# Patient Record
Sex: Female | Born: 1980
Health system: Southern US, Community
[De-identification: ages and names within clinical notes are randomized; demographics above are authoritative.]

## PROBLEM LIST (undated history)

## (undated) ENCOUNTER — Inpatient Hospital Stay (HOSPITAL_COMMUNITY): Payer: Self-pay

## (undated) ENCOUNTER — Emergency Department (HOSPITAL_COMMUNITY): Admission: EM | Payer: Medicaid Other | Source: Home / Self Care

## (undated) DIAGNOSIS — F32A Depression, unspecified: Secondary | ICD-10-CM

## (undated) DIAGNOSIS — M654 Radial styloid tenosynovitis [de Quervain]: Secondary | ICD-10-CM

## (undated) DIAGNOSIS — S61219A Laceration without foreign body of unspecified finger without damage to nail, initial encounter: Secondary | ICD-10-CM

## (undated) DIAGNOSIS — E039 Hypothyroidism, unspecified: Secondary | ICD-10-CM

## (undated) DIAGNOSIS — G4700 Insomnia, unspecified: Secondary | ICD-10-CM

## (undated) DIAGNOSIS — J069 Acute upper respiratory infection, unspecified: Secondary | ICD-10-CM

## (undated) DIAGNOSIS — J45909 Unspecified asthma, uncomplicated: Secondary | ICD-10-CM

## (undated) DIAGNOSIS — B019 Varicella without complication: Secondary | ICD-10-CM

## (undated) DIAGNOSIS — E559 Vitamin D deficiency, unspecified: Secondary | ICD-10-CM

## (undated) DIAGNOSIS — D649 Anemia, unspecified: Secondary | ICD-10-CM

## (undated) DIAGNOSIS — J339 Nasal polyp, unspecified: Secondary | ICD-10-CM

## (undated) DIAGNOSIS — O009 Unspecified ectopic pregnancy without intrauterine pregnancy: Secondary | ICD-10-CM

## (undated) DIAGNOSIS — G5600 Carpal tunnel syndrome, unspecified upper limb: Secondary | ICD-10-CM

## (undated) DIAGNOSIS — J189 Pneumonia, unspecified organism: Secondary | ICD-10-CM

## (undated) DIAGNOSIS — I509 Heart failure, unspecified: Secondary | ICD-10-CM

## (undated) DIAGNOSIS — O139 Gestational [pregnancy-induced] hypertension without significant proteinuria, unspecified trimester: Secondary | ICD-10-CM

## (undated) DIAGNOSIS — O1495 Unspecified pre-eclampsia, complicating the puerperium: Secondary | ICD-10-CM

## (undated) DIAGNOSIS — F419 Anxiety disorder, unspecified: Secondary | ICD-10-CM

## (undated) DIAGNOSIS — L509 Urticaria, unspecified: Secondary | ICD-10-CM

## (undated) DIAGNOSIS — F988 Other specified behavioral and emotional disorders with onset usually occurring in childhood and adolescence: Secondary | ICD-10-CM

## (undated) DIAGNOSIS — G43109 Migraine with aura, not intractable, without status migrainosus: Secondary | ICD-10-CM

## (undated) DIAGNOSIS — F329 Major depressive disorder, single episode, unspecified: Secondary | ICD-10-CM

## (undated) DIAGNOSIS — E038 Other specified hypothyroidism: Secondary | ICD-10-CM

## (undated) DIAGNOSIS — J811 Chronic pulmonary edema: Secondary | ICD-10-CM

## (undated) DIAGNOSIS — Z5189 Encounter for other specified aftercare: Secondary | ICD-10-CM

## (undated) DIAGNOSIS — O26899 Other specified pregnancy related conditions, unspecified trimester: Secondary | ICD-10-CM

## (undated) DIAGNOSIS — M5430 Sciatica, unspecified side: Secondary | ICD-10-CM

## (undated) HISTORY — DX: Other specified pregnancy related conditions, unspecified trimester: O26.899

## (undated) HISTORY — DX: Hypothyroidism, unspecified: E03.9

## (undated) HISTORY — DX: Urticaria, unspecified: L50.9

## (undated) HISTORY — PX: BREAST BIOPSY: SHX20

## (undated) HISTORY — DX: Major depressive disorder, single episode, unspecified: F32.9

## (undated) HISTORY — DX: Carpal tunnel syndrome, unspecified upper limb: G56.00

## (undated) HISTORY — PX: OTHER SURGICAL HISTORY: SHX169

## (undated) HISTORY — DX: Insomnia, unspecified: G47.00

## (undated) HISTORY — DX: Nasal polyp, unspecified: J33.9

## (undated) HISTORY — DX: Encounter for other specified aftercare: Z51.89

## (undated) HISTORY — DX: Acute upper respiratory infection, unspecified: J06.9

## (undated) HISTORY — DX: Vitamin D deficiency, unspecified: E55.9

## (undated) HISTORY — DX: Depression, unspecified: F32.A

## (undated) HISTORY — DX: Other specified hypothyroidism: E03.8

## (undated) HISTORY — DX: Varicella without complication: B01.9

## (undated) HISTORY — DX: Gestational (pregnancy-induced) hypertension without significant proteinuria, unspecified trimester: O13.9

## (undated) HISTORY — DX: Radial styloid tenosynovitis (de quervain): M65.4

## (undated) HISTORY — PX: DILATION AND CURETTAGE OF UTERUS: SHX78

## (undated) HISTORY — DX: Migraine with aura, not intractable, without status migrainosus: G43.109

---

## 2011-07-16 DIAGNOSIS — N644 Mastodynia: Secondary | ICD-10-CM | POA: Insufficient documentation

## 2011-07-31 DIAGNOSIS — F419 Anxiety disorder, unspecified: Secondary | ICD-10-CM | POA: Insufficient documentation

## 2011-07-31 DIAGNOSIS — E039 Hypothyroidism, unspecified: Secondary | ICD-10-CM | POA: Insufficient documentation

## 2011-07-31 DIAGNOSIS — F32A Depression, unspecified: Secondary | ICD-10-CM | POA: Insufficient documentation

## 2011-07-31 DIAGNOSIS — F329 Major depressive disorder, single episode, unspecified: Secondary | ICD-10-CM | POA: Insufficient documentation

## 2011-11-20 DIAGNOSIS — F909 Attention-deficit hyperactivity disorder, unspecified type: Secondary | ICD-10-CM | POA: Insufficient documentation

## 2011-11-20 DIAGNOSIS — D649 Anemia, unspecified: Secondary | ICD-10-CM | POA: Insufficient documentation

## 2012-08-22 ENCOUNTER — Encounter (HOSPITAL_COMMUNITY): Payer: Self-pay | Admitting: Emergency Medicine

## 2012-08-22 ENCOUNTER — Emergency Department (HOSPITAL_COMMUNITY)
Admission: EM | Admit: 2012-08-22 | Discharge: 2012-08-22 | Disposition: A | Discharge status: Home / Self Care | Payer: Self-pay | Attending: Emergency Medicine | Admitting: Emergency Medicine

## 2012-08-22 DIAGNOSIS — F411 Generalized anxiety disorder: Secondary | ICD-10-CM | POA: Insufficient documentation

## 2012-08-22 DIAGNOSIS — M791 Myalgia, unspecified site: Secondary | ICD-10-CM

## 2012-08-22 DIAGNOSIS — M62838 Other muscle spasm: Secondary | ICD-10-CM | POA: Insufficient documentation

## 2012-08-22 DIAGNOSIS — IMO0001 Reserved for inherently not codable concepts without codable children: Secondary | ICD-10-CM | POA: Insufficient documentation

## 2012-08-22 DIAGNOSIS — Z8659 Personal history of other mental and behavioral disorders: Secondary | ICD-10-CM | POA: Insufficient documentation

## 2012-08-22 DIAGNOSIS — R6884 Jaw pain: Secondary | ICD-10-CM | POA: Insufficient documentation

## 2012-08-22 DIAGNOSIS — R51 Headache: Secondary | ICD-10-CM | POA: Insufficient documentation

## 2012-08-22 DIAGNOSIS — M542 Cervicalgia: Secondary | ICD-10-CM

## 2012-08-22 DIAGNOSIS — M26629 Arthralgia of temporomandibular joint, unspecified side: Secondary | ICD-10-CM

## 2012-08-22 DIAGNOSIS — Z8739 Personal history of other diseases of the musculoskeletal system and connective tissue: Secondary | ICD-10-CM | POA: Insufficient documentation

## 2012-08-22 DIAGNOSIS — Z79899 Other long term (current) drug therapy: Secondary | ICD-10-CM | POA: Insufficient documentation

## 2012-08-22 DIAGNOSIS — M26609 Unspecified temporomandibular joint disorder, unspecified side: Secondary | ICD-10-CM | POA: Insufficient documentation

## 2012-08-22 DIAGNOSIS — M25519 Pain in unspecified shoulder: Secondary | ICD-10-CM | POA: Insufficient documentation

## 2012-08-22 DIAGNOSIS — M25512 Pain in left shoulder: Secondary | ICD-10-CM

## 2012-08-22 DIAGNOSIS — F17200 Nicotine dependence, unspecified, uncomplicated: Secondary | ICD-10-CM | POA: Insufficient documentation

## 2012-08-22 HISTORY — DX: Sciatica, unspecified side: M54.30

## 2012-08-22 HISTORY — DX: Anxiety disorder, unspecified: F41.9

## 2012-08-22 HISTORY — DX: Other specified behavioral and emotional disorders with onset usually occurring in childhood and adolescence: F98.8

## 2012-08-22 MED ORDER — NAPROXEN 500 MG PO TABS: 500.0000 mg | ORAL_TABLET | Freq: Two times a day (BID) | ORAL | Status: DC

## 2012-08-22 MED ORDER — CYCLOBENZAPRINE HCL 10 MG PO TABS: 10.0000 mg | ORAL_TABLET | Freq: Three times a day (TID) | ORAL | Status: DC | PRN

## 2012-08-22 MED ORDER — IBUPROFEN 800 MG PO TABS
800.0000 mg | ORAL_TABLET | Freq: Once | ORAL | Status: AC
  Administered 2012-08-22: 800 mg via ORAL
  Filled 2012-08-22: qty 1

## 2012-08-22 MED ORDER — DIAZEPAM 5 MG PO TABS
5.0000 mg | ORAL_TABLET | Freq: Once | ORAL | Status: AC
  Administered 2012-08-22: 5 mg via ORAL
  Filled 2012-08-22: qty 1

## 2012-08-22 MED ORDER — HYDROCODONE-ACETAMINOPHEN 5-325 MG PO TABS: 1.0000 | ORAL_TABLET | Freq: Four times a day (QID) | ORAL | Status: DC | PRN

## 2012-08-22 MED ORDER — OXYCODONE-ACETAMINOPHEN 5-325 MG PO TABS
1.0000 | ORAL_TABLET | Freq: Once | ORAL | Status: AC
  Administered 2012-08-22: 1 via ORAL
  Filled 2012-08-22: qty 1

## 2012-08-22 NOTE — ED Notes (Addendum)
Reports back of neck was hurting then radiated to left side of face,left jaw-can close left side of mouth due to pain, left back tooth,  left should and arm. Head feels like it weighs 100# points specifically area to back of neck calling it "the ball", painful to turn neck to left side, can not flex neck c/o increase pain hurting "the ball in back of neck"  to   heavy. Thought pain related to exercise doing  abdomine  crunches & lifting weights 3 weeks ago.

## 2012-08-22 NOTE — ED Provider Notes (Signed)
Medical screening examination/treatment/procedure(s) were performed by non-physician practitioner and as supervising physician I was immediately available for consultation/collaboration.  Doug Sou, MD 08/22/12 1710

## 2012-08-22 NOTE — ED Provider Notes (Signed)
History     CSN: [id number]  Arrival date & time 08/22/12  1449   First MD Initiated Contact with Patient 08/22/12 1531      Chief Complaint  Patient presents with  . Neck Pain  . Arm Pain  . Jaw Pain  . Headache    (Consider location/radiation/quality/duration/timing/severity/associated sxs/prior treatment) HPI Comments: Esma Kilts is a 32 y.o. female with a history of anxiety presents emergency department complaining of left neck, shoulder, back and jaw pain.  Onset of symptoms began approximately 2-3 weeks ago and has been gradually worsening.  Patient states that she recently just started working out and did a series of chest and back exercises prior to the onset of her presentation.  Patient denies any dental pain, fevers, night sweats, chills, nausea, vomiting, dizziness, diaphoresis, chest pain, shortness of breath or difficulty breathing, numbness, tingling or weakness of extremities, recent travel, or  Rash.   Patient is a 32 y.o. female presenting with neck pain, arm pain, and headaches. The history is provided by the patient.  Neck Pain Associated symptoms: headaches   Associated symptoms: no chest pain, no fever, no numbness and no weakness   Arm Pain Associated symptoms include headaches, myalgias and neck pain. Pertinent negatives include no chest pain, chills, congestion, coughing, diaphoresis, fever, numbness or weakness.  Headache Associated symptoms: myalgias and neck pain   Associated symptoms: no congestion, no cough, no dizziness, no fever and no numbness     Past Medical History  Diagnosis Date  . Anxiety   . ADD (attention deficit disorder)   . Sciatic pain     History reviewed. No pertinent past surgical history.  History reviewed. No pertinent family history.  History  Substance Use Topics  . Smoking status: Current Some Day Smoker    Types: Cigarettes  . Smokeless tobacco: Not on file  . Alcohol Use: Yes    OB History   Grav Para Term  Preterm Abortions TAB SAB Ect Mult Living                  Review of Systems  Constitutional: Negative for fever, chills, diaphoresis, activity change and appetite change.  HENT: Positive for neck pain. Negative for congestion.   Eyes: Negative for visual disturbance.  Respiratory: Negative for cough and shortness of breath.   Cardiovascular: Negative for chest pain and leg swelling.  Genitourinary: Negative for dysuria, urgency and frequency.  Musculoskeletal: Positive for myalgias.  Skin: Negative for color change and wound.  Neurological: Positive for headaches. Negative for dizziness, syncope, weakness, light-headedness and numbness.  Psychiatric/Behavioral: Negative for confusion.  All other systems reviewed and are negative.    Allergies  Penicillins and Sulfa antibiotics  Home Medications   Current Outpatient Rx  Name  Route  Sig  Dispense  Refill  . ALPRAZolam (XANAX) 1 MG tablet   Oral   Take 0.5-1 mg by mouth 3 (three) times daily as needed for anxiety.         . Aspirin-Salicylamide-Caffeine (BC HEADACHE POWDER PO)   Oral   Take 1 packet by mouth daily as needed (for pain.).         Marland Kitchen omega-3 acid ethyl esters (LOVAZA) 1 G capsule   Oral   Take 2 g by mouth daily.         . traZODone (DESYREL) 150 MG tablet   Oral   Take 300 mg by mouth at bedtime.  BP 122/73  Pulse 77  Temp(Src) 98.1 F (36.7 C) (Oral)  Resp 16  SpO2 95%  LMP 08/08/2012  Physical Exam  Nursing note and vitals reviewed. Constitutional: She appears well-developed and well-nourished. No distress.  HENT:  Head: Normocephalic and atraumatic.  Opens and closes mouth without difficulty, mild left sided TMJ ttp, Normal dentition  Eyes: Conjunctivae and EOM are normal.  Neck: Normal range of motion. Neck supple.    No nuchal rigidity or spinous process tenderness  Cardiovascular:  Intact distal pulses, capillary refill < 3 seconds  Musculoskeletal:       Left  shoulder: She exhibits tenderness and spasm. She exhibits no bony tenderness, no crepitus and normal strength.       Arms: All other extremities with normal ROM  Neurological:  CN II-XII intact w good coordination. Strength equal bilaterally. No sensory deficit.   Skin: She is not diaphoretic.  Skin intact, no tenting    ED Course  Procedures (including critical care time)  Labs Reviewed - No data to display No results found.   No diagnosis found.    MDM  Patient reports emergency department complaining of musculoskeletal pain of the left neck shoulder or and scapular region extending to left TMJ.  Onset of symptoms occurred status post weightlifting. Exam consistent w significant muscular tenderness.  Pt denies any fevers or recent sickness. Presentation non concerning for closed head injury or meningitis. Strict return precautions discussed. Explained conservative home therapies and advised PCP follow up.         Jaci Carrel, [place] 08/22/12 1623

## 2012-08-22 NOTE — ED Notes (Addendum)
Patient reports left neck pain since Wednesday.  Patient reports left shoulder pain, left-sided headache, and left jaw pain since last night.  Patient denies chest pain, fevers, N/V, diaphoresis, dizziness.

## 2014-12-29 ENCOUNTER — Encounter: Payer: Self-pay | Admitting: Family

## 2014-12-29 ENCOUNTER — Other Ambulatory Visit (INDEPENDENT_AMBULATORY_CARE_PROVIDER_SITE_OTHER): Payer: 59

## 2014-12-29 ENCOUNTER — Ambulatory Visit (INDEPENDENT_AMBULATORY_CARE_PROVIDER_SITE_OTHER): Payer: 59 | Admitting: Family

## 2014-12-29 VITALS — BP 122/84 | HR 102 | Temp 98.0°F | Resp 18 | Ht 63.0 in | Wt 147.8 lb

## 2014-12-29 DIAGNOSIS — E559 Vitamin D deficiency, unspecified: Secondary | ICD-10-CM | POA: Insufficient documentation

## 2014-12-29 DIAGNOSIS — F411 Generalized anxiety disorder: Secondary | ICD-10-CM

## 2014-12-29 DIAGNOSIS — N926 Irregular menstruation, unspecified: Secondary | ICD-10-CM | POA: Diagnosis not present

## 2014-12-29 DIAGNOSIS — F909 Attention-deficit hyperactivity disorder, unspecified type: Secondary | ICD-10-CM

## 2014-12-29 DIAGNOSIS — J339 Nasal polyp, unspecified: Secondary | ICD-10-CM | POA: Diagnosis not present

## 2014-12-29 DIAGNOSIS — F988 Other specified behavioral and emotional disorders with onset usually occurring in childhood and adolescence: Secondary | ICD-10-CM | POA: Insufficient documentation

## 2014-12-29 LAB — CBC
HCT: 37.5 % (ref 36.0–46.0)
HEMOGLOBIN: 12.9 g/dL (ref 12.0–15.0)
MCHC: 34.2 g/dL (ref 30.0–36.0)
MCV: 85.7 fl (ref 78.0–100.0)
Platelets: 343 10*3/uL (ref 150.0–400.0)
RBC: 4.38 Mil/uL (ref 3.87–5.11)
RDW: 13.6 % (ref 11.5–15.5)
WBC: 9.1 10*3/uL (ref 4.0–10.5)

## 2014-12-29 LAB — IBC PANEL
IRON: 110 ug/dL (ref 42–145)
Saturation Ratios: 26.4 % (ref 20.0–50.0)
Transferrin: 298 mg/dL (ref 212.0–360.0)

## 2014-12-29 LAB — TESTOSTERONE: TESTOSTERONE: 23.57 ng/dL (ref 15.00–40.00)

## 2014-12-29 MED ORDER — AMPHETAMINE-DEXTROAMPHETAMINE 30 MG PO TABS: 15.0000 mg | ORAL_TABLET | Freq: Three times a day (TID) | ORAL | Status: DC

## 2014-12-29 MED ORDER — OMEPRAZOLE 20 MG PO CPDR: 20.0000 mg | DELAYED_RELEASE_CAPSULE | Freq: Every day | ORAL | Status: DC

## 2014-12-29 MED ORDER — ALPRAZOLAM 1 MG PO TABS: 0.5000 mg | ORAL_TABLET | Freq: Three times a day (TID) | ORAL | Status: DC | PRN

## 2014-12-29 NOTE — Assessment & Plan Note (Signed)
Stable with current dosage of Adderall. Takes the medication as prescribed and denies adverse side effects. Continue current dosage of Adderall 15 mg 3 times daily. Follow-up in one month. [place] controlled substance database checked in prescription dated accordingly. Controlled substance contract signed and urine drug screen completed.

## 2014-12-29 NOTE — Progress Notes (Signed)
Subjective:    Patient ID: Cynthia Townsend, female    DOB: [birth year], 34 y.o.   MRN: [id number]  Chief Complaint  Patient presents with  . Establish Care    has been dx with bilateral nose polyps that she would like checked out, she feels like she is getting fluid in her ears, also was told to get checked for PCOS, vitamin D defiencency?    HPI:  Cynthia Townsend is a 34 y.o. female with a PMH of anxiety, depression, attention deficit disorder, insomnia, nasal polyps, and thyroid disease who presents today for an office visit to establish care.   1.) Nasal polyps - previously diagnosed with nasal polyps about 1 year ago and has tried the modifying factor of flonase which did not help. Associated symptoms of congestion and ear pain. Course of the polyps has stayed about the same.   2.) PCOS - Associated symptoms of irregular period and severe cramps which is worse around the first 2 days of her menstrual cycle. Also notes Denies hirtuism. Reports previous A1c and TSH were normal. Was scheduled to get an ultrasound, however, secondary to insurance was unable to complete.   3.) Vitamin D Deficiency - previously diagnosed with Vitamin D deficiency and was previously taking 2000 mg daily which she has currently stopped. Questioning if she needs to continue taking the supplements.   4.) ADD - Stable with current dosage of Adderall. Takes medication as prescribed and denies adverse effects of medicaiton. Reports sleeping okay with trazadone. Denies changes in weight or appetite. Denies chest pain, shortness of breath, or heart palpitations.  5.) Anxiety - stable with current dose of Xanax. Takes medications prescribed and denies adverse side effects. Requesting medication refill.  Allergies  Allergen Reactions  . Penicillins Shortness Of Breath and Swelling  . Sulfa Antibiotics Hives     Outpatient Prescriptions Prior to Visit  Medication Sig Dispense Refill  . Aspirin-Salicylamide-Caffeine  (BC HEADACHE POWDER PO) Take 1 packet by mouth daily as needed (for pain.).    Marland Kitchen traZODone (DESYREL) 150 MG tablet Take 300 mg by mouth at bedtime.    . ALPRAZolam (XANAX) 1 MG tablet Take 0.5-1 mg by mouth 3 (three) times daily as needed for anxiety.    . cyclobenzaprine (FLEXERIL) 10 MG tablet Take 1 tablet (10 mg total) by mouth 3 (three) times daily as needed for muscle spasms. 30 tablet 0  . HYDROcodone-acetaminophen (NORCO/VICODIN) 5-325 MG per tablet Take 1 tablet by mouth every 6 (six) hours as needed for pain. 15 tablet 0  . naproxen (NAPROSYN) 500 MG tablet Take 1 tablet (500 mg total) by mouth 2 (two) times daily. 30 tablet 0  . omega-3 acid ethyl esters (LOVAZA) 1 G capsule Take 2 g by mouth daily.     No facility-administered medications prior to visit.     Past Medical History  Diagnosis Date  . Anxiety   . ADD (attention deficit disorder)   . Sciatic pain   . Chicken pox   . Depression   . Subclinical hypothyroidism   . Insomnia   . Vitamin D deficiency   . Nasal polyps      Past Surgical History  Procedure Laterality Date  . Breast biopsy      benign     Family History  Problem Relation Age of Onset  . Alcohol abuse Mother   . Lung cancer Mother   . Hyperlipidemia Mother   . Heart disease Mother   . Mental illness Mother   .  Hyperthyroidism Mother   . Hyperlipidemia Father   . Heart disease Father   . Transient ischemic attack Father   . Heart disease Sister   . Mental illness Sister   . Heart disease Maternal Uncle   . Rheum arthritis Maternal Grandmother      History   Social History  . Marital Status: Single    Spouse Name: N/A  . Number of Children: 0  . Years of Education: 16   Occupational History  . 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 Smoking status: Former Smoker    Types: Cigarettes  . Smokeless tobacco: Never Used  . Alcohol Use: 0.6 oz/week    1 Shots of liquor per week  . Drug Use: No  . Sexual Activity:  Yes    Birth Control/ Protection: None   Other Topics Concern  . Not on file   Social History Narrative   Fun: Hiking, Zumba, sleep   Denies religious beliefs effecting health care.   Denies abuse and feels safe at home.       Review of Systems  Constitutional: Negative for fever, chills, activity change, appetite change and unexpected weight change.  Respiratory: Negative for chest tightness.   Cardiovascular: Negative for chest pain, palpitations and leg swelling.  Endocrine: Negative for cold intolerance and heat intolerance.  Genitourinary: Positive for menstrual problem.  Neurological: Negative for headaches.  Psychiatric/Behavioral: Negative for sleep disturbance and decreased concentration.      Objective:    BP 122/84 mmHg  Pulse 102  Temp(Src) 98 F (36.7 C) (Oral)  Resp 18  Ht  (1.6 m)  Wt 147 lb 12.8 oz (67.042 kg)  BMI 26.19 kg/m2  SpO2 97% Nursing note and vital signs reviewed.  Physical Exam  Constitutional: She is oriented to person, place, and time. She appears well-developed and well-nourished. No distress.  HENT:  Nose: Nose normal. No nasal deformity or septal deviation.  Cardiovascular: Normal rate, regular rhythm, normal heart sounds and intact distal pulses.   Pulmonary/Chest: Effort normal and breath sounds normal.  Neurological: She is alert and oriented to person, place, and time.  Skin: Skin is warm and dry.  Psychiatric: She has a normal mood and affect. Her behavior is normal. Judgment and thought content normal.       Assessment & Plan:   Problem List Items Addressed This Visit      Other   Irregular menstrual cycle - Primary    Patient with irregular menstrual cycle and previously began workup for her polycystic ovarian syndrome. Indicates her A1c and thyroid were both within normal limit, and was scheduled for an ultrasound which was never completed secondary to insurance. Obtain testosterone, iron panel, and CBC. Refer to  gynecology for ultrasound and follow-up of potential PCOS and menorrhagia.      Relevant Orders   Ambulatory referral to Gynecology   Testosterone (Completed)   CBC (Completed)   IBC panel (Completed)   Nasal polyps    Mild inflammation noted. Treat conservatively with over-the-counter medications including Sudafed and a second generation antihistamine. Discussed potential for reflux causing her congestion. Start omeprazole to rule out reflux. May trial alternative nasal corticosteroid if not controlled with over-the-counter medications. If symptoms fail to improve with current treatment plan referred to ear nose and throat.      Relevant Medications   omeprazole (PRILOSEC) 20 MG capsule   ADD (attention deficit disorder)    Stable with current  dosage of Adderall. Takes the medication as prescribed and denies adverse side effects. Continue current dosage of Adderall 15 mg 3 times daily. Follow-up in one month. [place] controlled substance database checked in prescription dated accordingly. Controlled substance contract signed and urine drug screen completed.      Relevant Medications   amphetamine-dextroamphetamine (ADDERALL) 30 MG tablet   Vitamin D deficiency    Previous history of vitamin D deficiency and taking oral vitamin D. Obtain vitamin D level to determine current status and need for supplementation.      Relevant Orders   Vitamin D 1,25 dihydroxy   Generalized anxiety disorder    Stable with current dose of Xanax and reports the medication helps. Denies adverse side effects. Continue current dosage of Xanax 0.5 mg 3 times daily as needed.      Relevant Medications   ALPRAZolam (XANAX) 1 MG tablet

## 2014-12-29 NOTE — Assessment & Plan Note (Signed)
Stable with current dose of Xanax and reports the medication helps. Denies adverse side effects. Continue current dosage of Xanax 0.5 mg 3 times daily as needed.

## 2014-12-29 NOTE — Assessment & Plan Note (Signed)
Mild inflammation noted. Treat conservatively with over-the-counter medications including Sudafed and a second generation antihistamine. Discussed potential for reflux causing her congestion. Start omeprazole to rule out reflux. May trial alternative nasal corticosteroid if not controlled with over-the-counter medications. If symptoms fail to improve with current treatment plan referred to ear nose and throat.

## 2014-12-29 NOTE — Progress Notes (Signed)
Pre visit review using our clinic review tool, if applicable. No additional management support is needed unless otherwise documented below in the visit note. 

## 2014-12-29 NOTE — Assessment & Plan Note (Signed)
Patient with irregular menstrual cycle and previously began workup for her polycystic ovarian syndrome. Indicates her A1c and thyroid were both within normal limit, and was scheduled for an ultrasound which was never completed secondary to insurance. Obtain testosterone, iron panel, and CBC. Refer to gynecology for ultrasound and follow-up of potential PCOS and menorrhagia.

## 2014-12-29 NOTE — Patient Instructions (Addendum)
Thank you for choosing [facility].  Summary/Instructions:  Your prescription(s) have been submitted to your pharmacy or been printed and provided for you. Please take as directed and contact our office if you believe you are having problem(s) with the medication(s) or have any questions.  Please stop by the lab on the basement level of the building for your blood work. Your results will be released to MyChart (or called to you) after review, usually within 72 hours after test completion. If any changes need to be made, you will be notified at that same time.  Referrals have been made during this visit. You should expect to hear back from our schedulers in about 7-10 days in regards to establishing an appointment with the specialists we discussed.   If your symptoms worsen or fail to improve, please contact our office for further instruction, or in case of emergency go directly to the emergency room at the closest medical facility.   Polycystic Ovarian Syndrome Polycystic ovarian syndrome (PCOS) is a common hormonal disorder among women of reproductive age. Most women with PCOS grow many small cysts on their ovaries. PCOS can cause problems with your periods and make it difficult to get pregnant. It can also cause an increased risk of miscarriage with pregnancy. If left untreated, PCOS can lead to serious health problems, such as diabetes and heart disease. CAUSES The cause of PCOS is not fully understood, but genetics may be a factor. SIGNS AND SYMPTOMS   Infrequent or no menstrual periods.   Inability to get pregnant (infertility) because of not ovulating.   Increased growth of hair on the face, chest, stomach, back, thumbs, thighs, or toes.   Acne, oily skin, or dandruff.   Pelvic pain.   Weight gain or obesity, usually carrying extra weight around the waist.   Type 2 diabetes.   High cholesterol.   High blood pressure.   Female-pattern baldness or thinning  hair.   Patches of thickened and dark brown or black skin on the neck, arms, breasts, or thighs.   Tiny excess flaps of skin (skin tags) in the armpits or neck area.   Excessive snoring and having breathing stop at times while asleep (sleep apnea).   Deepening of the voice.   Gestational diabetes when pregnant.  DIAGNOSIS  There is no single test to diagnose PCOS.   Your health care provider will:   Take a medical history.   Perform a pelvic exam.   Have ultrasonography done.   Check your female and female hormone levels.   Measure glucose or sugar levels in the blood.   Do other blood tests.   If you are producing too many female hormones, your health care provider will make sure it is from PCOS. At the physical exam, your health care provider will want to evaluate the areas of increased hair growth. Try to allow natural hair growth for a few days before the visit.   During a pelvic exam, the ovaries may be enlarged or swollen because of the increased number of small cysts. This can be seen more easily by using vaginal ultrasonography or screening to examine the ovaries and lining of the uterus (endometrium) for cysts. The uterine lining may become thicker if you have not been having a regular period.  TREATMENT  Because there is no cure for PCOS, it needs to be managed to prevent problems. Treatments are based on your symptoms. Treatment is also based on whether you want to have a baby or  whether you need contraception.  Treatment may include:   Progesterone hormone to start a menstrual period.   Birth control pills to make you have regular menstrual periods.   Medicines to make you ovulate, if you want to get pregnant.   Medicines to control your insulin.   Medicine to control your blood pressure.   Medicine and diet to control your high cholesterol and triglycerides in your blood.  Medicine to reduce excessive hair growth.  Surgery, making small  holes in the ovary, to decrease the amount of female hormone production. This is done through a long, lighted tube (laparoscope) placed into the pelvis through a tiny incision in the lower abdomen.  HOME CARE INSTRUCTIONS  Only take over-the-counter or prescription medicine as directed by your health care provider.  Pay attention to the foods you eat and your activity levels. This can help reduce the effects of PCOS.  Keep your weight under control.  Eat foods that are low in carbohydrate and high in fiber.  Exercise regularly. SEEK MEDICAL CARE IF:  Your symptoms do not get better with medicine.  You have new symptoms. Document Released: 09/20/2004 Document Revised: 03/17/2013 Document Reviewed: 11/12/2012 [facility] Patient Information 2015 [place], [place]. This information is not intended to replace advice given to you by your health care provider. Make sure you discuss any questions you have with your health care provider.

## 2014-12-29 NOTE — Assessment & Plan Note (Signed)
Previous history of vitamin D deficiency and taking oral vitamin D. Obtain vitamin D level to determine current status and need for supplementation.

## 2014-12-30 ENCOUNTER — Ambulatory Visit: Payer: Self-pay | Admitting: Family

## 2014-12-30 ENCOUNTER — Telehealth: Payer: Self-pay | Admitting: Family

## 2014-12-30 NOTE — Telephone Encounter (Signed)
Received records from [facility] forwarded to Dr. Carver Fila 56 pages [id number].

## 2015-01-01 LAB — VITAMIN D 1,25 DIHYDROXY
VITAMIN D 1, 25 (OH) TOTAL: 27 pg/mL (ref 18–72)
VITAMIN D3 1, 25 (OH): 27 pg/mL
Vitamin D2 1, 25 (OH)2: <8 pg/mL

## 2015-01-02 ENCOUNTER — Telehealth: Payer: Self-pay | Admitting: Family

## 2015-01-02 NOTE — Telephone Encounter (Signed)
Please inform patient that her lab work shows that her vitamin D, iron level, white/red blood cells, and testosterone are all within normal limits. Please ensure she has a follow-up with gynecology for an ultrasound to determine if she has PCOS.

## 2015-01-03 NOTE — Telephone Encounter (Signed)
LVM for pt to call back.

## 2015-01-03 NOTE — Telephone Encounter (Signed)
Pt aware of results 

## 2015-01-12 ENCOUNTER — Telehealth: Payer: Self-pay | Admitting: Family

## 2015-01-12 MED ORDER — TRAZODONE HCL 150 MG PO TABS: 300.0000 mg | ORAL_TABLET | Freq: Every day | ORAL | Status: DC

## 2015-01-12 NOTE — Telephone Encounter (Signed)
Patient needs trazodone sent to [facility] outpatient pharmacy

## 2015-01-12 NOTE — Telephone Encounter (Signed)
Trazodone refill completed as it is not a controlled substance. Please inform patient however that we will not be able to refill her Xanax or Adderall secondary to the positive drug test. We are more than happy to take care of her other primary care needs.

## 2015-01-12 NOTE — Telephone Encounter (Signed)
Pts drug test came back positive for THC and morphine.

## 2015-01-13 MED ORDER — TRAZODONE HCL 150 MG PO TABS: 300.0000 mg | ORAL_TABLET | Freq: Every day | ORAL | Status: DC

## 2015-01-13 NOTE — Telephone Encounter (Signed)
Patient states she admits to taking the [facility] but she states she should not have tested positive for morphine.  Patient is requesting to test again.  Did inform patient that Trazodone will get resent to [facility] outpatient.

## 2015-01-16 NOTE — Telephone Encounter (Signed)
That is fine. We will continue to prescribe at this point, however she will need drug tests for refills.

## 2015-01-17 ENCOUNTER — Telehealth: Payer: Self-pay | Admitting: Family

## 2015-01-17 NOTE — Telephone Encounter (Signed)
Patient seen greg as a new patient the other day and she was referred to [facility] and has an appt there on 8/25. She just found out she is pregnant and is wondering if she should try to get into there sooner and should she keep taking her meds. Please call her

## 2015-01-17 NOTE — Telephone Encounter (Signed)
Please inform patient that she needs to stop taking the Adderall and Xanax and start taking a pre-natal vitamin if she has not already done so. She should be fine to follow up with [facility] on 8/25 as a new OB appointment.

## 2015-01-18 NOTE — Telephone Encounter (Signed)
Cynthia Townsend spoke with pt.

## 2015-01-18 NOTE — Telephone Encounter (Signed)
Left voicemail to return call. 

## 2015-02-15 LAB — OB RESULTS CONSOLE GC/CHLAMYDIA
CHLAMYDIA, DNA PROBE: NEGATIVE
Gonorrhea: NEGATIVE

## 2015-02-15 LAB — OB RESULTS CONSOLE ANTIBODY SCREEN: ANTIBODY SCREEN: NEGATIVE

## 2015-02-15 LAB — OB RESULTS CONSOLE ABO/RH: RH Type: NEGATIVE

## 2015-02-15 LAB — OB RESULTS CONSOLE HEPATITIS B SURFACE ANTIGEN: HEP B S AG: NEGATIVE

## 2015-02-15 LAB — OB RESULTS CONSOLE HIV ANTIBODY (ROUTINE TESTING): HIV: NONREACTIVE

## 2015-02-15 LAB — OB RESULTS CONSOLE RUBELLA ANTIBODY, IGM: RUBELLA: IMMUNE

## 2015-02-15 LAB — OB RESULTS CONSOLE RPR: RPR: NONREACTIVE

## 2015-03-30 ENCOUNTER — Ambulatory Visit: Payer: Self-pay | Admitting: Family Medicine

## 2015-04-06 ENCOUNTER — Encounter: Payer: Self-pay | Admitting: Family

## 2015-04-06 ENCOUNTER — Ambulatory Visit (INDEPENDENT_AMBULATORY_CARE_PROVIDER_SITE_OTHER): Payer: 59 | Admitting: Family

## 2015-04-06 VITALS — BP 122/80 | HR 95 | Temp 98.3°F | Ht 63.0 in | Wt 182.8 lb

## 2015-04-06 DIAGNOSIS — M659 Synovitis and tenosynovitis, unspecified: Secondary | ICD-10-CM

## 2015-04-06 DIAGNOSIS — M6588 Other synovitis and tenosynovitis, other site: Secondary | ICD-10-CM | POA: Diagnosis not present

## 2015-04-06 NOTE — Progress Notes (Signed)
Subjective:    Patient ID: Cynthia Townsend, female    DOB: 05/25/1981, 34 y.o.   MRN: [id number]  Chief Complaint  Patient presents with  . Joint Pain    Bilateral thumbs    HPI:  Cynthia Townsend is a 34 y.o. female who  has a past medical history of Anxiety; ADD (attention deficit disorder); Sciatic pain; Chicken pox; Depression; Subclinical hypothyroidism; Insomnia; Vitamin D deficiency; and Nasal polyps. and presents today for an acute office visit.   This is a new problem. Associated symptom of pain located in her bilateral wrists that has been going on for about 2 weeks and she has also had carpal tunnel issues that has been going on for about 1 month. Modifying factors include cock up wrist splints which help with the numbness and tingling. Pain is described as sharp and very achy. Denies any trauma or sounds/sesnations heard or felt. Currently about 4 months pregnant.  Allergies  Allergen Reactions  . Penicillins Shortness Of Breath and Swelling  . Sulfa Antibiotics Hives     Current Outpatient Prescriptions on File Prior to Visit  Medication Sig Dispense Refill  . Acetaminophen-Caff-Pyrilamine (MIDOL COMPLETE PO) Take by mouth as needed.    . ALPRAZolam (XANAX) 1 MG tablet Take 0.5 tablets (0.5 mg total) by mouth 3 (three) times daily as needed for anxiety. (Patient not taking: Reported on 04/06/2015) 45 tablet 0  . amphetamine-dextroamphetamine (ADDERALL) 30 MG tablet Take 0.5 tablets by mouth 3 (three) times daily. (Patient not taking: Reported on 04/06/2015) 45 tablet 0  . Aspirin-Salicylamide-Caffeine (BC HEADACHE POWDER PO) Take 1 packet by mouth daily as needed (for pain.).    Marland Kitchen bismuth subsalicylate (PEPTO BISMOL) 262 MG/15ML suspension Take 30 mLs by mouth as needed.    Marland Kitchen omeprazole (PRILOSEC) 20 MG capsule Take 1 capsule (20 mg total) by mouth daily. (Patient not taking: Reported on 04/06/2015) 14 capsule 0  . traZODone (DESYREL) 150 MG tablet Take 2 tablets (300 mg  total) by mouth at bedtime. (Patient not taking: Reported on 04/06/2015) 60 tablet 1   No current facility-administered medications on file prior to visit.    Past Surgical History  Procedure Laterality Date  . Breast biopsy      benign    Review of Systems  Constitutional: Negative for fever and chills.  Musculoskeletal:       Positive for pain.       Objective:    BP 122/80 mmHg  Pulse 95  Temp(Src) 98.3 F (36.8 C) (Oral)  Ht  (1.6 m)  Wt 182 lb 12 oz (82.895 kg)  BMI 32.38 kg/m2  SpO2 96% Nursing note and vital signs reviewed.  Physical Exam  Constitutional: She is oriented to person, place, and time. She appears well-developed and well-nourished. No distress.  Cardiovascular: Normal rate, regular rhythm, normal heart sounds and intact distal pulses.   Pulmonary/Chest: Effort normal and breath sounds normal.  Musculoskeletal:  Bilateral thumbs - no obvious deformity or discoloration noted. Mild edema. Range of motion is within the normal ranges with discomfort noted with thumb extension. Finkelstein's is positive bilaterally. Pulses are bounding and capillary is intact and appropriate.  Neurological: She is alert and oriented to person, place, and time.  Skin: Skin is warm and dry.  Psychiatric: She has a normal mood and affect. Her behavior is normal. Judgment and thought content normal.       Assessment & Plan:   Problem List Items Addressed This Visit  Musculoskeletal and Integument   Tenosynovitis of thumb - Primary     Symptoms and exam consistent with bilateral tenosynovitis secondary to fluid retention during pregnancy. Recommend thumb spica splints, ice massage, and Tylenol as needed for discomfort.  If symptoms worsen or fail to improve, consider referral to occupational therapy for treatment.

## 2015-04-06 NOTE — Assessment & Plan Note (Signed)
Symptoms and exam consistent with bilateral tenosynovitis secondary to fluid retention during pregnancy. Recommend thumb spica splints, ice massage, and Tylenol as needed for discomfort.  If symptoms worsen or fail to improve, consider referral to occupational therapy for treatment.

## 2015-04-06 NOTE — Patient Instructions (Signed)
Thank you for choosing [facility].  Summary/Instructions:     Recommend a thumb spica splint. Ice massage as needed.   If your symptoms worsen or fail to improve, please contact our office for further instruction, or in case of emergency go directly to the emergency room at the closest medical facility.

## 2015-04-26 ENCOUNTER — Emergency Department (HOSPITAL_COMMUNITY)
Admission: EM | Admit: 2015-04-26 | Discharge: 2015-04-26 | Disposition: A | Discharge status: Home / Self Care | Payer: 59 | Attending: Emergency Medicine | Admitting: Emergency Medicine

## 2015-04-26 ENCOUNTER — Encounter (HOSPITAL_COMMUNITY): Payer: Self-pay | Admitting: Emergency Medicine

## 2015-04-26 DIAGNOSIS — R109 Unspecified abdominal pain: Secondary | ICD-10-CM

## 2015-04-26 DIAGNOSIS — N899 Noninflammatory disorder of vagina, unspecified: Secondary | ICD-10-CM | POA: Insufficient documentation

## 2015-04-26 DIAGNOSIS — Y9289 Other specified places as the place of occurrence of the external cause: Secondary | ICD-10-CM | POA: Diagnosis not present

## 2015-04-26 DIAGNOSIS — Z8709 Personal history of other diseases of the respiratory system: Secondary | ICD-10-CM | POA: Diagnosis not present

## 2015-04-26 DIAGNOSIS — O9989 Other specified diseases and conditions complicating pregnancy, childbirth and the puerperium: Secondary | ICD-10-CM | POA: Insufficient documentation

## 2015-04-26 DIAGNOSIS — O26899 Other specified pregnancy related conditions, unspecified trimester: Secondary | ICD-10-CM

## 2015-04-26 DIAGNOSIS — G4700 Insomnia, unspecified: Secondary | ICD-10-CM | POA: Diagnosis not present

## 2015-04-26 DIAGNOSIS — F419 Anxiety disorder, unspecified: Secondary | ICD-10-CM | POA: Diagnosis not present

## 2015-04-26 DIAGNOSIS — Y998 Other external cause status: Secondary | ICD-10-CM | POA: Insufficient documentation

## 2015-04-26 DIAGNOSIS — O99342 Other mental disorders complicating pregnancy, second trimester: Secondary | ICD-10-CM | POA: Insufficient documentation

## 2015-04-26 DIAGNOSIS — R1012 Left upper quadrant pain: Secondary | ICD-10-CM | POA: Insufficient documentation

## 2015-04-26 DIAGNOSIS — W57XXXA Bitten or stung by nonvenomous insect and other nonvenomous arthropods, initial encounter: Secondary | ICD-10-CM | POA: Insufficient documentation

## 2015-04-26 DIAGNOSIS — Z3A20 20 weeks gestation of pregnancy: Secondary | ICD-10-CM | POA: Diagnosis not present

## 2015-04-26 DIAGNOSIS — R61 Generalized hyperhidrosis: Secondary | ICD-10-CM | POA: Diagnosis not present

## 2015-04-26 DIAGNOSIS — Z87891 Personal history of nicotine dependence: Secondary | ICD-10-CM | POA: Diagnosis not present

## 2015-04-26 DIAGNOSIS — O9A212 Injury, poisoning and certain other consequences of external causes complicating pregnancy, second trimester: Secondary | ICD-10-CM | POA: Diagnosis not present

## 2015-04-26 DIAGNOSIS — Y9389 Activity, other specified: Secondary | ICD-10-CM | POA: Insufficient documentation

## 2015-04-26 DIAGNOSIS — O99352 Diseases of the nervous system complicating pregnancy, second trimester: Secondary | ICD-10-CM | POA: Diagnosis not present

## 2015-04-26 DIAGNOSIS — S40861A Insect bite (nonvenomous) of right upper arm, initial encounter: Secondary | ICD-10-CM | POA: Insufficient documentation

## 2015-04-26 DIAGNOSIS — Z8619 Personal history of other infectious and parasitic diseases: Secondary | ICD-10-CM | POA: Insufficient documentation

## 2015-04-26 DIAGNOSIS — Z880 Allergy status to penicillin: Secondary | ICD-10-CM | POA: Insufficient documentation

## 2015-04-26 DIAGNOSIS — Z8639 Personal history of other endocrine, nutritional and metabolic disease: Secondary | ICD-10-CM | POA: Diagnosis not present

## 2015-04-26 DIAGNOSIS — M5430 Sciatica, unspecified side: Secondary | ICD-10-CM | POA: Insufficient documentation

## 2015-04-26 LAB — URINE MICROSCOPIC-ADD ON

## 2015-04-26 LAB — CBC WITH DIFFERENTIAL/PLATELET
BASOS ABS: 0 10*3/uL (ref 0.0–0.1)
Basophils Relative: 0 %
Eosinophils Absolute: 0.4 10*3/uL (ref 0.0–0.7)
Eosinophils Relative: 3 %
HEMATOCRIT: 35.5 % — AB (ref 36.0–46.0)
Hemoglobin: 12.1 g/dL (ref 12.0–15.0)
LYMPHS ABS: 1.3 10*3/uL (ref 0.7–4.0)
LYMPHS PCT: 11 %
MCH: 30.5 pg (ref 26.0–34.0)
MCHC: 34.1 g/dL (ref 30.0–36.0)
MCV: 89.4 fL (ref 78.0–100.0)
MONO ABS: 0.7 10*3/uL (ref 0.1–1.0)
Monocytes Relative: 6 %
NEUTROS ABS: 9.6 10*3/uL — AB (ref 1.7–7.7)
Neutrophils Relative %: 80 %
Platelets: 249 10*3/uL (ref 150–400)
RBC: 3.97 MIL/uL (ref 3.87–5.11)
RDW: 13.9 % (ref 11.5–15.5)
WBC: 11.9 10*3/uL — ABNORMAL HIGH (ref 4.0–10.5)

## 2015-04-26 LAB — URINALYSIS, ROUTINE W REFLEX MICROSCOPIC
Bilirubin Urine: NEGATIVE
GLUCOSE, UA: NEGATIVE mg/dL
Hgb urine dipstick: NEGATIVE
Ketones, ur: NEGATIVE mg/dL
Nitrite: NEGATIVE
PH: 6.5 (ref 5.0–8.0)
Protein, ur: NEGATIVE mg/dL
SPECIFIC GRAVITY, URINE: 1.021 (ref 1.005–1.030)

## 2015-04-26 LAB — BASIC METABOLIC PANEL
ANION GAP: 12 (ref 5–15)
BUN: <5 mg/dL — ABNORMAL LOW (ref 6–20)
CO2: 19 mmol/L — AB (ref 22–32)
Calcium: 8.8 mg/dL — ABNORMAL LOW (ref 8.9–10.3)
Chloride: 104 mmol/L (ref 101–111)
Creatinine, Ser: 0.49 mg/dL (ref 0.44–1.00)
GFR calc Af Amer: >60 mL/min (ref >60)
GFR calc non Af Amer: >60 mL/min (ref >60)
GLUCOSE: 121 mg/dL — AB (ref 65–99)
POTASSIUM: 3.7 mmol/L (ref 3.5–5.1)
Sodium: 135 mmol/L (ref 135–145)

## 2015-04-26 LAB — HCG, QUANTITATIVE, PREGNANCY: hCG, Beta Chain, Quant, S: 14423 m[IU]/mL — ABNORMAL HIGH (ref <5)

## 2015-04-26 MED ORDER — ACETAMINOPHEN 325 MG PO TABS
650.0000 mg | ORAL_TABLET | Freq: Once | ORAL | Status: AC
  Administered 2015-04-26: 650 mg via ORAL

## 2015-04-26 MED ORDER — ACETAMINOPHEN 325 MG PO TABS
ORAL_TABLET | ORAL | Status: AC
  Filled 2015-04-26: qty 2

## 2015-04-26 NOTE — Discharge Instructions (Signed)
Abdominal Pain, Adult Follow up with your OBGYN.   Many things can cause belly (abdominal) pain. Most times, the belly pain is not dangerous. Many cases of belly pain can be watched and treated at home. HOME CARE   Do not take medicines that help you go poop (laxatives) unless told to by your doctor.  Only take medicine as told by your doctor.  Eat or drink as told by your doctor. Your doctor will tell you if you should be on a special diet. GET HELP IF:  You do not know what is causing your belly pain.  You have belly pain while you are sick to your stomach (nauseous) or have runny poop (diarrhea).  You have pain while you pee or poop.  Your belly pain wakes you up at night.  You have belly pain that gets worse or better when you eat.  You have belly pain that gets worse when you eat fatty foods.  You have a fever. GET HELP RIGHT AWAY IF:   The pain does not go away within 2 hours.  You keep throwing up (vomiting).  The pain changes and is only in the right or left part of the belly.  You have bloody or tarry looking poop. MAKE SURE YOU:   Understand these instructions.  Will watch your condition.  Will get help right away if you are not doing well or get worse.   This information is not intended to replace advice given to you by your health care provider. Make sure you discuss any questions you have with your health care provider.   Document Released: 11/13/2007 Document Revised: 06/17/2014 Document Reviewed: 02/03/2013 Elsevier Interactive Patient Education [employer]2016 Elsevier Inc.

## 2015-04-26 NOTE — ED Notes (Signed)
Pt reports chest tightness

## 2015-04-26 NOTE — Progress Notes (Signed)
Pt is a G2P0 in with c/o lower abdominal cramping and pain in left side of abdomen, increased white vaginal discharge with no itching or burning; Pt also has some tightness in chest, pt says it is worse when she inhales. Fhr is in the 150's; no vaginal bleeding; positive fetal movement

## 2015-04-26 NOTE — ED Notes (Signed)
[facility] made aware of pt status. Will pass along to day shift rapid OB.

## 2015-04-26 NOTE — ED Notes (Signed)
Pt reports awaking from her sleep and reports that she was bitten by something. Pt unaware of what bit her. Swollen raised red area to her R upper arm. Pt reports lower abdominal cramping. Pt driving to work and reports cramping to lower right abdomen.   Pt is 20 weeks preg.

## 2015-04-26 NOTE — Progress Notes (Signed)
RROB told Dr Renaldo FiddlerAdkins about pt complaints, symptoms, interventions, efm and lab work. Orders that pt may be OB cleared as long as cervix is closed.

## 2015-04-26 NOTE — ED Provider Notes (Signed)
CSN: [id number]     Arrival date & time 04/26/15  [id number] History   First MD Initiated Contact with Patient 04/26/15 0654     No chief complaint on file.   (Consider location/radiation/quality/duration/timing/severity/associated sxs/prior Treatment) The history is provided by the patient. No language interpreter was used.  Cynthia Townsend is a 34 y.o female with a history of anxiety, ADD, depression, and insomnia who is [redacted] weeks pregnant who presents for sudden onset lower abdominal and left-sided cramping with diaphoresis and an insect bite to her right arm. She is also complaining that she is having an anxiety attack which she has had in the past and causes her central chest tightness. She states that she is nervous because she had a right-sided ectopic pregnancy in the past. She had her 20 week follow-up and ultrasound and everything checked out okay. She states that she remains short of breath with her pregnancy and worse with exertion. She denies any fever, chills, cough, nausea, vomiting, or diarrhea. She also denies any vaginal bleeding.   Past Medical History  Diagnosis Date  . Anxiety   . ADD (attention deficit disorder)   . Sciatic pain   . Chicken pox   . Depression   . Subclinical hypothyroidism   . Insomnia   . Vitamin D deficiency   . Nasal polyps    Past Surgical History  Procedure Laterality Date  . Breast biopsy      benign   Family History  Problem Relation Age of Onset  . Alcohol abuse Mother   . Lung cancer Mother   . Hyperlipidemia Mother   . Heart disease Mother   . Mental illness Mother   . Hyperthyroidism Mother   . Hyperlipidemia Father   . Heart disease Father   . Transient ischemic attack Father   . Heart disease Sister   . Mental illness Sister   . Heart disease Maternal Uncle   . Rheum arthritis Maternal Grandmother    Social History  Substance Use Topics  . Smoking status: Former Smoker    Types: Cigarettes  . Smokeless tobacco: Never Used  .  Alcohol Use: No   OB History    Gravida Para Term Preterm AB TAB SAB Ectopic Multiple Living   2 0 0 0 1 0 0 1 0 0      Review of Systems  Constitutional: Negative for fever and chills.  Gastrointestinal: Positive for abdominal pain. Negative for nausea, vomiting and diarrhea.  All other systems reviewed and are negative.     Allergies  Penicillins and Sulfa antibiotics  Home Medications   Prior to Admission medications   Medication Sig Start Date End Date Taking? Authorizing Provider  acetaminophen (TYLENOL) 500 MG tablet Take 1,000 mg by mouth every 6 (six) hours as needed for mild pain.   Yes Historical Provider, MD  cyclobenzaprine (FLEXERIL) 5 MG tablet Take 5 mg by mouth 3 (three) times daily as needed (tension headache).  03/31/15  Yes Historical Provider, MD  diphenhydrAMINE (BENADRYL) 25 MG tablet Take 25 mg by mouth every 6 (six) hours as needed for allergies.    Yes Historical Provider, MD  doxylamine, Sleep, (UNISOM) 25 MG tablet Take 25 mg by mouth at bedtime as needed for sleep.    Yes Historical Provider, MD  Prenatal Vit-Fe Fumarate-FA (PRENATAL MULTIVITAMIN) TABS tablet Take 1 tablet by mouth daily at 12 noon.   Yes Historical Provider, MD   BP 133/66 mmHg  Pulse 82  Temp(Src) 97.7 F (36.5  C) (Oral)  Resp 21  Ht  (1.6 m)  Wt 183 lb (83.008 kg)  BMI 32.43 kg/m2  SpO2 100% Physical Exam  Constitutional: She is oriented to person, place, and time. She appears well-developed and well-nourished.  HENT:  Head: Normocephalic and atraumatic.  Eyes: Conjunctivae are normal.  Neck: Normal range of motion. Neck supple.  Cardiovascular: Normal rate, regular rhythm and normal heart sounds.   Regular rate and rhythm. No murmur.  Pulmonary/Chest: Effort normal and breath sounds normal.  Lungs clear to auscultation bilaterally.  Abdominal: Soft. Normal appearance. There is tenderness in the suprapubic area and left upper quadrant. There is no rebound, no guarding  and no CVA tenderness.    Gravid uterus. Abdomen is soft. No guarding or rebound.  Musculoskeletal: Normal range of motion.  Neurological: She is alert and oriented to person, place, and time.  Skin: Skin is warm and dry.  1 cm macular, raised, erythematous and indurated area on the right upper extremity along the tricep with tenderness but no surrounding erythema, fluctuance or drainage.  Nursing note and vitals reviewed.   ED Course  Procedures (including critical care time) Labs Review Labs Reviewed  CBC WITH DIFFERENTIAL/PLATELET - Abnormal; Notable for the following:    WBC 11.9 (*)    HCT 35.5 (*)    Neutro Abs 9.6 (*)    All other components within normal limits  BASIC METABOLIC PANEL - Abnormal; Notable for the following:    CO2 19 (*)    Glucose, Bld 121 (*)    BUN <5 (*)    Calcium 8.8 (*)    All other components within normal limits  URINALYSIS, ROUTINE W REFLEX MICROSCOPIC (NOT AT [facility]) - Abnormal; Notable for the following:    APPearance CLOUDY (*)    Leukocytes, UA TRACE (*)    All other components within normal limits  HCG, QUANTITATIVE, PREGNANCY - Abnormal; Notable for the following:    hCG, Beta Chain, Quant, S 14423 (*)    All other components within normal limits  URINE MICROSCOPIC-ADD ON - Abnormal; Notable for the following:    Squamous Epithelial / LPF 0-5 (*)    Bacteria, UA RARE (*)    All other components within normal limits  URINE CULTURE    Imaging Review No results found. I have personally reviewed and evaluated these lab results as part of my medical decision-making.   EKG Interpretation   Date/Time:  Wednesday April 26 2015 06:56:22 EST Ventricular Rate:  79 PR Interval:  116 QRS Duration: 88 QT Interval:  386 QTC Calculation: 442 R Axis:   53 Text Interpretation:  Sinus rhythm Borderline short PR interval PR  interval decreased since last tracing Confirmed by KNAPP  MD-J, JON  ([id number]) on 04/26/2015 7:03:01 AM      MDM    Final diagnoses:  Abdominal cramping affecting pregnancy  Insect bite  Anxiety  [redacted] week pregnant patient presents for lower and left-sided abdominal cramping with diaphoresis that began suddenly while driving to work this morning. She is also complaining of having an anxiety attack and an insect bite to the right upper extremity. Her vital signs are stable and she is well-appearing.  OB nurse reports that the patient is having increased vaginal discharge but no vaginal itching or burning.  She is not tachycardic, respiratory rate is normal, and she is 100% oxygen on room air.  I think her shortness of breath and chest tightness is related to her anxiety that she  is feeling. Her EKG is normal.  She is much more calm after OB nurse has seen and evaluated her. There is good fetal heart tones. The insect bite on her right upper extremity does not appear infected and the patient was given return precautions. Her labs are not concerning and she does not have a UTI. A culture was sent since she reported lower and left sided abdominal cramping.  Medications  acetaminophen (TYLENOL) tablet 650 mg (650 mg Oral Given 04/26/15 0737)       Catha Gosselin, PA-C 04/26/15 1507  Linwood Dibbles, MD 04/28/15 [phone]

## 2015-04-27 ENCOUNTER — Emergency Department (HOSPITAL_COMMUNITY)
Admission: EM | Admit: 2015-04-27 | Discharge: 2015-04-27 | Disposition: A | Discharge status: Home / Self Care | Payer: 59 | Attending: Emergency Medicine | Admitting: Emergency Medicine

## 2015-04-27 ENCOUNTER — Encounter (HOSPITAL_COMMUNITY): Payer: Self-pay | Admitting: Neurology

## 2015-04-27 DIAGNOSIS — Z87891 Personal history of nicotine dependence: Secondary | ICD-10-CM | POA: Insufficient documentation

## 2015-04-27 DIAGNOSIS — G4700 Insomnia, unspecified: Secondary | ICD-10-CM | POA: Diagnosis not present

## 2015-04-27 DIAGNOSIS — L03113 Cellulitis of right upper limb: Secondary | ICD-10-CM | POA: Insufficient documentation

## 2015-04-27 DIAGNOSIS — Z880 Allergy status to penicillin: Secondary | ICD-10-CM | POA: Diagnosis not present

## 2015-04-27 DIAGNOSIS — O99352 Diseases of the nervous system complicating pregnancy, second trimester: Secondary | ICD-10-CM | POA: Diagnosis not present

## 2015-04-27 DIAGNOSIS — Z8619 Personal history of other infectious and parasitic diseases: Secondary | ICD-10-CM | POA: Insufficient documentation

## 2015-04-27 DIAGNOSIS — Z8709 Personal history of other diseases of the respiratory system: Secondary | ICD-10-CM | POA: Insufficient documentation

## 2015-04-27 DIAGNOSIS — O99712 Diseases of the skin and subcutaneous tissue complicating pregnancy, second trimester: Secondary | ICD-10-CM | POA: Diagnosis not present

## 2015-04-27 DIAGNOSIS — Z8659 Personal history of other mental and behavioral disorders: Secondary | ICD-10-CM | POA: Insufficient documentation

## 2015-04-27 DIAGNOSIS — Z87828 Personal history of other (healed) physical injury and trauma: Secondary | ICD-10-CM | POA: Diagnosis not present

## 2015-04-27 DIAGNOSIS — Z3A20 20 weeks gestation of pregnancy: Secondary | ICD-10-CM | POA: Insufficient documentation

## 2015-04-27 DIAGNOSIS — Z8739 Personal history of other diseases of the musculoskeletal system and connective tissue: Secondary | ICD-10-CM | POA: Insufficient documentation

## 2015-04-27 DIAGNOSIS — Z8639 Personal history of other endocrine, nutritional and metabolic disease: Secondary | ICD-10-CM | POA: Diagnosis not present

## 2015-04-27 LAB — URINE CULTURE
Culture: 3000
Special Requests: NORMAL

## 2015-04-27 MED ORDER — CLINDAMYCIN HCL 300 MG PO CAPS
300.0000 mg | ORAL_CAPSULE | Freq: Once | ORAL | Status: AC
  Administered 2015-04-27: 300 mg via ORAL
  Filled 2015-04-27: qty 1

## 2015-04-27 MED ORDER — CLINDAMYCIN HCL 300 MG PO CAPS: 300.0000 mg | ORAL_CAPSULE | Freq: Three times a day (TID) | ORAL | Status: DC

## 2015-04-27 NOTE — Discharge Instructions (Signed)

## 2015-04-27 NOTE — ED Notes (Signed)
Pt is here with insect bite to right upper arm since yesterday. Was seen here yesterday for same and she is [redacted] weeks pregnant. Today she is back because  The site has gotten larger. Is warm to touch. Denies abd pain, or fluid discharge. Pt is a x 4. Yesterday was seen by rapid ob and baby was good. Called OB today and since she reported site was getting bigger, told to return here.

## 2015-04-27 NOTE — ED Notes (Signed)
Requested meds from pharmacy. Pt will be d/c after medicine given.

## 2015-05-01 ENCOUNTER — Ambulatory Visit (INDEPENDENT_AMBULATORY_CARE_PROVIDER_SITE_OTHER): Payer: 59 | Admitting: Physician Assistant

## 2015-05-01 VITALS — BP 128/68 | HR 91 | Temp 98.7°F | Resp 16 | Ht 64.0 in | Wt 188.8 lb

## 2015-05-01 DIAGNOSIS — L02413 Cutaneous abscess of right upper limb: Secondary | ICD-10-CM

## 2015-05-01 DIAGNOSIS — R202 Paresthesia of skin: Secondary | ICD-10-CM | POA: Diagnosis not present

## 2015-05-01 DIAGNOSIS — R197 Diarrhea, unspecified: Secondary | ICD-10-CM

## 2015-05-01 NOTE — Progress Notes (Signed)
[facility] [address], [place] [id number] [phone]  Date:  05/01/2015   Name:  Shana ChuteKeasha Lamarque   DOB:  04/22/1981   MRN:  [id number]  PCP:  Jeanine Luzalone, Gregory, FNP    Chief Complaint: Insect Bite   History of Present Illness:  This is a 34 y.o. [redacted] week pregnant female with PMH GAD, vit D def, ADD who is presenting with a "spider bite" to her right shoulder 6 days ago. At onset she had right arm soreness that gradually developed erythema and worsening pain. She started feeling anxious about her pain and develop cp, sob, abdominal pain. She went to the ED. OB work up negative. She was given cleocin for right upper extremity cellulitis and discharged. She states the lesion on her right upper arm has improved greatly. Pain has improved and erythema reduced. She has noted that there is a pustule that is getting worse though. She is noting some new diarrhea in the past week. Also her right 2-4 digits feel mildly numb. She has had problems with carpal tunnel throughout her pregnancy but currently doing ok with it. She has two more days of cleocin left. She denies fever, chills. She no longer has abdominal pain, cp, sob. She is endorsing fatigue.  Review of Systems:  Review of Systems See HPI  Patient Active Problem List   Diagnosis Date Noted  . Tenosynovitis of thumb 04/06/2015  . Irregular menstrual cycle 12/29/2014  . Nasal polyps 12/29/2014  . ADD (attention deficit disorder) 12/29/2014  . Vitamin D deficiency 12/29/2014  . Generalized anxiety disorder 12/29/2014    Prior to Admission medications   Medication Sig Start Date End Date Taking? Authorizing Provider  acetaminophen (TYLENOL) 500 MG tablet Take 1,000 mg by mouth every 6 (six) hours as needed for mild pain.   prn Historical Provider, MD  clindamycin (CLEOCIN) 300 MG capsule Take 1 capsule (300 mg total) by mouth 3 (three) times daily. 04/27/15  yes Raeford RazorStephen Kohut, MD  cyclobenzaprine (FLEXERIL) 5 MG  tablet Take 5 mg by mouth 3 (three) times daily as needed (tension headache).  03/31/15  prn Historical Provider, MD  diphenhydrAMINE (BENADRYL) 25 MG tablet Take 25 mg by mouth every 6 (six) hours as needed for allergies.    prn Historical Provider, MD  doxylamine, Sleep, (UNISOM) 25 MG tablet Take 25 mg by mouth at bedtime as needed for sleep.    prn Historical Provider, MD  Prenatal Vit-Fe Fumarate-FA (PRENATAL MULTIVITAMIN) TABS tablet Take 1 tablet by mouth daily at 12 noon.   yes Historical Provider, MD    Allergies  Allergen Reactions  . Penicillins Shortness Of Breath and Swelling  . Sulfa Antibiotics Hives    Past Surgical History  Procedure Laterality Date  . Breast biopsy      benign    Social History  Substance Use Topics  . Smoking status: Former Smoker    Types: Cigarettes  . Smokeless tobacco: Never Used  . Alcohol Use: No    Family History  Problem Relation Age of Onset  . Alcohol abuse Mother   . Lung cancer Mother   . Hyperlipidemia Mother   . Heart disease Mother   . Mental illness Mother   . Hyperthyroidism Mother   . Hyperlipidemia Father   . Heart disease Father   . Transient ischemic attack Father   . Heart disease Sister   . Mental illness Sister   . Heart disease Maternal Uncle   .  Rheum arthritis Maternal Grandmother     Medication list has been reviewed and updated.  Physical Examination:  Physical Exam  Constitutional: She is oriented to person, place, and time. She appears well-developed and well-nourished. No distress.  HENT:  Head: Normocephalic and atraumatic.  Right Ear: Hearing normal.  Left Ear: Hearing normal.  Nose: Nose normal.  Eyes: Conjunctivae and lids are normal. Right eye exhibits no discharge. Left eye exhibits no discharge. No scleral icterus.  Cardiovascular: Normal rate, regular rhythm, normal heart sounds and normal pulses.   No murmur heard. Pulmonary/Chest: Effort normal and breath sounds normal. No  respiratory distress.  Abdominal: There is no tenderness.  Musculoskeletal: Normal range of motion.       Right shoulder: Normal.       Left shoulder: Normal.       Right elbow: Normal.      Left elbow: Normal.       Right wrist: Normal.       Left wrist: Normal.  Sensation intact in fingers. Right 2-4 digits with mildly increased subj sensitivity Durkans test produces increased tingling in right 2-4 digits  Neurological: She is alert and oriented to person, place, and time.  Skin: Skin is warm, dry and intact.  Right upper outer arm with 0.5 cm pustule and mild surrounding induration and erythema. Small amount fluctuance just over pustule. Mildly ttp.  Psychiatric: She has a normal mood and affect. Her speech is normal and behavior is normal. Thought content normal.   BP 128/68 mmHg  Pulse 91  Temp(Src) 98.7 F (37.1 C) (Oral)  Resp 16  Ht  (1.626 m)  Wt 188 lb 12.8 oz (85.639 kg)  BMI 32.39 kg/m2  SpO2 99%  Procedure: Verbal consent obtained. Skin was cleaned with alcohol and anesthetized with 2 cc 1% lido with epi. A 0.5 cm incision was made. A small-mod amount of purulent material expressed. Wound cavity size 0.5 cm without any tracking. Wound packed with 1/4 inch packing. Wound dressed and wound care discussed.  Assessment and Plan:  1. Abscess of arm, right I&D produced small-mod purulence. Wound packed. Wound so small I don't anticipate her needing f/u. She will pull packing out slightly tmrw and then fully in 48 hours. Discussed wound care. Continue cleocin. Return as needed. - Wound culture  2. Tingling in extremities Tingling could be related to carpal tunnel d/t pregnancy or could be from mild swelling in upper arm from lesion. She will monitor over next week and let OB know if not resolving or if getting worse.  3. Diarrhea, unspecified type Likely d/t cleocin. Diarrhea is mild. We discussed yogurt intake to protect her gut. Let OB know if not resolving after  finishing abx or return if gets worse at any time.   [place] Min[place] Dyke Brackett, MHS [facility]  05/01/2015

## 2015-05-01 NOTE — Patient Instructions (Signed)
Change dressing if dressing is soiled. Keep covered at all times except when showering. You may get wet in the shower but do not scrub.  Apply warm compresses/heating pad to facilitate drainage. Do not apply any ointments as this may delay healing. Finish clindamycin. Eat yogurt daily to prevent GI upset. Pull out packing slightly tomorrow. Pull out completely in 48 hours. Return as needed.

## 2015-05-03 LAB — WOUND CULTURE
GRAM STAIN: NONE SEEN
Gram Stain: NONE SEEN
ORGANISM ID, BACTERIA: NO GROWTH

## 2015-05-03 NOTE — Progress Notes (Signed)
  Medical screening examination/treatment/procedure(s) were performed by non-physician practitioner and as supervising physician I was immediately available for consultation/collaboration.     

## 2015-05-11 NOTE — ED Provider Notes (Signed)
CSN: [id number]     Arrival date & time 04/27/15  1119 History   First MD Initiated Contact with Patient 04/27/15 1318     Chief Complaint  Patient presents with  . Insect Bite     (Consider location/radiation/quality/duration/timing/severity/associated sxs/prior Treatment) HPI   34yf with increasing pain/redness RUE. Seen in ED yesterday for insect bite. Reports increasing symptoms symptoms since then. No fever or chills. No n/v. No other lesions. No drainage. Is pregnant. No swelling.no hx of dvt/pe.  Past Medical History  Diagnosis Date  . Anxiety   . ADD (attention deficit disorder)   . Sciatic pain   . Chicken pox   . Depression   . Subclinical hypothyroidism   . Insomnia   . Vitamin D deficiency   . Nasal polyps    Past Surgical History  Procedure Laterality Date  . Breast biopsy      benign   Family History  Problem Relation Age of Onset  . Alcohol abuse Mother   . Lung cancer Mother   . Hyperlipidemia Mother   . Heart disease Mother   . Mental illness Mother   . Hyperthyroidism Mother   . Hyperlipidemia Father   . Heart disease Father   . Transient ischemic attack Father   . Heart disease Sister   . Mental illness Sister   . Heart disease Maternal Uncle   . Rheum arthritis Maternal Grandmother    Social History  Substance Use Topics  . Smoking status: Former Smoker    Types: Cigarettes  . Smokeless tobacco: Never Used  . Alcohol Use: No   OB History    Gravida Para Term Preterm AB TAB SAB Ectopic Multiple Living   2 0 0 0 1 0 0 1 0 0      Review of Systems  All systems reviewed and negative, other than as noted in HPI.   Allergies  Penicillins and Sulfa antibiotics  Home Medications   Prior to Admission medications   Medication Sig Start Date End Date Taking? Authorizing Provider  acetaminophen (TYLENOL) 500 MG tablet Take 1,000 mg by mouth every 6 (six) hours as needed for mild pain.    Historical Provider, MD  clindamycin (CLEOCIN) 300  MG capsule Take 1 capsule (300 mg total) by mouth 3 (three) times daily. 04/27/15   Raeford RazorStephen Dustee Bottenfield, MD  cyclobenzaprine (FLEXERIL) 5 MG tablet Take 5 mg by mouth 3 (three) times daily as needed (tension headache).  03/31/15   Historical Provider, MD  diphenhydrAMINE (BENADRYL) 25 MG tablet Take 25 mg by mouth every 6 (six) hours as needed for allergies.     Historical Provider, MD  doxylamine, Sleep, (UNISOM) 25 MG tablet Take 25 mg by mouth at bedtime as needed for sleep.     Historical Provider, MD  Prenatal Vit-Fe Fumarate-FA (PRENATAL MULTIVITAMIN) TABS tablet Take 1 tablet by mouth daily at 12 noon.    Historical Provider, MD   BP 133/75 mmHg  Pulse 95  Temp(Src) 99 F (37.2 C) (Oral)  Resp 16  SpO2 100% Physical Exam  Constitutional: She appears well-developed and well-nourished. No distress.  HENT:  Head: Normocephalic and atraumatic.  Eyes: Conjunctivae are normal. Right eye exhibits no discharge. Left eye exhibits no discharge.  Neck: Neck supple.  Cardiovascular: Normal rate, regular rhythm and normal heart sounds.  Exam reveals no gallop and no friction rub.   No murmur heard. Pulmonary/Chest: Effort normal and breath sounds normal. No respiratory distress.  Abdominal: Soft. She exhibits no distension.  There is no tenderness.  Musculoskeletal: She exhibits no edema or tenderness.  Interrelated lesion about the size of a pencil eraser over the R lateral deltoid. Erythema, increased warmth and tenderness extending around this for several centimeters consistent with cellulitis. No fluctuance. No drainage. Patient can actively range the shoulder with no apparent difficulty.  Neurological: She is alert.  Skin: Skin is warm and dry.  Psychiatric: She has a normal mood and affect. Her behavior is normal. Thought content normal.  Nursing note and vitals reviewed.   ED Course  Procedures (including critical care time) Labs Review Labs Reviewed - No data to display  Imaging  Review No results found. I have personally reviewed and evaluated these images and lab results as part of my medical decision-making.   EKG Interpretation None      MDM   Final diagnoses:  Cellulitis of right upper arm    34 year old female who I think is starting to develop a cellulitis outside her recent presumed insect bite. No drainable collection. She is afebrile. She is pregnant, but at this point I feel she is appropriate for outpatient treatment. She was started on antibiotics. Return precautions were discussed.    Raeford Razor, MD 05/11/15 1754

## 2015-05-13 ENCOUNTER — Telehealth: Payer: Self-pay

## 2015-05-13 NOTE — Telephone Encounter (Signed)
LVM for pt to call back as soon as possible.   RE: Flu Vaccine for 2016 season.  

## 2015-05-16 NOTE — Telephone Encounter (Signed)
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

## 2015-05-16 NOTE — Telephone Encounter (Signed)
Flu vacc info documented.

## 2015-06-09 ENCOUNTER — Encounter (HOSPITAL_COMMUNITY): Payer: Self-pay | Admitting: *Deleted

## 2015-06-09 ENCOUNTER — Inpatient Hospital Stay (HOSPITAL_COMMUNITY)
Admission: AD | Admit: 2015-06-09 | Discharge: 2015-06-09 | Disposition: A | Discharge status: Home / Self Care | Payer: 59 | Source: Intra-hospital | Attending: Obstetrics & Gynecology | Admitting: Obstetrics & Gynecology

## 2015-06-09 DIAGNOSIS — E039 Hypothyroidism, unspecified: Secondary | ICD-10-CM | POA: Insufficient documentation

## 2015-06-09 DIAGNOSIS — O368120 Decreased fetal movements, second trimester, not applicable or unspecified: Secondary | ICD-10-CM | POA: Diagnosis not present

## 2015-06-09 DIAGNOSIS — Z3A26 26 weeks gestation of pregnancy: Secondary | ICD-10-CM

## 2015-06-09 DIAGNOSIS — Z3689 Encounter for other specified antenatal screening: Secondary | ICD-10-CM

## 2015-06-09 DIAGNOSIS — R05 Cough: Secondary | ICD-10-CM | POA: Insufficient documentation

## 2015-06-09 DIAGNOSIS — Z87891 Personal history of nicotine dependence: Secondary | ICD-10-CM | POA: Diagnosis not present

## 2015-06-09 DIAGNOSIS — J069 Acute upper respiratory infection, unspecified: Secondary | ICD-10-CM

## 2015-06-09 DIAGNOSIS — F419 Anxiety disorder, unspecified: Secondary | ICD-10-CM | POA: Insufficient documentation

## 2015-06-09 DIAGNOSIS — O368121 Decreased fetal movements, second trimester, fetus 1: Secondary | ICD-10-CM

## 2015-06-09 DIAGNOSIS — F329 Major depressive disorder, single episode, unspecified: Secondary | ICD-10-CM | POA: Diagnosis not present

## 2015-06-09 DIAGNOSIS — Z880 Allergy status to penicillin: Secondary | ICD-10-CM | POA: Insufficient documentation

## 2015-06-09 DIAGNOSIS — E559 Vitamin D deficiency, unspecified: Secondary | ICD-10-CM | POA: Insufficient documentation

## 2015-06-09 DIAGNOSIS — O368130 Decreased fetal movements, third trimester, not applicable or unspecified: Secondary | ICD-10-CM

## 2015-06-09 DIAGNOSIS — O009 Unspecified ectopic pregnancy without intrauterine pregnancy: Secondary | ICD-10-CM | POA: Diagnosis not present

## 2015-06-09 DIAGNOSIS — B9789 Other viral agents as the cause of diseases classified elsewhere: Secondary | ICD-10-CM

## 2015-06-09 DIAGNOSIS — Z882 Allergy status to sulfonamides status: Secondary | ICD-10-CM | POA: Insufficient documentation

## 2015-06-09 HISTORY — DX: Unspecified ectopic pregnancy without intrauterine pregnancy: O00.90

## 2015-06-09 NOTE — Progress Notes (Signed)
Written and verbal d/c instructions given and understanding voiced. 

## 2015-06-09 NOTE — Progress Notes (Signed)
efm d/ced for d/c home

## 2015-06-09 NOTE — MAU Note (Signed)
Baby's father had high temp yesterday and seen at [facility] and negative flu swab. Pt coughing some and has congestion since  this am but has had decreased FM today. Denies bleeding and leaks fluid when coughs and does not think is urine.

## 2015-06-09 NOTE — Discharge Instructions (Signed)

## 2015-06-09 NOTE — MAU Provider Note (Signed)
History     CSN: [id number]  Arrival date and time: 06/09/15 4098   First Provider Initiated Contact with Patient 06/09/15 2012      Chief Complaint  Patient presents with  . Decreased Fetal Movement  . Cough   HPI Comments: Cynthia Townsend is a 34 y.o. G2P0010 at [redacted]w[redacted]d who presents today with cough with decreased fetal movement. She states that she was in the ED with her boyfriend yesterday because he has a cough and a fever. He had a negative flu swab yesterday. Today she is coughing, and noticed that the fetus was moving less. She is also coughing. She denies any fever. She denies any vaginal bleeding or leaking of fluid. She does notice urine leaking when she is coughing. She denies any pain that feels like contractions.   Cough This is a new problem. The current episode started yesterday. The problem has been unchanged. The problem occurs every few minutes. The cough is non-productive. Associated symptoms include headaches. Pertinent negatives include no chest pain, chills, fever or shortness of breath. Nothing aggravates the symptoms. Risk factors: exposure cold. boyfriend has cold, with negative flu swab.  She has tried OTC cough suppressant for the symptoms. The treatment provided no relief.   Past Medical History  Diagnosis Date  . Anxiety   . ADD (attention deficit disorder)   . Sciatic pain   . Chicken pox   . Depression   . Subclinical hypothyroidism   . Insomnia   . Vitamin D deficiency   . Nasal polyps   . Ectopic pregnancy     Past Surgical History  Procedure Laterality Date  . Breast biopsy      benign    Family History  Problem Relation Age of Onset  . Alcohol abuse Mother   . Lung cancer Mother   . Hyperlipidemia Mother   . Heart disease Mother   . Mental illness Mother   . Hyperthyroidism Mother   . Hyperlipidemia Father   . Heart disease Father   . Transient ischemic attack Father   . Heart disease Sister   . Mental illness Sister   . Heart  disease Maternal Uncle   . Rheum arthritis Maternal Grandmother     Social History  Substance Use Topics  . Smoking status: Former Smoker    Types: Cigarettes  . Smokeless tobacco: Never Used  . Alcohol Use: No    Allergies:  Allergies  Allergen Reactions  . Penicillins Shortness Of Breath, Swelling and Other (See Comments)    "Unknown really bad childhood reaction"  . Sulfa Antibiotics Hives and Rash    Prescriptions prior to admission  Medication Sig Dispense Refill Last Dose  . acetaminophen (TYLENOL) 500 MG tablet Take 1,000 mg by mouth every 6 (six) hours as needed for mild pain.   06/09/2015 at Unknown time  . cyclobenzaprine (FLEXERIL) 5 MG tablet Take 5 mg by mouth 3 (three) times daily as needed (tension headache).   1 Past Month at Unknown time  . dextromethorphan (DELSYM) 30 MG/5ML liquid Take 60 mg by mouth as needed for cough.   06/09/2015 at Unknown time  . diphenhydrAMINE (BENADRYL) 25 MG tablet Take 25 mg by mouth every 6 (six) hours as needed for allergies or sleep.    06/08/2015 at Unknown time  . doxylamine, Sleep, (UNISOM) 25 MG tablet Take 25 mg by mouth at bedtime as needed for sleep.    06/08/2015 at Unknown time  . loratadine (CLARITIN) 10 MG tablet Take  10 mg by mouth daily as needed for allergies.   Past Week at Unknown time  . Prenatal Vit-Fe Fumarate-FA (PRENATAL MULTIVITAMIN) TABS tablet Take 1 tablet by mouth daily at 12 noon.   06/08/2015 at Unknown time  . clindamycin (CLEOCIN) 300 MG capsule Take 1 capsule (300 mg total) by mouth 3 (three) times daily. (Patient not taking: Reported on 06/09/2015) 21 capsule 0     Review of Systems  Constitutional: Negative for fever and chills.  Respiratory: Positive for cough. Negative for sputum production and shortness of breath.   Cardiovascular: Negative for chest pain.  Gastrointestinal: Negative for nausea, vomiting, abdominal pain, diarrhea and constipation.  Neurological: Positive for headaches.    Physical Exam   Blood pressure 146/77, pulse 103, temperature 98.2 F (36.8 C), resp. rate 28, height 5\' 3"  (1.6 m), weight 92.262 kg (203 lb 6.4 oz), SpO2 99 %.  Physical Exam  Nursing note and vitals reviewed. Constitutional: She is oriented to person, place, and time. She appears well-developed. No distress.  HENT:  Head: Normocephalic.  Eyes: Pupils are equal, round, and reactive to light.  Cardiovascular: Normal rate.   Respiratory: Effort normal.  GI: Soft. There is no tenderness. There is no rebound.  Neurological: She is alert and oriented to person, place, and time.  Skin: Skin is warm and dry.  Psychiatric: She has a normal mood and affect.   FHT: 145, moderate with 10x10 accels, no decels Toco: no UCs  Patient reports normal fetal movement now.  MAU Course  Procedures  MDM 2031: D/W Dr. Langston MaskerMorris, ok for DC home.   Assessment and Plan   1. Decreased fetal movement, third trimester, not applicable or unspecified fetus   2. NST (non-stress test) reactive   3. Viral URI with cough    DC home Comfort measures reviewed  3rd Trimester precautions  PTL precautions  Fetal kick counts RX: none; OTC treatments that are safe in pregnancy reviewed.  Return to MAU as needed FU with OB as planned  Follow-up Information    Follow up with MORRIS, MEGAN, DO.   Specialty:  Obstetrics and Gynecology   Why:  As scheduled   Contact information:   [address], [address], [address] [place] [address] [phone]        Tawnya CrookHogan, Heather Donovan 06/09/2015, 8:13 PM

## 2015-06-14 MED FILL — CYCLOBENZAPRINE 5 MG TABLET: 5 | 5 days supply | Qty: 30 | Fill #1

## 2015-06-15 DIAGNOSIS — Z3400 Encounter for supervision of normal first pregnancy, unspecified trimester: Secondary | ICD-10-CM | POA: Diagnosis not present

## 2015-06-15 DIAGNOSIS — Z23 Encounter for immunization: Secondary | ICD-10-CM | POA: Diagnosis not present

## 2015-06-15 DIAGNOSIS — Z36 Encounter for antenatal screening of mother: Secondary | ICD-10-CM | POA: Diagnosis not present

## 2015-06-20 DIAGNOSIS — M654 Radial styloid tenosynovitis [de Quervain]: Secondary | ICD-10-CM | POA: Diagnosis not present

## 2015-06-20 DIAGNOSIS — O26892 Other specified pregnancy related conditions, second trimester: Secondary | ICD-10-CM | POA: Diagnosis not present

## 2015-07-27 DIAGNOSIS — B373 Candidiasis of vulva and vagina: Secondary | ICD-10-CM | POA: Diagnosis not present

## 2015-07-27 DIAGNOSIS — Z3403 Encounter for supervision of normal first pregnancy, third trimester: Secondary | ICD-10-CM | POA: Diagnosis not present

## 2015-07-27 MED FILL — TERCONAZOLE 0.8% VAGINAL CR: 0.8 | 3 days supply | Qty: 20 | Fill #0

## 2015-08-10 ENCOUNTER — Inpatient Hospital Stay (HOSPITAL_COMMUNITY)
Admission: AD | Admit: 2015-08-10 | Discharge: 2015-08-10 | Disposition: A | Discharge status: Home / Self Care | Payer: 59 | Source: Ambulatory Visit | Attending: Obstetrics and Gynecology | Admitting: Obstetrics and Gynecology

## 2015-08-10 ENCOUNTER — Encounter (HOSPITAL_COMMUNITY): Payer: Self-pay | Admitting: *Deleted

## 2015-08-10 DIAGNOSIS — R030 Elevated blood-pressure reading, without diagnosis of hypertension: Secondary | ICD-10-CM | POA: Diagnosis present

## 2015-08-10 DIAGNOSIS — Z880 Allergy status to penicillin: Secondary | ICD-10-CM | POA: Diagnosis not present

## 2015-08-10 DIAGNOSIS — Z3400 Encounter for supervision of normal first pregnancy, unspecified trimester: Secondary | ICD-10-CM | POA: Diagnosis not present

## 2015-08-10 DIAGNOSIS — Z3A35 35 weeks gestation of pregnancy: Secondary | ICD-10-CM

## 2015-08-10 DIAGNOSIS — Z87891 Personal history of nicotine dependence: Secondary | ICD-10-CM | POA: Diagnosis not present

## 2015-08-10 DIAGNOSIS — O133 Gestational [pregnancy-induced] hypertension without significant proteinuria, third trimester: Secondary | ICD-10-CM | POA: Diagnosis not present

## 2015-08-10 DIAGNOSIS — O3663X0 Maternal care for excessive fetal growth, third trimester, not applicable or unspecified: Secondary | ICD-10-CM | POA: Diagnosis not present

## 2015-08-10 LAB — COMPREHENSIVE METABOLIC PANEL
ALT: 16 U/L (ref 14–54)
AST: 17 U/L (ref 15–41)
Albumin: 2.7 g/dL — ABNORMAL LOW (ref 3.5–5.0)
Alkaline Phosphatase: 141 U/L — ABNORMAL HIGH (ref 38–126)
Anion gap: 7 (ref 5–15)
BUN: 10 mg/dL (ref 6–20)
CO2: 24 mmol/L (ref 22–32)
Calcium: 8.8 mg/dL — ABNORMAL LOW (ref 8.9–10.3)
Chloride: 104 mmol/L (ref 101–111)
Creatinine, Ser: 0.38 mg/dL — ABNORMAL LOW (ref 0.44–1.00)
GFR calc Af Amer: >60 mL/min (ref >60)
GFR calc non Af Amer: >60 mL/min (ref >60)
Glucose, Bld: 110 mg/dL — ABNORMAL HIGH (ref 65–99)
Potassium: 4.1 mmol/L (ref 3.5–5.1)
Sodium: 135 mmol/L (ref 135–145)
Total Bilirubin: 0.4 mg/dL (ref 0.3–1.2)
Total Protein: 6.6 g/dL (ref 6.5–8.1)

## 2015-08-10 LAB — URINALYSIS, ROUTINE W REFLEX MICROSCOPIC
Bilirubin Urine: NEGATIVE
Glucose, UA: NEGATIVE mg/dL
Ketones, ur: 15 mg/dL — AB
Leukocytes, UA: NEGATIVE
Nitrite: NEGATIVE
Protein, ur: NEGATIVE mg/dL
Specific Gravity, Urine: >1.03 — ABNORMAL HIGH (ref 1.005–1.030)
pH: 6 (ref 5.0–8.0)

## 2015-08-10 LAB — PROTEIN / CREATININE RATIO, URINE
Creatinine, Urine: 147 mg/dL
Protein Creatinine Ratio: 0.16 mg/mg{Cre} — ABNORMAL HIGH (ref 0.00–0.15)
Total Protein, Urine: 23 mg/dL

## 2015-08-10 LAB — CBC
HCT: 32.1 % — ABNORMAL LOW (ref 36.0–46.0)
Hemoglobin: 10.7 g/dL — ABNORMAL LOW (ref 12.0–15.0)
MCH: 29.1 pg (ref 26.0–34.0)
MCHC: 33.3 g/dL (ref 30.0–36.0)
MCV: 87.2 fL (ref 78.0–100.0)
Platelets: 245 10*3/uL (ref 150–400)
RBC: 3.68 MIL/uL — ABNORMAL LOW (ref 3.87–5.11)
RDW: 14.5 % (ref 11.5–15.5)
WBC: 9.4 10*3/uL (ref 4.0–10.5)

## 2015-08-10 LAB — URINE MICROSCOPIC-ADD ON

## 2015-08-10 LAB — LACTATE DEHYDROGENASE: LDH: 126 U/L (ref 98–192)

## 2015-08-10 LAB — URIC ACID: Uric Acid, Serum: 4.6 mg/dL (ref 2.3–6.6)

## 2015-08-10 NOTE — MAU Note (Signed)
Pt presents to MAU for PIH evaluation. Pt was sent over from office after being evaluated with an increase in blood pressure. Reports mild headache.

## 2015-08-10 NOTE — Discharge Instructions (Signed)
Hypertension During Pregnancy °Hypertension is also called high blood pressure. Blood pressure moves blood in your body. Sometimes, the force that moves the blood becomes too strong. When you are pregnant, this condition should be watched carefully. It can cause problems for you and your baby. °HOME CARE  °· Make and keep all of your doctor visits. °· Take medicine as told by your doctor. Tell your doctor about all medicines you take. °· Eat very little salt. °· Exercise regularly. °· Do not drink alcohol. °· Do not smoke. °· Do not have drinks with caffeine. °· Lie on your left side when resting. °· Your health care provider may ask you to take one low-dose aspirin (81mg) each day. °GET HELP RIGHT AWAY IF: °· You have bad belly (abdominal) pain. °· You have sudden puffiness (swelling) in the hands, ankles, or face. °· You gain 4 pounds (1.8 kilograms) or more in 1 week. °· You throw up (vomit) repeatedly. °· You have bleeding from the vagina. °· You do not feel the baby moving as much. °· You have a headache. °· You have blurred or double vision. °· You have muscle twitching or spasms. °· You have shortness of breath. °· You have blue fingernails and lips. °· You have blood in your pee (urine). °MAKE SURE YOU: °· Understand these instructions. °· Will watch your condition. °· Will get help right away if you are not doing well or get worse. °  °This information is not intended to replace advice given to you by your health care provider. Make sure you discuss any questions you have with your health care provider. °  °Document Released: 06/29/2010 Document Revised: 06/17/2014 Document Reviewed: 12/24/2012 °Elsevier Interactive Patient Education ©2016 [employer] ° °

## 2015-08-10 NOTE — MAU Provider Note (Signed)
History     CSN: [id number]  Arrival date and time: 08/10/15 1346   First Provider Initiated Contact with Patient 08/10/15 1418      No chief complaint on file.  HPI  Cynthia Townsend 35 y.o. G2P0010 @ [redacted]w[redacted]d presents from the office for serial b/p's and preeclampsia labs.  Past Medical History  Diagnosis Date  . Anxiety   . ADD (attention deficit disorder)   . Sciatic pain   . Chicken pox   . Depression   . Subclinical hypothyroidism   . Insomnia   . Vitamin D deficiency   . Nasal polyps   . Ectopic pregnancy     Past Surgical History  Procedure Laterality Date  . Breast biopsy      benign    Family History  Problem Relation Age of Onset  . Alcohol abuse Mother   . Lung cancer Mother   . Hyperlipidemia Mother   . Heart disease Mother   . Mental illness Mother   . Hyperthyroidism Mother   . Hyperlipidemia Father   . Heart disease Father   . Transient ischemic attack Father   . Heart disease Sister   . Mental illness Sister   . Heart disease Maternal Uncle   . Rheum arthritis Maternal Grandmother     Social History  Substance Use Topics  . Smoking status: Former Smoker    Types: Cigarettes  . Smokeless tobacco: Never Used  . Alcohol Use: No    Allergies:  Allergies  Allergen Reactions  . Penicillins Shortness Of Breath, Swelling and Other (See Comments)    Has patient had a PCN reaction causing immediate rash, facial/tongue/throat swelling, SOB or lightheadedness with hypotension:Unknown Has patient had a PCN reaction causing severe rash involving mucus membranes or skin necrosis: unknown Has patient had a PCN reaction that required hospitalization unknown Has patient had a PCN reaction occurring within the last 10 years: uknown If all of the above answers are "NO", then may proceed with Cephalosporin use.     . Sulfa Antibiotics Hives and Rash    Prescriptions prior to admission  Medication Sig Dispense Refill Last Dose  . acetaminophen  (TYLENOL) 500 MG tablet Take 1,000 mg by mouth every 6 (six) hours as needed for mild pain.   Past Week at Unknown time  . cyclobenzaprine (FLEXERIL) 5 MG tablet Take 5 mg by mouth 3 (three) times daily as needed (tension headache).   1 Past Week at Unknown time  . diphenhydrAMINE (BENADRYL) 25 MG tablet Take 50 mg by mouth at bedtime as needed for allergies or sleep.    08/09/2015 at Unknown time  . doxylamine, Sleep, (UNISOM) 25 MG tablet Take 25 mg by mouth at bedtime as needed for sleep.    08/09/2015 at Unknown time  . Inulin (FIBER CHOICE PO) Take 4 each by mouth daily.   Past Week at Unknown time  . loratadine (CLARITIN) 10 MG tablet Take 10 mg by mouth daily as needed for allergies.   Past Week at Unknown time  . Prenatal Vit-Fe Fumarate-FA (PRENATAL MULTIVITAMIN) TABS tablet Take 1 tablet by mouth at bedtime.    08/09/2015 at Unknown time    Review of Systems  Gastrointestinal: Negative for abdominal pain.  Neurological: Positive for headaches. Negative for seizures.  All other systems reviewed and are negative.  Physical Exam   Blood pressure 134/75, pulse 92, temperature 97.9 F (36.6 C), resp. rate 18. Blood pressure 126/57, pulse 81, temperature 97.9 F (36.6 C), resp.  rate 18. Date/Time   Temp   Pulse   ECG Heart Rate   Resp   BP   Patient Position (if appropriate)   SpO2   O2 Device   Weight Who     08/10/15 1600  --  --  --  --  126/57 mmHg  --  --  --  -- GM   08/10/15 1545  --  --  --  --  136/70 mmHg  --  --  --  -- GM   08/10/15 1530  --  --  --  --  136/68 mmHg  --  --  --  -- GM   08/10/15 1515  --  --  --  --  148/73 mmHg  --  --  --  -- GM   08/10/15 1500  --  81  --  --  145/69 mmHg  --  --  --  -- LP   08/10/15 1445  --  88  --  --  138/78 mmHg  --  --  --  -- GM   08/10/15 1430  --  96  --  --  123/79 mmHg  --  --  --  -- GM   08/10/15 1415  --  98  --  --  119/100 mmHg  --  --  --  -- GM   08/10/15 1405  97.9 F (36.6 C)  92  --  18  134/75 mmHg   --  --  --  -- GM       Physical Exam  Nursing note and vitals reviewed. Constitutional: She is oriented to person, place, and time. She appears well-developed and well-nourished. No distress.  Cardiovascular: Normal rate.   Respiratory: Effort normal and breath sounds normal. No respiratory distress.  GI: Soft. She exhibits no distension and no mass. There is no tenderness. There is no rebound and no guarding.  Musculoskeletal: Normal range of motion. She exhibits edema.  Slight LE edema nonpitting  Neurological: She is alert and oriented to person, place, and time. She has normal reflexes.  Skin: Skin is warm and dry.  Psychiatric: She has a normal mood and affect. Her behavior is normal. Judgment and thought content normal.   Results for orders placed or performed during the hospital encounter of 08/10/15 (from the past 24 hour(s))  Urinalysis, Routine w reflex microscopic (not at [facility])     Status: Abnormal   Collection Time: 08/10/15  2:06 PM  Result Value Ref Range   Color, Urine YELLOW YELLOW   APPearance HAZY (A) CLEAR   Specific Gravity, Urine >1.030 (H) 1.005 - 1.030   pH 6.0 5.0 - 8.0   Glucose, UA NEGATIVE NEGATIVE mg/dL   Hgb urine dipstick TRACE (A) NEGATIVE   Bilirubin Urine NEGATIVE NEGATIVE   Ketones, ur 15 (A) NEGATIVE mg/dL   Protein, ur NEGATIVE NEGATIVE mg/dL   Nitrite NEGATIVE NEGATIVE   Leukocytes, UA NEGATIVE NEGATIVE  Urine microscopic-add on     Status: Abnormal   Collection Time: 08/10/15  2:06 PM  Result Value Ref Range   Squamous Epithelial / LPF 6-30 (A) NONE SEEN   WBC, UA 0-5 0 - 5 WBC/hpf   RBC / HPF 0-5 0 - 5 RBC/hpf   Bacteria, UA FEW (A) NONE SEEN   Urine-Other MUCOUS PRESENT   Protein / creatinine ratio, urine     Status: Abnormal   Collection Time: 08/10/15  2:13 PM  Result Value Ref Range   Creatinine, Urine 147.00  mg/dL   Total Protein, Urine 23 mg/dL   Protein Creatinine Ratio 0.16 (H) 0.00 - 0.15 mg/mg[Cre]  CBC     Status:  Abnormal   Collection Time: 08/10/15  3:24 PM  Result Value Ref Range   WBC 9.4 4.0 - 10.5 K/uL   RBC 3.68 (L) 3.87 - 5.11 MIL/uL   Hemoglobin 10.7 (L) 12.0 - 15.0 g/dL   HCT 40.9 (L) 81.1 - 91.4 %   MCV 87.2 78.0 - 100.0 fL   MCH 29.1 26.0 - 34.0 pg   MCHC 33.3 30.0 - 36.0 g/dL   RDW 78.2 95.6 - 21.3 %   Platelets 245 150 - 400 K/uL  Comprehensive metabolic panel     Status: Abnormal   Collection Time: 08/10/15  3:24 PM  Result Value Ref Range   Sodium 135 135 - 145 mmol/L   Potassium 4.1 3.5 - 5.1 mmol/L   Chloride 104 101 - 111 mmol/L   CO2 24 22 - 32 mmol/L   Glucose, Bld 110 (H) 65 - 99 mg/dL   BUN 10 6 - 20 mg/dL   Creatinine, Ser 0.86 (L) 0.44 - 1.00 mg/dL   Calcium 8.8 (L) 8.9 - 10.3 mg/dL   Total Protein 6.6 6.5 - 8.1 g/dL   Albumin 2.7 (L) 3.5 - 5.0 g/dL   AST 17 15 - 41 U/L   ALT 16 14 - 54 U/L   Alkaline Phosphatase 141 (H) 38 - 126 U/L   Total Bilirubin 0.4 0.3 - 1.2 mg/dL   GFR calc non Af Amer >60 >60 mL/min   GFR calc Af Amer >60 >60 mL/min   Anion gap 7 5 - 15  Lactate dehydrogenase     Status: None   Collection Time: 08/10/15  3:24 PM  Result Value Ref Range   LDH 126 98 - 192 U/L  Uric acid     Status: None   Collection Time: 08/10/15  3:24 PM  Result Value Ref Range   Uric Acid, Serum 4.6 2.3 - 6.6 mg/dL   MAU Course  Procedures  MDM Serial b/ps and Preeclamptic labs pending. Reported Labs and pt status to Dr. Marcelle Overlie. FHR reactive Category 1.Pt will be discharged with Preeclampsia precautions and is to schedule follow up appointment in the office for Monday 08/14/15  Assessment and Plan  Gestational Hypertension Follow up in office on 3/6 Preeclampsia Precautions Discharge to home  [facility] [place] 08/10/2015, 2:25 PM

## 2015-08-18 DIAGNOSIS — Z3A36 36 weeks gestation of pregnancy: Secondary | ICD-10-CM | POA: Diagnosis not present

## 2015-08-18 DIAGNOSIS — Z36 Encounter for antenatal screening of mother: Secondary | ICD-10-CM | POA: Diagnosis not present

## 2015-08-18 DIAGNOSIS — Z3400 Encounter for supervision of normal first pregnancy, unspecified trimester: Secondary | ICD-10-CM | POA: Diagnosis not present

## 2015-08-18 DIAGNOSIS — O9989 Other specified diseases and conditions complicating pregnancy, childbirth and the puerperium: Secondary | ICD-10-CM | POA: Diagnosis not present

## 2015-08-18 LAB — OB RESULTS CONSOLE GBS: STREP GROUP B AG: POSITIVE

## 2015-08-21 ENCOUNTER — Encounter (HOSPITAL_COMMUNITY): Payer: Self-pay | Admitting: *Deleted

## 2015-08-21 ENCOUNTER — Inpatient Hospital Stay (HOSPITAL_COMMUNITY)
Admission: AD | Admit: 2015-08-21 | Discharge: 2015-08-21 | Disposition: A | Discharge status: Home / Self Care | Payer: 59 | Source: Ambulatory Visit | Attending: Obstetrics and Gynecology | Admitting: Obstetrics and Gynecology

## 2015-08-21 DIAGNOSIS — O26893 Other specified pregnancy related conditions, third trimester: Secondary | ICD-10-CM | POA: Diagnosis not present

## 2015-08-21 DIAGNOSIS — Z880 Allergy status to penicillin: Secondary | ICD-10-CM | POA: Insufficient documentation

## 2015-08-21 DIAGNOSIS — Z3A36 36 weeks gestation of pregnancy: Secondary | ICD-10-CM | POA: Insufficient documentation

## 2015-08-21 DIAGNOSIS — F418 Other specified anxiety disorders: Secondary | ICD-10-CM | POA: Insufficient documentation

## 2015-08-21 DIAGNOSIS — O133 Gestational [pregnancy-induced] hypertension without significant proteinuria, third trimester: Secondary | ICD-10-CM | POA: Insufficient documentation

## 2015-08-21 DIAGNOSIS — Z87891 Personal history of nicotine dependence: Secondary | ICD-10-CM | POA: Insufficient documentation

## 2015-08-21 DIAGNOSIS — E559 Vitamin D deficiency, unspecified: Secondary | ICD-10-CM | POA: Diagnosis not present

## 2015-08-21 DIAGNOSIS — R51 Headache: Secondary | ICD-10-CM

## 2015-08-21 DIAGNOSIS — O99343 Other mental disorders complicating pregnancy, third trimester: Secondary | ICD-10-CM | POA: Diagnosis not present

## 2015-08-21 DIAGNOSIS — R519 Headache, unspecified: Secondary | ICD-10-CM

## 2015-08-21 DIAGNOSIS — R030 Elevated blood-pressure reading, without diagnosis of hypertension: Secondary | ICD-10-CM | POA: Diagnosis present

## 2015-08-21 LAB — CBC
HCT: 35.1 % — ABNORMAL LOW (ref 36.0–46.0)
Hemoglobin: 11.7 g/dL — ABNORMAL LOW (ref 12.0–15.0)
MCH: 28.5 pg (ref 26.0–34.0)
MCHC: 33.3 g/dL (ref 30.0–36.0)
MCV: 85.6 fL (ref 78.0–100.0)
PLATELETS: 272 10*3/uL (ref 150–400)
RBC: 4.1 MIL/uL (ref 3.87–5.11)
RDW: 14.3 % (ref 11.5–15.5)
WBC: 11.1 10*3/uL — ABNORMAL HIGH (ref 4.0–10.5)

## 2015-08-21 LAB — COMPREHENSIVE METABOLIC PANEL
ALK PHOS: 162 U/L — AB (ref 38–126)
ALT: 16 U/L (ref 14–54)
AST: 17 U/L (ref 15–41)
Albumin: 3.1 g/dL — ABNORMAL LOW (ref 3.5–5.0)
Anion gap: 7 (ref 5–15)
BUN: 12 mg/dL (ref 6–20)
CALCIUM: 8.9 mg/dL (ref 8.9–10.3)
CHLORIDE: 108 mmol/L (ref 101–111)
CO2: 19 mmol/L — ABNORMAL LOW (ref 22–32)
CREATININE: 0.35 mg/dL — AB (ref 0.44–1.00)
Glucose, Bld: 81 mg/dL (ref 65–99)
Potassium: 4 mmol/L (ref 3.5–5.1)
Sodium: 134 mmol/L — ABNORMAL LOW (ref 135–145)
Total Bilirubin: 0.4 mg/dL (ref 0.3–1.2)
Total Protein: 7.2 g/dL (ref 6.5–8.1)

## 2015-08-21 LAB — PROTEIN / CREATININE RATIO, URINE
CREATININE, URINE: 115 mg/dL
Protein Creatinine Ratio: 0.24 mg/mg{Cre} — ABNORMAL HIGH (ref 0.00–0.15)
TOTAL PROTEIN, URINE: 28 mg/dL

## 2015-08-21 MED ORDER — BUTALBITAL-APAP-CAFFEINE 50-325-40 MG PO TABS: 1.0000 | ORAL_TABLET | Freq: Four times a day (QID) | ORAL | Status: DC | PRN

## 2015-08-21 NOTE — Discharge Instructions (Signed)

## 2015-08-21 NOTE — MAU Provider Note (Signed)
History     CSN: [id number]  Arrival date and time: 08/21/15 1631   First Provider Initiated Contact with Patient 08/21/15 1700      Chief Complaint  Patient presents with  . Hypertension   HPI   Ms.Cynthia Townsend is a 35 y.o. female G2P0010 at 43w6dpresenting to MAU for elevated BP readings. The patient was seen in her OB's office today and had elevated Blood pressures.   OB History    Gravida Para Term Preterm AB TAB SAB Ectopic Multiple Living   2 0 0 0 1 0 0 1 0 0      Past Medical History  Diagnosis Date  . Anxiety   . ADD (attention deficit disorder)   . Sciatic pain   . Chicken pox   . Depression   . Subclinical hypothyroidism   . Insomnia   . Vitamin D deficiency   . Nasal polyps   . Ectopic pregnancy     Past Surgical History  Procedure Laterality Date  . Breast biopsy      benign    Family History  Problem Relation Age of Onset  . Alcohol abuse Mother   . Lung cancer Mother   . Hyperlipidemia Mother   . Heart disease Mother   . Mental illness Mother   . Hyperthyroidism Mother   . Hyperlipidemia Father   . Heart disease Father   . Transient ischemic attack Father   . Heart disease Sister   . Mental illness Sister   . Heart disease Maternal Uncle   . Rheum arthritis Maternal Grandmother     Social History  Substance Use Topics  . Smoking status: Former Smoker    Types: Cigarettes  . Smokeless tobacco: Never Used  . Alcohol Use: No    Allergies:  Allergies  Allergen Reactions  . Penicillins Shortness Of Breath, Swelling and Other (See Comments)    Has patient had a PCN reaction causing immediate rash, facial/tongue/throat swelling, SOB or lightheadedness with hypotension:Unknown Has patient had a PCN reaction causing severe rash involving mucus membranes or skin necrosis: unknown Has patient had a PCN reaction that required hospitalization unknown Has patient had a PCN reaction occurring within the last 10 years: uknown If all of  the above answers are "NO", then may proceed with Cephalosporin use.     . Adhesive [Tape] Rash  . Sulfa Antibiotics Hives and Rash    Prescriptions prior to admission  Medication Sig Dispense Refill Last Dose  . acetaminophen (TYLENOL) 500 MG tablet Take 1,000 mg by mouth every 6 (six) hours as needed for mild pain.   08/21/2015 at Unknown time  . cyclobenzaprine (FLEXERIL) 5 MG tablet Take 5 mg by mouth 3 (three) times daily as needed (tension headache).   1 Past Month at Unknown time  . diphenhydrAMINE (BENADRYL) 25 MG tablet Take 50 mg by mouth at bedtime as needed for allergies or sleep.    08/20/2015 at Unknown time  . doxylamine, Sleep, (UNISOM) 25 MG tablet Take 25 mg by mouth at bedtime as needed for sleep.    08/20/2015 at Unknown time  . Inulin (FIBER CHOICE PO) Take 4 each by mouth daily.   Past Week at Unknown time  . loratadine (CLARITIN) 10 MG tablet Take 10 mg by mouth daily as needed for allergies.   Past Week at Unknown time  . Prenatal Vit-Fe Fumarate-FA (PRENATAL MULTIVITAMIN) TABS tablet Take 1 tablet by mouth at bedtime.    08/20/2015 at Unknown time  Results for orders placed or performed during the hospital encounter of 08/21/15 (from the past 48 hour(s))  Protein / creatinine ratio, urine     Status: Abnormal   Collection Time: 08/21/15  4:37 PM  Result Value Ref Range   Creatinine, Urine 115.00 mg/dL   Total Protein, Urine 28 mg/dL    Comment: NO NORMAL RANGE ESTABLISHED FOR THIS TEST   Protein Creatinine Ratio 0.24 (H) 0.00 - 0.15 mg/mg[Cre]  CBC     Status: Abnormal   Collection Time: 08/21/15  4:43 PM  Result Value Ref Range   WBC 11.1 (H) 4.0 - 10.5 K/uL   RBC 4.10 3.87 - 5.11 MIL/uL   Hemoglobin 11.7 (L) 12.0 - 15.0 g/dL   HCT 35.1 (L) 36.0 - 46.0 %   MCV 85.6 78.0 - 100.0 fL   MCH 28.5 26.0 - 34.0 pg   MCHC 33.3 30.0 - 36.0 g/dL   RDW 14.3 11.5 - 15.5 %   Platelets 272 150 - 400 K/uL  Comprehensive metabolic panel     Status: Abnormal   Collection  Time: 08/21/15  4:43 PM  Result Value Ref Range   Sodium 134 (L) 135 - 145 mmol/L   Potassium 4.0 3.5 - 5.1 mmol/L   Chloride 108 101 - 111 mmol/L   CO2 19 (L) 22 - 32 mmol/L   Glucose, Bld 81 65 - 99 mg/dL   BUN 12 6 - 20 mg/dL   Creatinine, Ser 0.35 (L) 0.44 - 1.00 mg/dL   Calcium 8.9 8.9 - 10.3 mg/dL   Total Protein 7.2 6.5 - 8.1 g/dL   Albumin 3.1 (L) 3.5 - 5.0 g/dL   AST 17 15 - 41 U/L   ALT 16 14 - 54 U/L   Alkaline Phosphatase 162 (H) 38 - 126 U/L   Total Bilirubin 0.4 0.3 - 1.2 mg/dL   GFR calc non Af Amer >60 >60 mL/min   GFR calc Af Amer >60 >60 mL/min    Comment: (NOTE) The eGFR has been calculated using the CKD EPI equation. This calculation has not been validated in all clinical situations. eGFR's persistently <60 mL/min signify possible Chronic Kidney Disease.    Anion gap 7 5 - 15    Review of Systems  Constitutional: Negative for fever.  Eyes: Negative for blurred vision.  Gastrointestinal: Positive for abdominal pain (Right upper quadrant pain that started on Saturday. ).  Genitourinary: Negative for dysuria and urgency.  Neurological: Positive for headaches (Unrelieved by tylenol ).   Physical Exam   Blood pressure 143/86, pulse 97, height 5' 3" (1.6 m), weight 218 lb (98.884 kg).   Patient Vitals for the past 24 hrs:  BP Temp Temp src Pulse Resp Height Weight  08/21/15 1746 135/89 mmHg - - 112 18 - -  08/21/15 1743 135/89 mmHg - - 112 - - -  08/21/15 1733 135/85 mmHg - - 103 - - -  08/21/15 1723 131/84 mmHg - - 106 - - -  08/21/15 1713 131/82 mmHg - - 97 - - -  08/21/15 1703 132/86 mmHg - - 107 - - -  08/21/15 1656 143/86 mmHg 98.1 F (36.7 C) Oral 97 18 - -  08/21/15 1640 - - - - - 5' 3" (1.6 m) 218 lb (98.884 kg)    Physical Exam  Constitutional: She is oriented to person, place, and time. She appears well-developed and well-nourished. No distress.  HENT:  Head: Normocephalic.  Cardiovascular: Normal rate and normal heart sounds.  Respiratory: Effort normal and breath sounds normal. No respiratory distress.  GI: There is tenderness in the right upper quadrant and epigastric area. There is no rigidity and no guarding.  Musculoskeletal: Normal range of motion. She exhibits no edema or tenderness.  Neurological: She is alert and oriented to person, place, and time. She has normal reflexes.  Negative clonus   Skin: Skin is warm. She is not diaphoretic.  Psychiatric: Her behavior is normal.   Fetal Tracing: Baseline: 125 bpm Variability: Moderate  Accelerations: 15x15 Decelerations: None Toco: UI   MAU Course  Procedures  None  MDM  NST Protein/creatinine ratio UA CBC CMP   Discussed patient with Dr. Julien Girt discussed labs> HPI> and physical exam   Assessment and Plan   A:  1. Pregnancy induced hypertension, third trimester   2. Headache in pregnancy, antepartum, third trimester     P:  Discharge home in stable condition RX: Fioricet  Call the office tomorrow to schedule a BP check.  Fetal kick counts Return to MAU if symptoms worsen Preeclampsia precautions    Lezlie Lye, NP 08/21/2015 6:06 PM

## 2015-08-21 NOTE — MAU Note (Signed)
Sent from office for pre-eclampsia eval.  +protein in urine.

## 2015-08-23 DIAGNOSIS — Z36 Encounter for antenatal screening of mother: Secondary | ICD-10-CM | POA: Diagnosis not present

## 2015-08-25 DIAGNOSIS — Z3A37 37 weeks gestation of pregnancy: Secondary | ICD-10-CM | POA: Diagnosis not present

## 2015-08-25 DIAGNOSIS — O26893 Other specified pregnancy related conditions, third trimester: Secondary | ICD-10-CM | POA: Diagnosis not present

## 2015-08-25 DIAGNOSIS — Z3403 Encounter for supervision of normal first pregnancy, third trimester: Secondary | ICD-10-CM | POA: Diagnosis not present

## 2015-08-28 ENCOUNTER — Encounter (HOSPITAL_COMMUNITY): Payer: Self-pay | Admitting: *Deleted

## 2015-08-28 ENCOUNTER — Telehealth (HOSPITAL_COMMUNITY): Payer: Self-pay | Admitting: *Deleted

## 2015-08-28 DIAGNOSIS — Z36 Encounter for antenatal screening of mother: Secondary | ICD-10-CM | POA: Diagnosis not present

## 2015-08-28 NOTE — Telephone Encounter (Signed)
Preadmission screen  

## 2015-08-31 ENCOUNTER — Inpatient Hospital Stay (HOSPITAL_COMMUNITY): Payer: 59 | Admitting: Anesthesiology

## 2015-08-31 ENCOUNTER — Inpatient Hospital Stay (HOSPITAL_COMMUNITY)
Admission: RE | Admit: 2015-08-31 | Discharge: 2015-09-03 | DRG: 775 | Disposition: A | Discharge status: Home / Self Care | Payer: 59 | Source: Ambulatory Visit | Attending: Obstetrics and Gynecology | Admitting: Obstetrics and Gynecology

## 2015-08-31 ENCOUNTER — Encounter (HOSPITAL_COMMUNITY): Payer: Self-pay

## 2015-08-31 DIAGNOSIS — Z3A38 38 weeks gestation of pregnancy: Secondary | ICD-10-CM

## 2015-08-31 DIAGNOSIS — O99284 Endocrine, nutritional and metabolic diseases complicating childbirth: Secondary | ICD-10-CM | POA: Diagnosis present

## 2015-08-31 DIAGNOSIS — O133 Gestational [pregnancy-induced] hypertension without significant proteinuria, third trimester: Principal | ICD-10-CM | POA: Diagnosis present

## 2015-08-31 DIAGNOSIS — E02 Subclinical iodine-deficiency hypothyroidism: Secondary | ICD-10-CM | POA: Diagnosis present

## 2015-08-31 DIAGNOSIS — E559 Vitamin D deficiency, unspecified: Secondary | ICD-10-CM | POA: Diagnosis present

## 2015-08-31 DIAGNOSIS — O99820 Streptococcus B carrier state complicating pregnancy: Secondary | ICD-10-CM | POA: Diagnosis present

## 2015-08-31 DIAGNOSIS — Z87891 Personal history of nicotine dependence: Secondary | ICD-10-CM

## 2015-08-31 DIAGNOSIS — Z825 Family history of asthma and other chronic lower respiratory diseases: Secondary | ICD-10-CM

## 2015-08-31 DIAGNOSIS — O134 Gestational [pregnancy-induced] hypertension without significant proteinuria, complicating childbirth: Secondary | ICD-10-CM | POA: Diagnosis present

## 2015-08-31 DIAGNOSIS — O139 Gestational [pregnancy-induced] hypertension without significant proteinuria, unspecified trimester: Secondary | ICD-10-CM | POA: Diagnosis present

## 2015-08-31 DIAGNOSIS — Z8249 Family history of ischemic heart disease and other diseases of the circulatory system: Secondary | ICD-10-CM

## 2015-08-31 LAB — CBC
HCT: 32.9 % — ABNORMAL LOW (ref 36.0–46.0)
HCT: 31.5 % — ABNORMAL LOW (ref 36.0–46.0)
HEMOGLOBIN: 10.7 g/dL — AB (ref 12.0–15.0)
Hemoglobin: 10.3 g/dL — ABNORMAL LOW (ref 12.0–15.0)
MCH: 27.8 pg (ref 26.0–34.0)
MCH: 27.8 pg (ref 26.0–34.0)
MCHC: 32.5 g/dL (ref 30.0–36.0)
MCHC: 32.7 g/dL (ref 30.0–36.0)
MCV: 85.5 fL (ref 78.0–100.0)
MCV: 85.1 fL (ref 78.0–100.0)
PLATELETS: 278 10*3/uL (ref 150–400)
PLATELETS: 212 10*3/uL (ref 150–400)
RBC: 3.85 MIL/uL — ABNORMAL LOW (ref 3.87–5.11)
RBC: 3.7 MIL/uL — ABNORMAL LOW (ref 3.87–5.11)
RDW: 14.5 % (ref 11.5–15.5)
RDW: 14.5 % (ref 11.5–15.5)
WBC: 10.9 10*3/uL — ABNORMAL HIGH (ref 4.0–10.5)
WBC: 10 10*3/uL (ref 4.0–10.5)

## 2015-08-31 LAB — TYPE AND SCREEN
ABO/RH(D): A NEG
ANTIBODY SCREEN: NEGATIVE

## 2015-08-31 LAB — RPR: RPR Ser Ql: NONREACTIVE

## 2015-08-31 LAB — ABO/RH: ABO/RH(D): A NEG

## 2015-08-31 MED ORDER — EPHEDRINE 5 MG/ML INJ
10.0000 mg | INTRAVENOUS | Status: DC | PRN
Start: 2015-08-31 — End: 2015-09-01
  Filled 2015-08-31: qty 2

## 2015-08-31 MED ORDER — OXYTOCIN 10 UNIT/ML IJ SOLN
1.0000 m[IU]/min | INTRAVENOUS | Status: DC
  Administered 2015-08-31: 2 m[IU]/min via INTRAVENOUS

## 2015-08-31 MED ORDER — OXYCODONE-ACETAMINOPHEN 5-325 MG PO TABS: 2.0000 | ORAL_TABLET | ORAL | Status: DC | PRN

## 2015-08-31 MED ORDER — EPHEDRINE 5 MG/ML INJ
10.0000 mg | INTRAVENOUS | Status: DC | PRN
  Filled 2015-08-31: qty 2

## 2015-08-31 MED ORDER — DIPHENHYDRAMINE HCL 50 MG/ML IJ SOLN
12.5000 mg | INTRAMUSCULAR | Status: AC | PRN
  Administered 2015-09-01 (×3): 12.5 mg via INTRAVENOUS
  Filled 2015-08-31: qty 1

## 2015-08-31 MED ORDER — PHENYLEPHRINE 40 MCG/ML (10ML) SYRINGE FOR IV PUSH (FOR BLOOD PRESSURE SUPPORT)
80.0000 ug | PREFILLED_SYRINGE | INTRAVENOUS | Status: DC | PRN
  Filled 2015-08-31: qty 2
  Filled 2015-08-31 (×2): qty 20

## 2015-08-31 MED ORDER — ZOLPIDEM TARTRATE 5 MG PO TABS
5.0000 mg | ORAL_TABLET | Freq: Every evening | ORAL | Status: DC | PRN
  Administered 2015-08-31: 5 mg via ORAL
  Filled 2015-08-31: qty 1

## 2015-08-31 MED ORDER — LACTATED RINGERS IV SOLN
INTRAVENOUS | Status: DC
  Administered 2015-08-31 – 2015-09-01 (×4): via INTRAVENOUS

## 2015-08-31 MED ORDER — TERBUTALINE SULFATE 1 MG/ML IJ SOLN
0.2500 mg | Freq: Once | INTRAMUSCULAR | Status: DC | PRN
  Filled 2015-08-31: qty 1

## 2015-08-31 MED ORDER — ACETAMINOPHEN 325 MG PO TABS
650.0000 mg | ORAL_TABLET | ORAL | Status: DC | PRN
  Administered 2015-08-31: 650 mg via ORAL
  Filled 2015-08-31: qty 2

## 2015-08-31 MED ORDER — ONDANSETRON HCL 4 MG/2ML IJ SOLN
4.0000 mg | Freq: Four times a day (QID) | INTRAMUSCULAR | Status: DC | PRN
  Administered 2015-08-31: 4 mg via INTRAVENOUS
  Filled 2015-08-31: qty 2

## 2015-08-31 MED ORDER — MISOPROSTOL 25 MCG QUARTER TABLET
25.0000 ug | ORAL_TABLET | ORAL | Status: DC | PRN
  Administered 2015-08-31 (×2): 25 ug via VAGINAL
  Filled 2015-08-31: qty 0.25
  Filled 2015-08-31: qty 1
  Filled 2015-08-31 (×2): qty 0.25

## 2015-08-31 MED ORDER — OXYCODONE-ACETAMINOPHEN 5-325 MG PO TABS: 1.0000 | ORAL_TABLET | ORAL | Status: DC | PRN

## 2015-08-31 MED ORDER — LACTATED RINGERS IV SOLN: 500.0000 mL | INTRAVENOUS | Status: DC | PRN

## 2015-08-31 MED ORDER — OXYTOCIN 10 UNIT/ML IJ SOLN
2.5000 [IU]/h | INTRAVENOUS | Status: DC
  Filled 2015-08-31: qty 10

## 2015-08-31 MED ORDER — CLINDAMYCIN PHOSPHATE 900 MG/50ML IV SOLN
900.0000 mg | Freq: Three times a day (TID) | INTRAVENOUS | Status: DC
  Administered 2015-08-31 – 2015-09-01 (×5): 900 mg via INTRAVENOUS
  Filled 2015-08-31 (×8): qty 50

## 2015-08-31 MED ORDER — OXYTOCIN BOLUS FROM INFUSION
500.0000 mL | INTRAVENOUS | Status: DC
  Administered 2015-09-01: 500 mL via INTRAVENOUS

## 2015-08-31 MED ORDER — FENTANYL 2.5 MCG/ML BUPIVACAINE 1/10 % EPIDURAL INFUSION (WH - ANES)
14.0000 mL/h | INTRAMUSCULAR | Status: DC | PRN
  Administered 2015-08-31 – 2015-09-01 (×4): 14 mL/h via EPIDURAL
  Filled 2015-08-31 (×4): qty 125

## 2015-08-31 MED ORDER — LACTATED RINGERS IV SOLN: 500.0000 mL | Freq: Once | INTRAVENOUS | Status: DC

## 2015-08-31 MED ORDER — FENTANYL 2.5 MCG/ML BUPIVACAINE 1/10 % EPIDURAL INFUSION (WH - ANES): 14.0000 mL/h | INTRAMUSCULAR | Status: DC | PRN

## 2015-08-31 MED ORDER — LIDOCAINE HCL (PF) 1 % IJ SOLN
30.0000 mL | INTRAMUSCULAR | Status: DC | PRN
  Administered 2015-09-01: 30 mL via SUBCUTANEOUS
  Filled 2015-08-31: qty 30

## 2015-08-31 MED ORDER — LIDOCAINE HCL (PF) 1 % IJ SOLN
INTRAMUSCULAR | Status: DC | PRN
  Administered 2015-08-31 (×2): 4 mL

## 2015-08-31 MED ORDER — PHENYLEPHRINE 40 MCG/ML (10ML) SYRINGE FOR IV PUSH (FOR BLOOD PRESSURE SUPPORT)
80.0000 ug | PREFILLED_SYRINGE | INTRAVENOUS | Status: DC | PRN
  Filled 2015-08-31: qty 2

## 2015-08-31 MED ORDER — CITRIC ACID-SODIUM CITRATE 334-500 MG/5ML PO SOLN: 30.0000 mL | ORAL | Status: DC | PRN

## 2015-08-31 NOTE — H&P (Signed)
Cynthia Townsend is a 35 y.o. female G2P0@ 38.5 weeks presenting for IOL.  Pt with gestational HTN - BP in office 140s/90s.  Last cytotec @ 630am  History OB History    Gravida Para Term Preterm AB TAB SAB Ectopic Multiple Living   2 0 0 0 1 0 0 1 0 0      Past Medical History  Diagnosis Date  . Anxiety   . ADD (attention deficit disorder)   . Sciatic pain   . Chicken pox   . Depression   . Subclinical hypothyroidism   . Insomnia   . Vitamin D deficiency   . Nasal polyps   . Ectopic pregnancy   . Pregnancy induced hypertension    Past Surgical History  Procedure Laterality Date  . Breast biopsy      benign   Family History: family history includes Alcohol abuse in her mother; COPD in her mother; Heart disease in her father, maternal uncle, mother, and sister; Hyperlipidemia in her father and mother; Hyperthyroidism in her mother; Lung cancer in her mother; Mental illness in her mother and sister; Pulmonary embolism in her father; Rheum arthritis in her maternal grandmother; Transient ischemic attack in her father. Social History:  reports that she has quit smoking. Her smoking use included Cigarettes. She has never used smokeless tobacco. She reports that she does not drink alcohol or use illicit drugs.   Prenatal Transfer Tool  Maternal Diabetes: No Genetic Screening: Normal Maternal Ultrasounds/Referrals: Normal Fetal Ultrasounds or other Referrals:  None Maternal Substance Abuse:  No Significant Maternal Medications:  None Significant Maternal Lab Results:  None Other Comments:  None  ROS  Dilation: Fingertip Effacement (%): 60 Station: Ballotable Exam by:: Genelle BalE. Johnson, RN  Blood pressure 130/70, pulse 87, temperature 98.4 F (36.9 C), temperature source Oral, resp. rate 20, height 5\' 3"  (1.6 m), weight 220 lb (99.791 kg). Exam Physical Exam  gen - NAD Abd - gravid, NT  EFW 7.5# Ext - NT, no edema Cvx 0.5cm at last check  Prenatal labs: ABO, Rh: --/--/A NEG, A  NEG (03/23 0200) Antibody: NEG (03/23 0200) Rubella: Immune (09/07 0000) RPR: Nonreactive (09/07 0000)  HBsAg: Negative (09/07 0000)  HIV: Non-reactive (09/07 0000)  GBS: Positive (03/10 0000)   Assessment/Plan: Admit cytotec IOL clinda for GBS   Carlia Bomkamp 08/31/2015, 8:32 AM

## 2015-08-31 NOTE — Progress Notes (Signed)
Pt comfortable  FHT cat 1 Cvx 1/60/-2, vtx  A/P:  Will start pitocin Epidural prn clinda

## 2015-08-31 NOTE — Anesthesia Procedure Notes (Signed)
Epidural Patient location during procedure: OB  Staffing Anesthesiologist: Miasha Emmons Performed by: anesthesiologist   Preanesthetic Checklist Completed: patient identified, pre-op evaluation, timeout performed, IV checked, risks and benefits discussed and monitors and equipment checked  Epidural Patient position: sitting Prep: site prepped and draped and DuraPrep Patient monitoring: heart rate Approach: midline Location: L3-L4 Injection technique: LOR air and LOR saline  Needle:  Needle type: Tuohy  Needle gauge: 17 G Needle length: 9 cm Needle insertion depth: 7 cm Catheter type: closed end flexible Catheter size: 19 Gauge Catheter at skin depth: 13 cm Test dose: negative  Assessment Sensory level: T8 Events: blood not aspirated, injection not painful, no injection resistance, negative IV test and no paresthesia  Additional Notes Reason for block:procedure for pain   

## 2015-09-01 ENCOUNTER — Encounter (HOSPITAL_COMMUNITY): Payer: Self-pay

## 2015-09-01 LAB — CBC
HCT: 32.5 % — ABNORMAL LOW (ref 36.0–46.0)
HEMOGLOBIN: 10.8 g/dL — AB (ref 12.0–15.0)
MCH: 28.3 pg (ref 26.0–34.0)
MCHC: 33.2 g/dL (ref 30.0–36.0)
MCV: 85.1 fL (ref 78.0–100.0)
PLATELETS: 238 10*3/uL (ref 150–400)
RBC: 3.82 MIL/uL — AB (ref 3.87–5.11)
RDW: 14.5 % (ref 11.5–15.5)
WBC: 18.7 10*3/uL — AB (ref 4.0–10.5)

## 2015-09-01 MED ORDER — OXYCODONE-ACETAMINOPHEN 5-325 MG PO TABS
2.0000 | ORAL_TABLET | ORAL | Status: DC | PRN
  Administered 2015-09-02 – 2015-09-03 (×5): 2 via ORAL
  Filled 2015-09-01 (×5): qty 2

## 2015-09-01 MED ORDER — PRENATAL MULTIVITAMIN CH
1.0000 | ORAL_TABLET | Freq: Every day | ORAL | Status: DC
  Administered 2015-09-02: 1 via ORAL
  Filled 2015-09-01: qty 1

## 2015-09-01 MED ORDER — BENZOCAINE-MENTHOL 20-0.5 % EX AERO
1.0000 "application " | INHALATION_SPRAY | CUTANEOUS | Status: DC | PRN
  Administered 2015-09-01: 1 via TOPICAL
  Filled 2015-09-01: qty 56

## 2015-09-01 MED ORDER — SENNOSIDES-DOCUSATE SODIUM 8.6-50 MG PO TABS
2.0000 | ORAL_TABLET | ORAL | Status: DC
  Administered 2015-09-01 – 2015-09-03 (×2): 2 via ORAL
  Filled 2015-09-01 (×2): qty 2

## 2015-09-01 MED ORDER — DIBUCAINE 1 % RE OINT: 1.0000 "application " | TOPICAL_OINTMENT | RECTAL | Status: DC | PRN

## 2015-09-01 MED ORDER — SIMETHICONE 80 MG PO CHEW
80.0000 mg | CHEWABLE_TABLET | ORAL | Status: DC | PRN
Start: 2015-09-01 — End: 2015-09-03

## 2015-09-01 MED ORDER — IBUPROFEN 600 MG PO TABS
600.0000 mg | ORAL_TABLET | Freq: Four times a day (QID) | ORAL | Status: DC
  Administered 2015-09-01 – 2015-09-03 (×7): 600 mg via ORAL
  Filled 2015-09-01 (×7): qty 1

## 2015-09-01 MED ORDER — ACETAMINOPHEN 325 MG PO TABS
650.0000 mg | ORAL_TABLET | ORAL | Status: DC | PRN
Start: 2015-09-01 — End: 2015-09-03
  Administered 2015-09-01: 650 mg via ORAL
  Filled 2015-09-01: qty 2

## 2015-09-01 MED ORDER — ONDANSETRON HCL 4 MG PO TABS
4.0000 mg | ORAL_TABLET | ORAL | Status: DC | PRN
Start: 2015-09-01 — End: 2015-09-03

## 2015-09-01 MED ORDER — SODIUM BICARBONATE 8.4 % IV SOLN
INTRAVENOUS | Status: DC | PRN
  Administered 2015-09-01: 3 mL via EPIDURAL

## 2015-09-01 MED ORDER — TETANUS-DIPHTH-ACELL PERTUSSIS 5-2.5-18.5 LF-MCG/0.5 IM SUSP: 0.5000 mL | Freq: Once | INTRAMUSCULAR | Status: DC

## 2015-09-01 MED ORDER — ONDANSETRON HCL 4 MG/2ML IJ SOLN
4.0000 mg | INTRAMUSCULAR | Status: DC | PRN
Start: 2015-09-01 — End: 2015-09-03

## 2015-09-01 MED ORDER — BUPIVACAINE HCL (PF) 0.25 % IJ SOLN
INTRAMUSCULAR | Status: DC | PRN
  Administered 2015-09-01: 3 mL via EPIDURAL

## 2015-09-01 MED ORDER — OXYCODONE-ACETAMINOPHEN 5-325 MG PO TABS: 1.0000 | ORAL_TABLET | ORAL | Status: DC | PRN

## 2015-09-01 MED ORDER — LANOLIN HYDROUS EX OINT: TOPICAL_OINTMENT | CUTANEOUS | Status: DC | PRN

## 2015-09-01 MED ORDER — DIPHENHYDRAMINE HCL 25 MG PO CAPS: 25.0000 mg | ORAL_CAPSULE | Freq: Four times a day (QID) | ORAL | Status: DC | PRN

## 2015-09-01 MED ORDER — ZOLPIDEM TARTRATE 5 MG PO TABS: 5.0000 mg | ORAL_TABLET | Freq: Every evening | ORAL | Status: DC | PRN

## 2015-09-01 MED ORDER — WITCH HAZEL-GLYCERIN EX PADS: 1.0000 "application " | MEDICATED_PAD | CUTANEOUS | Status: DC | PRN

## 2015-09-01 NOTE — Anesthesia Preprocedure Evaluation (Signed)
Anesthesia Evaluation    Airway       Dental   Pulmonary former smoker,          Cardiovascular hypertension,     Neuro/Psych    GI/Hepatic   Endo/Other    Renal/GU      Musculoskeletal   Abdominal   Peds  Hematology   Anesthesia Other Findings   Reproductive/Obstetrics                          Anesthesia Physical Anesthesia Plan Anesthesia Quick Evaluation  

## 2015-09-01 NOTE — Progress Notes (Signed)
Pt s/p epidural. Comfortable  FHT reassuring Cvx 1cm  A/P:  Plan to d/c pitocin and let pt eat dinner Restart pitocin after dinner

## 2015-09-01 NOTE — Progress Notes (Signed)
No HA or epigastric pain BP stable Pitocin on Cx 2/90/-2/vtx AROM clear UCs q3 min FHT some early shallow decels

## 2015-09-01 NOTE — Progress Notes (Signed)
Delivery Note At 2:50 PM a viable female was delivered via Vaginal, Spontaneous Delivery (Presentation: Right Occiput Anterior).  APGAR: 8, 9; weight  .   Placenta status: Intact, Spontaneous.  Cord: 3 vessels with the following complications: None.  Cord pH: not done  Anesthesia: Epidural  Episiotomy: Median-second degree-repaired Lacerations:   Suture Repair: 2.0 vicryl rapide Est. Blood Loss (mL):  250   Mom to postpartum.  Baby to Couplet care / Skin to Skin.  Arn Mcomber II,Naquisha Whitehair E 09/01/2015, 3:05 PM

## 2015-09-01 NOTE — Progress Notes (Signed)
Epidural in Cx 4/C/-2 IUPC placed FHT cat one

## 2015-09-01 NOTE — Anesthesia Postprocedure Evaluation (Signed)
Anesthesia Post Note  Patient: Shana ChuteKeasha Foley  Procedure(s) Performed: * No procedures listed *  Patient location during evaluation: Mother Baby Anesthesia Type: Epidural Level of consciousness: awake and alert Pain management: satisfactory to patient Vital Signs Assessment: post-procedure vital signs reviewed and stable Respiratory status: respiratory function stable Cardiovascular status: stable Postop Assessment: no headache, no backache, epidural receding, patient able to bend at knees, no signs of nausea or vomiting and adequate PO intake Anesthetic complications: no Comments: Comfort level was assessed by AnesthesiaTeam and the patient was pleased with the care, interventions, and services provided by the Department of Anesthesia.    Last Vitals:  Filed Vitals:   09/01/15 1608 09/01/15 1710  BP: 130/77 144/78  Pulse: 99 95  Temp:  37.1 C  Resp: 16 20    Last Pain:  Filed Vitals:   09/01/15 1859  PainSc: 5                  Donnika Kucher

## 2015-09-02 LAB — CBC
HCT: 26 % — ABNORMAL LOW (ref 36.0–46.0)
Hemoglobin: 8.5 g/dL — ABNORMAL LOW (ref 12.0–15.0)
MCH: 28.1 pg (ref 26.0–34.0)
MCHC: 32.7 g/dL (ref 30.0–36.0)
MCV: 85.8 fL (ref 78.0–100.0)
PLATELETS: 205 10*3/uL (ref 150–400)
RBC: 3.03 MIL/uL — ABNORMAL LOW (ref 3.87–5.11)
RDW: 14.7 % (ref 11.5–15.5)
WBC: 12.1 10*3/uL — ABNORMAL HIGH (ref 4.0–10.5)

## 2015-09-02 NOTE — Lactation Note (Signed)
This note was copied from a baby's chart. Lactation Consultation Note  Initial visit made.  Breastfeeding consultation services and support information given and reviewed.  Mom states she has some areolar edema which has made latch difficult.  She has been using a 20 mm nipple shield and baby latching easily.  Reviewed reverse pressure and breast massage.  Manual pump given to pre pump for easier latch and post pump for stimulation.  Mom took breastfeeding class and very motivated.  Encouraged to call out for concerns/assist prn.  Patient Name: Cynthia Shana ChuteKeasha Simmering BMWUX'LToday's Date: 09/02/2015 Reason for consult: Initial assessment   Maternal Data Has patient been taught Hand Expression?: Yes Does the patient have breastfeeding experience prior to this delivery?: No  Feeding    LATCH Score/Interventions                      Lactation Tools Discussed/Used Tools: Nipple Shields Nipple shield size: 20 Pump Review: Setup, frequency, and cleaning Initiated by:: LC Date initiated:: 09/02/15   Consult Status Consult Status: Follow-up Date: 09/03/15 Follow-up type: In-patient    [name], Kallee Nam S 09/02/2015, 2:48 PM

## 2015-09-02 NOTE — Clinical Social Work Maternal (Signed)
CLINICAL SOCIAL WORK MATERNAL/CHILD NOTE  Patient Details  Name: Cynthia Townsend Andujo MRN: [id number] Date of Birth: 02/23/1981  Date:  09/02/2015  Clinical Social Worker Initiating Note:  Loleta BooksSarah Kenyatte Chatmon MSW, LCSW Date/ Time Initiated:  09/02/15/1040     Child's Name:  [name] Belle   Legal Guardian: Cynthia Townsend Buswell and Arlys JohnBrian  Need for Interpreter:  None   Date of Referral:  09/01/15     Reason for Referral:  History of anxiety and depression  [facility]   Address[place]  [address] [place], [place] [address]  Phone number:  [phone]   Household Members:  Significant Other   Natural Supports (not living in the home):  Extended Family, Immediate Family, Friends   Professional Supports: None   Employment: Full-time   Type of Work: [employer]   Education:    N/A  Architectinancial Resources:  Media plannerrivate Insurance   Other Resources:    None identified   Cultural/Religious Considerations Which May Impact Care:  None reported  Strengths:  Ability to meet basic needs , Home prepared for child , Pediatrician chosen    Risk Factors/Current Problems:   1. Mental Health Concerns-- MOB presents with a history of anxiety and depression. MOB reported onset of anxiety as a child, and reported onset of depression approximately 10 years ago. MOB reported history of being prescribed Xanax, but denied any medication since she learned of pregnancy at [redacted] weeks gestation.    Cognitive State:  Able to Concentrate , Alert , Goal Oriented , Linear Thinking , Insightful    Mood/Affect:  Happy , Animated, Calm , Bright    CSW Assessment:  MOB received request for consult due to MOB presenting with a history of anxiety and depression. MOB presented as easily engaged and receptive to the visit. She was noted to be in a pleasant mood and displayed a full range in affect. Limited time required prior to establishing rapport with MOB.   MOB smiled as she reflected upon her thoughts and  feelings secondary to transition postpartum.  MOB stated that she has been wanting to become a mother for a long time, and is excited and happy about the infant's birth.  MOB discussed perceptions that childbirth is what she anticipated and expected. Per MOB, she has two close friends that have recently had children, and has spoken with her co-workers about their childbirth stories. MOB identified these conversations as helpful since she was able to learn about the wide ranges of experiences giving birth, and shared that she was then not overwhelmed when she began to feel nauseous, etc., since she knew that it could be normal.   MOB continued to express gratitude for how well breastfeeding is going. She stated that she had been "prepped" for it to be potentially challenging and difficult, and shared that she is glad that all is well.  MOB reported that the home is prepared for the infant, and she showed CSW the pictures of the nursery. MOB shared that it has been a pleasant experience preparing for the infant, and that she is looking forward to taking the infant home.  Per MOB, the infant was born on the 5th anniversary of her sister's death. MOB became appropriately tearful as she discussed this, but she shared that she feels blessed and believes that it is her sister's way of looking out for them.  She identified her child's birth on the anniversary of her sister's death as a positive event.  MOB confirmed history of depression  and anxiety. Per MOB, there is a strong family history of anxiety, and she reported that she began to notice anxiety in childhood. MOB stated that she noted onset of depression approximately 10 years ago. Per MOB, she has a history of being prescribed Xanax by her PCP.  MOB presents with insight about her triggers for anxiety, as she discussed presence of anxiety as she arrived at work each morning since she never knew what to anticipate and expect at work.  MOB also discussed how certain  patients at work could be difficult for her anxiety based on her childhood history.  MOB recognized how her insight on what triggers her anxiety is a strength since it allows her to be prepared and to have a plan when she experiences a potential trigger. Per MOB, she discontinued her Xanax when she learned that she was pregnant at [redacted] weeks. MOB stated that she was surprised that she did not experience any acute symptoms of anxiety without the medication.  She denied presence of any changes that may be correlated to improvement in symptoms, but expressed hope that the trend will continue.    MOB presented as eager to learn about perinatal mood and anxiety disorders. She stated that she has been nervous and worried about perinatal mood and anxiety disorders based on her history and based on her friends' experiences.  She stated that she has spoken to her PCP about her concerns, and her PCP has recommended to follow up with him if she begins to feel concerned about her mood. MOB expressed desire to not have medications unless necessary, but is not opposed if she notes negative outcomes as a result of her symptoms.  MOB was receptive to education on the baby blues and perinatal mood disorders. CSW highlighted common signs and symptoms, and explored with MOB indicators that may demonstrate need to seek help and treatment.  MOB expressed appreciation for the information, agreed to closely monitor her mood, and to follow up with her medical provider as needs arise.  MOB stated that her friends have been helpful during this role transition as they have been providing support and informing her of what to anticipate and expect postpartum. MOB presented with an awareness of the positive and negative aspects of parenting, including the temporal nature of stress, anxiety, and worry. MOB verbalized awareness that each day is a unique day, and that feelings can pass. She expressed appreciation for her friends who have created a  safe environment to openly discuss how they are feeling postpartum, and she discussed feelings of confidence that she will be able to talk to people about how she is feeling if needed.    MOB expressed confidence in her ability to closely monitor her mood. She recognized that she presents with an increased risk, but if she experiences symptoms she has support from friends, family, and medical providers. MOB acknowledged presence of evidence based treatments, and expressed awareness that she will be "okay" since her friends who have experienced symptoms have survived.   MOB denied questions, concerns, or needs at this time. She expressed appreciation for the visit and support, and agreed to contact CSW if additional needs arise.  CSW Plan/Description:  Patient/Family Education , No Further Intervention Required/No Barriers to Discharge    Kelby Fam 09/02/2015, 11:14 AM

## 2015-09-02 NOTE — Progress Notes (Signed)
Post Partum Day 1 Subjective: no complaints, up ad lib, voiding and tolerating PO  Objective: Blood pressure 128/67, pulse 81, temperature 97.4 F (36.3 C), temperature source Oral, resp. rate 18, height 5\' 3"  (1.6 m), weight 220 lb (99.791 kg), SpO2 100 %, unknown if currently breastfeeding.  Physical Exam:  General: alert, cooperative and no distress Lochia: appropriate Uterine Fundus: firm Incision: healing well DVT Evaluation: No evidence of DVT seen on physical exam.   Recent Labs  09/01/15 1636 09/02/15 0536  HGB 10.8* 8.5*  HCT 32.5* 26.0*    Assessment/Plan: Plan for discharge tomorrow   LOS: 2 days   Grissel Tyrell II,Aly Hauser E 09/02/2015, 8:33 AM

## 2015-09-03 MED ORDER — FERROUS SULFATE 325 (65 FE) MG PO TABS: 325.0000 mg | ORAL_TABLET | Freq: Two times a day (BID) | ORAL | Status: DC

## 2015-09-03 MED ORDER — OXYCODONE-ACETAMINOPHEN 5-325 MG PO TABS: 1.0000 | ORAL_TABLET | Freq: Four times a day (QID) | ORAL | Status: DC | PRN

## 2015-09-03 MED ORDER — IBUPROFEN 600 MG PO TABS: 600.0000 mg | ORAL_TABLET | Freq: Four times a day (QID) | ORAL | Status: DC | PRN

## 2015-09-03 NOTE — Progress Notes (Signed)
Post Partum Day 2 Subjective: no complaints, up ad lib, voiding, tolerating PO and + flatus  Objective: Blood pressure 129/75, pulse 96, temperature 98 F (36.7 C), temperature source Oral, resp. rate 20, height 5\' 3"  (1.6 m), weight 220 lb (99.791 kg), SpO2 100 %, unknown if currently breastfeeding.  Physical Exam:  General: alert, cooperative and no distress Lochia: appropriate Uterine Fundus: firm Incision: healing well DVT Evaluation: No evidence of DVT seen on physical exam.   Recent Labs  09/01/15 1636 09/02/15 0536  HGB 10.8* 8.5*  HCT 32.5* 26.0*    Assessment/Plan: Discharge home   LOS: 3 days   Kenaz Olafson II,Jamine Wingate E 09/03/2015, 9:06 AM

## 2015-09-03 NOTE — Discharge Instructions (Signed)
No heavy lifting No vaginal entry

## 2015-09-03 NOTE — Lactation Note (Signed)
This note was copied from a baby's chart. Lactation Consultation Note  Mom states baby cluster fed last night.  Breasts are full this AM but not engorged.  Observed baby on breast with 20 mm nipple shield and baby nursing actively but somewhat shallow.  Nipple shield increased to a 24 mm and baby opened wider making latch deeper.  Shield full of milk when baby came off.  Mom is [employer] employee and received a medela double electric breast pump.  Pump assembled and instructed on use and cleaning.  Lactation outpatient services and support reviewed and encouraged.  Patient Name: Cynthia Townsend ChuteKeasha Nawabi EAVWU'JToday's Date: 09/03/2015 Reason for consult: Follow-up assessment;Difficult latch   Maternal Data    Feeding Feeding Type: Breast Fed Length of feed: 20 min  LATCH Score/Interventions Latch: Grasps breast easily, tongue down, lips flanged, rhythmical sucking. (with 24 mm nipple shield) Intervention(s): Skin to skin;Teach feeding cues;Waking techniques Intervention(s): Breast compression;Breast massage;Assist with latch;Adjust position  Audible Swallowing: Spontaneous and intermittent Intervention(s): Alternate breast massage  Type of Nipple: Everted at rest and after stimulation  Comfort (Breast/Nipple): Soft / non-tender     Hold (Positioning): No assistance needed to correctly position infant at breast. Intervention(s): Breastfeeding basics reviewed;Support Pillows  LATCH Score: 10  Lactation Tools Discussed/Used Tools: Nipple Shields Nipple shield size: 24   Consult Status Consult Status: Complete    Garth Diffley S 09/03/2015, 10:20 AM

## 2015-09-03 NOTE — Discharge Summary (Signed)
Obstetric Discharge Summary Reason for Admission: induction of labor Prenatal Procedures: none Intrapartum Procedures: spontaneous vaginal delivery Postpartum Procedures: none Complications-Operative and Postpartum: none HEMOGLOBIN  Date Value Ref Range Status  09/02/2015 8.5* 12.0 - 15.0 g/dL Final    Comment:    REPEATED TO VERIFY DELTA CHECK NOTED    HCT  Date Value Ref Range Status  09/02/2015 26.0* 36.0 - 46.0 % Final    Physical Exam:  General: alert, cooperative and no distress Lochia: appropriate Uterine Fundus: firm Incision: healing well DVT Evaluation: No evidence of DVT seen on physical exam.  Discharge Diagnoses: Term Pregnancy-delivered  Discharge Information: Date: 09/03/2015 Activity: pelvic rest Diet: routine Medications: PNV, Ibuprofen and Percocet, iron Condition: stable Instructions: refer to practice specific booklet Discharge to: home Follow-up Information    Follow up In 5 weeks.      Newborn Data: Live born female  Birth Weight: 7 lb 11.6 oz (3504 g) APGAR: 8, 9  Home with mother.  Aowyn Rozeboom II,Raelynn Corron E 09/03/2015, 9:22 AM

## 2015-09-04 ENCOUNTER — Inpatient Hospital Stay (HOSPITAL_COMMUNITY): Payer: 59

## 2015-09-04 ENCOUNTER — Inpatient Hospital Stay (HOSPITAL_COMMUNITY): Payer: 59 | Admitting: Certified Registered Nurse Anesthetist

## 2015-09-04 ENCOUNTER — Inpatient Hospital Stay (HOSPITAL_COMMUNITY)
Admission: AD | Admit: 2015-09-04 | Discharge: 2015-09-08 | DRG: 769 | Disposition: A | Discharge status: Home / Self Care | Payer: 59 | Source: Ambulatory Visit | Attending: Obstetrics and Gynecology | Admitting: Obstetrics and Gynecology

## 2015-09-04 ENCOUNTER — Encounter (HOSPITAL_COMMUNITY)
Admission: AD | Disposition: A | Discharge status: Home / Self Care | Payer: Self-pay | Source: Ambulatory Visit | Attending: Obstetrics & Gynecology

## 2015-09-04 ENCOUNTER — Encounter (HOSPITAL_COMMUNITY): Payer: Self-pay | Admitting: Anesthesiology

## 2015-09-04 DIAGNOSIS — O9081 Anemia of the puerperium: Secondary | ICD-10-CM | POA: Diagnosis present

## 2015-09-04 DIAGNOSIS — Z87891 Personal history of nicotine dependence: Secondary | ICD-10-CM | POA: Diagnosis not present

## 2015-09-04 DIAGNOSIS — J811 Chronic pulmonary edema: Secondary | ICD-10-CM

## 2015-09-04 DIAGNOSIS — O99345 Other mental disorders complicating the puerperium: Secondary | ICD-10-CM | POA: Diagnosis present

## 2015-09-04 DIAGNOSIS — R0602 Shortness of breath: Secondary | ICD-10-CM | POA: Diagnosis not present

## 2015-09-04 DIAGNOSIS — O9953 Diseases of the respiratory system complicating the puerperium: Secondary | ICD-10-CM | POA: Diagnosis present

## 2015-09-04 DIAGNOSIS — O1495 Unspecified pre-eclampsia, complicating the puerperium: Secondary | ICD-10-CM | POA: Diagnosis present

## 2015-09-04 DIAGNOSIS — R072 Precordial pain: Secondary | ICD-10-CM | POA: Diagnosis not present

## 2015-09-04 DIAGNOSIS — J810 Acute pulmonary edema: Secondary | ICD-10-CM | POA: Diagnosis present

## 2015-09-04 DIAGNOSIS — F419 Anxiety disorder, unspecified: Secondary | ICD-10-CM | POA: Diagnosis present

## 2015-09-04 DIAGNOSIS — N939 Abnormal uterine and vaginal bleeding, unspecified: Secondary | ICD-10-CM | POA: Diagnosis not present

## 2015-09-04 HISTORY — PX: DILATION AND EVACUATION: SHX1459

## 2015-09-04 LAB — PROTEIN / CREATININE RATIO, URINE
CREATININE, URINE: 133 mg/dL
PROTEIN CREATININE RATIO: 0.5 mg/mg{creat} — AB (ref 0.00–0.15)
Total Protein, Urine: 66 mg/dL

## 2015-09-04 LAB — CBC
HEMATOCRIT: 23.7 % — AB (ref 36.0–46.0)
HEMOGLOBIN: 7.9 g/dL — AB (ref 12.0–15.0)
MCH: 28.7 pg (ref 26.0–34.0)
MCHC: 33.3 g/dL (ref 30.0–36.0)
MCV: 86.2 fL (ref 78.0–100.0)
Platelets: 234 10*3/uL (ref 150–400)
RBC: 2.75 MIL/uL — AB (ref 3.87–5.11)
RDW: 14.8 % (ref 11.5–15.5)
WBC: 9.3 10*3/uL (ref 4.0–10.5)

## 2015-09-04 LAB — URINALYSIS, ROUTINE W REFLEX MICROSCOPIC
Bilirubin Urine: NEGATIVE
Glucose, UA: NEGATIVE mg/dL
Ketones, ur: NEGATIVE mg/dL
NITRITE: NEGATIVE
Protein, ur: 100 mg/dL — AB
SPECIFIC GRAVITY, URINE: 1.025 (ref 1.005–1.030)
pH: 6 (ref 5.0–8.0)

## 2015-09-04 LAB — URINE MICROSCOPIC-ADD ON: Bacteria, UA: NONE SEEN

## 2015-09-04 LAB — COMPREHENSIVE METABOLIC PANEL
ALT: 23 U/L (ref 14–54)
AST: 33 U/L (ref 15–41)
Albumin: 2.4 g/dL — ABNORMAL LOW (ref 3.5–5.0)
Alkaline Phosphatase: 120 U/L (ref 38–126)
Anion gap: 10 (ref 5–15)
BILIRUBIN TOTAL: 0.3 mg/dL (ref 0.3–1.2)
BUN: 10 mg/dL (ref 6–20)
CALCIUM: 8.3 mg/dL — AB (ref 8.9–10.3)
CO2: 22 mmol/L (ref 22–32)
Chloride: 109 mmol/L (ref 101–111)
Creatinine, Ser: 0.41 mg/dL — ABNORMAL LOW (ref 0.44–1.00)
GFR calc Af Amer: >60 mL/min (ref >60)
GLUCOSE: 89 mg/dL (ref 65–99)
Potassium: 3.6 mmol/L (ref 3.5–5.1)
Sodium: 141 mmol/L (ref 135–145)
TOTAL PROTEIN: 5.8 g/dL — AB (ref 6.5–8.1)

## 2015-09-04 LAB — TYPE AND SCREEN
ABO/RH(D): A NEG
ANTIBODY SCREEN: NEGATIVE

## 2015-09-04 LAB — URIC ACID: Uric Acid, Serum: 6.2 mg/dL (ref 2.3–6.6)

## 2015-09-04 LAB — LACTATE DEHYDROGENASE: LDH: 196 U/L — AB (ref 98–192)

## 2015-09-04 SURGERY — DILATION AND EVACUATION, UTERUS
Anesthesia: General | Site: Vagina

## 2015-09-04 MED ORDER — SUCCINYLCHOLINE CHLORIDE 20 MG/ML IJ SOLN
INTRAMUSCULAR | Status: DC | PRN
  Administered 2015-09-04: 120 mg via INTRAVENOUS

## 2015-09-04 MED ORDER — SODIUM CHLORIDE 0.9 % IV SOLN
510.0000 mg | Freq: Once | INTRAVENOUS | Status: AC
  Administered 2015-09-04: 510 mg via INTRAVENOUS
  Filled 2015-09-04: qty 17

## 2015-09-04 MED ORDER — FUROSEMIDE 10 MG/ML IJ SOLN
INTRAMUSCULAR | Status: AC
  Filled 2015-09-04: qty 2

## 2015-09-04 MED ORDER — LACTATED RINGERS IV SOLN
INTRAVENOUS | Status: DC
  Administered 2015-09-04 – 2015-09-05 (×3): via INTRAVENOUS

## 2015-09-04 MED ORDER — METRONIDAZOLE 500 MG PO TABS: 500.0000 mg | ORAL_TABLET | Freq: Two times a day (BID) | ORAL | Status: DC

## 2015-09-04 MED ORDER — PROPOFOL 10 MG/ML IV BOLUS
INTRAVENOUS | Status: AC
  Filled 2015-09-04: qty 20

## 2015-09-04 MED ORDER — FUROSEMIDE 10 MG/ML IJ SOLN
10.0000 mg | Freq: Once | INTRAMUSCULAR | Status: AC
  Administered 2015-09-04: 10 mg via INTRAVENOUS

## 2015-09-04 MED ORDER — FAMOTIDINE IN NACL 20-0.9 MG/50ML-% IV SOLN
20.0000 mg | Freq: Once | INTRAVENOUS | Status: AC
  Administered 2015-09-04: 20 mg via INTRAVENOUS
  Filled 2015-09-04: qty 50

## 2015-09-04 MED ORDER — OXYCODONE-ACETAMINOPHEN 5-325 MG PO TABS
1.0000 | ORAL_TABLET | ORAL | Status: DC | PRN
  Administered 2015-09-04: 2 via ORAL
  Administered 2015-09-05 (×4): 1 via ORAL
  Administered 2015-09-05 (×2): 2 via ORAL
  Administered 2015-09-06 – 2015-09-07 (×3): 1 via ORAL
  Filled 2015-09-04: qty 2
  Filled 2015-09-04: qty 1
  Filled 2015-09-04: qty 2
  Filled 2015-09-04 (×6): qty 1
  Filled 2015-09-04: qty 2

## 2015-09-04 MED ORDER — METOCLOPRAMIDE HCL 5 MG/ML IJ SOLN
INTRAMUSCULAR | Status: DC | PRN
  Administered 2015-09-04: 10 mg via INTRAVENOUS

## 2015-09-04 MED ORDER — METOCLOPRAMIDE HCL 5 MG/ML IJ SOLN
INTRAMUSCULAR | Status: AC
  Filled 2015-09-04: qty 2

## 2015-09-04 MED ORDER — MIDAZOLAM HCL 2 MG/2ML IJ SOLN
INTRAMUSCULAR | Status: DC | PRN
  Administered 2015-09-04: 2 mg via INTRAVENOUS

## 2015-09-04 MED ORDER — ONDANSETRON HCL 4 MG/2ML IJ SOLN
INTRAMUSCULAR | Status: DC | PRN
  Administered 2015-09-04: 4 mg via INTRAVENOUS

## 2015-09-04 MED ORDER — FENTANYL CITRATE (PF) 100 MCG/2ML IJ SOLN
25.0000 ug | INTRAMUSCULAR | Status: DC | PRN
  Administered 2015-09-04 (×2): 25 ug via INTRAVENOUS
  Administered 2015-09-04: 50 ug via INTRAVENOUS

## 2015-09-04 MED ORDER — FUROSEMIDE 10 MG/ML IJ SOLN
20.0000 mg | Freq: Once | INTRAMUSCULAR | Status: AC
  Administered 2015-09-04: 20 mg via INTRAVENOUS
  Filled 2015-09-04: qty 2

## 2015-09-04 MED ORDER — FENTANYL CITRATE (PF) 100 MCG/2ML IJ SOLN
INTRAMUSCULAR | Status: DC | PRN
  Administered 2015-09-04: 100 ug via INTRAVENOUS
  Administered 2015-09-04 (×3): 50 ug via INTRAVENOUS

## 2015-09-04 MED ORDER — CITRIC ACID-SODIUM CITRATE 334-500 MG/5ML PO SOLN
30.0000 mL | Freq: Once | ORAL | Status: AC
  Administered 2015-09-04: 30 mL via ORAL
  Filled 2015-09-04: qty 15

## 2015-09-04 MED ORDER — MIDAZOLAM HCL 2 MG/2ML IJ SOLN
INTRAMUSCULAR | Status: AC
  Filled 2015-09-04: qty 2

## 2015-09-04 MED ORDER — ONDANSETRON HCL 4 MG/2ML IJ SOLN: 4.0000 mg | Freq: Four times a day (QID) | INTRAMUSCULAR | Status: DC | PRN

## 2015-09-04 MED ORDER — MAGNESIUM SULFATE 50 % IJ SOLN
2.0000 g/h | INTRAVENOUS | Status: DC
  Filled 2015-09-04 (×2): qty 80

## 2015-09-04 MED ORDER — MAGNESIUM SULFATE BOLUS VIA INFUSION
4.0000 g | Freq: Once | INTRAVENOUS | Status: AC
  Administered 2015-09-04: 4 g via INTRAVENOUS
  Filled 2015-09-04: qty 500

## 2015-09-04 MED ORDER — LIDOCAINE HCL (CARDIAC) 20 MG/ML IV SOLN
INTRAVENOUS | Status: AC
  Filled 2015-09-04: qty 5

## 2015-09-04 MED ORDER — FUROSEMIDE 10 MG/ML IJ SOLN: 40.0000 mg | Freq: Once | INTRAMUSCULAR | Status: DC

## 2015-09-04 MED ORDER — LACTATED RINGERS IV SOLN: INTRAVENOUS | Status: DC

## 2015-09-04 MED ORDER — PRENATAL MULTIVITAMIN CH
1.0000 | ORAL_TABLET | Freq: Every day | ORAL | Status: DC
  Administered 2015-09-05 – 2015-09-07 (×3): 1 via ORAL
  Filled 2015-09-04 (×3): qty 1

## 2015-09-04 MED ORDER — IBUPROFEN 800 MG PO TABS
800.0000 mg | ORAL_TABLET | Freq: Three times a day (TID) | ORAL | Status: DC | PRN
  Administered 2015-09-04 – 2015-09-06 (×6): 800 mg via ORAL
  Filled 2015-09-04 (×5): qty 1

## 2015-09-04 MED ORDER — FENTANYL CITRATE (PF) 250 MCG/5ML IJ SOLN
INTRAMUSCULAR | Status: AC
  Filled 2015-09-04: qty 5

## 2015-09-04 MED ORDER — DEXTROSE 5 % IV SOLN
100.0000 mg | Freq: Once | INTRAVENOUS | Status: AC
  Administered 2015-09-04 (×2): 100 mg via INTRAVENOUS
  Filled 2015-09-04: qty 100

## 2015-09-04 MED ORDER — SIMETHICONE 80 MG PO CHEW
80.0000 mg | CHEWABLE_TABLET | Freq: Four times a day (QID) | ORAL | Status: DC | PRN
  Administered 2015-09-05: 80 mg via ORAL
  Filled 2015-09-04: qty 1

## 2015-09-04 MED ORDER — ONDANSETRON HCL 4 MG/2ML IJ SOLN
INTRAMUSCULAR | Status: AC
  Filled 2015-09-04: qty 2

## 2015-09-04 MED ORDER — IBUPROFEN 800 MG PO TABS
ORAL_TABLET | ORAL | Status: AC
  Filled 2015-09-04: qty 1

## 2015-09-04 MED ORDER — OXYCODONE-ACETAMINOPHEN 5-325 MG PO TABS
1.0000 | ORAL_TABLET | Freq: Once | ORAL | Status: AC
  Administered 2015-09-04: 1 via ORAL
  Filled 2015-09-04: qty 1

## 2015-09-04 MED ORDER — ONDANSETRON HCL 4 MG PO TABS: 4.0000 mg | ORAL_TABLET | Freq: Four times a day (QID) | ORAL | Status: DC | PRN

## 2015-09-04 MED ORDER — DEXAMETHASONE SODIUM PHOSPHATE 10 MG/ML IJ SOLN
INTRAMUSCULAR | Status: DC | PRN
  Administered 2015-09-04: 10 mg via INTRAVENOUS

## 2015-09-04 MED ORDER — LIDOCAINE HCL (CARDIAC) 20 MG/ML IV SOLN
INTRAVENOUS | Status: DC | PRN
  Administered 2015-09-04: 80 mg via INTRAVENOUS

## 2015-09-04 MED ORDER — OXYCODONE-ACETAMINOPHEN 5-325 MG PO TABS
ORAL_TABLET | ORAL | Status: AC
  Filled 2015-09-04: qty 2

## 2015-09-04 MED ORDER — METRONIDAZOLE 500 MG PO TABS
500.0000 mg | ORAL_TABLET | Freq: Two times a day (BID) | ORAL | Status: DC
  Administered 2015-09-04 – 2015-09-06 (×4): 500 mg via ORAL
  Filled 2015-09-04 (×6): qty 1

## 2015-09-04 MED ORDER — DEXAMETHASONE SODIUM PHOSPHATE 10 MG/ML IJ SOLN
INTRAMUSCULAR | Status: AC
  Filled 2015-09-04: qty 1

## 2015-09-04 MED ORDER — MENTHOL 3 MG MT LOZG
1.0000 | LOZENGE | OROMUCOSAL | Status: DC | PRN
  Administered 2015-09-06: 3 mg via ORAL
  Filled 2015-09-04: qty 9

## 2015-09-04 MED ORDER — DOCUSATE SODIUM 100 MG PO CAPS
100.0000 mg | ORAL_CAPSULE | Freq: Two times a day (BID) | ORAL | Status: DC
  Administered 2015-09-04 – 2015-09-08 (×8): 100 mg via ORAL
  Filled 2015-09-04 (×8): qty 1

## 2015-09-04 MED ORDER — PROMETHAZINE HCL 25 MG/ML IJ SOLN: 6.2500 mg | INTRAMUSCULAR | Status: DC | PRN

## 2015-09-04 MED ORDER — FENTANYL CITRATE (PF) 100 MCG/2ML IJ SOLN
INTRAMUSCULAR | Status: AC
  Filled 2015-09-04: qty 2

## 2015-09-04 MED ORDER — BENZOCAINE-MENTHOL 20-0.5 % EX AERO
1.0000 "application " | INHALATION_SPRAY | Freq: Four times a day (QID) | CUTANEOUS | Status: DC | PRN
  Filled 2015-09-04 (×2): qty 56

## 2015-09-04 MED ORDER — PROPOFOL 10 MG/ML IV BOLUS
INTRAVENOUS | Status: DC | PRN
  Administered 2015-09-04: 150 mg via INTRAVENOUS

## 2015-09-04 MED FILL — metroNIDAZOLE 500 MG TABS: 500 | 7 days supply | Qty: 14 | Fill #0

## 2015-09-04 MED FILL — IBUPROFEN 600 MG TABLET: 600 | 8 days supply | Qty: 30 | Fill #0

## 2015-09-04 SURGICAL SUPPLY — 20 items
CATH ROBINSON RED A/P 16FR (CATHETERS) ×2 IMPLANT
CLOTH BEACON ORANGE TIMEOUT ST (SAFETY) ×2 IMPLANT
DECANTER SPIKE VIAL GLASS SM (MISCELLANEOUS) ×2 IMPLANT
GLOVE BIO SURGEON STRL SZ 6 (GLOVE) ×2 IMPLANT
GLOVE BIOGEL PI IND STRL 6 (GLOVE) ×2 IMPLANT
GLOVE BIOGEL PI IND STRL 7.0 (GLOVE) ×1 IMPLANT
GLOVE BIOGEL PI INDICATOR 6 (GLOVE) ×2
GLOVE BIOGEL PI INDICATOR 7.0 (GLOVE) ×1
GOWN STRL REUS W/TWL LRG LVL3 (GOWN DISPOSABLE) ×4 IMPLANT
KIT BERKELEY 1ST TRIMESTER 3/8 (MISCELLANEOUS) ×2 IMPLANT
NS IRRIG 1000ML POUR BTL (IV SOLUTION) ×2 IMPLANT
PACK VAGINAL MINOR WOMEN LF (CUSTOM PROCEDURE TRAY) ×2 IMPLANT
PAD OB MATERNITY 4.3X12.25 (PERSONAL CARE ITEMS) ×2 IMPLANT
PAD PREP 24X48 CUFFED NSTRL (MISCELLANEOUS) ×2 IMPLANT
SET BERKELEY SUCTION TUBING (SUCTIONS) ×2 IMPLANT
TOWEL OR 17X24 6PK STRL BLUE (TOWEL DISPOSABLE) ×4 IMPLANT
VACURETTE 10 RIGID CVD (CANNULA) IMPLANT
VACURETTE 7MM CVD STRL WRAP (CANNULA) IMPLANT
VACURETTE 8 RIGID CVD (CANNULA) IMPLANT
VACURETTE 9 RIGID CVD (CANNULA) ×2 IMPLANT

## 2015-09-04 NOTE — MAU Note (Signed)
Patient sent from office for evaluation. Delivered vaginally on Friday 09/01/15. C/o swelling in legs such that she can not bend them, heavy bleeding and passing clots.

## 2015-09-04 NOTE — Transfer of Care (Signed)
Immediate Anesthesia Transfer of Care Note  Patient: Cynthia Townsend  Procedure(s) Performed: Procedure(s): DILATATION AND EVACUATION (N/A)  Patient Location: PACU  Anesthesia Type:General  Level of Consciousness: awake, alert , oriented and patient cooperative  Airway & Oxygen Therapy: Patient Spontanous Breathing and Patient connected to nasal cannula oxygen  Post-op Assessment: Report given to RN and Post -op Vital signs reviewed and stable  Post vital signs: Reviewed and stable  Last Vitals:  Filed Vitals:   09/04/15 1400 09/04/15 1632  BP: 126/78 146/67  Pulse: 92 83  Temp:    Resp:  18    Complications: No apparent anesthesia complications

## 2015-09-04 NOTE — Anesthesia Postprocedure Evaluation (Signed)
Anesthesia Post Note  Patient: Cynthia Townsend  Procedure(s) Performed: Procedure(s) (LRB): DILATATION AND EVACUATION (N/A)  Patient location during evaluation: PACU Anesthesia Type: General Level of consciousness: awake and alert and oriented Pain management: pain level controlled Vital Signs Assessment: post-procedure vital signs reviewed and stable Respiratory status: spontaneous breathing, nonlabored ventilation and respiratory function stable Cardiovascular status: stable Postop Assessment: no signs of nausea or vomiting Anesthetic complications: no Comments: Patient had low SpO2 immediately post op which responded to O2. She continued to improve during her PACU stay. CXR was obtained which showed increased bronchovascular markings and possibly early volume overload. She is being evaluated for pre eclampsia. She was given Lasix 10mg  IV in PACU. Her UOP was 450 ml in PACU. She has some pitting pedal edema. Her lungs are clear bilaterally. Her last SpO2 was 97% on RA. She is being admitted for overnight observation.    Last Vitals:  Filed Vitals:   09/04/15 1815 09/04/15 1900  BP: 140/81 147/83  Pulse: 78 96  Temp:    Resp: 19 17    Last Pain:  Filed Vitals:   09/04/15 2001  PainSc: 4                   Antolin A.

## 2015-09-04 NOTE — Progress Notes (Signed)
CXR showed pulmonary edema.  With hypertension, HA on admit and pulmonary edema, will admit for 24 hours of magnesium sulfate for postpartum pre-eclampsia.  Will give 20 mg Lasix AM labs pending   Mitchel HonourMegan Yurem Viner, DO

## 2015-09-04 NOTE — Anesthesia Procedure Notes (Signed)
Procedure Name: Intubation Date/Time: 09/04/2015 5:07 PM Performed by: Yolonda KidaARVER, Wilgus Deyton L Pre-anesthesia Checklist: Patient identified, Emergency Drugs available, Suction available and Patient being monitored Patient Re-evaluated:Patient Re-evaluated prior to inductionOxygen Delivery Method: Circle system utilized Preoxygenation: Pre-oxygenation with 100% oxygen Intubation Type: IV induction, Rapid sequence and Cricoid Pressure applied Laryngoscope Size: Mac and 3 Grade View: Grade I Tube type: Oral Tube size: 7.0 mm Number of attempts: 1 Airway Equipment and Method: Stylet Placement Confirmation: ETT inserted through vocal cords under direct vision,  breath sounds checked- equal and bilateral,  positive ETCO2 and CO2 detector Secured at: 20 cm Tube secured with: Tape Dental Injury: Teeth and Oropharynx as per pre-operative assessment

## 2015-09-04 NOTE — H&P (Addendum)
Cynthia ChuteKeasha Townsend is an 35 y.o. female PPD#3 s/p SVD (IOL for GHTN).  She was discharged home yesterday and started having heavy vaginal bleeding/clotting today.  In MAU, speculum exam revealed tissue at os and ultrasound confirmed likely retained POC.    Pertinent Gynecological History: Menses: n/a Bleeding: n/a Contraception: abstinence DES exposure: unknown Blood transfusions: none Sexually transmitted diseases: no past history Previous GYN Procedures: none  Last mammogram: n/a Date: n/a Last pap: n/a Date: n/a OB History: G2, P1011   Menstrual History: Menarche age: n/a  No LMP recorded.    Past Medical History  Diagnosis Date  . Anxiety   . ADD (attention deficit disorder)   . Sciatic pain   . Chicken pox   . Depression   . Subclinical hypothyroidism   . Insomnia   . Vitamin D deficiency   . Nasal polyps   . Ectopic pregnancy   . Pregnancy induced hypertension     Past Surgical History  Procedure Laterality Date  . Breast biopsy      benign    Family History  Problem Relation Age of Onset  . Alcohol abuse Mother   . Lung cancer Mother   . Hyperlipidemia Mother   . Heart disease Mother   . Mental illness Mother   . Hyperthyroidism Mother   . COPD Mother   . Hyperlipidemia Father   . Heart disease Father   . Transient ischemic attack Father   . Pulmonary embolism Father   . Heart disease Sister   . Mental illness Sister   . Heart disease Maternal Uncle   . Rheum arthritis Maternal Grandmother     Social History:  reports that she has quit smoking. Her smoking use included Cigarettes. She has never used smokeless tobacco. She reports that she does not drink alcohol or use illicit drugs.  Allergies:  Allergies  Allergen Reactions  . Penicillins Shortness Of Breath, Swelling and Other (See Comments)    Has patient had a PCN reaction causing immediate rash, facial/tongue/throat swelling, SOB or lightheadedness with hypotension:Unknown Has patient had a  PCN reaction causing severe rash involving mucus membranes or skin necrosis: unknown Has patient had a PCN reaction that required hospitalization unknown Has patient had a PCN reaction occurring within the last 10 years: uknown If all of the above answers are "NO", then may proceed with Cephalosporin use.     . Adhesive [Tape] Rash  . Sulfa Antibiotics Hives and Rash    Prescriptions prior to admission  Medication Sig Dispense Refill Last Dose  . acetaminophen (TYLENOL) 500 MG tablet Take 1,000 mg by mouth every 6 (six) hours as needed for mild pain.   Past Week at Unknown time  . butalbital-acetaminophen-caffeine (FIORICET) 50-325-40 MG tablet Take 1-2 tablets by mouth every 6 (six) hours as needed for headache. 15 tablet 0 Past Week at Unknown time  . ibuprofen (ADVIL,MOTRIN) 600 MG tablet Take 1 tablet (600 mg total) by mouth every 6 (six) hours as needed. (Patient taking differently: Take 600 mg by mouth every 6 (six) hours as needed for moderate pain or cramping. ) 30 tablet 0 09/04/2015 at 0600  . oxyCODONE-acetaminophen (PERCOCET/ROXICET) 5-325 MG tablet Take 1-2 tablets by mouth every 6 (six) hours as needed (pain scale 4-7). 30 tablet 0 09/04/2015 at 0600  . Prenatal Vit-Fe Fumarate-FA (PRENATAL MULTIVITAMIN) TABS tablet Take 1 tablet by mouth at bedtime.    09/03/2015 at Unknown time  . ferrous sulfate (FERROUSUL) 325 (65 FE) MG tablet Take 1  tablet (325 mg total) by mouth 2 (two) times daily with a meal. (Patient not taking: Reported on 09/04/2015) 60 tablet 3 Not Taking at Unknown time    ROS  Blood pressure 146/67, pulse 83, temperature 98.4 F (36.9 C), temperature source Oral, resp. rate 18, SpO2 99 %, unknown if currently breastfeeding. Physical Exam  Constitutional: She is oriented to person, place, and time. She appears well-developed and well-nourished.  GI: Soft. There is no rebound and no guarding.  Neurological: She is alert and oriented to person, place, and time.   Skin: Skin is warm and dry.  Psychiatric: She has a normal mood and affect. Her behavior is normal.    Results for orders placed or performed during the hospital encounter of 09/04/15 (from the past 24 hour(s))  CBC     Status: Abnormal   Collection Time: 09/04/15 10:50 AM  Result Value Ref Range   WBC 9.3 4.0 - 10.5 K/uL   RBC 2.75 (L) 3.87 - 5.11 MIL/uL   Hemoglobin 7.9 (L) 12.0 - 15.0 g/dL   HCT 16.1 (L) 09.6 - 04.5 %   MCV 86.2 78.0 - 100.0 fL   MCH 28.7 26.0 - 34.0 pg   MCHC 33.3 30.0 - 36.0 g/dL   RDW 40.9 81.1 - 91.4 %   Platelets 234 150 - 400 K/uL  Comprehensive metabolic panel     Status: Abnormal   Collection Time: 09/04/15 10:50 AM  Result Value Ref Range   Sodium 141 135 - 145 mmol/L   Potassium 3.6 3.5 - 5.1 mmol/L   Chloride 109 101 - 111 mmol/L   CO2 22 22 - 32 mmol/L   Glucose, Bld 89 65 - 99 mg/dL   BUN 10 6 - 20 mg/dL   Creatinine, Ser 7.82 (L) 0.44 - 1.00 mg/dL   Calcium 8.3 (L) 8.9 - 10.3 mg/dL   Total Protein 5.8 (L) 6.5 - 8.1 g/dL   Albumin 2.4 (L) 3.5 - 5.0 g/dL   AST 33 15 - 41 U/L   ALT 23 14 - 54 U/L   Alkaline Phosphatase 120 38 - 126 U/L   Total Bilirubin 0.3 0.3 - 1.2 mg/dL   GFR calc non Af Amer >60 >60 mL/min   GFR calc Af Amer >60 >60 mL/min   Anion gap 10 5 - 15  Lactate dehydrogenase     Status: Abnormal   Collection Time: 09/04/15 10:50 AM  Result Value Ref Range   LDH 196 (H) 98 - 192 U/L  Uric acid     Status: None   Collection Time: 09/04/15 10:50 AM  Result Value Ref Range   Uric Acid, Serum 6.2 2.3 - 6.6 mg/dL  Urinalysis, Routine w reflex microscopic (not at [facility])     Status: Abnormal   Collection Time: 09/04/15 10:54 AM  Result Value Ref Range   Color, Urine RED (A) YELLOW   APPearance CLOUDY (A) CLEAR   Specific Gravity, Urine 1.025 1.005 - 1.030   pH 6.0 5.0 - 8.0   Glucose, UA NEGATIVE NEGATIVE mg/dL   Hgb urine dipstick LARGE (A) NEGATIVE   Bilirubin Urine NEGATIVE NEGATIVE   Ketones, ur NEGATIVE NEGATIVE mg/dL    Protein, ur 956 (A) NEGATIVE mg/dL   Nitrite NEGATIVE NEGATIVE   Leukocytes, UA SMALL (A) NEGATIVE  Protein / creatinine ratio, urine     Status: Abnormal   Collection Time: 09/04/15 10:54 AM  Result Value Ref Range   Creatinine, Urine 133.00 mg/dL   Total Protein,  Urine 66 mg/dL   Protein Creatinine Ratio 0.50 (H) 0.00 - 0.15 mg/mg[Cre]  Urine microscopic-add on     Status: Abnormal   Collection Time: 09/04/15 10:54 AM  Result Value Ref Range   Squamous Epithelial / LPF 6-30 (A) NONE SEEN   WBC, UA 6-30 0 - 5 WBC/hpf   RBC / HPF TOO NUMEROUS TO COUNT 0 - 5 RBC/hpf   Bacteria, UA NONE SEEN NONE SEEN  Type and screen [facility]     Status: None   Collection Time: 09/04/15  2:20 PM  Result Value Ref Range   ABO/RH(D) A NEG    Antibody Screen NEG    Sample Expiration 09/07/2015     US Transvaginal Non-ob  09/04/2015  CLINICAL DATA:  Recent postpartum patient. Heavy bleeding with clots. EXAM: TRANSABDOMINAL AND TRANSVAGINAL ULTRASOUND OF PELVIS TECHNIQUE: Both transabdominal and transvaginal ultrasound examinations of the pelvis were performed. Transabdominal technique was performed for global imaging of the pelvis including uterus, ovaries, adnexal regions, and pelvic cul-de-sac. It was necessary to proceed with endovaginal exam following the transabdominal exam to visualize the endometrium. COMPARISON:  None FINDINGS: Uterus Measurements: 18.5 x 10.4 x 15.1 cm. No fibroids or other mass visualized. Endometrium The endometrium is thickened and heterogeneous. Within the endometrium at the level of the mid uterine body there is a focal area of heterogeneous material with internal color vascularity. Right ovary Measurements: 4.1 x 2.6 x 2.2 cm. Normal appearance/no adnexal mass. Left ovary Measurements: 3.4 x 1.8 x 2.9 cm. Normal appearance/no adnexal mass. Other findings No abnormal free fluid. IMPRESSION: There is suggestion of vascular heterogeneous material within the  endometrium concerning for the retained products in the appropriate clinical setting. Electronically Signed   By: Annia Belt M.D.   On: 09/04/2015 13:49   US Pelvis Complete  09/04/2015  CLINICAL DATA:  Recent postpartum patient. Heavy bleeding with clots. EXAM: TRANSABDOMINAL AND TRANSVAGINAL ULTRASOUND OF PELVIS TECHNIQUE: Both transabdominal and transvaginal ultrasound examinations of the pelvis were performed. Transabdominal technique was performed for global imaging of the pelvis including uterus, ovaries, adnexal regions, and pelvic cul-de-sac. It was necessary to proceed with endovaginal exam following the transabdominal exam to visualize the endometrium. COMPARISON:  None FINDINGS: Uterus Measurements: 18.5 x 10.4 x 15.1 cm. No fibroids or other mass visualized. Endometrium The endometrium is thickened and heterogeneous. Within the endometrium at the level of the mid uterine body there is a focal area of heterogeneous material with internal color vascularity. Right ovary Measurements: 4.1 x 2.6 x 2.2 cm. Normal appearance/no adnexal mass. Left ovary Measurements: 3.4 x 1.8 x 2.9 cm. Normal appearance/no adnexal mass. Other findings No abnormal free fluid. IMPRESSION: There is suggestion of vascular heterogeneous material within the endometrium concerning for the retained products in the appropriate clinical setting. Electronically Signed   By: Annia Belt M.D.   On: 09/04/2015 13:49    Assessment/Plan: 34yo PPD#3 with retained POC -Suction D&C-Patient is counseled re: risk of bleeding, infection, scarring and damage to surrounding structures.  All questions were answered and the patient wishes to proceed. -IV iron infusion given in MAU  Deontre Allsup 09/04/2015, 4:44 PM

## 2015-09-04 NOTE — Discharge Instructions (Signed)
Call MD for T>100.4, heavy vaginal bleeding, severe abdominal pain, intractable nausea and/or vomiting, or respiratory distress.  Call office to schedule 1 week postop appt.  No driving while taking narcotics.  Pelvic rest x 6 weeks.

## 2015-09-04 NOTE — Op Note (Addendum)
PREOPERATIVE DIAGNOSIS: 35 y.o. with suspected retained products of conception, heavy vaginal bleeding  POSTOPERATIVE DIAGNOSIS: The same  PROCEDURE: Suction Dilation and Curettage  SURGEON: Dr. Mitchel HonourMegan Sayge Salvato  INDICATIONS: 35 y.o. [id number] PPD#3 here for scheduled surgery for treatment of suspected retained POC.  Risks of surgery were discussed with the patient including but not limited to: bleeding which may require transfusion; infection which may require antibiotics; injury to uterus or surrounding organs; intrauterine scarring which may impair future fertility; need for additional procedures including laparotomy or laparoscopy; and other postoperative/anesthesia complications. Written informed consent was obtained.   FINDINGS: A 14 week size uterus.   ANESTHESIA: GETA  ESTIMATED BLOOD LOSS: 25cc  SPECIMENS: POC sent to pathology  COMPLICATIONS: None immediate.  PROCEDURE DETAILS: The patient received intravenous antibiotics while in the preoperative area. She was then taken to the operating room where conscious sedation was administered and was found to be adequate. After an adequate timeout was performed, she was placed in the dorsal lithotomy position and examined; then prepped and draped in the sterile manner. Her bladder was catheterized for an unmeasured amount of clear, yellow urine. A speculum was then placed in the patient's vagina and a ringed forceps was applied to the anterior lip of the cervix.The 10mm suction curette was advanced without difficulty. A total of two passes were taken with tissue returned. A sharp curette was used to feel for gritty texture circumferentially which was present. A final pass with the suction curette was performed to remove blood. The forceps was removed from the anterior lip of the cervix and the vaginal speculum was removed after noting good hemostasis. The patient tolerated the procedure well and was taken to the recovery area  awake, extubated and in stable condition.

## 2015-09-04 NOTE — Progress Notes (Signed)
RN called to let me know patient has been short of breath.  O2 sat on RA has run 94%.  With supplemental O2 sat 99% however patient has severe anxiety and felt "closed in" while wearing. She reports being on the verge of a panic attack.  She is currently breast feeding. Will obs overnight and get CXR to r/o pulmonary cause.  Patient was induced for GHTN and BPs are mild range.   Mitchel HonourMegan Nikita Humble, DO

## 2015-09-04 NOTE — Progress Notes (Signed)
Attempted UA cath, unable to obtain sample.  Patient considerably painful and guarded.  Patient also voided just prior to attempt.  Will notify provider and attempt later.

## 2015-09-04 NOTE — MAU Provider Note (Signed)
History     CSN: [id number]  Arrival date and time: 09/04/15 1010    First Provider Initiated Contact with Patient 09/04/15 1121         Chief Complaint  Patient presents with  . Headache  . Vaginal Bleeding  . Leg Swelling   HPI  Cynthia Townsend is a 35 y.o. G2P1011 who presents for headache & vaginal bleeding. Patient is 3 days s/p SVD after being induced for gestational hypertension. Discharged yesterday, no HTN meds, send home with rx ferrous sulfate for anemia.  Reports increase in vaginal bleeding & passing dark red golf ball sized clot this morning. Reports mild abdominal cramping. Denies fever.  Also reports frontal headaches since being discharged home. Sensitive to light & sound. Taking percocet & ibuprofen with some relief. Currently rates headache 4/10. Denies vision changes, chest pain, epigastric pain, or SOB. Reports swelling in BLE that has worsened since yesterday and causes discomfort when she tries to bend her ankles.   OB History    Gravida Para Term Preterm AB TAB SAB Ectopic Multiple Living   2 1 1  0 1 0 0 1 0 1      Past Medical History  Diagnosis Date  . Anxiety   . ADD (attention deficit disorder)   . Sciatic pain   . Chicken pox   . Depression   . Subclinical hypothyroidism   . Insomnia   . Vitamin D deficiency   . Nasal polyps   . Ectopic pregnancy   . Pregnancy induced hypertension     Past Surgical History  Procedure Laterality Date  . Breast biopsy      benign    Family History  Problem Relation Age of Onset  . Alcohol abuse Mother   . Lung cancer Mother   . Hyperlipidemia Mother   . Heart disease Mother   . Mental illness Mother   . Hyperthyroidism Mother   . COPD Mother   . Hyperlipidemia Father   . Heart disease Father   . Transient ischemic attack Father   . Pulmonary embolism Father   . Heart disease Sister   . Mental illness Sister   . Heart disease Maternal Uncle   . Rheum arthritis Maternal Grandmother     Social  History  Substance Use Topics  . Smoking status: Former Smoker    Types: Cigarettes  . Smokeless tobacco: Never Used  . Alcohol Use: No    Allergies:  Allergies  Allergen Reactions  . Penicillins Shortness Of Breath, Swelling and Other (See Comments)    Has patient had a PCN reaction causing immediate rash, facial/tongue/throat swelling, SOB or lightheadedness with hypotension:Unknown Has patient had a PCN reaction causing severe rash involving mucus membranes or skin necrosis: unknown Has patient had a PCN reaction that required hospitalization unknown Has patient had a PCN reaction occurring within the last 10 years: uknown If all of the above answers are "NO", then may proceed with Cephalosporin use.     . Adhesive [Tape] Rash  . Sulfa Antibiotics Hives and Rash    Prescriptions prior to admission  Medication Sig Dispense Refill Last Dose  . acetaminophen (TYLENOL) 500 MG tablet Take 1,000 mg by mouth every 6 (six) hours as needed for mild pain.   Past Week at Unknown time  . butalbital-acetaminophen-caffeine (FIORICET) 50-325-40 MG tablet Take 1-2 tablets by mouth every 6 (six) hours as needed for headache. 15 tablet 0 Past Week at Unknown time  . ibuprofen (ADVIL,MOTRIN) 600 MG tablet  Take 1 tablet (600 mg total) by mouth every 6 (six) hours as needed. (Patient taking differently: Take 600 mg by mouth every 6 (six) hours as needed for moderate pain or cramping. ) 30 tablet 0 09/04/2015 at Unknown time  . oxyCODONE-acetaminophen (PERCOCET/ROXICET) 5-325 MG tablet Take 1-2 tablets by mouth every 6 (six) hours as needed (pain scale 4-7). 30 tablet 0 09/04/2015 at Unknown time  . Prenatal Vit-Fe Fumarate-FA (PRENATAL MULTIVITAMIN) TABS tablet Take 1 tablet by mouth at bedtime.    09/03/2015 at Unknown time  . ferrous sulfate (FERROUSUL) 325 (65 FE) MG tablet Take 1 tablet (325 mg total) by mouth 2 (two) times daily with a meal. (Patient not taking: Reported on 09/04/2015) 60 tablet 3 Not  Taking at Unknown time    Review of Systems  Constitutional: Negative for fever and chills.  Eyes: Positive for photophobia. Negative for blurred vision.  Respiratory: Negative.   Cardiovascular: Positive for palpitations. Negative for chest pain.  Gastrointestinal: Positive for abdominal pain (mild abdominal cramping). Negative for nausea and vomiting.  Genitourinary: Negative for dysuria.       + vaginal bleeding with clots  Neurological: Positive for dizziness (occasional episodes of dizziness since delivery) and headaches. Negative for loss of consciousness.   Physical Exam   Blood pressure 146/80, pulse 89, temperature 98.4 F (36.9 C), temperature source Oral, resp. rate 18, SpO2 99 %, unknown if currently breastfeeding.  Patient Vitals for the past 24 hrs:  BP Temp Temp src Pulse Resp SpO2  09/04/15 1339 122/84 mmHg - - 90 - -  09/04/15 1221 149/86 mmHg - - - - -  09/04/15 1124 155/95 mmHg - - - - -  09/04/15 1032 146/80 mmHg 98.4 F (36.9 C) Oral 89 18 99 %     Physical Exam  Nursing note and vitals reviewed. Constitutional: She is oriented to person, place, and time. She appears well-developed and well-nourished. No distress.  HENT:  Head: Normocephalic and atraumatic.  Eyes: Conjunctivae are normal. Right eye exhibits no discharge. Left eye exhibits no discharge. No scleral icterus.  Neck: Normal range of motion.  Cardiovascular: Normal rate, regular rhythm and normal heart sounds.   No murmur heard. Respiratory: Effort normal and breath sounds normal. No respiratory distress. She has no wheezes.  GI: Soft. Bowel sounds are normal. There is no tenderness.  Genitourinary: Uterus is enlarged. Uterus is not tender.  Several large fragments pulled from vagina that are tissue-like in appearance. Size varies approximately 3-6 cm. Tissue visualized at cervical os, but unable to remove. Small amount of dark red blood pooling in vagina.   Musculoskeletal: She exhibits edema  (BLE).  Neurological: She is alert and oriented to person, place, and time. She has normal reflexes.  No clonus  Skin: Skin is warm and dry. She is not diaphoretic.  Psychiatric: She has a normal mood and affect. Her behavior is normal. Judgment and thought content normal.    MAU Course  Procedures Results for orders placed or performed during the hospital encounter of 09/04/15 (from the past 24 hour(s))  CBC     Status: Abnormal   Collection Time: 09/04/15 10:50 AM  Result Value Ref Range   WBC 9.3 4.0 - 10.5 K/uL   RBC 2.75 (L) 3.87 - 5.11 MIL/uL   Hemoglobin 7.9 (L) 12.0 - 15.0 g/dL   HCT 78.2 (L) 95.6 - 21.3 %   MCV 86.2 78.0 - 100.0 fL   MCH 28.7 26.0 - 34.0 pg  MCHC 33.3 30.0 - 36.0 g/dL   RDW 81.1 91.4 - 78.2 %   Platelets 234 150 - 400 K/uL  Comprehensive metabolic panel     Status: Abnormal   Collection Time: 09/04/15 10:50 AM  Result Value Ref Range   Sodium 141 135 - 145 mmol/L   Potassium 3.6 3.5 - 5.1 mmol/L   Chloride 109 101 - 111 mmol/L   CO2 22 22 - 32 mmol/L   Glucose, Bld 89 65 - 99 mg/dL   BUN 10 6 - 20 mg/dL   Creatinine, Ser 9.56 (L) 0.44 - 1.00 mg/dL   Calcium 8.3 (L) 8.9 - 10.3 mg/dL   Total Protein 5.8 (L) 6.5 - 8.1 g/dL   Albumin 2.4 (L) 3.5 - 5.0 g/dL   AST 33 15 - 41 U/L   ALT 23 14 - 54 U/L   Alkaline Phosphatase 120 38 - 126 U/L   Total Bilirubin 0.3 0.3 - 1.2 mg/dL   GFR calc non Af Amer >60 >60 mL/min   GFR calc Af Amer >60 >60 mL/min   Anion gap 10 5 - 15  Lactate dehydrogenase     Status: Abnormal   Collection Time: 09/04/15 10:50 AM  Result Value Ref Range   LDH 196 (H) 98 - 192 U/L  Uric acid     Status: None   Collection Time: 09/04/15 10:50 AM  Result Value Ref Range   Uric Acid, Serum 6.2 2.3 - 6.6 mg/dL  Urinalysis, Routine w reflex microscopic (not at [facility])     Status: Abnormal   Collection Time: 09/04/15 10:54 AM  Result Value Ref Range   Color, Urine RED (A) YELLOW   APPearance CLOUDY (A) CLEAR   Specific Gravity,  Urine 1.025 1.005 - 1.030   pH 6.0 5.0 - 8.0   Glucose, UA NEGATIVE NEGATIVE mg/dL   Hgb urine dipstick LARGE (A) NEGATIVE   Bilirubin Urine NEGATIVE NEGATIVE   Ketones, ur NEGATIVE NEGATIVE mg/dL   Protein, ur 213 (A) NEGATIVE mg/dL   Nitrite NEGATIVE NEGATIVE   Leukocytes, UA SMALL (A) NEGATIVE  Protein / creatinine ratio, urine     Status: Abnormal   Collection Time: 09/04/15 10:54 AM  Result Value Ref Range   Creatinine, Urine 133.00 mg/dL   Total Protein, Urine 66 mg/dL   Protein Creatinine Ratio 0.50 (H) 0.00 - 0.15 mg/mg[Cre]  Urine microscopic-add on     Status: Abnormal   Collection Time: 09/04/15 10:54 AM  Result Value Ref Range   Squamous Epithelial / LPF 6-30 (A) NONE SEEN   WBC, UA 6-30 0 - 5 WBC/hpf   RBC / HPF TOO NUMEROUS TO COUNT 0 - 5 RBC/hpf   Bacteria, UA NONE SEEN NONE SEEN   US Transvaginal Non-ob  09/04/2015  CLINICAL DATA:  Recent postpartum patient. Heavy bleeding with clots. EXAM: TRANSABDOMINAL AND TRANSVAGINAL ULTRASOUND OF PELVIS TECHNIQUE: Both transabdominal and transvaginal ultrasound examinations of the pelvis were performed. Transabdominal technique was performed for global imaging of the pelvis including uterus, ovaries, adnexal regions, and pelvic cul-de-sac. It was necessary to proceed with endovaginal exam following the transabdominal exam to visualize the endometrium. COMPARISON:  None FINDINGS: Uterus Measurements: 18.5 x 10.4 x 15.1 cm. No fibroids or other mass visualized. Endometrium The endometrium is thickened and heterogeneous. Within the endometrium at the level of the mid uterine body there is a focal area of heterogeneous material with internal color vascularity. Right ovary Measurements: 4.1 x 2.6 x 2.2 cm. Normal appearance/no adnexal mass.  Left ovary Measurements: 3.4 x 1.8 x 2.9 cm. Normal appearance/no adnexal mass. Other findings No abnormal free fluid. IMPRESSION: There is suggestion of vascular heterogeneous material within the  endometrium concerning for the retained products in the appropriate clinical setting. Electronically Signed   By: Annia Beltrew  Davis M.D.   On: 09/04/2015 13:49   [place] Pelvis Complete  09/04/2015  CLINICAL DATA:  Recent postpartum patient. Heavy bleeding with clots. EXAM: TRANSABDOMINAL AND TRANSVAGINAL ULTRASOUND OF PELVIS TECHNIQUE: Both transabdominal and transvaginal ultrasound examinations of the pelvis were performed. Transabdominal technique was performed for global imaging of the pelvis including uterus, ovaries, adnexal regions, and pelvic cul-de-sac. It was necessary to proceed with endovaginal exam following the transabdominal exam to visualize the endometrium. COMPARISON:  None FINDINGS: Uterus Measurements: 18.5 x 10.4 x 15.1 cm. No fibroids or other mass visualized. Endometrium The endometrium is thickened and heterogeneous. Within the endometrium at the level of the mid uterine body there is a focal area of heterogeneous material with internal color vascularity. Right ovary Measurements: 4.1 x 2.6 x 2.2 cm. Normal appearance/no adnexal mass. Left ovary Measurements: 3.4 x 1.8 x 2.9 cm. Normal appearance/no adnexal mass. Other findings No abnormal free fluid. IMPRESSION: There is suggestion of vascular heterogeneous material within the endometrium concerning for the retained products in the appropriate clinical setting. Electronically Signed   By: Annia Beltrew  Davis M.D.   On: 09/04/2015 13:49    MDM Percocet 1 tab PO for headache - pt's last dose was 6 am.  PIH labs - elevated BPs, none severe range Patient sent for ultrasound IV started, Feraheme ordered S/w Dr. Langston MaskerMorris regarding patient presentation, exam, labs, & ultrasound. [facility]   Assessment and Plan  A:  1. Postpartum anemia   2. Postpartum bleeding   3. Retained portions placenta/membranes postpartum complication    P: Prepare for OR  Cynthia Townsend 09/04/2015, 11:20 AM

## 2015-09-04 NOTE — Anesthesia Preprocedure Evaluation (Addendum)
Anesthesia Evaluation  Patient identified by MRN, date of birth, ID band Patient awake  General Assessment Comment:Past Medical History Diagnosis Date . Anxiety  . ADD (attention deficit disorder)  . Sciatic pain  . Chicken pox  . Depression  . Subclinical hypothyroidism  . Insomnia  . Vitamin D deficiency  . Nasal polyps  . Ectopic pregnancy  . Pregnancy induced hypertension        Reviewed: Allergy & Precautions, NPO status , Patient's Chart, lab work & pertinent test results  Airway Mallampati: II  TM Distance: >3 FB Neck ROM: Full    Dental no notable dental hx.    Pulmonary former smoker,    Pulmonary exam normal breath sounds clear to auscultation       Cardiovascular hypertension, Normal cardiovascular exam Rhythm:Regular Rate:Normal     Neuro/Psych PSYCHIATRIC DISORDERS Anxiety Depression  Neuromuscular disease    GI/Hepatic negative GI ROS, Neg liver ROS,   Endo/Other  Hypothyroidism   Renal/GU negative Renal ROS  negative genitourinary   Musculoskeletal negative musculoskeletal ROS (+)   Abdominal   Peds negative pediatric ROS (+)  Hematology negative hematology ROS (+)   Anesthesia Other Findings   Reproductive/Obstetrics negative OB ROS                            Anesthesia Physical Anesthesia Plan  ASA: III  Anesthesia Plan: General   Post-op Pain Management:    Induction: Intravenous  Airway Management Planned: Oral ETT  Additional Equipment:   Intra-op Plan:   Post-operative Plan:   Informed Consent: I have reviewed the patients History and Physical, chart, labs and discussed the procedure including the risks, benefits and alternatives for the proposed anesthesia with the patient or authorized representative who has indicated his/her understanding and acceptance.   Dental advisory given  Plan Discussed with:  CRNA  Anesthesia Plan Comments: (Platelets 234K  Back and neck sore.  Plan general. Airway looks OK)      Anesthesia Quick Evaluation

## 2015-09-05 ENCOUNTER — Encounter (HOSPITAL_COMMUNITY): Payer: Self-pay | Admitting: Obstetrics & Gynecology

## 2015-09-05 LAB — COMPREHENSIVE METABOLIC PANEL
ALT: 27 U/L (ref 14–54)
ANION GAP: 11 (ref 5–15)
AST: 33 U/L (ref 15–41)
Albumin: 2.7 g/dL — ABNORMAL LOW (ref 3.5–5.0)
Alkaline Phosphatase: 131 U/L — ABNORMAL HIGH (ref 38–126)
BILIRUBIN TOTAL: 0.4 mg/dL (ref 0.3–1.2)
BUN: 9 mg/dL (ref 6–20)
CO2: 26 mmol/L (ref 22–32)
Calcium: 8 mg/dL — ABNORMAL LOW (ref 8.9–10.3)
Chloride: 104 mmol/L (ref 101–111)
Creatinine, Ser: 0.42 mg/dL — ABNORMAL LOW (ref 0.44–1.00)
Glucose, Bld: 94 mg/dL (ref 65–99)
POTASSIUM: 4.2 mmol/L (ref 3.5–5.1)
Sodium: 141 mmol/L (ref 135–145)
TOTAL PROTEIN: 6.2 g/dL — AB (ref 6.5–8.1)

## 2015-09-05 LAB — CBC
HEMATOCRIT: 26.2 % — AB (ref 36.0–46.0)
Hemoglobin: 8.5 g/dL — ABNORMAL LOW (ref 12.0–15.0)
MCH: 28.3 pg (ref 26.0–34.0)
MCHC: 32.4 g/dL (ref 30.0–36.0)
MCV: 87.3 fL (ref 78.0–100.0)
Platelets: 326 10*3/uL (ref 150–400)
RBC: 3 MIL/uL — ABNORMAL LOW (ref 3.87–5.11)
RDW: 14.4 % (ref 11.5–15.5)
WBC: 14.5 10*3/uL — ABNORMAL HIGH (ref 4.0–10.5)

## 2015-09-05 MED ORDER — FUROSEMIDE 10 MG/ML IJ SOLN
20.0000 mg | Freq: Once | INTRAMUSCULAR | Status: AC
  Administered 2015-09-05: 20 mg via INTRAVENOUS
  Filled 2015-09-05: qty 2

## 2015-09-05 NOTE — Progress Notes (Signed)
Pt C/O sob RR 26  02@ 2 [place], O2Sat 92-93. Bi-lat lungs diminished. Generalized edema noted, face and neck 1+, bi-lat lower extremities 3+. MD informed. Order received to give Lasix 20 mg IV x 1, D/C Mag, and kvo LR. Orders carried out. We will continue to monitor.

## 2015-09-05 NOTE — Anesthesia Postprocedure Evaluation (Signed)
Anesthesia Post Note  Patient: Shana ChuteKeasha Derwin  Procedure(s) Performed: Procedure(s) (LRB): DILATATION AND EVACUATION (N/A)  Patient location during evaluation: [facility] Anesthesia Type: General Level of consciousness: awake, awake and alert and oriented Pain management: satisfactory to patient Vital Signs Assessment: post-procedure vital signs reviewed and stable Respiratory status: spontaneous breathing, nonlabored ventilation and patient connected to nasal cannula oxygen Cardiovascular status: stable Postop Assessment: adequate PO intake and no signs of nausea or vomiting Anesthetic complications: no    Last Vitals:  Filed Vitals:   09/05/15 0600 09/05/15 0646  BP: 137/79   Pulse: 95   Temp:    Resp: 20 20    Last Pain:  Filed Vitals:   09/05/15 0647  PainSc: 5                  Leighanne Adolph

## 2015-09-05 NOTE — Progress Notes (Signed)
S: feeling better this am, SOB improving, continues on nasal O2 O: VSS , afebrile BP 137/79  FF , scant lochia  Labs: Hgb 8.5 WBC 14.5 Platelet- 326 K AST-33 ALT-27 Patient diuresing well  small pedal edema , DTR's 2+  A: pp pulmonary edema and retained POC's , s/p pp d and c    P: continue Mag for 24 hrs, maintain nasal O2 Anticipate dc in am

## 2015-09-05 NOTE — Progress Notes (Signed)
UR chart review completed.  

## 2015-09-06 LAB — COMPREHENSIVE METABOLIC PANEL
ALBUMIN: 2.6 g/dL — AB (ref 3.5–5.0)
ALT: 26 U/L (ref 14–54)
ANION GAP: 9 (ref 5–15)
AST: 28 U/L (ref 15–41)
Alkaline Phosphatase: 119 U/L (ref 38–126)
BILIRUBIN TOTAL: 0.4 mg/dL (ref 0.3–1.2)
BUN: 12 mg/dL (ref 6–20)
CHLORIDE: 106 mmol/L (ref 101–111)
CO2: 25 mmol/L (ref 22–32)
Calcium: 7.6 mg/dL — ABNORMAL LOW (ref 8.9–10.3)
Creatinine, Ser: 0.47 mg/dL (ref 0.44–1.00)
GFR calc Af Amer: >60 mL/min (ref >60)
GFR calc non Af Amer: >60 mL/min (ref >60)
GLUCOSE: 82 mg/dL (ref 65–99)
POTASSIUM: 3.6 mmol/L (ref 3.5–5.1)
SODIUM: 140 mmol/L (ref 135–145)
TOTAL PROTEIN: 5.6 g/dL — AB (ref 6.5–8.1)

## 2015-09-06 LAB — CBC
HEMATOCRIT: 25 % — AB (ref 36.0–46.0)
HEMOGLOBIN: 8 g/dL — AB (ref 12.0–15.0)
MCH: 28.1 pg (ref 26.0–34.0)
MCHC: 32 g/dL (ref 30.0–36.0)
MCV: 87.7 fL (ref 78.0–100.0)
Platelets: 270 10*3/uL (ref 150–400)
RBC: 2.85 MIL/uL — ABNORMAL LOW (ref 3.87–5.11)
RDW: 14.9 % (ref 11.5–15.5)
WBC: 9.6 10*3/uL (ref 4.0–10.5)

## 2015-09-06 LAB — INFLUENZA PANEL BY PCR (TYPE A & B)
H1N1 flu by pcr: NOT DETECTED
Influenza A By PCR: NEGATIVE
Influenza B By PCR: NEGATIVE

## 2015-09-06 MED ORDER — FUROSEMIDE 10 MG/ML IJ SOLN
40.0000 mg | Freq: Once | INTRAMUSCULAR | Status: AC
  Administered 2015-09-06: 40 mg via INTRAVENOUS
  Filled 2015-09-06: qty 4

## 2015-09-06 MED ORDER — ALPRAZOLAM 0.5 MG PO TABS
0.5000 mg | ORAL_TABLET | Freq: Three times a day (TID) | ORAL | Status: DC | PRN
  Administered 2015-09-06 (×2): 0.5 mg via ORAL
  Filled 2015-09-06 (×2): qty 1

## 2015-09-06 MED ORDER — BUTALBITAL-APAP-CAFFEINE 50-325-40 MG PO TABS
1.0000 | ORAL_TABLET | ORAL | Status: DC | PRN
  Administered 2015-09-06 – 2015-09-08 (×7): 1 via ORAL
  Filled 2015-09-06 (×7): qty 1

## 2015-09-06 NOTE — Progress Notes (Signed)
S: Patient complains Ha, throbbing r sided , not positional , light sensitive.  Also complains of sore throat and myalgia. Complains of persistent edema, No complaints of SOB O: VSS BP 132/92 PO2 99  Abd soft + BM  FF non- tender, small lochia A: R/o strep throat and flu  P: Strep culture and rapid flu screen ordered.  Fioricet ordered for HA.  Ferriheme previously administered

## 2015-09-06 NOTE — Progress Notes (Signed)
Patient very tearful and having anxiety and panic attacks throughout the night. MD on call paged for anti-anxiety medication. Xanax 0.5mg  given as ordered. Patient reassured and made comfortable in bed. Will keep monitoring.

## 2015-09-06 NOTE — Lactation Note (Signed)
Lactation Consultation Note  Patient Name: Shana ChuteKeasha Kustra ZOXWR'UToday's Date: 09/06/2015  Mom readmitted to hospital for placental fragments. Mom is 5 days post-delivery and [facility] due to breast engorgement. Mom reports that she was finally able to sleep and did not wake to pump. Enc mom to use ice, which was brought to the bedside. Enc mom to massage while pumping and massage and hand express after pumping as needed. Mom has a knotty area on upper-inner aspect of right breast. Demonstrated to mom how to massage while pumping. Enc massage and hand expression after using DEBP. Mom's milk is flowing well. Enc mom to use ice 15 minutes at a time in between pumping, and to pump at least every 2-3 hours.   Discussed how lack of pumping and milk stasis can impact breast milk supply.   Mom reports nipple soreness and some scabbing. [facility] ([facility] when baby brought to the hospital. Enc mom to use EBM on nipples today and tonight.    Maternal Data    Feeding    [facility] Score/Interventions                      Lactation Tools Discussed/Used     Consult Status      Geralynn OchsWILLIARD, Delpha Perko 09/06/2015, 3:32 PM

## 2015-09-06 NOTE — Progress Notes (Signed)
Patient is doing better!. The Fioricet helped her headache.  She is walking around the room now.  BP 155/90 mmHg  Pulse 93  Temp(Src) 99.1 F (37.3 C) (Oral)  Resp 16  Ht 5\' 3"  (1.6 m)  Wt 96.616 kg (213 lb)  BMI 37.74 kg/m2  SpO2 97%  Abdomen is soft and non tender  Extremities  Lower extremities are less swollen than in the am  Continue Fioricet prn. Probable discharge home in the am. Strep and flu swabs are pending

## 2015-09-07 ENCOUNTER — Inpatient Hospital Stay (HOSPITAL_COMMUNITY): Payer: 59

## 2015-09-07 DIAGNOSIS — O1495 Unspecified pre-eclampsia, complicating the puerperium: Secondary | ICD-10-CM | POA: Diagnosis present

## 2015-09-07 DIAGNOSIS — R0602 Shortness of breath: Secondary | ICD-10-CM

## 2015-09-07 LAB — ECHOCARDIOGRAM COMPLETE
Height: 63 in
Weight: 3324 oz

## 2015-09-07 MED ORDER — MISOPROSTOL 200 MCG PO TABS: 200.0000 ug | ORAL_TABLET | Freq: Once | ORAL | Status: DC | PRN

## 2015-09-07 MED ORDER — ALPRAZOLAM 0.25 MG PO TABS
0.2500 mg | ORAL_TABLET | Freq: Three times a day (TID) | ORAL | Status: DC | PRN
  Administered 2015-09-07 (×2): 0.25 mg via ORAL
  Filled 2015-09-07 (×3): qty 1

## 2015-09-07 NOTE — Progress Notes (Signed)
S: Feels better. HA resolved. Continues to have substernal sharp pain with inspiration. Continues with c/o of dysphagia, with some small blood noted in sputum. Body myalgias improved O: VSS, afebrile BP 142/75 Abd soft ,FF non- tender , scant lochia Breast softer , no erythema  Lungs CTA, no rales , rhonchi or wheezes auscultated Pathology from D and C pending Flu screen negative, group A throat swab pending Small pedal edema noted, L>R. Negative cords, DTR's 1+ A: H/o pulmonary edema , pp P: chest x-ray and ECHO

## 2015-09-07 NOTE — Progress Notes (Signed)
Cynthia Townsend was very receptive and open to talking.  She reported that she is doing better today, especially with her baby present with her.  She talked openly about her history with anxiety and depression and also spoke specifically about what she described as an "anxiety attack" on the first evening of her re-admission.  She reported that she has not had anything like that since that evening and that lack of sleep, difficulty breathing from the pulmonary edema, and being separated from her baby likely were factors.  She felt comforted to know that if she feels that again while she is in the hospital, that chaplains are available to offer support.   She used the time with me to process some of her different emotions at this additional complication post-partum and about her joy of being a mother.   Please page at any hour for additional support.  Chaplain Dyanne CarrelKaty Vinod Mikesell, Bcc Pager, [phone] 2:31 PM    09/07/15 1400  Clinical Encounter Type  Visited With Patient  Visit Type Spiritual support  Referral From Nurse  Spiritual Encounters  Spiritual Needs Emotional

## 2015-09-07 NOTE — Lactation Note (Signed)
Lactation Consultation Note  Patient Name: Cynthia ChuteKeasha Trinkle JQZES'PToday's Date: 09/07/2015   Follow up with mom readmitted for retained placental fragments. Mom reports she is pumping every 3 hours and is getting up to 2.5 oz/pumping. Mom reports EBM amounts have increased since placental fragments removed.  Mom reports infant was here earlier today and is nursing well using NS. She reports she is having trouble with keeping infant awake at breast, reviewed awakening techniques with mom. Infant is not here currently, plan is for me to work with her tomorrow when infant is here.  Mom with large breasts and large everted nipples. Mom reports nipples are sore, excoriation is noted to both nipples, No bleeding noted. Mom reports scab came off left nipple this morning. Mom is using EBM to nipples. Discussed with mom that if nipples tend to get worse, we can talk to OB about APNO. Mom feels nipples are feeling better today. Gave mom inverted breast shells with instructions for use as clothing is rough to nipples. Mom reports infant will not latch without NS. Mom has not been pumping at home since d/c and reports infants weight has increased since d/c per Ped visit. Did discuss with mom making an OP appointment for follow up after d/c.   Mom reports engorgement is much better today, She notes there is a tender area under right breast, was unable to palpate lump, encouraged her to massage that area before and with pumping and feeding. She did have flu like symptoms yesterday, none today. She is not on ATB.    Enc mom to maintain pumping and to call with questions/concerns. Will f/u tomorrow.      Maternal Data    Feeding    LATCH Score/Interventions                      Lactation Tools Discussed/Used     Consult Status      Ed BlalockSharon S Byan Poplaski 09/07/2015, 4:42 PM

## 2015-09-07 NOTE — Progress Notes (Signed)
  Echocardiogram 2D Echocardiogram has been performed.  Cynthia Townsend, Cynthia Townsend M 09/07/2015, 3:23 PM

## 2015-09-08 LAB — CULTURE, GROUP A STREP (THRC)

## 2015-09-08 MED ORDER — BUTALBITAL-APAP-CAFFEINE 50-325-40 MG PO TABS: 1.0000 | ORAL_TABLET | ORAL | Status: DC | PRN

## 2015-09-08 MED FILL — BUTALBITAL/APAP/CAFFEINE TB: 50-325-40 | 4 days supply | Qty: 14 | Fill #0

## 2015-09-08 NOTE — Progress Notes (Signed)
Patient discharged home with husband... Discharge instructions reviewed with patient and she verbalized understanding... Condition stable... No equipment... Ambulated to car with B. Johna Sheriffrent, Charity fundraiserN.

## 2015-09-08 NOTE — Progress Notes (Signed)
S: Feeling significantly better today, desires discharge. Continues with occ HA relieved with Fioricet, denies dysphagia  Patient reports passed large clot yesterday O: VSS, afebrile, BP 156/82  FF non- tender, unable to express any clots  Perineum noted with 2 non- intact sutures, no bleeding or tenderness noted Lungs CTA ECHO - WNL, chest X-ray- improved  Group A strep culture pending  surgical pathology- Poc's A: pp pulmonary edema, s/p d and c for retained POC's P Discharge home . Instructions reviewed. RTO in 1 week

## 2015-09-08 NOTE — Lactation Note (Signed)
Lactation Consultation Note  Patient Name: Cynthia Townsend XBJYN'WToday's Date: 09/08/2015   Follow up with mom prior to d/c. Mom reports her nipple is feeling better today. She is using Breast shells and EBM to nipples. Mom is pumping every 3 hours and getting consistent milk volumes. Infant was in room with mom. Mom requested assistance with latching infant. Infant latched easily to left breast without NS. Infant latched easily. Lips did need to be flanged after latch, showed parents how to flange lips. Infant nursed rhythmically with frequent swallow. She was satisfied post feed and nipple was round in shape. Mom reports that pain was present with initial latch and then was gone with rest of feeding. Mom reports breast was softer post feed.  Enc mom to feed without NS as much as possible, but to use if infant will not latch. Follow up LC OP appt made for 4/6 @ 1pm. Enc mom to post pump if using NS with feeds to protect supply. Mom voiced understanding. Enc mom to call with questions/concerns prn.     Maternal Data    Feeding    LATCH Score/Interventions                      Lactation Tools Discussed/Used     Consult Status      Cynthia Townsend 09/08/2015, 10:45 AM

## 2015-09-08 NOTE — Discharge Summary (Signed)
Obstetric Discharge Summary Reason for Admission: pulmonary edema and pp bleeding Prenatal Procedures: ultrasound Intrapartum Procedures: spontaneous vaginal delivery Postpartum Procedures: curettage, antibiotics and chest x-ray and ECHO Complications-Operative and Postpartum: pulmonary edema  HEMOGLOBIN  Date Value Ref Range Status  09/06/2015 8.0* 12.0 - 15.0 g/dL Final   HCT  Date Value Ref Range Status  09/06/2015 25.0* 36.0 - 46.0 % Final    Physical Exam:  General: alert and cooperative Lochia: appropriate Uterine Fundus: firm Incision: healing well, 2 sutures noted not intact, no bleeding from laceration , non- tender DVT Evaluation: No evidence of DVT seen on physical exam. Negative Homan's sign. No cords or calf tenderness. No significant calf/ankle edema.  Discharge Diagnoses: Term Pregnancy-delivered  Discharge Information: Date: 09/08/2015 Activity: pelvic rest Diet: routine Medications: PNV and Fioricet Condition: improved Instructions: refer to practice specific booklet Discharge to: home   Newborn Data: Live born female  Birth Weight: 7 lb 11.6 oz (3504 g) APGAR: 8, 9  Baby previously discharged  Susanna Benge G 09/08/2015, 9:00 AM

## 2015-09-14 ENCOUNTER — Inpatient Hospital Stay (HOSPITAL_COMMUNITY): Admit: 2015-09-14 | Discharge: 2015-09-14 | Disposition: A | Discharge status: Home / Self Care | Payer: 59

## 2015-09-14 ENCOUNTER — Ambulatory Visit (HOSPITAL_COMMUNITY): Admission: RE | Admit: 2015-09-14 | Payer: 59 | Source: Ambulatory Visit

## 2015-09-14 DIAGNOSIS — Z130 Encounter for screening for diseases of the blood and blood-forming organs and certain disorders involving the immune mechanism: Secondary | ICD-10-CM | POA: Diagnosis not present

## 2015-09-14 DIAGNOSIS — Z13228 Encounter for screening for other metabolic disorders: Secondary | ICD-10-CM | POA: Diagnosis not present

## 2015-09-14 DIAGNOSIS — D649 Anemia, unspecified: Secondary | ICD-10-CM | POA: Diagnosis not present

## 2015-09-14 DIAGNOSIS — Z1329 Encounter for screening for other suspected endocrine disorder: Secondary | ICD-10-CM | POA: Diagnosis not present

## 2015-09-14 MED FILL — BUTALBITAL/APAP/CAFFEINE TB: 50-325-40 | 3 days supply | Qty: 20 | Fill #0

## 2015-09-14 NOTE — Lactation Note (Signed)
Lactation Consult  Mother's reason for visit:  13 Days and baby is not back to BW. Also mom has sore nipples and using a NS Visit Type:  Outpatient Appointment Notes:  Mom is here today with [facility], [place] for a feeding assessment. Mom has been using a NS and only feeding Cynthia on one breast. Today she was coached and was able to latch her to a bare breast. She transferred 1.5 oz. She was then repositioned on the left breast and transferred an additional 1.4 oz for a total of 2.9 oz. Informed her of the need to always offer both breasts. Mom was instructed to use breast compression and to post pump 4 times in 24 hours to protect her milk supply until Cynthia ScaleOlivia was gaining steadily. Suspect that feeding on both breasts will bring Cynthia back to BW very soon.  Follow-up tomorrow at ped, Monday at support group for weight check and Wed with lactation consultant.   Consult:  Initial Lactation Consultant:  Cynthia DryerJoseph, Cynthia Townsend  ________________________________________________________________________ Cynthia FloresBaby's Name: Cynthia Townsend Date of Birth: 09/01/2016 Pediatrician: Cynthia Townsend  Gender: female Gestational Age: 442w3d (At Birth) Birth Weight: 7 lb 11.6 oz (3504 g) Weight at Discharge: Weight: 7 lb 4.8 oz (3310 g)Date of [facility]: [facility]   09/01/15 1450 09/01/15 2341 09/02/15 2350  Weight: 7 lb 11.6 oz (3504 g) 7 lb 10.8 oz (3480 g) 7 lb 4.8 oz (3310 g)   [facility] 7#1.5 oz Location:[facility] Weight today: 7#2.7oz       ________________________________________________________________________  Mother's Name: Cynthia Townsend Type of delivery: Vaginal  Breastfeeding Experience:  P1 Maternal Medical Conditions:  retained placenta, pulmonary embolism, D and C, Anemia,  PPpreelclampsia migraines Maternal Medications:  PNC, Fiorecet. Flagyl,  iron  ________________________________________________________________________  Breastfeeding History (Post Discharge)  Frequency of breastfeeding:  8-13 times in 24 hours Duration of feeding:  Average 25 min  Supplements with expressed BM 1.5-2 oz every day.   Infant Intake and Output Assessment  Voids:  5in 24 hrs.  Color:  amber Stools:  5in 24 hrs.  Color:  Yellow but not large amounts  ________________________________________________________________________  Maternal Breast Assessment  Breast:  Full Nipple:  Erect tender at times Pain level:  0 at this consult Pain interventions:  Sore nipple shells  _______________________________________________________________________

## 2015-09-19 ENCOUNTER — Ambulatory Visit: Payer: 59 | Admitting: Family

## 2015-09-20 ENCOUNTER — Ambulatory Visit (INDEPENDENT_AMBULATORY_CARE_PROVIDER_SITE_OTHER): Payer: 59 | Admitting: Neurology

## 2015-09-20 ENCOUNTER — Ambulatory Visit (HOSPITAL_COMMUNITY): Payer: 59

## 2015-09-20 ENCOUNTER — Encounter: Payer: Self-pay | Admitting: Neurology

## 2015-09-20 VITALS — BP 139/91 | HR 81 | Ht 63.0 in | Wt 200.5 lb

## 2015-09-20 DIAGNOSIS — G43111 Migraine with aura, intractable, with status migrainosus: Secondary | ICD-10-CM

## 2015-09-20 DIAGNOSIS — G43109 Migraine with aura, not intractable, without status migrainosus: Secondary | ICD-10-CM

## 2015-09-20 HISTORY — DX: Migraine with aura, not intractable, without status migrainosus: G43.109

## 2015-09-20 MED ORDER — BUTALBITAL-APAP-CAFFEINE 50-325-40 MG PO TABS: 1.0000 | ORAL_TABLET | ORAL | Status: DC | PRN

## 2015-09-20 MED ORDER — PREDNISONE 5 MG PO TABS: ORAL_TABLET | ORAL | Status: DC

## 2015-09-20 MED ORDER — PROPRANOLOL HCL 20 MG PO TABS: 20.0000 mg | ORAL_TABLET | Freq: Two times a day (BID) | ORAL | Status: DC

## 2015-09-20 MED FILL — PROPRANOLOL 20 MG TABLET: 20 | 30 days supply | Qty: 60 | Fill #0

## 2015-09-20 MED FILL — BUTALBITAL/APAP/CAFFEINE TB: 50-325-40 | 5 days supply | Qty: 30 | Fill #0

## 2015-09-20 MED FILL — predniSONE 5 MG TABS: 5 | 6 days supply | Qty: 21 | Fill #0

## 2015-09-20 NOTE — Progress Notes (Signed)
Reason for visit: Migraine headache  Referring physician: Dr. Alcario Droughtomblin  Cynthia Townsend is a 35 y.o. female  History of present illness:  Cynthia Townsend is a 35 year old right-handed white female with a history of migraine headaches in the past. Her usual migraines are associated with a semicircle area of visual distortion on the right side surrounded by triangle shaped bright sparkly lights. The patient will then get a bitemporal headache occasionally associated with some nausea. The patient has had very few headaches throughout her life, but she delivered a child on 09/01/2015 and had postpartum preeclampsia within 3 or 4 days afterwards. The patient developed pulmonary edema with this. The patient began having headaches that were similar to her usual migraine with visual disturbances, and bitemporal headache. The patient will have some nausea that may come and go. The headaches have been daily since that time, the patient has been given oxycodone and Fioricet to take for the headache. The patient denies any numbness or weakness of the face, arms, or legs, she denies any confusion. She has photophobia and phonophobia with the headache. She may have some slight neck stiffness at times. She denies a significant family history of migraine. She has had some anemia following delivery, currently on iron supplementation. The patient is sent to this office for an evaluation.  Past Medical History  Diagnosis Date  . Anxiety   . ADD (attention deficit disorder)   . Sciatic pain   . Chicken pox   . Depression   . Subclinical hypothyroidism   . Insomnia   . Vitamin D deficiency   . Nasal polyps   . Ectopic pregnancy   . Pregnancy induced hypertension   . Classic migraine with aura 09/20/2015    Past Surgical History  Procedure Laterality Date  . Breast biopsy      benign  . Dilation and evacuation N/A 09/04/2015    Procedure: DILATATION AND EVACUATION;  Surgeon: Mitchel HonourMegan Morris, DO;  Location: [facility];  Service: Gynecology;  Laterality: N/A;    Family History  Problem Relation Age of Onset  . Alcohol abuse Mother   . Lung cancer Mother   . Hyperlipidemia Mother   . Heart disease Mother   . Mental illness Mother   . Hyperthyroidism Mother   . COPD Mother   . Hyperlipidemia Father   . Heart disease Father   . Transient ischemic attack Father   . Pulmonary embolism Father   . Heart disease Sister   . Mental illness Sister   . Heart disease Maternal Uncle   . Rheum arthritis Maternal Grandmother     Social history:  reports that she has quit smoking. Her smoking use included Cigarettes. She has never used smokeless tobacco. She reports that she does not drink alcohol or use illicit drugs.  Medications:  Prior to Admission medications   Medication Sig Start Date End Date Taking? Authorizing Provider  cholecalciferol (VITAMIN D) 1000 units tablet Take 5,000 Units by mouth daily.   Yes Historical Provider, MD  docusate sodium (STOOL SOFTENER) 100 MG capsule Take 100 mg by mouth 2 (two) times daily.   Yes Historical Provider, MD  butalbital-acetaminophen-caffeine (FIORICET, ESGIC) [phone] MG tablet Take 1 tablet by mouth every 4 (four) hours as needed for headache. 09/08/15   Julio Sicksarol Curtis, NP  ferrous sulfate (FERROUSUL) 325 (65 FE) MG tablet Take 1 tablet (325 mg total) by mouth 2 (two) times daily with a meal. Patient not taking: Reported on 09/04/2015 09/03/15  Harold Hedge, MD  ibuprofen (ADVIL,MOTRIN) 600 MG tablet Take 1 tablet (600 mg total) by mouth every 6 (six) hours as needed. Patient taking differently: Take 600 mg by mouth every 6 (six) hours as needed for moderate pain or cramping.  09/03/15   Harold Hedge, MD  oxyCODONE-acetaminophen (PERCOCET/ROXICET) 5-325 MG tablet Take 1-2 tablets by mouth every 6 (six) hours as needed (pain scale 4-7). 09/03/15   Harold Hedge, MD  Prenatal Vit-Fe Fumarate-FA (PRENATAL MULTIVITAMIN) TABS tablet Take 1 tablet by mouth at bedtime.      Historical Provider, MD      Allergies  Allergen Reactions  . Penicillins Shortness Of Breath, Swelling and Other (See Comments)    Has patient had a PCN reaction causing immediate rash, facial/tongue/throat swelling, SOB or lightheadedness with hypotension:Unknown Has patient had a PCN reaction causing severe rash involving mucus membranes or skin necrosis: unknown Has patient had a PCN reaction that required hospitalization unknown Has patient had a PCN reaction occurring within the last 10 years: uknown If all of the above answers are "NO", then may proceed with Cephalosporin use.     . Adhesive [Tape] Rash  . Sulfa Antibiotics Hives and Rash    ROS:  Out of a complete 14 system review of symptoms, the patient complains only of the following symptoms, and all other reviewed systems are negative.  Fatigue Chest pain Itching Blurred vision Anemia Headache Anxiety  Blood pressure 139/91, pulse 81, height  (1.6 m), weight 200 lb 8 oz (90.946 kg), currently breastfeeding.  Physical Exam  General: The patient is alert and cooperative at the time of the examination. The patient is moderately obese.  Eyes: Pupils are equal, round, and reactive to light. Discs are flat bilaterally.  Neck: The neck is supple, no carotid bruits are noted.  Respiratory: The respiratory examination is clear.  Cardiovascular: The cardiovascular examination reveals a regular rate and rhythm, no obvious murmurs or rubs are noted.  Neuromuscular: Range of movement of the cervical spine is full. Some crepitus is noted in the right greater than left temporomandibular joint with jaw opening and closure.  Skin: Extremities are without significant edema.  Neurologic Exam  Mental status: The patient is alert and oriented x 3 at the time of the examination. The patient has apparent normal recent and remote memory, with an apparently normal attention span and concentration ability.  Cranial  nerves: Facial symmetry is present. There is good sensation of the face to pinprick and soft touch bilaterally. The strength of the facial muscles and the muscles to head turning and shoulder shrug are normal bilaterally. Speech is well enunciated, no aphasia or dysarthria is noted. Extraocular movements are full. Visual fields are full. The tongue is midline, and the patient has symmetric elevation of the soft palate. No obvious hearing deficits are noted.  Motor: The motor testing reveals 5 over 5 strength of all 4 extremities. Good symmetric motor tone is noted throughout.  Sensory: Sensory testing is intact to pinprick, soft touch, vibration sensation, and position sense on all 4 extremities. No evidence of extinction is noted.  Coordination: Cerebellar testing reveals good finger-nose-finger and heel-to-shin bilaterally.  Gait and station: Gait is normal. Tandem gait is normal. Romberg is negative. No drift is seen.  Reflexes: Deep tendon reflexes are symmetric and normal bilaterally. Toes are downgoing bilaterally.   Assessment/Plan:  1. Classic migraine, converted migraine  2. Postpartum preeclampsia  Cynthia Townsend is having converted migraine, she has classic migraine and her  current headache simulate this. She is breast-feeding currently. We will start propranolol in low dose, the patient will take Fioricet if needed for pain. She will be placed on a 5 mg 6 day prednisone dosepak. She will follow-up in 6-8 weeks. She is contact us if she is having issues with her headaches or with her medication. If the headaches do not abate, MRI evaluation of the brain will be done. The patient also reports some episodes of dizzy sensations when she turns her head to the left or right with standing, this predates the pregnancy, and has been going on for several years. She has not had any blackout episodes with these events. A prescription for the Fioricet was also given.  Marlan Palau MD 09/20/2015  7:53 PM  [facility] [address] [place], [place] [address]  Phone [phone] Fax [phone]

## 2015-09-25 ENCOUNTER — Ambulatory Visit (INDEPENDENT_AMBULATORY_CARE_PROVIDER_SITE_OTHER): Payer: 59 | Admitting: Family

## 2015-09-25 ENCOUNTER — Encounter: Payer: Self-pay | Admitting: Family

## 2015-09-25 VITALS — BP 110/70 | HR 81 | Temp 98.0°F | Resp 16 | Ht 63.0 in | Wt 200.4 lb

## 2015-09-25 DIAGNOSIS — F909 Attention-deficit hyperactivity disorder, unspecified type: Secondary | ICD-10-CM

## 2015-09-25 DIAGNOSIS — F411 Generalized anxiety disorder: Secondary | ICD-10-CM

## 2015-09-25 DIAGNOSIS — F988 Other specified behavioral and emotional disorders with onset usually occurring in childhood and adolescence: Secondary | ICD-10-CM

## 2015-09-25 MED ORDER — ALPRAZOLAM 0.25 MG PO TABS: 0.2500 mg | ORAL_TABLET | Freq: Every day | ORAL | Status: DC | PRN

## 2015-09-25 MED ORDER — AMPHETAMINE-DEXTROAMPHETAMINE 5 MG PO TABS: 5.0000 mg | ORAL_TABLET | Freq: Two times a day (BID) | ORAL | Status: DC

## 2015-09-25 MED FILL — AMPHETAMINE SALTS 5 MG TAB: 5 | 30 days supply | Qty: 60 | Fill #0

## 2015-09-25 MED FILL — ALPRAZolam 0.25 MG TABS: 0.25 | 20 days supply | Qty: 20 | Fill #0

## 2015-09-25 NOTE — Assessment & Plan Note (Signed)
Experiencing increased lack of concentration. Has been off of medication since she was pregnant. Discussed risks associated with use of Adderall and potential side effects. UpToDate reviewed with recommendations for potential low dose. Advised of adverse symptoms to watch for. Start Adderall. Follow up in 1 month.

## 2015-09-25 NOTE — Progress Notes (Signed)
Subjective:    Patient ID: Cynthia Townsend, female    DOB: 05/03/1981, 35 y.o.   MRN: [id number]  Chief Complaint  Patient presents with  . Follow-up    just had a baby and wants to see about starting her xanax and adderall back    HPI:  Cynthia Townsend is a 35 y.o. female who  has a past medical history of Anxiety; ADD (attention deficit disorder); Sciatic pain; Chicken pox; Depression; Subclinical hypothyroidism; Insomnia; Vitamin D deficiency; Nasal polyps; Ectopic pregnancy; Pregnancy induced hypertension; and Classic migraine with aura (09/20/2015). and presents today for an office follow-up.  1.) Anxiety - Previously maintained on alprazolam prior to pregnancy. Recently experienced a panic attack while in the hospital following delivery. She was given low-dose alprazolam which helped to control. Her anxiety is improved since her pregnancy and is wishing to use the medication only as needed.   2.) ADHD - Previous maintained on Adderall. Reports since stopping the medication she has had increased difficulty with concentration and inability to complete tasks. Denies adverse side effects when taking the medication. No chest pain, heart palpitations or shortness of breath.   Wt Readings from Last 3 Encounters:  09/25/15 200 lb 6.4 oz (90.901 kg)  09/20/15 200 lb 8 oz (90.946 kg)  09/07/15 207 lb 12 oz (94.235 kg)    Allergies  Allergen Reactions  . Penicillins Shortness Of Breath, Swelling and Other (See Comments)    Has patient had a PCN reaction causing immediate rash, facial/tongue/throat swelling, SOB or lightheadedness with hypotension:Unknown Has patient had a PCN reaction causing severe rash involving mucus membranes or skin necrosis: unknown Has patient had a PCN reaction that required hospitalization unknown Has patient had a PCN reaction occurring within the last 10 years: uknown If all of the above answers are "NO", then may proceed with Cephalosporin use.     . Sulfa  Antibiotics Hives and Rash     Current Outpatient Prescriptions on File Prior to Visit  Medication Sig Dispense Refill  . butalbital-acetaminophen-caffeine (FIORICET, ESGIC) 50-325-40 MG tablet Take 1 tablet by mouth every 4 (four) hours as needed for headache. 30 tablet 1  . cholecalciferol (VITAMIN D) 1000 units tablet Take 5,000 Units by mouth daily.    Marland Kitchen. docusate sodium (STOOL SOFTENER) 100 MG capsule Take 100 mg by mouth 2 (two) times daily.    . ferrous sulfate (FERROUSUL) 325 (65 FE) MG tablet Take 1 tablet (325 mg total) by mouth 2 (two) times daily with a meal. 60 tablet 3  . ibuprofen (ADVIL,MOTRIN) 600 MG tablet Take 1 tablet (600 mg total) by mouth every 6 (six) hours as needed. (Patient taking differently: Take 600 mg by mouth every 6 (six) hours as needed for moderate pain or cramping. ) 30 tablet 0  . oxyCODONE-acetaminophen (PERCOCET/ROXICET) 5-325 MG tablet Take 1-2 tablets by mouth every 6 (six) hours as needed (pain scale 4-7). 30 tablet 0  . predniSONE (DELTASONE) 5 MG tablet Begin taking 6 tablets daily, taper by one tablet daily until off the medication. 21 tablet 0  . Prenatal Vit-Fe Fumarate-FA (PRENATAL MULTIVITAMIN) TABS tablet Take 1 tablet by mouth at bedtime.     . propranolol (INDERAL) 20 MG tablet Take 1 tablet (20 mg total) by mouth 2 (two) times daily. 60 tablet 2   No current facility-administered medications on file prior to visit.     Review of Systems  Constitutional: Negative for fever and chills.  Respiratory: Negative for chest tightness and shortness  of breath.   Cardiovascular: Negative for chest pain, palpitations and leg swelling.  Neurological: Negative for headaches.  Psychiatric/Behavioral: Positive for decreased concentration. The patient is nervous/anxious.       Objective:    BP 110/70 mmHg  Pulse 81  Temp(Src) 98 F (36.7 C) (Oral)  Resp 16  Ht  (1.6 m)  Wt 200 lb 6.4 oz (90.901 kg)  BMI 35.51 kg/m2  SpO2 97% Nursing note  and vital signs reviewed.  Physical Exam  Constitutional: She is oriented to person, place, and time. She appears well-developed and well-nourished. No distress.  Cardiovascular: Normal rate, regular rhythm, normal heart sounds and intact distal pulses.   Pulmonary/Chest: Effort normal and breath sounds normal.  Neurological: She is alert and oriented to person, place, and time.  Skin: Skin is warm and dry.  Psychiatric: She has a normal mood and affect. Her behavior is normal. Judgment and thought content normal.       Assessment & Plan:   Problem List Items Addressed This Visit      Other   ADD (attention deficit disorder)    Experiencing increased lack of concentration. Has been off of medication since she was pregnant. Discussed risks associated with use of Adderall and potential side effects. UpToDate reviewed with recommendations for potential low dose. Advised of adverse symptoms to watch for. Start Adderall. Follow up in 1 month.       Relevant Medications   amphetamine-dextroamphetamine (ADDERALL) 5 MG tablet   Generalized anxiety disorder - Primary    Anxiety is overall improved however continues to experience occasional symptoms. Discussed risks associated with use of alprazolam with lactation and breast feeding. Wishes to continue with mediation on an as needed basis. Refill Xanax. She will also observe for any changes in infant behaviors.       Relevant Medications   ALPRAZolam (XANAX) 0.25 MG tablet       I am having Ms. Mcphillips start on ALPRAZolam and amphetamine-dextroamphetamine. I am also having her maintain her prenatal multivitamin, ibuprofen, oxyCODONE-acetaminophen, ferrous sulfate, cholecalciferol, docusate sodium, predniSONE, propranolol, and butalbital-acetaminophen-caffeine.   Meds ordered this encounter  Medications  . ALPRAZolam (XANAX) 0.25 MG tablet    Sig: Take 1 tablet (0.25 mg total) by mouth daily as needed for anxiety.    Dispense:  20 tablet     Refill:  0    Order Specific Question:  Supervising Provider    Answer:  Hillard Danker A [[id number]]  . amphetamine-dextroamphetamine (ADDERALL) 5 MG tablet    Sig: Take 1 tablet (5 mg total) by mouth 2 (two) times daily with a meal.    Dispense:  60 tablet    Refill:  0    Order Specific Question:  Supervising Provider    Answer:  Hillard Danker A [[id number]]     Follow-up: Return in about 1 month (around 10/25/2015), or if symptoms worsen or fail to improve.  Jeanine Luz, FNP

## 2015-09-25 NOTE — Patient Instructions (Signed)
Thank you for choosing [facility].  Summary/Instructions:  Your prescription(s) have been submitted to your pharmacy or been printed and provided for you. Please take as directed and contact our office if you believe you are having problem(s) with the medication(s) or have any questions.  If your symptoms worsen or fail to improve, please contact our office for further instruction, or in case of emergency go directly to the emergency room at the closest medical facility.     

## 2015-09-25 NOTE — Assessment & Plan Note (Signed)
Anxiety is overall improved however continues to experience occasional symptoms. Discussed risks associated with use of alprazolam with lactation and breast feeding. Wishes to continue with mediation on an as needed basis. Refill Xanax. She will also observe for any changes in infant behaviors.

## 2015-09-25 NOTE — Progress Notes (Signed)
Pre visit review using our clinic review tool, if applicable. No additional management support is needed unless otherwise documented below in the visit note. 

## 2015-09-28 DIAGNOSIS — D649 Anemia, unspecified: Secondary | ICD-10-CM | POA: Diagnosis not present

## 2015-09-28 DIAGNOSIS — R799 Abnormal finding of blood chemistry, unspecified: Secondary | ICD-10-CM | POA: Diagnosis not present

## 2015-10-10 MED FILL — BUTALB-ACETAMIN-CAFF 50-325: 50-325-40 | 5 days supply | Qty: 30 | Fill #1

## 2015-10-12 ENCOUNTER — Telehealth: Payer: Self-pay | Admitting: Adult Health

## 2015-10-12 DIAGNOSIS — Z1389 Encounter for screening for other disorder: Secondary | ICD-10-CM | POA: Diagnosis not present

## 2015-10-12 MED FILL — NORETHINDRONE 0.35 MG TAB: 0.35 | 28 days supply | Qty: 28 | Fill #0

## 2015-10-12 MED FILL — MEGESTROL 40 MG TABLET: 40 | 30 days supply | Qty: 60 | Fill #0

## 2015-10-12 NOTE — Telephone Encounter (Signed)
Patient reports worsening of HA's.  An appointment was made for 10/16/15 with Megan(she is pt of Dr Anne HahnWillis). Advised that the nurse would call if there was any other questions.

## 2015-10-12 NOTE — Telephone Encounter (Signed)
Spoke to pt and she stated her headaches are worsening.  She has some positive effect from fiorcet, but just saw obgyn for 6 wk f/u on having newborn and will be started on BCP (which can lessen effectiveness).  Her headaches have noted no change from prednisone or propranolol.  Finished the prednisone 1 1/2 weeks ago.  Having sharp pains L, R , and top of head.  Level 7.  Made appt with CM/NP tomorrow to evaluate.

## 2015-10-13 ENCOUNTER — Encounter: Payer: Self-pay | Admitting: Nurse Practitioner

## 2015-10-13 ENCOUNTER — Ambulatory Visit (INDEPENDENT_AMBULATORY_CARE_PROVIDER_SITE_OTHER): Payer: 59 | Admitting: Nurse Practitioner

## 2015-10-13 VITALS — BP 113/71 | HR 70 | Ht 63.0 in | Wt 197.4 lb

## 2015-10-13 DIAGNOSIS — G43111 Migraine with aura, intractable, with status migrainosus: Secondary | ICD-10-CM

## 2015-10-13 MED ORDER — PREDNISONE 10 MG PO TABS
10.0000 mg | ORAL_TABLET | Freq: Every day | ORAL | Status: DC
Start: 2015-10-13 — End: 2015-10-25

## 2015-10-13 NOTE — Progress Notes (Signed)
[facility]  PATIENT: Cynthia Townsend DOB: Nov 14, 1980   REASON FOR VISIT: Follow-up for postpartum headaches,  HISTORY FROM: Patient    HISTORY OF PRESENT ILLNESS: UPDATE 5/5 17[name], 35 year old female returns for follow-up. She was evaluated for common migraine by Dr. Anne Townsend 09/20/2015. Her usual migraine is associated with a semicircle area of visual distortion on the right side when she can have some right sparkly lights surrounded by triangle. She will then get a bitemporal headache. She can have nausea, she denies any vomiting. She has some photophobia and occasionally phonophobia with the headache. She denies any neck stiffness. She is also complaining with some neuralgia type sensations that are brief and may last 1 or 2 minutes but occur several times a day and her piercing in nature. This occurs over various areas of his head and can have a throbbing stabbing character. When last seen she was placed on Inderal 20 mg twice daily and given a prednisone Dosepak which she felt was beneficial. In addition she has recently been placed back on birth control, however she continues to breast-feed and the right posterior tibial will hold on while he was in which is all she is agreeable.   will for the next several weeks. She returns to work the end of the month. She feels her headaches are no better or worse. She denies any numbness or weakness of the face arms or legs.. She has not had an MRI of the brain and she is  questioning this. She returns for reevaluation  09/20/15 Cynthia Townsend. Cynthia Townsend is a 35 year old right-handed white female with a history of migraine headaches in the past. Her usual migraines are associated with a semicircle area of visual distortion on the right side surrounded by triangle shaped bright sparkly lights. The patient will then get a bitemporal headache occasionally associated with some nausea. The patient has had very few headaches throughout her life,  but she delivered a child on 09/01/2015 and had postpartum preeclampsia within 3 or 4 days afterwards. The patient developed pulmonary edema with this. The patient began having headaches that were similar to her usual migraine with visual disturbances, and bitemporal headache. The patient will have some nausea that may come and go. The headaches have been daily since that time, the patient has been given oxycodone and Fioricet to take for the headache. The patient denies any numbness or weakness of the face, arms, or legs, she denies any confusion. She has photophobia and phonophobia with the headache. She may have some slight neck stiffness at times. She denies a significant family history of migraine. She has had some anemia following delivery, currently on iron supplementation. The patient is sent to this office for an evaluation.  REVIEW OF SYSTEMS: Full 14 system review of systems performed and notable only for those listed, all others are neg:  Constitutional: neg  Cardiovascular: neg Ear/Nose/Throat: neg  Skin: neg Eyes: neg Respiratory: neg Gastroitestinal: neg  Hematology/Lymphatic: neg  Endocrine: neg Musculoskeletal:neg Allergy/Immunology: Environmental allergies Neurological: Migraine headache Psychiatric: neg Sleep : neg   ALLERGIES: Allergies  Allergen Reactions  . Penicillins Shortness Of Breath, Swelling and Other (See Comments)    Has patient had a PCN reaction causing immediate rash, facial/tongue/throat swelling, SOB or lightheadedness with hypotension:Unknown Has patient had a PCN reaction causing severe rash involving mucus membranes or skin necrosis: unknown Has patient had a PCN reaction that required hospitalization unknown Has patient had a PCN reaction occurring within the last 10 years: uknown If  all of the above answers are "NO", then may proceed with Cephalosporin use.     . Sulfa Antibiotics Hives and Rash    HOME MEDICATIONS: Outpatient Prescriptions  Prior to Visit  Medication Sig Dispense Refill  . ALPRAZolam (XANAX) 0.25 MG tablet Take 1 tablet (0.25 mg total) by mouth daily as needed for anxiety. 20 tablet 0  . amphetamine-dextroamphetamine (ADDERALL) 5 MG tablet Take 1 tablet (5 mg total) by mouth 2 (two) times daily with a meal. 60 tablet 0  . butalbital-acetaminophen-caffeine (FIORICET, ESGIC) 50-325-40 MG tablet Take 1 tablet by mouth every 4 (four) hours as needed for headache. 30 tablet 1  . cholecalciferol (VITAMIN D) 1000 units tablet Take 5,000 Units by mouth daily.    Marland Kitchen docusate sodium (STOOL SOFTENER) 100 MG capsule Take 100 mg by mouth 3 (three) times daily.     . ferrous sulfate (FERROUSUL) 325 (65 FE) MG tablet Take 1 tablet (325 mg total) by mouth 2 (two) times daily with a meal. (Patient taking differently: Take 325 mg by mouth daily with breakfast. ) 60 tablet 3  . ibuprofen (ADVIL,MOTRIN) 600 MG tablet Take 1 tablet (600 mg total) by mouth every 6 (six) hours as needed. (Patient taking differently: Take 600 mg by mouth every 6 (six) hours as needed for moderate pain or cramping. ) 30 tablet 0  . oxyCODONE-acetaminophen (PERCOCET/ROXICET) 5-325 MG tablet Take 1-2 tablets by mouth every 6 (six) hours as needed (pain scale 4-7). 30 tablet 0  . Prenatal Vit-Fe Fumarate-FA (PRENATAL MULTIVITAMIN) TABS tablet Take 1 tablet by mouth at bedtime.     . propranolol (INDERAL) 20 MG tablet Take 1 tablet (20 mg total) by mouth 2 (two) times daily. 60 tablet 2  . predniSONE (DELTASONE) 5 MG tablet Begin taking 6 tablets daily, taper by one tablet daily until off the medication. 21 tablet 0   No facility-administered medications prior to visit.    PAST MEDICAL HISTORY: Past Medical History  Diagnosis Date  . Anxiety   . ADD (attention deficit disorder)   . Sciatic pain   . Chicken pox   . Depression   . Subclinical hypothyroidism   . Insomnia   . Vitamin D deficiency   . Nasal polyps   . Ectopic pregnancy   . Pregnancy induced  hypertension   . Classic migraine with aura 09/20/2015    PAST SURGICAL HISTORY: Past Surgical History  Procedure Laterality Date  . Breast biopsy      benign  . Dilation and evacuation N/A 09/04/2015    Procedure: DILATATION AND EVACUATION;  Surgeon: Mitchel Honour, DO;  Location: [facility];  Service: Gynecology;  Laterality: N/A;    FAMILY HISTORY: Family History  Problem Relation Age of Onset  . Alcohol abuse Mother   . Lung cancer Mother   . Hyperlipidemia Mother   . Heart disease Mother   . Mental illness Mother   . Hyperthyroidism Mother   . COPD Mother   . Hyperlipidemia Father   . Heart disease Father   . Transient ischemic attack Father   . Pulmonary embolism Father   . Heart disease Sister   . Mental illness Sister   . Heart disease Maternal Uncle   . Rheum arthritis Maternal Grandmother     SOCIAL HISTORY: Social History   Social History  . Marital Status: Single    Spouse Name: N/A  . Number of Children: 0  . Years of Education: 16   Occupational History  . CMA  [employer]   Social History Main Topics  . Smoking status: Former Smoker    Types: Cigarettes  . Smokeless tobacco: Never Used  . Alcohol Use: No  . Drug Use: No  . Sexual Activity: Yes    Birth Control/ Protection: None   Other Topics Concern  . Not on file   Social History Narrative   Lives at home w/ her fiance and new daughter.   Right-handed.   Drinks about 1-2 cups of caffeine per day.   Fun: Hiking, Zumba, sleep   Denies religious beliefs effecting health care.   Denies abuse and feels safe at home.      PHYSICAL EXAM  Filed Vitals:   10/13/15 0958  BP: 113/71  Pulse: 70  Height: 5\' 3"  (1.6 m)  Weight: 197 lb 6.4 oz (89.54 kg)   Body mass index is 34.98 kg/(m^2).  Generalized: Well developed, moderately obese female in no acute distress  Head: normocephalic and atraumatic,. Oropharynx benign  Neck: Supple, no carotid bruits  Cardiac: Regular rate rhythm, no  murmur  Musculoskeletal: Crepitus is noted in the right greater than left TMJ opening and closure  Neurological examination   Mentation: Alert oriented to time, place, history taking. Attention span and concentration appropriate. Recent and remote memory intact.  Follows all commands speech and language fluent.   Cranial nerve II-XII: Fundoscopic exam reveals sharp disc margins.Pupils were equal round reactive to light extraocular movements were full, visual field were full on confrontational test. Facial sensation and strength were normal. hearing was intact to finger rubbing bilaterally. Uvula tongue midline. head turning and shoulder shrug were normal and symmetric.Tongue protrusion into cheek strength was normal. Motor: normal bulk and tone, full strength in the BUE, BLE, fine finger movements normal, no pronator drift. No focal weakness Sensory: normal and symmetric to light touch, pinprick, and  Vibration, proprioception . No evidence of extinction Coordination: finger-nose-finger, heel-to-shin bilaterally, no dysmetria Reflexes: Brachioradialis 2/2, biceps 2/2, triceps 2/2, patellar 2/2, Achilles 2/2, plantar responses were flexor bilaterally. Gait and Station: Rising up from seated position without assistance, normal stance,  moderate stride, good arm swing, smooth turning, able to perform tiptoe, and heel walking without difficulty. Tandem gait is steady  DIAGNOSTIC DATA (LABS, IMAGING, TESTING) - I reviewed patient records, labs, notes, testing and imaging myself where available.  Lab Results  Component Value Date   WBC 9.6 09/06/2015   HGB 8.0* 09/06/2015   HCT 25.0* 09/06/2015   MCV 87.7 09/06/2015   PLT 270 09/06/2015      Component Value Date/Time   NA 140 09/06/2015 0553   K 3.6 09/06/2015 0553   CL 106 09/06/2015 0553   CO2 25 09/06/2015 0553   GLUCOSE 82 09/06/2015 0553   BUN 12 09/06/2015 0553   CREATININE 0.47 09/06/2015 0553   CALCIUM 7.6* 09/06/2015 0553   PROT  5.6* 09/06/2015 0553   ALBUMIN 2.6* 09/06/2015 0553   AST 28 09/06/2015 0553   ALT 26 09/06/2015 0553   ALKPHOS 119 09/06/2015 0553   BILITOT 0.4 09/06/2015 0553   GFRNONAA >60 09/06/2015 0553   GFRAA >60 09/06/2015 0553    ASSESSMENT AND PLAN  35 y.o. year old female  has a past medical history of Anxiety; ADD (attention deficit disorder);  and Classic migraine with aura (09/20/2015). And postpartum preeclampsia here to follow-up. She is continuing with frequent headache, she is continuing to breast-feed but plans to only do this for several more weeks. She is back on her  birth-control pills. She also has a history of episodes of dizzy sensations when she turns her head and this is been going on for several years she has never had any syncopal episodes or blackouts.  PLAN: Will get MRI of the brain since her headaches have not abated Continue Inderal at current dose Given a list of foods which are triggers and discussed these Reorder Prednisone dose pack I spent additional 15 minutes in total face to face time with the patient more than 50% of which was spent counseling and coordination of care, reviewing medications and discussing and reviewing the diagnosis of migraine and further treatment options. Importance of keeping a diary  to include the time of the headache what you're doing any other specific information that would be useful. Discussed stress relief techniques such as deep breathing muscle relaxation mental relaxation to music. Discussed importance of exercise, regular meals  and sleep. Sleep deprivation can be a migraine trigger Follow up in 1 month Vst time 40 min Nilda Riggs, [facility], [facility], APRN  [facility] [address] [place], [place] [address] [phone]

## 2015-10-13 NOTE — Progress Notes (Signed)
I have read the note, and I agree with the clinical assessment and plan.  Cynthia Townsend KEITH   

## 2015-10-13 NOTE — Patient Instructions (Signed)
Will get MRI of the brain  Continue Inderal at current dose Given a list of foods Reorder Prednisone dose pack Follow up in 1 month

## 2015-10-16 ENCOUNTER — Ambulatory Visit: Payer: 59 | Admitting: Adult Health

## 2015-10-19 ENCOUNTER — Ambulatory Visit (HOSPITAL_COMMUNITY)
Admission: RE | Admit: 2015-10-19 | Discharge: 2015-10-19 | Disposition: A | Discharge status: Home / Self Care | Payer: 59 | Source: Ambulatory Visit | Attending: Nurse Practitioner | Admitting: Nurse Practitioner

## 2015-10-19 DIAGNOSIS — G43111 Migraine with aura, intractable, with status migrainosus: Secondary | ICD-10-CM | POA: Diagnosis not present

## 2015-10-19 DIAGNOSIS — G930 Cerebral cysts: Secondary | ICD-10-CM | POA: Diagnosis not present

## 2015-10-19 DIAGNOSIS — M654 Radial styloid tenosynovitis [de Quervain]: Secondary | ICD-10-CM | POA: Diagnosis not present

## 2015-10-19 DIAGNOSIS — G43909 Migraine, unspecified, not intractable, without status migrainosus: Secondary | ICD-10-CM | POA: Diagnosis not present

## 2015-10-19 MED ORDER — GADOBENATE DIMEGLUMINE 529 MG/ML IV SOLN
18.0000 mL | Freq: Once | INTRAVENOUS | Status: AC | PRN
  Administered 2015-10-19: 18 mL via INTRAVENOUS

## 2015-10-20 ENCOUNTER — Telehealth: Payer: Self-pay | Admitting: *Deleted

## 2015-10-20 NOTE — Telephone Encounter (Signed)
Copy MRI mailed to pt.

## 2015-10-25 ENCOUNTER — Ambulatory Visit (INDEPENDENT_AMBULATORY_CARE_PROVIDER_SITE_OTHER): Payer: 59 | Admitting: Family

## 2015-10-25 ENCOUNTER — Encounter: Payer: Self-pay | Admitting: Family

## 2015-10-25 VITALS — BP 126/78 | HR 78 | Temp 97.8°F | Resp 16 | Ht 63.0 in | Wt 192.4 lb

## 2015-10-25 DIAGNOSIS — F909 Attention-deficit hyperactivity disorder, unspecified type: Secondary | ICD-10-CM | POA: Diagnosis not present

## 2015-10-25 DIAGNOSIS — F988 Other specified behavioral and emotional disorders with onset usually occurring in childhood and adolescence: Secondary | ICD-10-CM

## 2015-10-25 MED ORDER — AMPHETAMINE-DEXTROAMPHETAMINE 10 MG PO TABS: 10.0000 mg | ORAL_TABLET | Freq: Three times a day (TID) | ORAL | Status: DC | PRN

## 2015-10-25 NOTE — Assessment & Plan Note (Signed)
Attention remains decreased with the current dosage with example of overflowing the sink recently because she got distracted. Increase Adderall with caution. Discussed associated risks of increasing Adderall with her continued breast feeding. She will continue to monitor and discuss with pediatrics at next office visit. No significant effects noted presently.

## 2015-10-25 NOTE — Patient Instructions (Signed)
Thank you for choosing [facility].  Summary/Instructions:  Your prescription(s) have been submitted to your pharmacy or been printed and provided for you. Please take as directed and contact our office if you believe you are having problem(s) with the medication(s) or have any questions.  If your symptoms worsen or fail to improve, please contact our office for further instruction, or in case of emergency go directly to the emergency room at the closest medical facility.     

## 2015-10-25 NOTE — Progress Notes (Signed)
Subjective:    Patient ID: Cynthia Townsend, female    DOB: 09/05/1980, 35 y.o.   MRN: [id number]  Chief Complaint  Patient presents with  . Follow-up    states that the low dose adderall is not working good     HPI:  Cynthia Townsend is a 35 y.o. female who  has a past medical history of Anxiety; ADD (attention deficit disorder); Sciatic pain; Chicken pox; Depression; Subclinical hypothyroidism; Insomnia; Vitamin D deficiency; Nasal polyps; Ectopic pregnancy; Pregnancy induced hypertension; and Classic migraine with aura (09/20/2015). and presents today for a follow up office visit.   ADD - Currently maintained on 5 mg of Adderall. Reports taking medication as prescribed and denies adverse side effects. Notes her symptoms are not adequately controlled with his current medication regimen. She was started on a low dose secondary to breast-feeding.  Allergies  Allergen Reactions  . Penicillins Shortness Of Breath, Swelling and Other (See Comments)    Has patient had a PCN reaction causing immediate rash, facial/tongue/throat swelling, SOB or lightheadedness with hypotension:Unknown Has patient had a PCN reaction causing severe rash involving mucus membranes or skin necrosis: unknown Has patient had a PCN reaction that required hospitalization unknown Has patient had a PCN reaction occurring within the last 10 years: uknown If all of the above answers are "NO", then may proceed with Cephalosporin use.     . Sulfa Antibiotics Hives and Rash     Current Outpatient Prescriptions on File Prior to Visit  Medication Sig Dispense Refill  . ALPRAZolam (XANAX) 0.25 MG tablet Take 1 tablet (0.25 mg total) by mouth daily as needed for anxiety. 20 tablet 0  . butalbital-acetaminophen-caffeine (FIORICET, ESGIC) 50-325-40 MG tablet Take 1 tablet by mouth every 4 (four) hours as needed for headache. 30 tablet 1  . cholecalciferol (VITAMIN D) 1000 units tablet Take 5,000 Units by mouth daily.    Marland Kitchen.  docusate sodium (STOOL SOFTENER) 100 MG capsule Take 100 mg by mouth 3 (three) times daily.     Marland Kitchen. FENUGREEK PO Take by mouth. 1 cap daily    . ferrous sulfate (FERROUSUL) 325 (65 FE) MG tablet Take 1 tablet (325 mg total) by mouth 2 (two) times daily with a meal. (Patient taking differently: Take 325 mg by mouth daily with breakfast. ) 60 tablet 3  . ibuprofen (ADVIL,MOTRIN) 600 MG tablet Take 1 tablet (600 mg total) by mouth every 6 (six) hours as needed. (Patient taking differently: Take 600 mg by mouth every 6 (six) hours as needed for moderate pain or cramping. ) 30 tablet 0  . oxyCODONE-acetaminophen (PERCOCET/ROXICET) 5-325 MG tablet Take 1-2 tablets by mouth every 6 (six) hours as needed (pain scale 4-7). 30 tablet 0  . Prenatal Vit-Fe Fumarate-FA (PRENATAL MULTIVITAMIN) TABS tablet Take 1 tablet by mouth at bedtime.     . propranolol (INDERAL) 20 MG tablet Take 1 tablet (20 mg total) by mouth 2 (two) times daily. 60 tablet 2   No current facility-administered medications on file prior to visit.     Review of Systems  Constitutional: Negative for fever and chills.  Eyes:       Negative for changes in vision  Respiratory: Negative for cough, chest tightness and wheezing.   Cardiovascular: Negative for chest pain, palpitations and leg swelling.  Neurological: Negative for dizziness, weakness and light-headedness.  Psychiatric/Behavioral: Positive for decreased concentration.      Objective:    BP 126/78 mmHg  Pulse 78  Temp(Src) 97.8 F (36.6 C) (  Oral)  Resp 16  Ht  (1.6 m)  Wt 192 lb 6.4 oz (87.272 kg)  BMI 34.09 kg/m2  SpO2 96% Nursing note and vital signs reviewed.  Physical Exam  Constitutional: She is oriented to person, place, and time. She appears well-developed and well-nourished. No distress.  Cardiovascular: Normal rate, regular rhythm, normal heart sounds and intact distal pulses.   Pulmonary/Chest: Effort normal and breath sounds normal.  Neurological: She  is alert and oriented to person, place, and time.  Skin: Skin is warm and dry.  Psychiatric: She has a normal mood and affect. Her behavior is normal. Judgment and thought content normal.       Assessment & Plan:   Problem List Items Addressed This Visit      Other   ADD (attention deficit disorder) - Primary    Attention remains decreased with the current dosage with example of overflowing the sink recently because she got distracted. Increase Adderall with caution. Discussed associated risks of increasing Adderall with her continued breast feeding. She will continue to monitor and discuss with pediatrics at next office visit. No significant effects noted presently.          I have discontinued Ms. Hagadorn's amphetamine-dextroamphetamine and predniSONE. I am also having her start on amphetamine-dextroamphetamine. Additionally, I am having her maintain her prenatal multivitamin, ibuprofen, oxyCODONE-acetaminophen, ferrous sulfate, cholecalciferol, docusate sodium, propranolol, butalbital-acetaminophen-caffeine, ALPRAZolam, and FENUGREEK PO.   Meds ordered this encounter  Medications  . amphetamine-dextroamphetamine (ADDERALL) 10 MG tablet    Sig: Take 1 tablet (10 mg total) by mouth 3 (three) times daily as needed.    Dispense:  90 tablet    Refill:  0    Order Specific Question:  Supervising Provider    Answer:  Hillard Danker A [[id number]]     Follow-up: Return in about 1 month (around 11/25/2015).  Jeanine Luz, FNP

## 2015-10-25 NOTE — Progress Notes (Signed)
Pre visit review using our clinic review tool, if applicable. No additional management support is needed unless otherwise documented below in the visit note. 

## 2015-10-31 MED FILL — PROPRANOLOL 20 MG TABLET: 20 | 30 days supply | Qty: 60 | Fill #1

## 2015-11-03 ENCOUNTER — Telehealth: Payer: Self-pay | Admitting: Neurology

## 2015-11-03 MED ORDER — OXYCODONE-ACETAMINOPHEN 5-325 MG PO TABS: 1.0000 | ORAL_TABLET | Freq: Four times a day (QID) | ORAL | Status: DC | PRN

## 2015-11-03 NOTE — Telephone Encounter (Signed)
Patient requesting refill of oxyCODONE-acetaminophen (PERCOCET/ROXICET) 5-325 MG tablet . Pt has 3 pills left, she is taking 1 daily. She is requesting RX today. Pt will call back at 11. Pt is aware the office closes at 12 and reopens on Tuesday.

## 2015-11-03 NOTE — Telephone Encounter (Signed)
Prescription for oxycodone previously was written by her OB GYN doctor, we will take over treatment of her headache. I'll write the prescription.

## 2015-11-07 ENCOUNTER — Telehealth: Payer: Self-pay | Admitting: *Deleted

## 2015-11-07 DIAGNOSIS — F411 Generalized anxiety disorder: Secondary | ICD-10-CM

## 2015-11-07 MED ORDER — ALPRAZOLAM 0.25 MG PO TABS: 0.2500 mg | ORAL_TABLET | Freq: Every day | ORAL | Status: DC | PRN

## 2015-11-07 MED FILL — ALPRAZolam 0.25 MG TABS: 0.25 | 30 days supply | Qty: 30 | Fill #0

## 2015-11-07 NOTE — Telephone Encounter (Signed)
Rec'd call pt requesting refill on his alprazolam.../lmb

## 2015-11-07 NOTE — Telephone Encounter (Signed)
Rx printed, signed, up front for pick-up. 

## 2015-11-07 NOTE — Telephone Encounter (Signed)
Medication refilled

## 2015-11-07 NOTE — Telephone Encounter (Signed)
Faxed script to [facility]...Raechel Chute/lmb

## 2015-11-08 MED FILL — DEXTROAMP-AMP 10 MG TAB: 10 | 30 days supply | Qty: 90 | Fill #0

## 2015-11-14 ENCOUNTER — Other Ambulatory Visit: Payer: Self-pay | Admitting: Nurse Practitioner

## 2015-11-14 NOTE — Telephone Encounter (Signed)
I spoke to [place], NP. She is not going to refill the pt's fioricet until the pt follows up with her on 11/30/2015. I spoke to pt and advised her of this. Pt verbalized understanding. Pt will follow up with Eber Jonesarolyn, NP on 11/30/2015.

## 2015-11-14 NOTE — Telephone Encounter (Signed)
OK to refill pt's fioricet? It seems pt was not taking the fioricet while breast feeding.

## 2015-11-14 NOTE — Telephone Encounter (Signed)
Patient called to request refill of butalbital-acetaminophen-caffeine (FIORICET, ESGIC) 50-325-40 MG tablet

## 2015-11-20 ENCOUNTER — Ambulatory Visit: Payer: 59 | Admitting: Adult Health

## 2015-11-23 ENCOUNTER — Ambulatory Visit: Payer: 59 | Admitting: Nurse Practitioner

## 2015-11-23 ENCOUNTER — Ambulatory Visit: Payer: 59 | Admitting: Family

## 2015-11-30 ENCOUNTER — Telehealth: Payer: Self-pay | Admitting: *Deleted

## 2015-11-30 ENCOUNTER — Ambulatory Visit (INDEPENDENT_AMBULATORY_CARE_PROVIDER_SITE_OTHER): Payer: 59 | Admitting: Family

## 2015-11-30 ENCOUNTER — Ambulatory Visit (INDEPENDENT_AMBULATORY_CARE_PROVIDER_SITE_OTHER): Payer: 59 | Admitting: Nurse Practitioner

## 2015-11-30 ENCOUNTER — Encounter: Payer: Self-pay | Admitting: Nurse Practitioner

## 2015-11-30 ENCOUNTER — Encounter: Payer: Self-pay | Admitting: Family

## 2015-11-30 VITALS — BP 118/70 | HR 76 | Ht 63.0 in | Wt 188.2 lb

## 2015-11-30 VITALS — BP 130/80 | HR 84 | Temp 98.1°F | Resp 16 | Ht 63.0 in | Wt 188.0 lb

## 2015-11-30 DIAGNOSIS — F909 Attention-deficit hyperactivity disorder, unspecified type: Secondary | ICD-10-CM

## 2015-11-30 DIAGNOSIS — F411 Generalized anxiety disorder: Secondary | ICD-10-CM

## 2015-11-30 DIAGNOSIS — G43111 Migraine with aura, intractable, with status migrainosus: Secondary | ICD-10-CM

## 2015-11-30 DIAGNOSIS — F988 Other specified behavioral and emotional disorders with onset usually occurring in childhood and adolescence: Secondary | ICD-10-CM

## 2015-11-30 MED ORDER — AMPHETAMINE-DEXTROAMPHETAMINE 10 MG PO TABS: 10.0000 mg | ORAL_TABLET | Freq: Three times a day (TID) | ORAL | Status: DC | PRN

## 2015-11-30 MED ORDER — ALPRAZOLAM 0.25 MG PO TABS: 0.2500 mg | ORAL_TABLET | Freq: Every day | ORAL | Status: DC | PRN

## 2015-11-30 MED ORDER — PROPRANOLOL HCL 20 MG PO TABS: 20.0000 mg | ORAL_TABLET | Freq: Two times a day (BID) | ORAL | Status: DC

## 2015-11-30 MED ORDER — BUTALBITAL-APAP-CAFFEINE 50-325-40 MG PO TABS: 1.0000 | ORAL_TABLET | ORAL | Status: DC | PRN

## 2015-11-30 MED FILL — BUTALB-ACETAMIN-CAFF 50-325: 50-325-40 | 5 days supply | Qty: 30 | Fill #0

## 2015-11-30 MED FILL — PROPRANOLOL 20 MG TABLET: 20 | 90 days supply | Qty: 180 | Fill #0

## 2015-11-30 NOTE — Telephone Encounter (Signed)
Received fax confirmation for fioricet ([facility] [phone]).

## 2015-11-30 NOTE — Patient Instructions (Addendum)
Continue propranolol 20 mg twice daily Continue Fioricet acutely Follow-up in 3 months

## 2015-11-30 NOTE — Progress Notes (Signed)
Subjective:    Patient ID: Cynthia Townsend, female    DOB: 07/03/1980, 35 y.o.   MRN: [id number]  Chief Complaint  Patient presents with  . Medication Management    dose is better, refill of adderall    HPI:  Cynthia Townsend is a 35 y.o. female who  has a past medical history of Anxiety; ADD (attention deficit disorder); Sciatic pain; Chicken pox; Depression; Subclinical hypothyroidism; Insomnia; Vitamin D deficiency; Nasal polyps; Ectopic pregnancy; Pregnancy induced hypertension; and Classic migraine with aura (09/20/2015). and presents today for an office visit.   1.) ADHD -Currently maintained on Adderall. Reports taking medication as prescribed and denies adverse side effects. Sleep is currently disturb secondary to a 4420-month-old. Denies any changes to appetite or significant weight changes. No chest pain, shortness of breath, or heart palpitations. Symptoms are improved with current medication regimen. She does continue to breast-feed at this time.  2.) Anxiety - currently maintained on alprazolam and taken as needed. Denies adverse side effects and indicates her symptoms radically control with current medication regimen.  Allergies  Allergen Reactions  . Penicillins Shortness Of Breath, Swelling and Other (See Comments)    Has patient had a PCN reaction causing immediate rash, facial/tongue/throat swelling, SOB or lightheadedness with hypotension:Unknown Has patient had a PCN reaction causing severe rash involving mucus membranes or skin necrosis: unknown Has patient had a PCN reaction that required hospitalization unknown Has patient had a PCN reaction occurring within the last 10 years: uknown If all of the above answers are "NO", then may proceed with Cephalosporin use.     . Sulfa Antibiotics Hives and Rash     Current Outpatient Prescriptions on File Prior to Visit  Medication Sig Dispense Refill  . aspirin-acetaminophen-caffeine (EXCEDRIN MIGRAINE) 250-250-65 MG tablet  Take 1 tablet by mouth as needed for headache.    . cholecalciferol (VITAMIN D) 1000 units tablet Take 5,000 Units by mouth daily.    Marland Kitchen. docusate sodium (STOOL SOFTENER) 100 MG capsule Take 100 mg by mouth 3 (three) times daily.     Marland Kitchen. FENUGREEK PO Take by mouth. 1 cap daily    . norethindrone (MICRONOR,CAMILA,ERRIN) 0.35 MG tablet Reported on 11/30/2015  11  . oxyCODONE-acetaminophen (PERCOCET/ROXICET) 5-325 MG tablet Take 1 tablet by mouth every 6 (six) hours as needed (pain scale 4-7). Must last 28 days 30 tablet 0  . Prenatal Vit-Fe Fumarate-FA (PRENATAL MULTIVITAMIN) TABS tablet Take 1 tablet by mouth at bedtime.     . butalbital-acetaminophen-caffeine (FIORICET, ESGIC) 50-325-40 MG tablet Take 1 tablet by mouth every 4 (four) hours as needed for headache. 30 tablet 1  . propranolol (INDERAL) 20 MG tablet Take 1 tablet (20 mg total) by mouth 2 (two) times daily. 180 tablet 2   No current facility-administered medications on file prior to visit.    Review of Systems  Constitutional: Negative for fever and chills.  Eyes:       Negative for changes in vision  Respiratory: Negative for cough, chest tightness and wheezing.   Cardiovascular: Negative for chest pain, palpitations and leg swelling.  Neurological: Negative for dizziness, weakness and light-headedness.      Objective:    BP 130/80 mmHg  Pulse 84  Temp(Src) 98.1 F (36.7 C) (Oral)  Resp 16  Ht 5\' 3"  (1.6 m)  Wt 188 lb (85.276 kg)  BMI 33.31 kg/m2  SpO2 97% Nursing note and vital signs reviewed.  Physical Exam  Constitutional: She is oriented to person, place, and time. She  appears well-developed and well-nourished. No distress.  Cardiovascular: Normal rate, regular rhythm, normal heart sounds and intact distal pulses.   Pulmonary/Chest: Effort normal and breath sounds normal.  Neurological: She is alert and oriented to person, place, and time.  Skin: Skin is warm and dry.  Psychiatric: She has a normal mood and  affect. Her behavior is normal. Judgment and thought content normal.       Assessment & Plan:   Problem List Items Addressed This Visit      Other   ADD (attention deficit disorder)    ADD appears stable with current regimen and no adverse side effects to patient or her infant. Advised to proceed with caution. Continue current dosage of Adderall. She is sleeping adequately and eating well. No cardiac symptoms. [place] controlled substance database reviewed with no irregularities. Follow-up in 3 months or sooner if needed.      Generalized anxiety disorder - Primary    Generalized anxiety is currently adequately controlled with alprazolam without adverse side effects. Continue current dosage of alprazolam. Reemphasize importance of avoiding benzodiazepines while breast-feeding.      Relevant Medications   ALPRAZolam (XANAX) 0.25 MG tablet       I am having Cynthia Townsend maintain her prenatal multivitamin, cholecalciferol, docusate sodium, FENUGREEK PO, oxyCODONE-acetaminophen, norethindrone, aspirin-acetaminophen-caffeine, amphetamine-dextroamphetamine, and ALPRAZolam.   Meds ordered this encounter  Medications  . DISCONTD: amphetamine-dextroamphetamine (ADDERALL) 10 MG tablet    Sig: Take 1 tablet (10 mg total) by mouth 3 (three) times daily as needed.    Dispense:  90 tablet    Refill:  0    Order Specific Question:  Supervising Provider    Answer:  Hillard DankerRAWFORD, ELIZABETH A [[id number]]  . DISCONTD: amphetamine-dextroamphetamine (ADDERALL) 10 MG tablet    Sig: Take 1 tablet (10 mg total) by mouth 3 (three) times daily as needed.    Dispense:  90 tablet    Refill:  0    Do not fill until 12/30/15    Order Specific Question:  Supervising Provider    Answer:  Hillard DankerRAWFORD, ELIZABETH A [[id number]]  . amphetamine-dextroamphetamine (ADDERALL) 10 MG tablet    Sig: Take 1 tablet (10 mg total) by mouth 3 (three) times daily as needed.    Dispense:  90 tablet    Refill:  0    Do not fill until  01/30/16    Order Specific Question:  Supervising Provider    Answer:  Hillard DankerRAWFORD, ELIZABETH A [[id number]]  . ALPRAZolam (XANAX) 0.25 MG tablet    Sig: Take 1 tablet (0.25 mg total) by mouth daily as needed for anxiety.    Dispense:  30 tablet    Refill:  0    Order Specific Question:  Supervising Provider    Answer:  Hillard DankerRAWFORD, ELIZABETH A [[id number]]     Follow-up: Return in about 3 months (around 03/01/2016), or if symptoms worsen or fail to improve.  Jeanine Luzalone, Gregory, FNP

## 2015-11-30 NOTE — Patient Instructions (Signed)
Thank you for choosing  HealthCare.  Summary/Instructions:  Please continue to take your medications as prescribed.   Your prescription(s) have been submitted to your pharmacy or been printed and provided for you. Please take as directed and contact our office if you believe you are having problem(s) with the medication(s) or have any questions.  If your symptoms worsen or fail to improve, please contact our office for further instruction, or in case of emergency go directly to the emergency room at the closest medical facility.     

## 2015-11-30 NOTE — Progress Notes (Signed)
I have read the note, and I agree with the clinical assessment and plan.  Estefanie Cornforth KEITH   

## 2015-11-30 NOTE — Progress Notes (Signed)
Pre visit review using our clinic review tool, if applicable. No additional management support is needed unless otherwise documented below in the visit note. 

## 2015-11-30 NOTE — Assessment & Plan Note (Signed)
ADD appears stable with current regimen and no adverse side effects to patient or her infant. Advised to proceed with caution. Continue current dosage of Adderall. She is sleeping adequately and eating well. No cardiac symptoms. [place] controlled substance database reviewed with no irregularities. Follow-up in 3 months or sooner if needed.

## 2015-11-30 NOTE — Progress Notes (Signed)
[facility]  PATIENT: Cynthia Townsend DOB: 06-Dec-1980   REASON FOR VISIT: Follow-up for  migraine HISTORY FROM: Patient    HISTORY OF PRESENT ILLNESS:UPDATE 06/22/2017CM Cynthia Townsend, 35 year old female returns for follow-up of her migraine headaches. She has kept a record and she has had 9 headaches in the last month. 90% of these occurred on weekdays while at work. She claims they are less severe and she is able to work without missing days. She is currently taking Inderal 20 mg twice daily and Fioricet acutely. She continues to breast-feed and will for another 3 months. Her headache symptoms have not changed. MRI of the brain 10/19/15  Shows 16 mm arachnoid cyst right lower cerebellar pontine angle cistern. This is most likely an incidental finding. Otherwise normal. She returns for reevaluation  UPDATE 5/5 17[name], 35 year old female returns for follow-up. She was evaluated for common migraine by Dr. Anne Hahn 09/20/2015. Her usual migraine is associated with a semicircle area of visual distortion on the right side when she can have some right sparkly lights surrounded by triangle. She will then get a bitemporal headache. She can have nausea, she denies any vomiting. She has some photophobia and occasionally phonophobia with the headache. She denies any neck stiffness. She is also complaining with some neuralgia type sensations that are brief and may last 1 or 2 minutes but occur several times a day and her piercing in nature. This occurs over various areas of his head and can have a throbbing stabbing character. When last seen she was placed on Inderal 20 mg twice daily and given a prednisone Dosepak which she felt was beneficial. In addition she has recently been placed back on birth control, however she continues to breast-feed and  will for the next several weeks. She returns to work the end of the month. She feels her headaches are no better or worse. She denies any  numbness or weakness of the face arms or legs.. She has not had an MRI of the brain and she is questioning this. She returns for reevaluation  09/20/15 [name] is a 35 year old right-handed white female with a history of migraine headaches in the past. Her usual migraines are associated with a semicircle area of visual distortion on the right side surrounded by triangle shaped bright sparkly lights. The patient will then get a bitemporal headache occasionally associated with some nausea. The patient has had very few headaches throughout her life, but she delivered a child on 09/01/2015 and had postpartum preeclampsia within 3 or 4 days afterwards. The patient developed pulmonary edema with this. The patient began having headaches that were similar to her usual migraine with visual disturbances, and bitemporal headache. The patient will have some nausea that may come and go. The headaches have been daily since that time, the patient has been given oxycodone and Fioricet to take for the headache. The patient denies any numbness or weakness of the face, arms, or legs, she denies any confusion. She has photophobia and phonophobia with the headache. She may have some slight neck stiffness at times. She denies a significant family history of migraine. She has had some anemia following delivery, currently on iron supplementation. The patient is sent to this office for an evaluation.  REVIEW OF SYSTEMS: Full 14 system review of systems performed and notable only for those listed, all others are neg:  Constitutional: Fatigue Cardiovascular: neg Ear/Nose/Throat: neg  Skin: neg Eyes: neg Respiratory: neg Gastroitestinal: neg  Hematology/Lymphatic: neg  Endocrine: neg  Musculoskeletal:neg Allergy/Immunology: neg Neurological: Headache Psychiatric: neg Sleep : neg   ALLERGIES: Allergies  Allergen Reactions  . Penicillins Shortness Of Breath, Swelling and Other (See Comments)    Has patient had a PCN  reaction causing immediate rash, facial/tongue/throat swelling, SOB or lightheadedness with hypotension:Unknown Has patient had a PCN reaction causing severe rash involving mucus membranes or skin necrosis: unknown Has patient had a PCN reaction that required hospitalization unknown Has patient had a PCN reaction occurring within the last 10 years: uknown If all of the above answers are "NO", then may proceed with Cephalosporin use.     . Sulfa Antibiotics Hives and Rash    HOME MEDICATIONS: Outpatient Prescriptions Prior to Visit  Medication Sig Dispense Refill  . ALPRAZolam (XANAX) 0.25 MG tablet Take 1 tablet (0.25 mg total) by mouth daily as needed for anxiety. 30 tablet 0  . amphetamine-dextroamphetamine (ADDERALL) 10 MG tablet Take 1 tablet (10 mg total) by mouth 3 (three) times daily as needed. 90 tablet 0  . butalbital-acetaminophen-caffeine (FIORICET, ESGIC) 50-325-40 MG tablet Take 1 tablet by mouth every 4 (four) hours as needed for headache. 30 tablet 1  . cholecalciferol (VITAMIN D) 1000 units tablet Take 5,000 Units by mouth daily.    Marland Kitchen. docusate sodium (STOOL SOFTENER) 100 MG capsule Take 100 mg by mouth 3 (three) times daily.     Marland Kitchen. FENUGREEK PO Take by mouth. 1 cap daily    . oxyCODONE-acetaminophen (PERCOCET/ROXICET) 5-325 MG tablet Take 1 tablet by mouth every 6 (six) hours as needed (pain scale 4-7). Must last 28 days 30 tablet 0  . Prenatal Vit-Fe Fumarate-FA (PRENATAL MULTIVITAMIN) TABS tablet Take 1 tablet by mouth at bedtime.     . propranolol (INDERAL) 20 MG tablet Take 1 tablet (20 mg total) by mouth 2 (two) times daily. 60 tablet 2  . ferrous sulfate (FERROUSUL) 325 (65 FE) MG tablet Take 1 tablet (325 mg total) by mouth 2 (two) times daily with a meal. (Patient taking differently: Take 325 mg by mouth daily with breakfast. ) 60 tablet 3  . ibuprofen (ADVIL,MOTRIN) 600 MG tablet Take 1 tablet (600 mg total) by mouth every 6 (six) hours as needed. (Patient taking  differently: Take 600 mg by mouth every 6 (six) hours as needed for moderate pain or cramping. ) 30 tablet 0   No facility-administered medications prior to visit.    PAST MEDICAL HISTORY: Past Medical History  Diagnosis Date  . Anxiety   . ADD (attention deficit disorder)   . Sciatic pain   . Chicken pox   . Depression   . Subclinical hypothyroidism   . Insomnia   . Vitamin D deficiency   . Nasal polyps   . Ectopic pregnancy   . Pregnancy induced hypertension   . Classic migraine with aura 09/20/2015    PAST SURGICAL HISTORY: Past Surgical History  Procedure Laterality Date  . Breast biopsy      benign  . Dilation and evacuation N/A 09/04/2015    Procedure: DILATATION AND EVACUATION;  Surgeon: Mitchel HonourMegan Morris, DO;  Location: [facility];  Service: Gynecology;  Laterality: N/A;    FAMILY HISTORY: Family History  Problem Relation Age of Onset  . Alcohol abuse Mother   . Lung cancer Mother   . Hyperlipidemia Mother   . Heart disease Mother   . Mental illness Mother   . Hyperthyroidism Mother   . COPD Mother   . Hyperlipidemia Father   . Heart disease Father   .  Transient ischemic attack Father   . Pulmonary embolism Father   . Heart disease Sister   . Mental illness Sister   . Heart disease Maternal Uncle   . Rheum arthritis Maternal Grandmother     SOCIAL HISTORY: Social History   Social History  . Marital Status: Single    Spouse Name: N/A  . Number of Children: 0  . Years of Education: 16   Occupational History  . 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 Smoking status: Former Smoker    Types: Cigarettes  . Smokeless tobacco: Never Used  . Alcohol Use: No  . Drug Use: No  . Sexual Activity: Yes    Birth Control/ Protection: None   Other Topics Concern  . Not on file   Social History Narrative   Lives at home w/ her fiance and new daughter.   Right-handed.   Drinks about 1-2 cups of caffeine per day.   Fun: Hiking, Zumba, sleep   Denies  religious beliefs effecting health care.   Denies abuse and feels safe at home.      PHYSICAL EXAM  Filed Vitals:   11/30/15 1025  BP: 118/70  Pulse: 76  Height: 5\' 3"  (1.6 m)  Weight: 188 lb 3.2 oz (85.367 kg)   Body mass index is 33.35 kg/(m^2). Generalized: Well developed, moderately obese female in no acute distress  Head: normocephalic and atraumatic,. Oropharynx benign  Neck: Supple, no carotid bruits  Cardiac: Regular rate rhythm, no murmur  Musculoskeletal: Crepitus is noted in the right greater than left TMJ opening and closure  Neurological examination   Mentation: Alert oriented to time, place, history taking. Attention span and concentration appropriate. Recent and remote memory intact. Follows all commands speech and language fluent.   Cranial nerve II-XII: Fundoscopic exam reveals sharp disc margins.Pupils were equal round reactive to light extraocular movements were full, visual field were full on confrontational test. Facial sensation and strength were normal. hearing was intact to finger rubbing bilaterally. Uvula tongue midline. head turning and shoulder shrug were normal and symmetric.Tongue protrusion into cheek strength was normal. Motor: normal bulk and tone, full strength in the BUE, BLE, fine finger movements normal, no pronator drift. No focal weakness Sensory: normal and symmetric to light touch, pinprick, and Vibration, proprioception . No evidence of extinction Coordination: finger-nose-finger, heel-to-shin bilaterally, no dysmetria Reflexes: Brachioradialis 2/2, biceps 2/2, triceps 2/2, patellar 2/2, Achilles 2/2, plantar responses were flexor bilaterally. Gait and Station: Rising up from seated position without assistance, normal stance, moderate stride, good arm swing, smooth turning, able to perform tiptoe, and heel walking without difficulty. Tandem gait is steady  DIAGNOSTIC DATA (LABS, IMAGING, TESTING) - I reviewed patient records, labs,  notes, testing and imaging myself where available.  Lab Results  Component Value Date   WBC 9.6 09/06/2015   HGB 8.0* 09/06/2015   HCT 25.0* 09/06/2015   MCV 87.7 09/06/2015   PLT 270 09/06/2015      Component Value Date/Time   NA 140 09/06/2015 0553   K 3.6 09/06/2015 0553   CL 106 09/06/2015 0553   CO2 25 09/06/2015 0553   GLUCOSE 82 09/06/2015 0553   BUN 12 09/06/2015 0553   CREATININE 0.47 09/06/2015 0553   CALCIUM 7.6* 09/06/2015 0553   PROT 5.6* 09/06/2015 0553   ALBUMIN 2.6* 09/06/2015 0553   AST 28 09/06/2015 0553   ALT 26 09/06/2015 0553   ALKPHOS 119 09/06/2015 0553   BILITOT  0.4 09/06/2015 0553   GFRNONAA >60 09/06/2015 0553   GFRAA >60 09/06/2015 0553    ASSESSMENT AND PLAN 35 y.o. year old female has a past medical history of Anxiety; ADD (attention deficit disorder); and Classic migraine with aura (09/20/2015). and postpartum preeclampsia here to follow-up. She is continuing with  Headache which are less severe at present, she is continuing to breast-feed and plans to  for three  more months. She is back on her birth-control pills. MRI of the brain 10/19/15  Shows 16 mm arachnoid cyst right lower cerebellar pontine angle cistern. This is most likely an incidental finding. Otherwise normal.  PLAN: Continue propranolol 20 mg twice daily Continue Fioricet acutely Follow-up in 3 months at which time we will discuss triptans Nilda Riggs, [facility], [facility], APRN  [facility] [address] [place], [place] [address] [phone]

## 2015-11-30 NOTE — Assessment & Plan Note (Signed)
Generalized anxiety is currently adequately controlled with alprazolam without adverse side effects. Continue current dosage of alprazolam. Reemphasize importance of avoiding benzodiazepines while breast-feeding.

## 2015-12-14 MED FILL — ALPRAZolam 0.25 MG TABS: 0.25 | 30 days supply | Qty: 30 | Fill #0

## 2015-12-14 MED FILL — DEXTROAMP-AMP 10 MG TAB: 10 | 30 days supply | Qty: 90 | Fill #0

## 2016-01-02 DIAGNOSIS — G5601 Carpal tunnel syndrome, right upper limb: Secondary | ICD-10-CM | POA: Diagnosis not present

## 2016-01-02 DIAGNOSIS — M654 Radial styloid tenosynovitis [de Quervain]: Secondary | ICD-10-CM | POA: Diagnosis not present

## 2016-01-22 MED FILL — DEXTROAMP-AMP 10 MG TAB: 10 | 30 days supply | Qty: 90 | Fill #0

## 2016-01-25 DIAGNOSIS — M654 Radial styloid tenosynovitis [de Quervain]: Secondary | ICD-10-CM | POA: Diagnosis not present

## 2016-01-25 DIAGNOSIS — G5601 Carpal tunnel syndrome, right upper limb: Secondary | ICD-10-CM | POA: Diagnosis not present

## 2016-01-30 ENCOUNTER — Telehealth: Payer: Self-pay | Admitting: *Deleted

## 2016-01-30 DIAGNOSIS — F411 Generalized anxiety disorder: Secondary | ICD-10-CM

## 2016-01-30 NOTE — Telephone Encounter (Signed)
Rec'd call pt requesting refill on her Alprazolam.../lmb 

## 2016-01-31 MED ORDER — ALPRAZOLAM 0.25 MG PO TABS: 0.2500 mg | ORAL_TABLET | Freq: Every day | ORAL | 0 refills | Status: DC | PRN

## 2016-01-31 MED FILL — ALPRAZolam 0.25 MG TABS: 0.25 | 30 days supply | Qty: 30 | Fill #0

## 2016-01-31 NOTE — Telephone Encounter (Signed)
Ok to refill with previous signature. 

## 2016-01-31 NOTE — Telephone Encounter (Signed)
[facility]/Heidi gave Tammy SoursGreg approval.../lmb

## 2016-02-02 MED FILL — CLINDAMYCIN HCL 300 MG CAPS: 300 | 5 days supply | Qty: 15 | Fill #0

## 2016-02-02 MED FILL — IBUPROFEN 800 MG TABLET: 800 | 4 days supply | Qty: 12 | Fill #0

## 2016-02-06 MED FILL — GABAPENTIN 300 MG CAPSULE: 300 | 7 days supply | Qty: 14 | Fill #0

## 2016-02-19 MED FILL — DEXTROAMP-AMP 10 MG TAB: 10 | 30 days supply | Qty: 90 | Fill #0

## 2016-02-29 ENCOUNTER — Ambulatory Visit (INDEPENDENT_AMBULATORY_CARE_PROVIDER_SITE_OTHER): Payer: 59 | Admitting: Family

## 2016-02-29 ENCOUNTER — Ambulatory Visit (INDEPENDENT_AMBULATORY_CARE_PROVIDER_SITE_OTHER): Payer: 59 | Admitting: Nurse Practitioner

## 2016-02-29 ENCOUNTER — Telehealth: Payer: Self-pay | Admitting: *Deleted

## 2016-02-29 ENCOUNTER — Encounter: Payer: Self-pay | Admitting: Family

## 2016-02-29 ENCOUNTER — Encounter: Payer: Self-pay | Admitting: Nurse Practitioner

## 2016-02-29 VITALS — BP 124/86 | HR 81 | Temp 98.2°F | Resp 16 | Ht 63.0 in | Wt 186.0 lb

## 2016-02-29 VITALS — BP 130/80 | HR 83 | Ht 63.0 in | Wt 186.0 lb

## 2016-02-29 DIAGNOSIS — F909 Attention-deficit hyperactivity disorder, unspecified type: Secondary | ICD-10-CM

## 2016-02-29 DIAGNOSIS — G479 Sleep disorder, unspecified: Secondary | ICD-10-CM

## 2016-02-29 DIAGNOSIS — G43101 Migraine with aura, not intractable, with status migrainosus: Secondary | ICD-10-CM

## 2016-02-29 DIAGNOSIS — F411 Generalized anxiety disorder: Secondary | ICD-10-CM | POA: Diagnosis not present

## 2016-02-29 DIAGNOSIS — F988 Other specified behavioral and emotional disorders with onset usually occurring in childhood and adolescence: Secondary | ICD-10-CM

## 2016-02-29 MED ORDER — RIZATRIPTAN BENZOATE 10 MG PO TBDP: 10.0000 mg | ORAL_TABLET | ORAL | 6 refills | Status: DC | PRN

## 2016-02-29 MED ORDER — AMPHETAMINE-DEXTROAMPHET ER 30 MG PO CP24: 30.0000 mg | ORAL_CAPSULE | Freq: Every day | ORAL | 0 refills | Status: DC

## 2016-02-29 MED ORDER — AMPHETAMINE-DEXTROAMPHETAMINE 5 MG PO TABS: 5.0000 mg | ORAL_TABLET | Freq: Every day | ORAL | 0 refills | Status: DC | PRN

## 2016-02-29 MED ORDER — ALPRAZOLAM 0.25 MG PO TABS: 0.2500 mg | ORAL_TABLET | Freq: Every day | ORAL | 1 refills | Status: DC | PRN

## 2016-02-29 MED ORDER — FLUCONAZOLE 150 MG PO TABS: 150.0000 mg | ORAL_TABLET | Freq: Once | ORAL | 0 refills | Status: AC

## 2016-02-29 MED ORDER — AMITRIPTYLINE HCL 25 MG PO TABS: 25.0000 mg | ORAL_TABLET | Freq: Every day | ORAL | 0 refills | Status: DC

## 2016-02-29 MED FILL — AMITRIPTYLINE HCL 25 MG TAB: 25 | 30 days supply | Qty: 30 | Fill #0

## 2016-02-29 MED FILL — RIZATRIPTAN 10 MG ODT: 10 | 30 days supply | Qty: 9 | Fill #0

## 2016-02-29 MED FILL — FLUCONAZOLE 150 MG TABLET: 150 | 1 days supply | Qty: 1 | Fill #0

## 2016-02-29 NOTE — Patient Instructions (Addendum)
Thank you for choosing [facility].  SUMMARY AND INSTRUCTIONS:  Medication:  Your prescription(s) have been submitted to your pharmacy or been printed and provided for you. Please take as directed and contact our office if you believe you are having problem(s) with the medication(s) or have any questions.  Follow up:  If your symptoms worsen or fail to improve, please contact our office for further instruction, or in case of emergency go directly to the emergency room at the closest medical facility.     

## 2016-02-29 NOTE — Telephone Encounter (Signed)
Medication printed to be faxed.  

## 2016-02-29 NOTE — Telephone Encounter (Signed)
rec'd call p[t states she just saw Tammy SoursGreg forgot to ask for refill on her alprazolam. [facility]...Raechel Chute/lmb

## 2016-02-29 NOTE — Assessment & Plan Note (Signed)
Symptoms of sleep disturbance previously managed on trazodone. Not currently taking medications at this time. Start amitriptyline. If unsuccessful consider restarting trazodone. Encouraged good sleep hygiene. Follow-up pending trial of medications.

## 2016-02-29 NOTE — Progress Notes (Signed)
Today H Sheryl still had that Trileptal level back present and normal THANK YOU  [facility]  PATIENT: Cynthia Townsend DOB: 1980-08-22   REASON FOR VISIT: Follow-up for  migraine HISTORY FROM: Patient    HISTORY OF PRESENT ILLNESS:UPDATE 09/21/2017CM Cynthia Townsend, 35 year old female returns for follow-up. She has a history of migraine headaches and is currently on propanolol 20 mg twice daily. Her headaches are better however she still may get a migraine with weather changes and during her menstrual cycle. She has now stopped breast-feeding and is ready to have an acute medication. She is having 1 headache per week sometimes two. She has not missed any work due to her headaches. She also notices that sleep deprivation causes her headaches. She returns for reevaluation   UPDATE 06/22/2017CM Cynthia Townsend, 35 year old female returns for follow-up of her migraine headaches. She has kept a record and she has had 9 headaches in the last month. 90% of these occurred on weekdays while at work. She claims they are less severe and she is able to work without missing days. She is currently taking Inderal 20 mg twice daily and Fioricet acutely. She continues to breast-feed and will for another 3 months. Her headache symptoms have not changed. MRI of the brain 10/19/15  Shows 16 mm arachnoid cyst right lower cerebellar pontine angle cistern. This is most likely an incidental finding. Otherwise normal. She returns for reevaluation  UPDATE 5/5 17CMMs. Townsend, 35 year old female returns for follow-up. She was evaluated for common migraine by Dr. Anne Hahn 09/20/2015. Her usual migraine is associated with a semicircle area of visual distortion on the right side when she can have some right sparkly lights surrounded by triangle. She will then get a bitemporal headache. She can have nausea, she denies any vomiting. She has some photophobia and occasionally phonophobia with the headache. She denies any  neck stiffness. She is also complaining with some neuralgia type sensations that are brief and may last 1 or 2 minutes but occur several times a day and her piercing in nature. This occurs over various areas of his head and can have a throbbing stabbing character. When last seen she was placed on Inderal 20 mg twice daily and given a prednisone Dosepak which she felt was beneficial. In addition she has recently been placed back on birth control, however she continues to breast-feed and  will for the next several weeks. She returns to work the end of the month. She feels her headaches are no better or worse. She denies any numbness or weakness of the face arms or legs.. She has not had an MRI of the brain and she is questioning this. She returns for reevaluation  09/20/15 [name] is a 35 year old right-handed white female with a history of migraine headaches in the past. Her usual migraines are associated with a semicircle area of visual distortion on the right side surrounded by triangle shaped bright sparkly lights. The patient will then get a bitemporal headache occasionally associated with some nausea. The patient has had very few headaches throughout her life, but she delivered a child on 09/01/2015 and had postpartum preeclampsia within 3 or 4 days afterwards. The patient developed pulmonary edema with this. The patient began having headaches that were similar to her usual migraine with visual disturbances, and bitemporal headache. The patient will have some nausea that may come and go. The headaches have been daily since that time, the patient has been given oxycodone and Fioricet to take for the headache. The  patient denies any numbness or weakness of the face, arms, or legs, she denies any confusion. She has photophobia and phonophobia with the headache. She may have some slight neck stiffness at times. She denies a significant family history of migraine. She has had some anemia following delivery,  currently on iron supplementation. The patient is sent to this office for an evaluation.  REVIEW OF SYSTEMS: Full 14 system review of systems performed and notable only for those listed, all others are neg:  Constitutional: Fatigue Cardiovascular: neg Ear/Nose/Throat: neg  Skin: neg Eyes: Light sensitivity Respiratory: neg Gastroitestinal: neg  Hematology/Lymphatic: neg  Endocrine: neg Musculoskeletal:neg Allergy/Immunology: neg Neurological: Headache Psychiatric: Anxiety Sleep : Insomnia   ALLERGIES: Allergies  Allergen Reactions  . Penicillins Shortness Of Breath, Swelling and Other (See Comments)    Has patient had a PCN reaction causing immediate rash, facial/tongue/throat swelling, SOB or lightheadedness with hypotension:Unknown Has patient had a PCN reaction causing severe rash involving mucus membranes or skin necrosis: unknown Has patient had a PCN reaction that required hospitalization unknown Has patient had a PCN reaction occurring within the last 10 years: uknown If all of the above answers are "NO", then may proceed with Cephalosporin use.     . Sulfa Antibiotics Hives and Rash    HOME MEDICATIONS: Outpatient Medications Prior to Visit  Medication Sig Dispense Refill  . ALPRAZolam (XANAX) 0.25 MG tablet Take 1 tablet (0.25 mg total) by mouth daily as needed for anxiety. 30 tablet 0  . amphetamine-dextroamphetamine (ADDERALL) 10 MG tablet Take 1 tablet (10 mg total) by mouth 3 (three) times daily as needed. 90 tablet 0  . aspirin-acetaminophen-caffeine (EXCEDRIN MIGRAINE) 250-250-65 MG tablet Take 1 tablet by mouth as needed for headache.    . butalbital-acetaminophen-caffeine (FIORICET, ESGIC) 50-325-40 MG tablet Take 1 tablet by mouth every 4 (four) hours as needed for headache. 30 tablet 1  . docusate sodium (STOOL SOFTENER) 100 MG capsule Take 100 mg by mouth 3 (three) times daily.     . norethindrone (MICRONOR,CAMILA,ERRIN) 0.35 MG tablet Reported on  11/30/2015  11  . propranolol (INDERAL) 20 MG tablet Take 1 tablet (20 mg total) by mouth 2 (two) times daily. 180 tablet 2  . cholecalciferol (VITAMIN D) 1000 units tablet Take 5,000 Units by mouth daily.    Marland Kitchen FENUGREEK PO Take by mouth. 1 cap daily    . oxyCODONE-acetaminophen (PERCOCET/ROXICET) 5-325 MG tablet Take 1 tablet by mouth every 6 (six) hours as needed (pain scale 4-7). Must last 28 days (Patient not taking: Reported on 02/29/2016) 30 tablet 0  . Prenatal Vit-Fe Fumarate-FA (PRENATAL MULTIVITAMIN) TABS tablet Take 1 tablet by mouth at bedtime.      No facility-administered medications prior to visit.     PAST MEDICAL HISTORY: Past Medical History:  Diagnosis Date  . ADD (attention deficit disorder)   . Anxiety   . Chicken pox   . Classic migraine with aura 09/20/2015  . Depression   . Ectopic pregnancy   . Insomnia   . Nasal polyps   . Pregnancy induced hypertension   . Sciatic pain   . Subclinical hypothyroidism   . Vitamin D deficiency     PAST SURGICAL HISTORY: Past Surgical History:  Procedure Laterality Date  . BREAST BIOPSY     benign  . DILATION AND EVACUATION N/A 09/04/2015   Procedure: DILATATION AND EVACUATION;  Surgeon: Mitchel Honour, DO;  Location: [facility];  Service: Gynecology;  Laterality: N/A;    FAMILY HISTORY: Family History  Problem Relation Age of Onset  . Alcohol abuse Mother   . Lung cancer Mother   . Hyperlipidemia Mother   . Heart disease Mother   . Mental illness Mother   . Hyperthyroidism Mother   . COPD Mother   . Hyperlipidemia Father   . Heart disease Father   . Transient ischemic attack Father   . Pulmonary embolism Father   . Heart disease Sister   . Mental illness Sister   . Heart disease Maternal Uncle   . Rheum arthritis Maternal Grandmother     SOCIAL HISTORY: Social History   Social History  . Marital status: Single    Spouse name: N/A  . Number of children: 0  . Years of education: 25   Occupational History    . 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 Smoking status: Former Smoker    Types: Cigarettes  . Smokeless tobacco: Never Used  . Alcohol use No  . Drug use: No  . Sexual activity: Yes    Birth control/ protection: None   Other Topics Concern  . Not on file   Social History Narrative   Lives at home w/ her fiance and new daughter.   Right-handed.   Drinks about 1-2 cups of caffeine per day.   Fun: Hiking, Zumba, sleep   Denies religious beliefs effecting health care.   Denies abuse and feels safe at home.      PHYSICAL EXAM  Vitals:   02/29/16 0832  BP: 130/80  Pulse: 83  Weight: 186 lb (84.4 kg)  Height: 5\' 3"  (1.6 m)   Body mass index is 32.95 kg/m. Generalized: Well developed, moderately obese female in no acute distress  Head: normocephalic and atraumatic,. Oropharynx benign  Neck: Supple, no carotid bruits  Cardiac: Regular rate rhythm, no murmur  Neurological examination   Mentation: Alert oriented to time, place, history taking. Attention span and concentration appropriate. Recent and remote memory intact. Follows all commands speech and language fluent.   Cranial nerve II-XII: .Pupils were equal round reactive to light extraocular movements were full, visual field were full on confrontational test. Facial sensation and strength were normal. hearing was intact to finger rubbing bilaterally. Uvula tongue midline. head turning and shoulder shrug were normal and symmetric.Tongue protrusion into cheek strength was normal. Motor: normal bulk and tone, full strength in the BUE, BLE, fine finger movements normal, no pronator drift. No focal weakness Sensory: normal and symmetric to light touch, pinprick, and Vibration, in the upper and lower extremities Coordination: finger-nose-finger, heel-to-shin bilaterally, no dysmetria Reflexes: Brachioradialis 2/2, biceps 2/2, triceps 2/2, patellar 2/2, Achilles 2/2, plantar responses were flexor  bilaterally. Gait and Station: Rising up from seated position without assistance, normal stance, moderate stride, good arm swing, smooth turning, able to perform tiptoe, and heel walking without difficulty. Tandem gait is steady  DIAGNOSTIC DATA (LABS, IMAGING, TESTING) - I reviewed patient records, labs, notes, testing and imaging myself where available.  Lab Results  Component Value Date   WBC 9.6 09/06/2015   HGB 8.0 (L) 09/06/2015   HCT 25.0 (L) 09/06/2015   MCV 87.7 09/06/2015   PLT 270 09/06/2015      Component Value Date/Time   NA 140 09/06/2015 0553   K 3.6 09/06/2015 0553   CL 106 09/06/2015 0553   CO2 25 09/06/2015 0553   GLUCOSE 82 09/06/2015 0553   BUN 12 09/06/2015 0553   CREATININE 0.47 09/06/2015 0553  CALCIUM 7.6 (L) 09/06/2015 0553   PROT 5.6 (L) 09/06/2015 0553   ALBUMIN 2.6 (L) 09/06/2015 0553   AST 28 09/06/2015 0553   ALT 26 09/06/2015 0553   ALKPHOS 119 09/06/2015 0553   BILITOT 0.4 09/06/2015 0553   GFRNONAA >60 09/06/2015 0553   GFRAA >60 09/06/2015 0553    ASSESSMENT AND PLAN 35 y.o. year old female has a past medical history of Anxiety; ADD (attention deficit disorder); and Classic migraine with aura (09/20/2015). and postpartum preeclampsia here to follow-up. She is continuing with  Headache which are less severe at present,. She has stopped breast-feeding  MRI of the brain 10/19/15  Shows 16 mm arachnoid cyst right lower cerebellar pontine angle cistern. This is most likely an incidental finding. Otherwise normal.  PLAN: Continue propranolol 20 mg twice daily Continue Fioricet prn Try Maxalt MLT 10mg  prn acute headache Follow-up in 6 months Call for increase in headaches Nilda RiggsNancy Carolyn Martin, [facility] CenterGNP, [facility], APRN  [facility] [address], [address] [place], [place] [address] [phone]

## 2016-02-29 NOTE — Progress Notes (Signed)
I have read the note, and I agree with the clinical assessment and plan.  Abdiaziz Klahn KEITH   

## 2016-02-29 NOTE — Assessment & Plan Note (Signed)
ADD appears labile with current medication regimen and would like to increase. Does have some difficulty falling asleep which has not medication related. No chest pain or other cardiac symptoms. No significant weight loss or appetite changes. [place] controlled substance database reviewed with no irregularities. Change Adderall to Adderall XR and Adderall as needed. Follow-up in one month or sooner.

## 2016-02-29 NOTE — Patient Instructions (Signed)
Continue propranolol 20 mg twice daily Continue Fioricet acutely Try Maxalt MLT 10mg  prn acute headache Follow-up in 6 months Call for increase in headaches

## 2016-02-29 NOTE — Progress Notes (Signed)
Subjective:    Patient ID: Cynthia Townsend, female    DOB: 06-03-1981, 35 y.o.   MRN: [id number]  Chief Complaint  Patient presents with  . Follow-up    Wants to talk about increasing adderall a little, wants to talk about getting back trazodone    HPI:  Cynthia Townsend is a 35 y.o. female who  has a past medical history of ADD (attention deficit disorder); Anxiety; Chicken pox; Classic migraine with aura (09/20/2015); Depression; Ectopic pregnancy; Insomnia; Nasal polyps; Pregnancy induced hypertension; Sciatic pain; Subclinical hypothyroidism; and Vitamin D deficiency. and presents today For a follow-up office visit.  1.) ADD - currently maintained on Adderall. Reports taking the medication as prescribed and denies adverse side effects. Does note some difficulty swallowing asleep at night. Weight and appetite with no significant changes. No chest pain, shortness of breath, or heart palpitations. Notes her symptoms are not adequately controlled with current regimen and would like to gradually return to previous dosage. She has completed breast feeding.   2.) Sleep disturbance - continues to experience associated symptom of inability to fall asleep. Previously maintained on Lunesta which did not have significant effects. She has been on trazodone which has had positive effects.    Allergies  Allergen Reactions  . Penicillins Shortness Of Breath, Swelling and Other (See Comments)    Has patient had a PCN reaction causing immediate rash, facial/tongue/throat swelling, SOB or lightheadedness with hypotension:Unknown Has patient had a PCN reaction causing severe rash involving mucus membranes or skin necrosis: unknown Has patient had a PCN reaction that required hospitalization unknown Has patient had a PCN reaction occurring within the last 10 years: uknown If all of the above answers are "NO", then may proceed with Cephalosporin use.     . Sulfa Antibiotics Hives and Rash       Outpatient Medications Prior to Visit  Medication Sig Dispense Refill  . butalbital-acetaminophen-caffeine (FIORICET, ESGIC) 50-325-40 MG tablet Take 1 tablet by mouth every 4 (four) hours as needed for headache. 30 tablet 1  . docusate sodium (STOOL SOFTENER) 100 MG capsule Take 100 mg by mouth 3 (three) times daily.     . norethindrone (MICRONOR,CAMILA,ERRIN) 0.35 MG tablet Reported on 11/30/2015  11  . propranolol (INDERAL) 20 MG tablet Take 1 tablet (20 mg total) by mouth 2 (two) times daily. 180 tablet 2  . rizatriptan (MAXALT-MLT) 10 MG disintegrating tablet Take 1 tablet (10 mg total) by mouth as needed for migraine. May repeat in 2 hours if needed 10 tablet 6  . ALPRAZolam (XANAX) 0.25 MG tablet Take 1 tablet (0.25 mg total) by mouth daily as needed for anxiety. 30 tablet 0  . amphetamine-dextroamphetamine (ADDERALL) 10 MG tablet Take 1 tablet (10 mg total) by mouth 3 (three) times daily as needed. 90 tablet 0   No facility-administered medications prior to visit.       Past Surgical History:  Procedure Laterality Date  . BREAST BIOPSY     benign  . DILATION AND EVACUATION N/A 09/04/2015   Procedure: DILATATION AND EVACUATION;  Surgeon: Mitchel Honour, DO;  Location: [facility];  Service: Gynecology;  Laterality: N/A;      Past Medical History:  Diagnosis Date  . ADD (attention deficit disorder)   . Anxiety   . Chicken pox   . Classic migraine with aura 09/20/2015  . Depression   . Ectopic pregnancy   . Insomnia   . Nasal polyps   . Pregnancy induced hypertension   . Sciatic  pain   . Subclinical hypothyroidism   . Vitamin D deficiency       Review of Systems  Constitutional: Negative for chills and fever.  Respiratory: Negative for chest tightness and shortness of breath.   Cardiovascular: Negative for chest pain, palpitations and leg swelling.  Neurological: Negative for weakness and headaches.      Objective:    BP 124/86 (BP Location: Left Arm, Patient  Position: Sitting, Cuff Size: Normal)   Pulse 81   Temp 98.2 F (36.8 C) (Oral)   Resp 16   Ht 5\' 3"  (1.6 m)   Wt 186 lb (84.4 kg)   SpO2 98%   BMI 32.95 kg/m  Nursing note and vital signs reviewed.  Physical Exam  Constitutional: She is oriented to person, place, and time. She appears well-developed and well-nourished. No distress.  Cardiovascular: Normal rate, regular rhythm, normal heart sounds and intact distal pulses.   Pulmonary/Chest: Effort normal and breath sounds normal.  Neurological: She is alert and oriented to person, place, and time.  Skin: Skin is warm and dry.  Psychiatric: She has a normal mood and affect. Her behavior is normal. Judgment and thought content normal.       Assessment & Plan:   Problem List Items Addressed This Visit      Other   ADD (attention deficit disorder) - Primary    ADD appears labile with current medication regimen and would like to increase. Does have some difficulty falling asleep which has not medication related. No chest pain or other cardiac symptoms. No significant weight loss or appetite changes. [place] controlled substance database reviewed with no irregularities. Change Adderall to Adderall XR and Adderall as needed. Follow-up in one month or sooner.      Relevant Medications   amphetamine-dextroamphetamine (ADDERALL) 5 MG tablet   amphetamine-dextroamphetamine (ADDERALL XR) 30 MG 24 hr capsule   Generalized anxiety disorder   Relevant Medications   ALPRAZolam (XANAX) 0.25 MG tablet   Sleep disturbance    Symptoms of sleep disturbance previously managed on trazodone. Not currently taking medications at this time. Start amitriptyline. If unsuccessful consider restarting trazodone. Encouraged good sleep hygiene. Follow-up pending trial of medications.      Relevant Medications   amitriptyline (ELAVIL) 25 MG tablet    Other Visit Diagnoses   None.      I am having Ms. Freeland start on amitriptyline and  fluconazole. I am also having her maintain her docusate sodium, norethindrone, propranolol, butalbital-acetaminophen-caffeine, rizatriptan, amphetamine-dextroamphetamine, amphetamine-dextroamphetamine, and ALPRAZolam.   Meds ordered this encounter  Medications  . DISCONTD: amphetamine-dextroamphetamine (ADDERALL XR) 30 MG 24 hr capsule    Sig: Take 1 capsule (30 mg total) by mouth daily.    Dispense:  30 capsule    Refill:  0    Order Specific Question:   Supervising Provider    Answer:   Hillard Danker A [[id number]]  . amitriptyline (ELAVIL) 25 MG tablet    Sig: Take 1 tablet (25 mg total) by mouth at bedtime.    Dispense:  30 tablet    Refill:  0    Order Specific Question:   Supervising Provider    Answer:   Hillard Danker A [[id number]]  . DISCONTD: amphetamine-dextroamphetamine (ADDERALL) 5 MG tablet    Sig: Take 1 tablet (5 mg total) by mouth daily as needed.    Dispense:  30 tablet    Refill:  0    Order Specific Question:   Supervising Provider  Answer:   Hillard DankerRAWFORD, ELIZABETH A [[id number]]  . fluconazole (DIFLUCAN) 150 MG tablet    Sig: Take 1 tablet (150 mg total) by mouth once.    Dispense:  1 tablet    Refill:  0    Order Specific Question:   Supervising Provider    Answer:   Hillard DankerRAWFORD, ELIZABETH A [[id number]]  . amphetamine-dextroamphetamine (ADDERALL) 5 MG tablet    Sig: Take 1 tablet (5 mg total) by mouth daily as needed.    Dispense:  30 tablet    Refill:  0    Order Specific Question:   Supervising Provider    Answer:   Hillard DankerRAWFORD, ELIZABETH A [[id number]]  . amphetamine-dextroamphetamine (ADDERALL XR) 30 MG 24 hr capsule    Sig: Take 1 capsule (30 mg total) by mouth daily.    Dispense:  30 capsule    Refill:  0    Order Specific Question:   Supervising Provider    Answer:   Hillard DankerRAWFORD, ELIZABETH A [[id number]]  . ALPRAZolam (XANAX) 0.25 MG tablet    Sig: Take 1 tablet (0.25 mg total) by mouth daily as needed for anxiety.    Dispense:  30 tablet    Refill:  1    Order Specific  Question:   Supervising Provider    Answer:   Hillard DankerRAWFORD, ELIZABETH A [[id number]]     Follow-up: Return in about 1 month (around 03/30/2016), or if symptoms worsen or fail to improve.  Jeanine Luzalone, Kamsiyochukwu Spickler, FNP

## 2016-03-01 ENCOUNTER — Ambulatory Visit: Payer: 59 | Admitting: Family

## 2016-03-01 ENCOUNTER — Ambulatory Visit: Payer: 59 | Admitting: Nurse Practitioner

## 2016-03-01 MED FILL — ALPRAZolam 0.25 MG TABS: 0.25 | 30 days supply | Qty: 30 | Fill #0

## 2016-03-04 MED FILL — AMPHETAMINE SALTS 5 MG TAB: 5 | 30 days supply | Qty: 30 | Fill #0

## 2016-03-04 MED FILL — DEXTROAMP-AMPHET ER 30 MG C: 30 | 30 days supply | Qty: 30 | Fill #0

## 2016-03-28 ENCOUNTER — Other Ambulatory Visit: Payer: Self-pay | Admitting: Family

## 2016-03-28 DIAGNOSIS — Z1239 Encounter for other screening for malignant neoplasm of breast: Secondary | ICD-10-CM

## 2016-04-03 ENCOUNTER — Telehealth: Payer: Self-pay | Admitting: *Deleted

## 2016-04-03 ENCOUNTER — Ambulatory Visit (INDEPENDENT_AMBULATORY_CARE_PROVIDER_SITE_OTHER): Payer: 59 | Admitting: Family

## 2016-04-03 ENCOUNTER — Encounter: Payer: Self-pay | Admitting: Family

## 2016-04-03 VITALS — BP 140/92 | HR 89 | Temp 98.5°F | Resp 18 | Ht 63.0 in | Wt 185.0 lb

## 2016-04-03 DIAGNOSIS — R05 Cough: Secondary | ICD-10-CM

## 2016-04-03 DIAGNOSIS — G479 Sleep disorder, unspecified: Secondary | ICD-10-CM

## 2016-04-03 DIAGNOSIS — L304 Erythema intertrigo: Secondary | ICD-10-CM

## 2016-04-03 DIAGNOSIS — F988 Other specified behavioral and emotional disorders with onset usually occurring in childhood and adolescence: Secondary | ICD-10-CM

## 2016-04-03 DIAGNOSIS — R059 Cough, unspecified: Secondary | ICD-10-CM | POA: Insufficient documentation

## 2016-04-03 MED ORDER — MICONAZOLE NITRATE 2 % EX CREA: 1.0000 "application " | TOPICAL_CREAM | Freq: Two times a day (BID) | CUTANEOUS | 0 refills | Status: DC

## 2016-04-03 MED ORDER — HYDROCOD POLST-CPM POLST ER 10-8 MG/5ML PO SUER: 5.0000 mL | Freq: Every evening | ORAL | 0 refills | Status: DC | PRN

## 2016-04-03 MED ORDER — TRAZODONE HCL 100 MG PO TABS: 300.0000 mg | ORAL_TABLET | Freq: Every day | ORAL | 0 refills | Status: DC

## 2016-04-03 MED ORDER — TRIAMCINOLONE ACETONIDE 0.025 % EX CREA: 1.0000 "application " | TOPICAL_CREAM | Freq: Two times a day (BID) | CUTANEOUS | 0 refills | Status: DC

## 2016-04-03 MED ORDER — AZITHROMYCIN 250 MG PO TABS: ORAL_TABLET | ORAL | 0 refills | Status: DC

## 2016-04-03 MED FILL — AZITHROMYCIN 250 MG TABLET: 250 | 5 days supply | Qty: 6 | Fill #0

## 2016-04-03 MED FILL — HYDROCODONE-CHLORPHENIRAM S: 10-8 | 23 days supply | Qty: 115 | Fill #0

## 2016-04-03 MED FILL — TRIAMCINOLONE 0.025% CREAM: 0.025 | 20 days supply | Qty: 30 | Fill #0

## 2016-04-03 MED FILL — ALPRAZolam 0.25 MG TABS: 0.25 | 30 days supply | Qty: 30 | Fill #1

## 2016-04-03 MED FILL — traZODone HCL 100 MG TABS: 100 | 30 days supply | Qty: 90 | Fill #0

## 2016-04-03 NOTE — Progress Notes (Signed)
Subjective:    Patient ID: Cynthia Townsend, female    DOB: June 30, 1980, 35 y.o.   MRN: [id number]  Chief Complaint  Patient presents with  . Cough    x2 weeks, productive cough, congestion, fever a couple days, weak    HPI:  Cynthia Townsend is a 35 y.o. female who  has a past medical history of ADD (attention deficit disorder); Anxiety; Chicken pox; Classic migraine with aura (09/20/2015); Depression; Ectopic pregnancy; Insomnia; Nasal polyps; Pregnancy induced hypertension; Sciatic pain; Subclinical hypothyroidism; and Vitamin D deficiency. and presents today for an office visit.  1.) Cough - This is a new problem. Associated symptoms of productive cough, congestion, and fever that has been going on for approximately 2 weeks. T-max of 101.1 with her last fever about 3 days ago. Severity of the cough is enough to keep her up at night. Sputum is described as thick and yellow. Modifying factors include Mucinex-D which does help with her symptoms. Course of the symptoms have progressively worsened since initial onset. No recent antibiotics.   2.) Rash - This is a new problem. Associated symptom of a rash located under her bilateral breasts has been going on for several months and describes the area as slightly red and itchy. Expresses that Cynthia Townsend tends to sweat a little more since her giving birth to her daughter. There are no modifying factors or attempted treatments. Overall course of the symptoms has waxed and waned.  3.) Sleep issues - Previously prescribed amitriptyline for sleep disturbance following long-term use of trazodone. Reports taking the medication as prescribed and denies adverse side effects. Notes that Cynthia Townsend has not slept as well since starting amitriptyline and would like to restart the trazodone.  Allergies  Allergen Reactions  . Penicillins Shortness Of Breath, Swelling and Other (See Comments)    Has patient had a PCN reaction causing immediate rash, facial/tongue/throat swelling,  SOB or lightheadedness with hypotension:Unknown Has patient had a PCN reaction causing severe rash involving mucus membranes or skin necrosis: unknown Has patient had a PCN reaction that required hospitalization unknown Has patient had a PCN reaction occurring within the last 10 years: uknown If all of the above answers are "NO", then may proceed with Cephalosporin use.     . Sulfa Antibiotics Hives and Rash      Outpatient Medications Prior to Visit  Medication Sig Dispense Refill  . ALPRAZolam (XANAX) 0.25 MG tablet Take 1 tablet (0.25 mg total) by mouth daily as needed for anxiety. 30 tablet 1  . amphetamine-dextroamphetamine (ADDERALL XR) 30 MG 24 hr capsule Take 1 capsule (30 mg total) by mouth daily. 30 capsule 0  . amphetamine-dextroamphetamine (ADDERALL) 5 MG tablet Take 1 tablet (5 mg total) by mouth daily as needed. 30 tablet 0  . butalbital-acetaminophen-caffeine (FIORICET, ESGIC) 50-325-40 MG tablet Take 1 tablet by mouth every 4 (four) hours as needed for headache. 30 tablet 1  . docusate sodium (STOOL SOFTENER) 100 MG capsule Take 100 mg by mouth 3 (three) times daily.     . norethindrone (MICRONOR,CAMILA,ERRIN) 0.35 MG tablet Reported on 11/30/2015  11  . propranolol (INDERAL) 20 MG tablet Take 1 tablet (20 mg total) by mouth 2 (two) times daily. 180 tablet 2  . rizatriptan (MAXALT-MLT) 10 MG disintegrating tablet Take 1 tablet (10 mg total) by mouth as needed for migraine. May repeat in 2 hours if needed 10 tablet 6  . amitriptyline (ELAVIL) 25 MG tablet Take 1 tablet (25 mg total) by mouth at bedtime. 30  tablet 0   No facility-administered medications prior to visit.       Past Surgical History:  Procedure Laterality Date  . BREAST BIOPSY     benign  . DILATION AND EVACUATION N/A 09/04/2015   Procedure: DILATATION AND EVACUATION;  Surgeon: Mitchel Honour, DO;  Location: [facility];  Service: Gynecology;  Laterality: N/A;      Past Medical History:  Diagnosis Date  .  ADD (attention deficit disorder)   . Anxiety   . Chicken pox   . Classic migraine with aura 09/20/2015  . Depression   . Ectopic pregnancy   . Insomnia   . Nasal polyps   . Pregnancy induced hypertension   . Sciatic pain   . Subclinical hypothyroidism   . Vitamin D deficiency       Review of Systems  Constitutional: Positive for fatigue and fever. Negative for chills.  HENT: Positive for congestion. Negative for ear pain, sinus pressure and sore throat.   Respiratory: Positive for cough and wheezing. Negative for chest tightness and shortness of breath.   Gastrointestinal: Negative for nausea and vomiting.  Neurological: Positive for weakness and headaches.      Objective:    BP (!) 140/92 (BP Location: Left Arm, Patient Position: Sitting, Cuff Size: Normal)   Pulse 89   Temp 98.5 F (36.9 C) (Oral)   Resp 18   Ht 5\' 3"  (1.6 m)   Wt 185 lb (83.9 kg)   SpO2 98%   BMI 32.77 kg/m  Nursing note and vital signs reviewed. Chaperone present for breast exam.   Physical Exam  Constitutional: Cynthia Townsend is oriented to person, place, and time. Cynthia Townsend appears well-developed and well-nourished. No distress.  HENT:  Right Ear: Hearing, tympanic membrane, external ear and ear canal normal.  Left Ear: Hearing, tympanic membrane, external ear and ear canal normal.  Nose: Nose normal. Right sinus exhibits no maxillary sinus tenderness and no frontal sinus tenderness. Left sinus exhibits no maxillary sinus tenderness and no frontal sinus tenderness.  Mouth/Throat: Uvula is midline, oropharynx is clear and moist and mucous membranes are normal.  Cardiovascular: Normal rate, regular rhythm, normal heart sounds and intact distal pulses.   Pulmonary/Chest: Effort normal and breath sounds normal. Cynthia Townsend has no wheezes. Cynthia Townsend has no rales. Cynthia Townsend exhibits no tenderness.  Neurological: Cynthia Townsend is alert and oriented to person, place, and time.  Skin: Skin is warm and dry.  Mild tan discoloration with no skin  breakdown or signs of infection under bilateral breasts  Psychiatric: Cynthia Townsend has a normal mood and affect. Her behavior is normal. Judgment and thought content normal.       Assessment & Plan:   Problem List Items Addressed This Visit      Musculoskeletal and Integument   Intertrigo    Rash consistent with mild intertrigo most likely associated with excessive perspiration. Start miconazole and triamcinolone. Encouraged to wear cotton and use absorbing powders. Follow-up if symptoms worsen or do not improve.      Relevant Medications   miconazole (MICATIN) 2 % cream   triamcinolone (KENALOG) 0.025 % cream     Other   Sleep disturbance    Continues to experience sleep disturbance with no significant improvement since starting amitriptyline. Discontinue amitriptyline and restart trazodone. Encouraged good sleep hygiene. Continue to monitor.      Cough - Primary    Symptoms and exam consistent with acute bronchitis. Start azithromycin and Tussionex. Continue over-the-counter medications as needed for symptom relief and supportive care.  Sample of Symbicort provided. Follow-up if symptoms worsen or do not improve.      Relevant Medications   azithromycin (ZITHROMAX) 250 MG tablet   chlorpheniramine-HYDROcodone (TUSSIONEX PENNKINETIC ER) 10-8 MG/5ML SUER    Other Visit Diagnoses   None.      I have discontinued Cynthia Townsend amitriptyline. I am also having her start on miconazole, triamcinolone, azithromycin, chlorpheniramine-HYDROcodone, and traZODone. Additionally, I am having her maintain her docusate sodium, norethindrone, propranolol, butalbital-acetaminophen-caffeine, rizatriptan, amphetamine-dextroamphetamine, amphetamine-dextroamphetamine, and ALPRAZolam.   Meds ordered this encounter  Medications  . miconazole (MICATIN) 2 % cream    Sig: Apply 1 application topically 2 (two) times daily.    Dispense:  28.35 g    Refill:  0    Order Specific Question:   Supervising  Provider    Answer:   Hillard DankerRAWFORD, ELIZABETH A [[id number]]  . triamcinolone (KENALOG) 0.025 % cream    Sig: Apply 1 application topically 2 (two) times daily.    Dispense:  30 g    Refill:  0    Order Specific Question:   Supervising Provider    Answer:   Hillard DankerRAWFORD, ELIZABETH A [[id number]]  . azithromycin (ZITHROMAX) 250 MG tablet    Sig: Take 2 tablets by mouth for 1 day and then 1 tablet by mouth daily for 4 days.    Dispense:  6 tablet    Refill:  0    Order Specific Question:   Supervising Provider    Answer:   Hillard DankerRAWFORD, ELIZABETH A [[id number]]  . chlorpheniramine-HYDROcodone (TUSSIONEX PENNKINETIC ER) 10-8 MG/5ML SUER    Sig: Take 5 mLs by mouth at bedtime as needed.    Dispense:  115 mL    Refill:  0    Order Specific Question:   Supervising Provider    Answer:   Hillard DankerRAWFORD, ELIZABETH A [[id number]]  . traZODone (DESYREL) 100 MG tablet    Sig: Take 3 tablets (300 mg total) by mouth at bedtime.    Dispense:  90 tablet    Refill:  0    Order Specific Question:   Supervising Provider    Answer:   Hillard DankerRAWFORD, ELIZABETH A [[id number]]     Follow-up: Return in about 2 months (around 06/03/2016), or if symptoms worsen or fail to improve.  Jeanine Luzalone, Lache Dagher, FNP

## 2016-04-03 NOTE — Assessment & Plan Note (Signed)
Continues to experience sleep disturbance with no significant improvement since starting amitriptyline. Discontinue amitriptyline and restart trazodone. Encouraged good sleep hygiene. Continue to monitor.

## 2016-04-03 NOTE — Patient Instructions (Signed)
Thank you for choosing [facility].  SUMMARY AND INSTRUCTIONS:  Medication:  Your prescription(s) have been submitted to your pharmacy or been printed and provided for you. Please take as directed and contact our office if you believe you are having problem(s) with the medication(s) or have any questions.  Follow up:  If your symptoms worsen or fail to improve, please contact our office for further instruction, or in case of emergency go directly to the emergency room at the closest medical facility.   General Recommendations:    Please drink plenty of fluids.  Get plenty of rest   Sleep in humidified air  Use saline nasal sprays  Netti pot   OTC Medications:  Decongestants - helps relieve congestion   Flonase (generic fluticasone) or Nasacort (generic triamcinolone) - please make sure to use the "cross-over" technique at a 45 degree angle towards the opposite eye as opposed to straight up the nasal passageway.   Sudafed (generic pseudoephedrine - Note this is the one that is available behind the pharmacy counter); Products with phenylephrine (-PE) may also be used but is often not as effective as pseudoephedrine.   If you have HIGH BLOOD PRESSURE - Coricidin HBP; AVOID any product that is -D as this contains pseudoephedrine which may increase your blood pressure.  Afrin (oxymetazoline) every 6-8 hours for up to 3 days.   Allergies - helps relieve runny nose, itchy eyes and sneezing   Claritin (generic loratidine), Allegra (fexofenidine), or Zyrtec (generic cyrterizine) for runny nose. These medications should not cause drowsiness.  Note - Benadryl (generic diphenhydramine) may be used however may cause drowsiness  Cough -   Delsym or Robitussin (generic dextromethorphan)  Expectorants - helps loosen mucus to ease removal   Mucinex (generic guaifenesin) as directed on the package.  Headaches / General Aches   Tylenol (generic acetaminophen) - DO NOT  EXCEED 3 grams (3,000 mg) in a 24 hour time period  Advil/Motrin (generic ibuprofen)   Sore Throat -   Salt water gargle   Chloraseptic (generic benzocaine) spray or lozenges / Sucrets (generic dyclonine)    Intertrigo Intertrigo is a skin condition that occurs in between folds of skin in places on the body that rub together a lot and do not get much ventilation. It is caused by heat, moisture, friction, sweat retention, and lack of air circulation, which produces red, irritated patches and, sometimes, scaling or drainage. People who have diabetes, who are obese, or who have treatment with antibiotics are at increased risk for intertrigo. The most common sites for intertrigo to occur include:  The groin.  The breasts.  The armpits.  Folds of abdominal skin.  Webbed spaces between the fingers or toes. Intertrigo may be aggravated by:  Sweat.  Feces.  Yeast or bacteria that are present near skin folds.  Urine.  Vaginal discharge. HOME CARE INSTRUCTIONS  The following steps can be taken to reduce friction and keep the affected area cool and dry:  Expose skin folds to the air.  Keep deep skin folds separated with cotton or linen cloth. Avoid tight fitting clothing that could cause chafing.  Wear open-toed shoes or sandals to help reduce moisture between the toes.  Apply absorbent powders to affected areas as directed by your caregiver.  Apply over-the-counter barrier pastes, such as zinc oxide, as directed by your caregiver.  If you develop a fungal infection in the affected area, your caregiver may have you use antifungal creams. SEEK MEDICAL CARE IF:   The  rash is not improving after 1 week of treatment.  The rash is getting worse (more red, more swollen, more painful, or spreading).  You have a fever or chills. MAKE SURE YOU:   Understand these instructions.  Will watch your condition.  Will get help right away if you are not doing well or get worse.    This information is not intended to replace advice given to you by your health care provider. Make sure you discuss any questions you have with your health care provider.   Document Released: 05/27/2005 Document Revised: 08/19/2011 Document Reviewed: 11/28/2014 Elsevier Interactive Patient Education [employer]2016 [employer] Inc[employer]

## 2016-04-03 NOTE — Assessment & Plan Note (Signed)
Rash consistent with mild intertrigo most likely associated with excessive perspiration. Start miconazole and triamcinolone. Encouraged to wear cotton and use absorbing powders. Follow-up if symptoms worsen or do not improve.

## 2016-04-03 NOTE — Telephone Encounter (Signed)
Left msg on triage requesting refills on both adderralls...Raechel Chute/lmb

## 2016-04-03 NOTE — Assessment & Plan Note (Signed)
Symptoms and exam consistent with acute bronchitis. Start azithromycin and Tussionex. Continue over-the-counter medications as needed for symptom relief and supportive care. Sample of Symbicort provided. Follow-up if symptoms worsen or do not improve.

## 2016-04-04 MED ORDER — AMPHETAMINE-DEXTROAMPHETAMINE 5 MG PO TABS: 5.0000 mg | ORAL_TABLET | Freq: Every day | ORAL | 0 refills | Status: DC | PRN

## 2016-04-04 MED ORDER — AMPHETAMINE-DEXTROAMPHET ER 30 MG PO CP24: 30.0000 mg | ORAL_CAPSULE | Freq: Every day | ORAL | 0 refills | Status: DC

## 2016-04-04 NOTE — Telephone Encounter (Signed)
Medication refilled

## 2016-04-04 NOTE — Telephone Encounter (Signed)
Called pt no answer [facility] rx's ready for pick-up...Cynthia Townsend/LMB

## 2016-04-05 ENCOUNTER — Ambulatory Visit: Payer: 59

## 2016-04-09 ENCOUNTER — Encounter: Payer: Self-pay | Admitting: Family

## 2016-04-12 ENCOUNTER — Telehealth: Payer: Self-pay | Admitting: Family

## 2016-04-12 NOTE — Telephone Encounter (Signed)
Patient has seen Tammy SoursGreg for bronchitis.  She was prescribed antibiotic.  Patient states she has finished this.  States she started to feel better on Monday but then on Wed she started to feel worse than when she was first seen.  States there is a lot going around at her work that she believes is being past back and forth.  Patient has cough, congestion, sore throat from coughing and tired. Please advise on what the next step should be for patient.

## 2016-04-14 MED ORDER — LEVOFLOXACIN 500 MG PO TABS: 500.0000 mg | ORAL_TABLET | Freq: Every day | ORAL | 0 refills | Status: DC

## 2016-04-14 NOTE — Telephone Encounter (Signed)
Levofloxacin has been sent to her pharmacy. Please ensure she is no longer breastfeeding.

## 2016-04-15 MED FILL — DEXTROAMP-AMPHET ER 30 MG C: 30 | 30 days supply | Qty: 30 | Fill #0

## 2016-04-15 MED FILL — levoFLOXacin 500 MG TABS: 500 | 7 days supply | Qty: 7 | Fill #0

## 2016-04-15 MED FILL — AMPHETAMINE SALTS 5 MG TAB: 5 | 30 days supply | Qty: 30 | Fill #0

## 2016-04-15 NOTE — Telephone Encounter (Signed)
Left patient Gregs response on vm.  Notified to call us back to make sure she is not breastfeeding.

## 2016-04-15 NOTE — Telephone Encounter (Signed)
Pt aware.

## 2016-04-15 NOTE — Telephone Encounter (Signed)
Pt is not breastfeeding anymore. Pt aware to pick up rx, but pt was wondering if Cynthia SoursGreg thinks this might be bronchitis?or what. Please call her or massage her on mychart back.

## 2016-04-15 NOTE — Telephone Encounter (Signed)
Yes, this appears to be bronchitis as discussed. It may take a little while for the cough to go away even after the antibiotics are completed.

## 2016-04-15 NOTE — Telephone Encounter (Signed)
Please advise 

## 2016-05-07 ENCOUNTER — Encounter: Payer: Self-pay | Admitting: Family

## 2016-05-07 ENCOUNTER — Other Ambulatory Visit: Payer: Self-pay | Admitting: Family

## 2016-05-07 DIAGNOSIS — F988 Other specified behavioral and emotional disorders with onset usually occurring in childhood and adolescence: Secondary | ICD-10-CM

## 2016-05-07 MED ORDER — AMPHETAMINE-DEXTROAMPHETAMINE 30 MG PO TABS
30.0000 mg | ORAL_TABLET | Freq: Every day | ORAL | 0 refills | Status: DC
Start: 2016-05-07 — End: 2016-07-23

## 2016-05-07 MED ORDER — AMPHETAMINE-DEXTROAMPHETAMINE 30 MG PO TABS
30.0000 mg | ORAL_TABLET | Freq: Every day | ORAL | 0 refills | Status: DC
Start: 2016-05-07 — End: 2016-05-07

## 2016-05-07 MED ORDER — AMPHETAMINE-DEXTROAMPHETAMINE 5 MG PO TABS: 5.0000 mg | ORAL_TABLET | Freq: Every day | ORAL | 0 refills | Status: DC | PRN

## 2016-05-08 ENCOUNTER — Other Ambulatory Visit: Payer: Self-pay | Admitting: Family

## 2016-05-13 ENCOUNTER — Other Ambulatory Visit: Payer: Self-pay | Admitting: Family

## 2016-05-13 DIAGNOSIS — F411 Generalized anxiety disorder: Secondary | ICD-10-CM

## 2016-05-13 MED FILL — AMPHETAMINE SALTS 30 MG TAB: 30 | 30 days supply | Qty: 30 | Fill #0

## 2016-05-13 MED FILL — DEXTROAMP-AMPHETAMINE 5 MG: 5 | 30 days supply | Qty: 30 | Fill #0

## 2016-05-13 NOTE — Telephone Encounter (Signed)
Last refill:02/29/16

## 2016-05-14 ENCOUNTER — Ambulatory Visit: Payer: 59

## 2016-05-15 MED FILL — traZODone HCL 100 MG TABS: 100 | 30 days supply | Qty: 90 | Fill #0

## 2016-05-15 MED FILL — ALPRAZolam 0.25 MG TABS: 0.25 | 30 days supply | Qty: 30 | Fill #0

## 2016-05-15 NOTE — Telephone Encounter (Signed)
Rx faxed

## 2016-05-20 ENCOUNTER — Encounter: Payer: Self-pay | Admitting: Family

## 2016-05-20 ENCOUNTER — Ambulatory Visit (INDEPENDENT_AMBULATORY_CARE_PROVIDER_SITE_OTHER): Payer: 59 | Admitting: Family

## 2016-05-20 DIAGNOSIS — J0100 Acute maxillary sinusitis, unspecified: Secondary | ICD-10-CM | POA: Diagnosis not present

## 2016-05-20 DIAGNOSIS — J019 Acute sinusitis, unspecified: Secondary | ICD-10-CM | POA: Insufficient documentation

## 2016-05-20 MED ORDER — FLUTICASONE FUROATE-VILANTEROL 100-25 MCG/INH IN AEPB: 1.0000 | INHALATION_SPRAY | Freq: Every day | RESPIRATORY_TRACT | 0 refills | Status: DC

## 2016-05-20 MED ORDER — HYDROCODONE-HOMATROPINE 5-1.5 MG/5ML PO SYRP: 5.0000 mL | ORAL_SOLUTION | Freq: Four times a day (QID) | ORAL | 0 refills | Status: DC | PRN

## 2016-05-20 MED ORDER — LEVOFLOXACIN 500 MG PO TABS: 500.0000 mg | ORAL_TABLET | Freq: Every day | ORAL | 0 refills | Status: DC

## 2016-05-20 MED FILL — HYDROCODONE-HOMATROPINE SYR: 5-1.5 | 6 days supply | Qty: 120 | Fill #0

## 2016-05-20 MED FILL — levoFLOXacin 500 MG TABS: 500 | 10 days supply | Qty: 10 | Fill #0

## 2016-05-20 NOTE — Assessment & Plan Note (Addendum)
Symptoms and exam are consistent with bacterial maxillary sinusitis with concern for underlying bronchitis. Start levofloxacin. Start hycodan as needed for cough and sleep. Sample of Breo provided for wheezing. Continue over the counter medications as needed for symptom relief and supportive care.

## 2016-05-20 NOTE — Progress Notes (Signed)
Subjective:    Patient ID: Cynthia Townsend, female    DOB: 10/27/1980, 35 y.o.   MRN: [id number]  Chief Complaint  Patient presents with  . Cough    cough, congestion, SOB, has been going on on and off since she was diagnosed with bronchitis    HPI:  Cynthia Townsend is a 35 y.o. female who  has a past medical history of ADD (attention deficit disorder); Anxiety; Chicken pox; Classic migraine with aura (09/20/2015); Depression; Ectopic pregnancy; Insomnia; Nasal polyps; Pregnancy induced hypertension; Sciatic pain; Subclinical hypothyroidism; and Vitamin D deficiency. and presents today for an acute office visit.  This is a new problem. Associated symptoms of cough, congestion, shortness of breath, sinus pressure, headaches and sore throat that has been going on for about 1 week. Initially felt better a few days after onset and has progressively worsened since. Did have a fever of 101 yesterday which was her last fever. Modifying factors include Mucinex DM, Nyquil, Theraflu, and Tylenol which have not helped very much. Cough severity is enough to disturb her sleep. Has had her flu shot this year.      Allergies  Allergen Reactions  . Penicillins Shortness Of Breath, Swelling and Other (See Comments)    Has patient had a PCN reaction causing immediate rash, facial/tongue/throat swelling, SOB or lightheadedness with hypotension:Unknown Has patient had a PCN reaction causing severe rash involving mucus membranes or skin necrosis: unknown Has patient had a PCN reaction that required hospitalization unknown Has patient had a PCN reaction occurring within the last 10 years: uknown If all of the above answers are "NO", then may proceed with Cephalosporin use.     . Sulfa Antibiotics Hives and Rash      Outpatient Medications Prior to Visit  Medication Sig Dispense Refill  . ALPRAZolam (XANAX) 0.25 MG tablet TAKE 1 TABLET BY MOUTH ONCE DAILY AS NEEDED FOR ANXIETY 30 tablet 1  .  amphetamine-dextroamphetamine (ADDERALL) 30 MG tablet Take 1 tablet by mouth daily. 30 tablet 0  . amphetamine-dextroamphetamine (ADDERALL) 5 MG tablet Take 1 tablet (5 mg total) by mouth daily as needed. 30 tablet 0  . butalbital-acetaminophen-caffeine (FIORICET, ESGIC) 50-325-40 MG tablet Take 1 tablet by mouth every 4 (four) hours as needed for headache. 30 tablet 1  . chlorpheniramine-HYDROcodone (TUSSIONEX PENNKINETIC ER) 10-8 MG/5ML SUER Take 5 mLs by mouth at bedtime as needed. 115 mL 0  . docusate sodium (STOOL SOFTENER) 100 MG capsule Take 100 mg by mouth 3 (three) times daily.     . miconazole (MICATIN) 2 % cream Apply 1 application topically 2 (two) times daily. 28.35 g 0  . norethindrone (MICRONOR,CAMILA,ERRIN) 0.35 MG tablet Reported on 11/30/2015  11  . propranolol (INDERAL) 20 MG tablet Take 1 tablet (20 mg total) by mouth 2 (two) times daily. 180 tablet 2  . rizatriptan (MAXALT-MLT) 10 MG disintegrating tablet Take 1 tablet (10 mg total) by mouth as needed for migraine. May repeat in 2 hours if needed 10 tablet 6  . traZODone (DESYREL) 100 MG tablet TAKE 3 TABLETS (300 MG TOTAL) BY MOUTH AT BEDTIME. 90 tablet 0  . triamcinolone (KENALOG) 0.025 % cream Apply 1 application topically 2 (two) times daily. 30 g 0  . levofloxacin (LEVAQUIN) 500 MG tablet Take 1 tablet (500 mg total) by mouth daily. 7 tablet 0   No facility-administered medications prior to visit.      Review of Systems  Constitutional: Positive for chills and fever.  HENT: Positive for congestion,  postnasal drip, sinus pressure and sore throat. Negative for ear pain and sinus pain.   Respiratory: Positive for cough and shortness of breath. Negative for chest tightness.   Neurological: Positive for headaches.      Objective:    BP 132/88 (BP Location: Left Arm, Patient Position: Sitting, Cuff Size: Large)   Pulse (!) 106   Temp 98.7 F (37.1 C) (Oral)   Resp 18   Ht 5\' 3"  (1.6 m)   Wt 190 lb (86.2 kg)   SpO2  98%   BMI 33.66 kg/m  Nursing note and vital signs reviewed.  Physical Exam  Constitutional: She is oriented to person, place, and time. She appears well-developed and well-nourished.  HENT:  Right Ear: Hearing, tympanic membrane, external ear and ear canal normal.  Left Ear: Hearing, tympanic membrane, external ear and ear canal normal.  Nose: Right sinus exhibits maxillary sinus tenderness. Right sinus exhibits no frontal sinus tenderness. Left sinus exhibits maxillary sinus tenderness. Left sinus exhibits no frontal sinus tenderness.  Mouth/Throat: Uvula is midline and mucous membranes are normal. Posterior oropharyngeal erythema present. No oropharyngeal exudate, posterior oropharyngeal edema or tonsillar abscesses.  Neck: Neck supple.  Cardiovascular: Normal rate, regular rhythm, normal heart sounds and intact distal pulses.   Pulmonary/Chest: Effort normal and breath sounds normal.  Neurological: She is alert and oriented to person, place, and time.  Skin: Skin is warm and dry.       Assessment & Plan:   Problem List Items Addressed This Visit      Respiratory   Acute sinusitis    Symptoms and exam are consistent with bacterial maxillary sinusitis with concern for underlying bronchitis. Start levofloxacin. Start hycodan as needed for cough and sleep. Sample of Breo provided for wheezing. Continue over the counter medications as needed for symptom relief and supportive care.       Relevant Medications   HYDROcodone-homatropine (HYCODAN) 5-1.5 MG/5ML syrup   levofloxacin (LEVAQUIN) 500 MG tablet   fluticasone furoate-vilanterol (BREO ELLIPTA) 100-25 MCG/INH AEPB       I am having Ms. Bufano start on HYDROcodone-homatropine and fluticasone furoate-vilanterol. I am also having her maintain her docusate sodium, norethindrone, propranolol, butalbital-acetaminophen-caffeine, rizatriptan, miconazole, triamcinolone, chlorpheniramine-HYDROcodone, amphetamine-dextroamphetamine,  amphetamine-dextroamphetamine, ALPRAZolam, traZODone, and levofloxacin.   Meds ordered this encounter  Medications  . HYDROcodone-homatropine (HYCODAN) 5-1.5 MG/5ML syrup    Sig: Take 5 mLs by mouth every 6 (six) hours as needed for cough.    Dispense:  120 mL    Refill:  0    Order Specific Question:   Supervising Provider    Answer:   Hillard DankerRAWFORD, ELIZABETH A [[id number]]  . levofloxacin (LEVAQUIN) 500 MG tablet    Sig: Take 1 tablet (500 mg total) by mouth daily.    Dispense:  10 tablet    Refill:  0    Order Specific Question:   Supervising Provider    Answer:   Hillard DankerRAWFORD, ELIZABETH A [[id number]]  . fluticasone furoate-vilanterol (BREO ELLIPTA) 100-25 MCG/INH AEPB    Sig: Inhale 1 puff into the lungs daily.    Dispense:  28 each    Refill:  0    Order Specific Question:   Supervising Provider    Answer:   Hillard DankerRAWFORD, ELIZABETH A [[id number]]     Follow-up: Return if symptoms worsen or fail to improve.  Jeanine Luzalone, Christene Pounds, FNP

## 2016-05-20 NOTE — Patient Instructions (Signed)
Thank you for choosing [facility].  SUMMARY AND INSTRUCTIONS:  Medication:  Please start the levofloxacin.  Hycodan as needed for cough and sleep.   Your prescription(s) have been submitted to your pharmacy or been printed and provided for you. Please take as directed and contact our office if you believe you are having problem(s) with the medication(s) or have any questions.  Follow up:  If your symptoms worsen or fail to improve, please contact our office for further instruction, or in case of emergency go directly to the emergency room at the closest medical facility.   General Recommendations:    Please drink plenty of fluids.  Get plenty of rest   Sleep in humidified air  Use saline nasal sprays  Netti pot   OTC Medications:  Decongestants - helps relieve congestion   Flonase (generic fluticasone) or Nasacort (generic triamcinolone) - please make sure to use the "cross-over" technique at a 45 degree angle towards the opposite eye as opposed to straight up the nasal passageway.   Sudafed (generic pseudoephedrine - Note this is the one that is available behind the pharmacy counter); Products with phenylephrine (-PE) may also be used but is often not as effective as pseudoephedrine.   If you have HIGH BLOOD PRESSURE - Coricidin HBP; AVOID any product that is -D as this contains pseudoephedrine which may increase your blood pressure.  Afrin (oxymetazoline) every 6-8 hours for up to 3 days.   Allergies - helps relieve runny nose, itchy eyes and sneezing   Claritin (generic loratidine), Allegra (fexofenidine), or Zyrtec (generic cyrterizine) for runny nose. These medications should not cause drowsiness.  Note - Benadryl (generic diphenhydramine) may be used however may cause drowsiness  Cough -   Delsym or Robitussin (generic dextromethorphan)  Expectorants - helps loosen mucus to ease removal   Mucinex (generic guaifenesin) as directed on the  package.  Headaches / General Aches   Tylenol (generic acetaminophen) - DO NOT EXCEED 3 grams (3,000 mg) in a 24 hour time period  Advil/Motrin (generic ibuprofen)   Sore Throat -   Salt water gargle   Chloraseptic (generic benzocaine) spray or lozenges / Sucrets (generic dyclonine)

## 2016-06-12 ENCOUNTER — Other Ambulatory Visit: Payer: Self-pay

## 2016-06-12 ENCOUNTER — Telehealth: Payer: Self-pay | Admitting: Family

## 2016-06-12 DIAGNOSIS — R059 Cough, unspecified: Secondary | ICD-10-CM

## 2016-06-12 DIAGNOSIS — J189 Pneumonia, unspecified organism: Secondary | ICD-10-CM | POA: Diagnosis not present

## 2016-06-12 DIAGNOSIS — R05 Cough: Secondary | ICD-10-CM

## 2016-06-12 MED ORDER — DOXYCYCLINE HYCLATE 100 MG PO TABS: 100.0000 mg | ORAL_TABLET | Freq: Two times a day (BID) | ORAL | 0 refills | Status: DC

## 2016-06-12 NOTE — Telephone Encounter (Signed)
Called pt to see what kind of symptoms she is still having.

## 2016-06-12 NOTE — Telephone Encounter (Signed)
Pt request to speak to the assistant concern about her condition. Pt stated she saw Cynthia Townsend twice already and she is not better. Please give her a call back

## 2016-06-12 NOTE — Telephone Encounter (Signed)
Chest x-ray placed with prescription for doxycycline sent to pharmacy.

## 2016-06-17 ENCOUNTER — Ambulatory Visit: Payer: 59 | Admitting: Family

## 2016-06-17 IMAGING — MR MR HEAD WO/W CM
11 of 14 series · 32 of 48 positions shown · IV contrast (multihance)
Comparison: None.

CLINICAL DATA: Intractable migraine with status migraine oasis.

EXAM:
MRI HEAD WITHOUT AND WITH CONTRAST
TECHNIQUE: Multiplanar, multiecho pulse sequences of the brain and surrounding
structures were obtained without and with intravenous contrast.
CONTRAST:  18mL MULTIHANCE GADOBENATE DIMEGLUMINE 529 MG/ML IV SOLN

[Series 2: FLAIR · sagittal · 5.0mm · 0.47mm/px · 1 of 23 slices shown (1 of 3)]
[im 1/23]
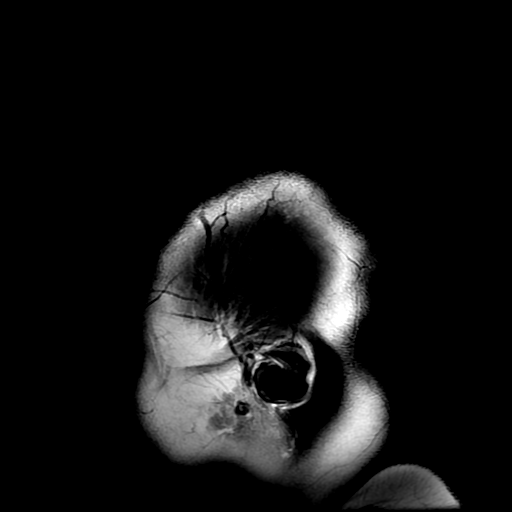

[Series 4: DWI · axial · 3.0mm · 0.94mm/px · z∈[-90,+50]mm · 7 of 96 slices shown (1 of 4)]
[im 1/96]
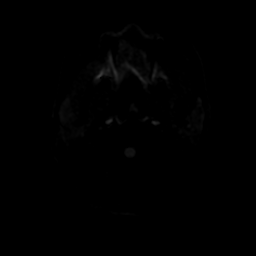
[im 16/96]
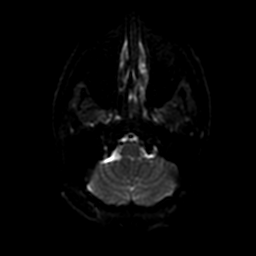
[im 32/96]
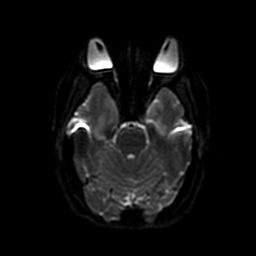
[im 48/96]
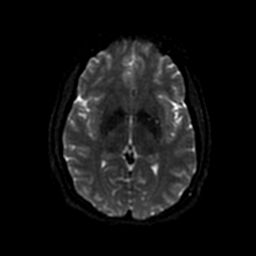
[im 64/96]
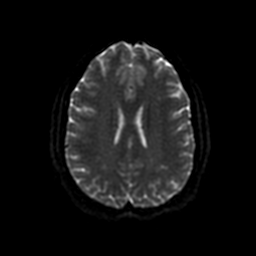
[im 80/96]
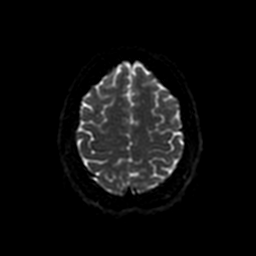
[im 96/96]
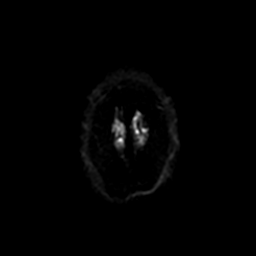

[Series 5: DWI · coronal · 4.0mm · 0.94mm/px · 5 of 70 slices shown (2 of 4)]
[im 1/70]
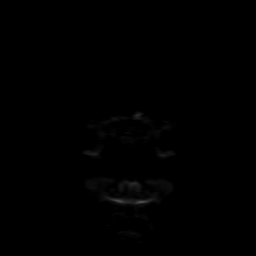
[im 18/70]
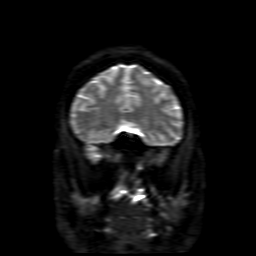
[im 35/70]
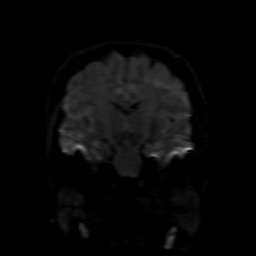
[im 52/70]
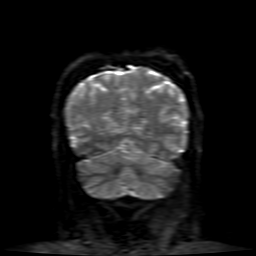
[im 70/70]
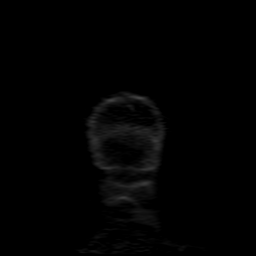

[Series 6: T2 · axial · 5.0mm · 0.43mm/px · z∈[-90,+47]mm · 2 of 24 slices shown]
[im 1/24]
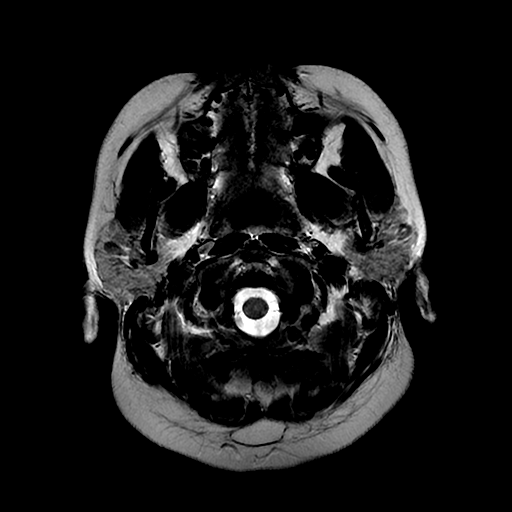
[im 24/24]
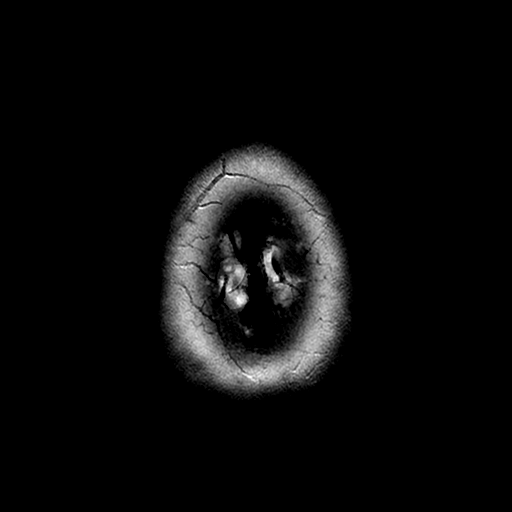

[Series 7: FLAIR · axial · 5.0mm · 0.43mm/px · z∈[-90,+47]mm · 2 of 24 slices shown (2 of 3)]
[im 1/24]
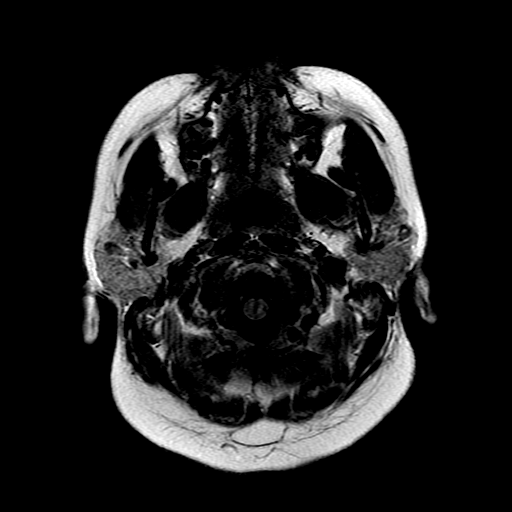
[im 24/24]
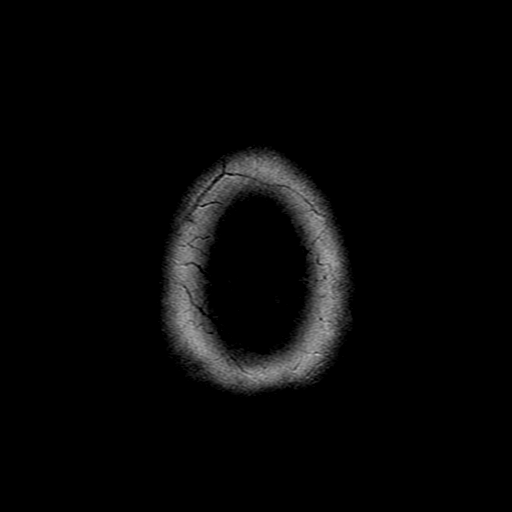

[Series 8: (person_name) · axial · 3.0mm · 0.43mm/px · z∈[-91,-72]mm · 2 of 96 slices shown]
[im 1/96]
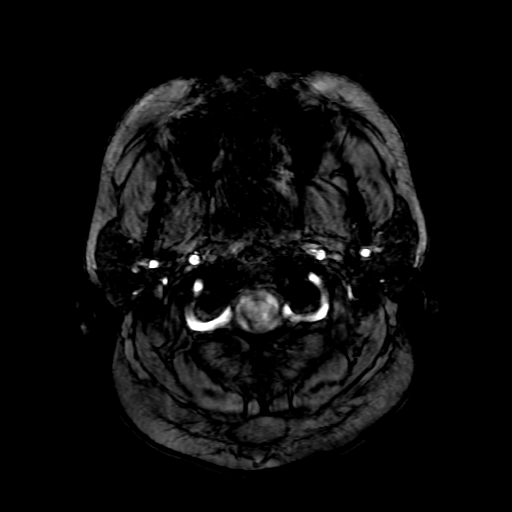
[im 14/96]
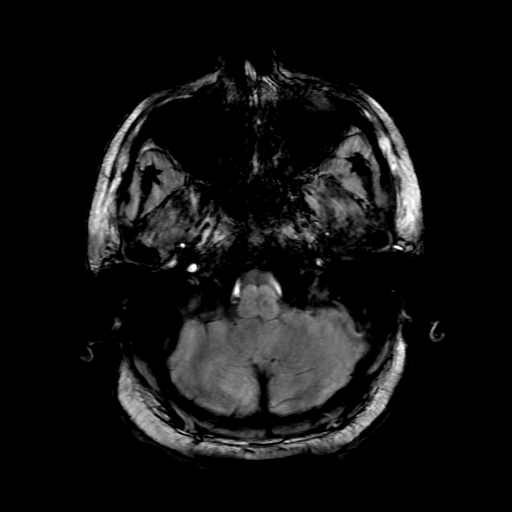

[Series 10: FLAIR · axial · 5.0mm · 0.43mm/px · z∈[-90,+47]mm · 2 of 24 slices shown (3 of 3)]
[im 1/24]
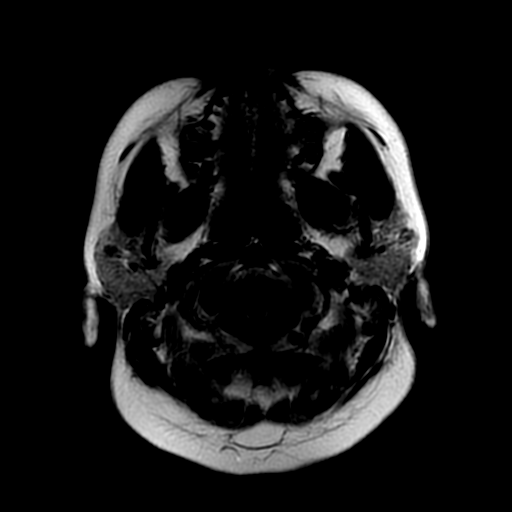
[im 24/24]
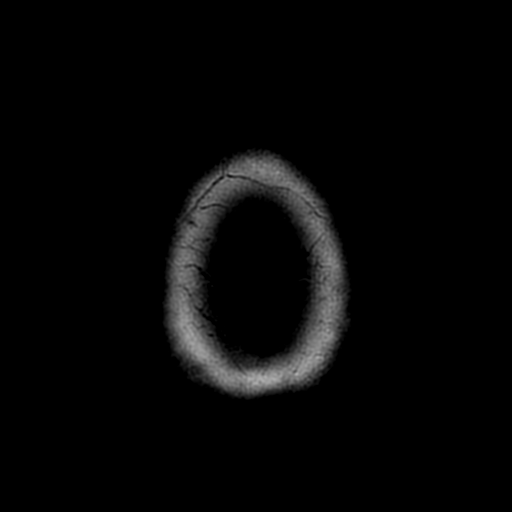

[Series 11: T2 post-contrast · coronal · 5.0mm · 0.39mm/px · 2 of 28 slices shown]
[im 1/28]
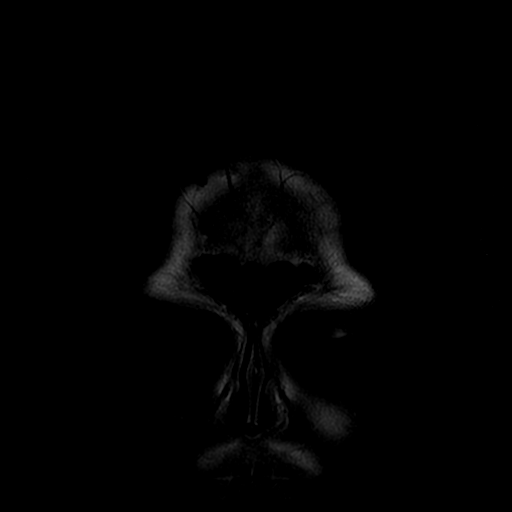
[im 28/28]
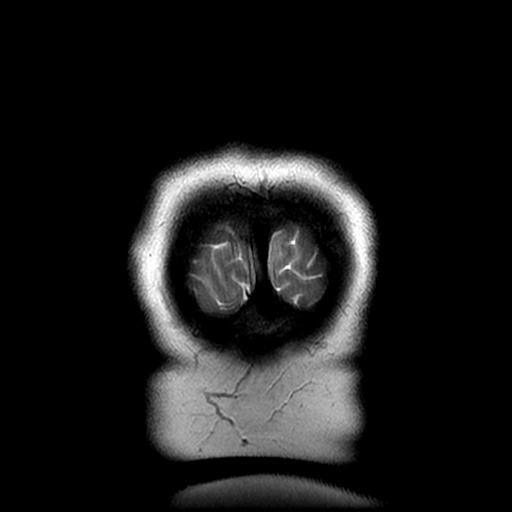

[Series 13: T1 · coronal · 5.0mm · 0.39mm/px · 2 of 28 slices shown]
[im 1/28]
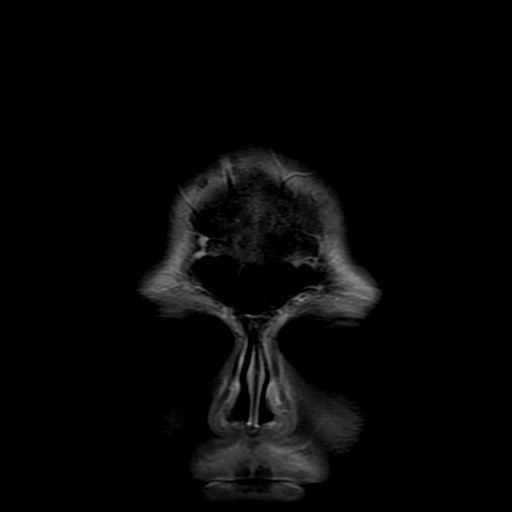
[im 28/28]
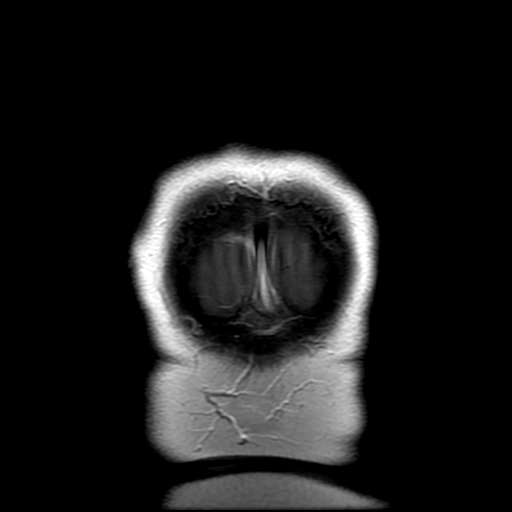

[Series 400: DWI · axial · 3.0mm · 0.94mm/px · z∈[-90,+50]mm · 4 of 48 slices shown (3 of 4)]
[im 1/48]
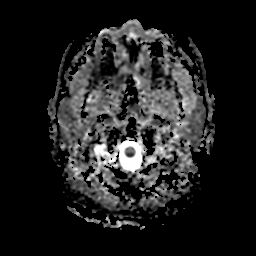
[im 16/48]
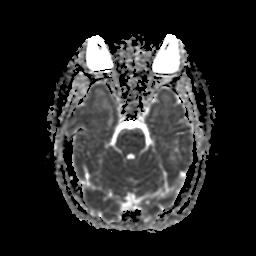
[im 32/48]
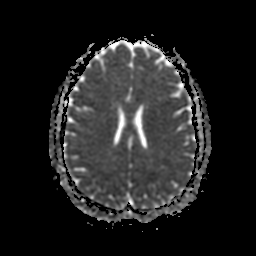
[im 48/48]
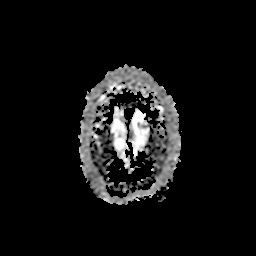

[Series 500: DWI · coronal · 4.0mm · 0.94mm/px · 3 of 34 slices shown (4 of 4)]
[im 1/34]
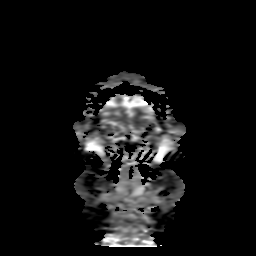
[im 17/34]
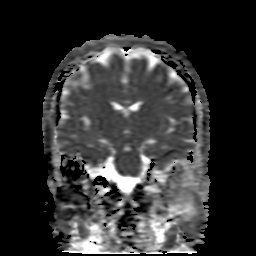
[im 34/34]
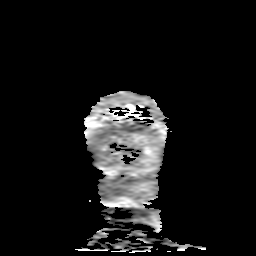

[32 of 48 positions shown; findings below may reference images not displayed]

FINDINGS: Ventricle size normal.  Cerebral volume normal.

Negative for acute infarct

Small hyperintensities are noted in the frontal white matter
bilaterally. Remainder of the white matter normal. Basal ganglia and
brainstem normal.

Negative for intracranial hemorrhage. No soft tissue mass identified

Cyst in the right inferior cerebellar pontine angle cistern
measuring 16 mm. This is just below the internal auditory canal and
is most consistent with arachnoid cyst. This follows CSF on all
sequences including diffusion

Postcontrast imaging demonstrates normal enhancement. No enhancing
mass lesion. Vascular enhancement is normal.

Pituitary normal in size.  Normal skullbase.  Normal orbit.

Paranasal sinuses clear.
IMPRESSION: 16 mm arachnoid cyst right lower cerebellar pontine angle cistern.
This is most likely an incidental finding. Otherwise normal.

## 2016-06-28 ENCOUNTER — Encounter: Payer: Self-pay | Admitting: Family

## 2016-06-28 ENCOUNTER — Telehealth: Payer: Self-pay

## 2016-06-28 MED ORDER — FLUCONAZOLE 150 MG PO TABS: ORAL_TABLET | ORAL | 0 refills | Status: DC

## 2016-06-28 MED FILL — FLUCONAZOLE 150 MG TABLET: 150 | 3 days supply | Qty: 2 | Fill #0

## 2016-06-28 NOTE — Telephone Encounter (Signed)
Called to let pt know that we received her FMLA papers but there was not reason as to why she needed them. Wanted to get clarification on what they were for. LVM for pt to call back with that information.

## 2016-06-28 NOTE — Telephone Encounter (Signed)
Pt needs FMLA paperwork filled out for when she was out with bronchitis. The dates are listed below.    10/16/,10/17 11/3/,11/10 12/6,12/7

## 2016-07-01 DIAGNOSIS — Z0289 Encounter for other administrative examinations: Secondary | ICD-10-CM

## 2016-07-01 MED FILL — DEXTROAMP-AMPHETAMINE 5 MG: 5 | 30 days supply | Qty: 30 | Fill #0

## 2016-07-01 MED FILL — ALPRAZolam 0.25 MG TABS: 0.25 | 30 days supply | Qty: 30 | Fill #1

## 2016-07-01 MED FILL — AMPHETAMINE SALTS 30 MG TAB: 30 | 30 days supply | Qty: 30 | Fill #0

## 2016-07-01 NOTE — Telephone Encounter (Signed)
FMLA paper has been faxed

## 2016-07-02 ENCOUNTER — Other Ambulatory Visit: Payer: Self-pay | Admitting: *Deleted

## 2016-07-02 MED ORDER — TRAZODONE HCL 100 MG PO TABS: 300.0000 mg | ORAL_TABLET | Freq: Every day | ORAL | 0 refills | Status: DC

## 2016-07-16 MED FILL — traZODone HCL 100 MG TABS: 100 | 30 days supply | Qty: 90 | Fill #0

## 2016-07-23 ENCOUNTER — Ambulatory Visit (INDEPENDENT_AMBULATORY_CARE_PROVIDER_SITE_OTHER): Payer: 59 | Admitting: Family

## 2016-07-23 ENCOUNTER — Encounter: Payer: Self-pay | Admitting: Family

## 2016-07-23 DIAGNOSIS — J069 Acute upper respiratory infection, unspecified: Secondary | ICD-10-CM

## 2016-07-23 DIAGNOSIS — F988 Other specified behavioral and emotional disorders with onset usually occurring in childhood and adolescence: Secondary | ICD-10-CM

## 2016-07-23 DIAGNOSIS — G479 Sleep disorder, unspecified: Secondary | ICD-10-CM | POA: Diagnosis not present

## 2016-07-23 DIAGNOSIS — F411 Generalized anxiety disorder: Secondary | ICD-10-CM | POA: Diagnosis not present

## 2016-07-23 MED ORDER — AMPHETAMINE-DEXTROAMPHETAMINE 10 MG PO TABS: 10.0000 mg | ORAL_TABLET | Freq: Every day | ORAL | 0 refills | Status: DC | PRN

## 2016-07-23 MED ORDER — AMPHETAMINE-DEXTROAMPHETAMINE 30 MG PO TABS: 30.0000 mg | ORAL_TABLET | Freq: Every day | ORAL | 0 refills | Status: DC

## 2016-07-23 MED ORDER — AMPHETAMINE-DEXTROAMPHETAMINE 30 MG PO TABS
30.0000 mg | ORAL_TABLET | Freq: Every day | ORAL | 0 refills | Status: DC
Start: 2016-07-23 — End: 2016-07-23

## 2016-07-23 MED ORDER — TRAZODONE HCL 100 MG PO TABS: 300.0000 mg | ORAL_TABLET | Freq: Every day | ORAL | 0 refills | Status: DC

## 2016-07-23 MED ORDER — ALPRAZOLAM 0.25 MG PO TABS: ORAL_TABLET | ORAL | 1 refills | Status: DC

## 2016-07-23 NOTE — Progress Notes (Signed)
Subjective:    Patient ID: Cynthia Townsend, female    DOB: [birth year], 36 y.o.   MRN: [id number]  Chief Complaint  Patient presents with  . Medication Refill    xanax, adderall, trazodone, sinus infection    HPI:  Cynthia Townsend is a 36 y.o. female who  has a past medical history of ADD (attention deficit disorder); Anxiety; Chicken pox; Classic migraine with aura (09/20/2015); Depression; Ectopic pregnancy; Insomnia; Nasal polyps; Pregnancy induced hypertension; Sciatic pain; Subclinical hypothyroidism; and Vitamin D deficiency. and presents today for an office visit.  1.) ADHD -  Currently maintained on Adderall and reports taking the medication as prescribed and denies adverse side effects. Concentration and symptoms are generally well controlled but notes the afternoon dose tends to have small to little effect.  Sleeping approximately 6 hours per night. No significant changes to appetite or weight. No headaches, chest pain, shortness of breath, or heart palpitations.   2.) Anxiety - currently maintained on alprazolam. Reports taking medication as prescribed and denies adverse side effects. Symptoms are generally well controlled with current dosage of alprazolam.  3.) Sinus / cold symptoms - This is a new problem. Associated symptoms of cough, congestion and some chest pain when she breaths in have been going on for about 2 days. Denies fevers.Does have some shortness of breath with activity.  Modifying factors Mucinex, Tylenol and ibuprofen which have helped with her symptoms.   4.) Sleep disturbance - currently maintained on trazodone. Reports taken medications prescribed and denies adverse side effects. Currently sleeping approximately approximately 6 hours per night and feeling well rested in the morning.   Allergies  Allergen Reactions  . Penicillins Shortness Of Breath, Swelling and Other (See Comments)    Has patient had a PCN reaction causing immediate rash, facial/tongue/throat  swelling, SOB or lightheadedness with hypotension:Unknown Has patient had a PCN reaction causing severe rash involving mucus membranes or skin necrosis: unknown Has patient had a PCN reaction that required hospitalization unknown Has patient had a PCN reaction occurring within the last 10 years: uknown If all of the above answers are "NO", then may proceed with Cephalosporin use.     . Sulfa Antibiotics Hives and Rash      Outpatient Medications Prior to Visit  Medication Sig Dispense Refill  . butalbital-acetaminophen-caffeine (FIORICET, ESGIC) 50-325-40 MG tablet Take 1 tablet by mouth every 4 (four) hours as needed for headache. 30 tablet 1  . docusate sodium (STOOL SOFTENER) 100 MG capsule Take 100 mg by mouth 3 (three) times daily.     . miconazole (MICATIN) 2 % cream Apply 1 application topically 2 (two) times daily. 28.35 g 0  . norethindrone (MICRONOR,CAMILA,ERRIN) 0.35 MG tablet Reported on 11/30/2015  11  . rizatriptan (MAXALT-MLT) 10 MG disintegrating tablet Take 1 tablet (10 mg total) by mouth as needed for migraine. May repeat in 2 hours if needed 10 tablet 6  . ALPRAZolam (XANAX) 0.25 MG tablet TAKE 1 TABLET BY MOUTH ONCE DAILY AS NEEDED FOR ANXIETY 30 tablet 1  . amphetamine-dextroamphetamine (ADDERALL) 30 MG tablet Take 1 tablet by mouth daily. 30 tablet 0  . amphetamine-dextroamphetamine (ADDERALL) 5 MG tablet Take 1 tablet (5 mg total) by mouth daily as needed. 30 tablet 0  . HYDROcodone-homatropine (HYCODAN) 5-1.5 MG/5ML syrup Take 5 mLs by mouth every 6 (six) hours as needed for cough. 120 mL 0  . traZODone (DESYREL) 100 MG tablet Take 3 tablets (300 mg total) by mouth at bedtime. 90 tablet 0  .  chlorpheniramine-HYDROcodone (TUSSIONEX PENNKINETIC ER) 10-8 MG/5ML SUER Take 5 mLs by mouth at bedtime as needed. 115 mL 0  . doxycycline (VIBRA-TABS) 100 MG tablet Take 1 tablet (100 mg total) by mouth 2 (two) times daily. 20 tablet 0  . fluconazole (DIFLUCAN) 150 MG tablet  Take 1 tablet by mouth for one day and may repeat in 72 hours if needed. 2 tablet 0  . fluticasone furoate-vilanterol (BREO ELLIPTA) 100-25 MCG/INH AEPB Inhale 1 puff into the lungs daily. 28 each 0  . levofloxacin (LEVAQUIN) 500 MG tablet Take 1 tablet (500 mg total) by mouth daily. 10 tablet 0  . propranolol (INDERAL) 20 MG tablet Take 1 tablet (20 mg total) by mouth 2 (two) times daily. 180 tablet 2  . triamcinolone (KENALOG) 0.025 % cream Apply 1 application topically 2 (two) times daily. 30 g 0   No facility-administered medications prior to visit.       Past Surgical History:  Procedure Laterality Date  . BREAST BIOPSY     benign  . DILATION AND EVACUATION N/A 09/04/2015   Procedure: DILATATION AND EVACUATION;  Surgeon: Mitchel Honour, DO;  Location: [facility];  Service: Gynecology;  Laterality: N/A;      Past Medical History:  Diagnosis Date  . ADD (attention deficit disorder)   . Anxiety   . Chicken pox   . Classic migraine with aura 09/20/2015  . Depression   . Ectopic pregnancy   . Insomnia   . Nasal polyps   . Pregnancy induced hypertension   . Sciatic pain   . Subclinical hypothyroidism   . Vitamin D deficiency       Review of Systems  Constitutional: Negative for activity change, appetite change, chills, fever and unexpected weight change.  HENT: Positive for congestion. Negative for ear pain and sore throat.   Respiratory: Positive for cough, chest tightness and shortness of breath.   Cardiovascular: Negative for chest pain, palpitations and leg swelling.  Neurological: Negative for dizziness, weakness and headaches.  Psychiatric/Behavioral: Negative for decreased concentration, sleep disturbance and suicidal ideas. The patient is not nervous/anxious.       Objective:    BP 118/72 (BP Location: Left Arm, Patient Position: Sitting, Cuff Size: Large)   Pulse 88   Temp 98.2 F (36.8 C) (Oral)   Resp 16   Ht 5\' 3"  (1.6 m)   Wt 187 lb (84.8 kg)   SpO2 98%    BMI 33.13 kg/m  Nursing note and vital signs reviewed.  Physical Exam  Constitutional: She is oriented to person, place, and time. She appears well-developed and well-nourished. No distress.  HENT:  Right Ear: Hearing, tympanic membrane, external ear and ear canal normal.  Left Ear: Hearing, tympanic membrane, external ear and ear canal normal.  Nose: Nose normal. Right sinus exhibits no maxillary sinus tenderness and no frontal sinus tenderness. Left sinus exhibits no maxillary sinus tenderness and no frontal sinus tenderness.  Mouth/Throat: Uvula is midline, oropharynx is clear and moist and mucous membranes are normal.  Cardiovascular: Normal rate, regular rhythm, normal heart sounds and intact distal pulses.   Pulmonary/Chest: Effort normal and breath sounds normal.  Neurological: She is alert and oriented to person, place, and time.  Skin: Skin is warm and dry.  Psychiatric: She has a normal mood and affect. Her behavior is normal. Judgment and thought content normal.       Assessment & Plan:   Problem List Items Addressed This Visit      Respiratory  Acute upper respiratory infection    Symptoms and exam consistent with acute upper respiratory infection most likely viral. Continue over-the-counter medications as needed for symptom relief and supportive care. Follow-up if symptoms worsen or do not improve.        Other   ADD (attention deficit disorder)    ADD appears stable with current regimen although notes decreased control of symptoms later in the afternoons with secondary dose. Increase Adderall to 10 mg in the afternoon and continue current dosage of 30 mg of Adderall daily. Sleeping and eating well with no significant appetite or weight change. No cardiac symptoms. [place] controlled substance database reviewed with no irregularities. Continue to monitor.      Relevant Medications   amphetamine-dextroamphetamine (ADDERALL) 30 MG tablet    amphetamine-dextroamphetamine (ADDERALL) 10 MG tablet   Generalized anxiety disorder    Generalized anxiety disorder appears adequately controlled current medication no adverse side effects. Continue current dosage of alprazolam. Continue to monitor.      Relevant Medications   ALPRAZolam (XANAX) 0.25 MG tablet   Sleep disturbance    Stable with current dosage of trazodone sleeping approximately 6 hours per night. Continue current dosage of trazodone. Continue to monitor.          I have discontinued Ms. Kratz's propranolol, triamcinolone, chlorpheniramine-HYDROcodone, HYDROcodone-homatropine, levofloxacin, fluticasone furoate-vilanterol, doxycycline, and fluconazole. I am also having her maintain her docusate sodium, norethindrone, butalbital-acetaminophen-caffeine, rizatriptan, miconazole, amphetamine-dextroamphetamine, amphetamine-dextroamphetamine, traZODone, and ALPRAZolam.   Meds ordered this encounter  Medications  . DISCONTD: amphetamine-dextroamphetamine (ADDERALL) 30 MG tablet    Sig: Take 1 tablet by mouth daily.    Dispense:  30 tablet    Refill:  0    Fill on or after 07/31/16    Order Specific Question:   Supervising Provider    Answer:   Hillard DankerRAWFORD, ELIZABETH A [[id number]]  . DISCONTD: amphetamine-dextroamphetamine (ADDERALL) 10 MG tablet    Sig: Take 1 tablet (10 mg total) by mouth daily as needed.    Dispense:  30 tablet    Refill:  0    Fill on or after 07/31/16    Order Specific Question:   Supervising Provider    Answer:   Hillard DankerRAWFORD, ELIZABETH A [[id number]]  . DISCONTD: amphetamine-dextroamphetamine (ADDERALL) 30 MG tablet    Sig: Take 1 tablet by mouth daily.    Dispense:  30 tablet    Refill:  0    Fill on or after 08/30/2016    Order Specific Question:   Supervising Provider    Answer:   Hillard DankerRAWFORD, ELIZABETH A [[id number]]  . DISCONTD: amphetamine-dextroamphetamine (ADDERALL) 10 MG tablet    Sig: Take 1 tablet (10 mg total) by mouth daily as needed.    Dispense:  30  tablet    Refill:  0    Fill on or after 08/30/16    Order Specific Question:   Supervising Provider    Answer:   Hillard DankerRAWFORD, ELIZABETH A [[id number]]  . amphetamine-dextroamphetamine (ADDERALL) 30 MG tablet    Sig: Take 1 tablet by mouth daily.    Dispense:  30 tablet    Refill:  0    Fill on or after 09/29/2016    Order Specific Question:   Supervising Provider    Answer:   Hillard DankerRAWFORD, ELIZABETH A [[id number]]  . amphetamine-dextroamphetamine (ADDERALL) 10 MG tablet    Sig: Take 1 tablet (10 mg total) by mouth daily as needed.    Dispense:  30 tablet    Refill:  0  Fill on or after 09/29/16    Order Specific Question:   Supervising Provider    Answer:   Hillard Danker A [[id number]]  . traZODone (DESYREL) 100 MG tablet    Sig: Take 3 tablets (300 mg total) by mouth at bedtime.    Dispense:  90 tablet    Refill:  0    Order Specific Question:   Supervising Provider    Answer:   Hillard Danker A [[id number]]  . ALPRAZolam (XANAX) 0.25 MG tablet    Sig: TAKE 1 TABLET BY MOUTH ONCE DAILY AS NEEDED FOR ANXIETY    Dispense:  30 tablet    Refill:  1    Fill on or after 07/31/16    Order Specific Question:   Supervising Provider    Answer:   Hillard Danker A [[id number]]     Follow-up: Return in about 3 months (around 10/20/2016).  Jeanine Luz, FNP

## 2016-07-23 NOTE — Assessment & Plan Note (Signed)
Symptoms and exam consistent with acute upper respiratory infection most likely viral. Continue over-the-counter medications as needed for symptom relief and supportive care. Follow-up if symptoms worsen or do not improve. 

## 2016-07-23 NOTE — Assessment & Plan Note (Signed)
Generalized anxiety disorder appears adequately controlled current medication no adverse side effects. Continue current dosage of alprazolam. Continue to monitor.

## 2016-07-23 NOTE — Assessment & Plan Note (Signed)
Stable with current dosage of trazodone sleeping approximately 6 hours per night. Continue current dosage of trazodone. Continue to monitor.

## 2016-07-23 NOTE — Patient Instructions (Signed)
Thank you for choosing [facility].  SUMMARY AND INSTRUCTIONS:  Medication:  Your prescription(s) have been submitted to your pharmacy or been printed and provided for you. Please take as directed and contact our office if you believe you are having problem(s) with the medication(s) or have any questions.  Follow up:  If your symptoms worsen or fail to improve, please contact our office for further instruction, or in case of emergency go directly to the emergency room at the closest medical facility.    General Recommendations:    Please drink plenty of fluids.  Get plenty of rest   Sleep in humidified air  Use saline nasal sprays  Netti pot   OTC Medications:  Decongestants - helps relieve congestion   Flonase (generic fluticasone) or Nasacort (generic triamcinolone) - please make sure to use the "cross-over" technique at a 45 degree angle towards the opposite eye as opposed to straight up the nasal passageway.   Sudafed (generic pseudoephedrine - Note this is the one that is available behind the pharmacy counter); Products with phenylephrine (-PE) may also be used but is often not as effective as pseudoephedrine.   If you have HIGH BLOOD PRESSURE - Coricidin HBP; AVOID any product that is -D as this contains pseudoephedrine which may increase your blood pressure.  Afrin (oxymetazoline) every 6-8 hours for up to 3 days.   Allergies - helps relieve runny nose, itchy eyes and sneezing   Claritin (generic loratidine), Allegra (fexofenidine), or Zyrtec (generic cyrterizine) for runny nose. These medications should not cause drowsiness.  Note - Benadryl (generic diphenhydramine) may be used however may cause drowsiness  Cough -   Delsym or Robitussin (generic dextromethorphan)  Expectorants - helps loosen mucus to ease removal   Mucinex (generic guaifenesin) as directed on the package.  Headaches / General Aches   Tylenol (generic acetaminophen) - DO NOT  EXCEED 3 grams (3,000 mg) in a 24 hour time period  Advil/Motrin (generic ibuprofen)   Sore Throat -   Salt water gargle   Chloraseptic (generic benzocaine) spray or lozenges / Sucrets (generic dyclonine)     

## 2016-07-23 NOTE — Assessment & Plan Note (Signed)
ADD appears stable with current regimen although notes decreased control of symptoms later in the afternoons with secondary dose. Increase Adderall to 10 mg in the afternoon and continue current dosage of 30 mg of Adderall daily. Sleeping and eating well with no significant appetite or weight change. No cardiac symptoms. [place] controlled substance database reviewed with no irregularities. Continue to monitor.

## 2016-07-25 ENCOUNTER — Telehealth: Payer: Self-pay | Admitting: Family

## 2016-07-25 NOTE — Telephone Encounter (Signed)
Wanted to know if [employer] forms were received and faxed back.  States [employer] did not get back.

## 2016-07-26 NOTE — Telephone Encounter (Signed)
FMLA forms refaxed

## 2016-08-02 MED FILL — ALPRAZolam 0.25 MG TABS: 0.25 | 30 days supply | Qty: 30 | Fill #0

## 2016-08-02 MED FILL — DEXTROAMP-AMPHETAMIN 10 MG: 10 | 30 days supply | Qty: 30 | Fill #0

## 2016-08-02 MED FILL — AMPHETAMINE SALTS 30 MG TAB: 30 | 30 days supply | Qty: 30 | Fill #0

## 2016-08-09 MED FILL — traZODone HCL 100 MG TABS: 100 | 30 days supply | Qty: 90 | Fill #0

## 2016-08-21 ENCOUNTER — Ambulatory Visit (INDEPENDENT_AMBULATORY_CARE_PROVIDER_SITE_OTHER): Payer: 59 | Admitting: Adult Health

## 2016-08-21 VITALS — BP 120/74 | Temp 97.7°F | Wt 186.6 lb

## 2016-08-21 DIAGNOSIS — J020 Streptococcal pharyngitis: Secondary | ICD-10-CM

## 2016-08-21 LAB — POCT RAPID STREP A (OFFICE): RAPID STREP A SCREEN: POSITIVE — AB

## 2016-08-21 MED ORDER — CEPHALEXIN 500 MG PO CAPS: 500.0000 mg | ORAL_CAPSULE | Freq: Two times a day (BID) | ORAL | 0 refills | Status: DC

## 2016-08-21 MED ORDER — MAGIC MOUTHWASH W/LIDOCAINE: 5.0000 mL | Freq: Three times a day (TID) | ORAL | 0 refills | Status: DC | PRN

## 2016-08-21 MED ORDER — PREDNISONE 10 MG PO TABS: 10.0000 mg | ORAL_TABLET | Freq: Every day | ORAL | 0 refills | Status: DC

## 2016-08-21 MED FILL — CEPHALEXIN 500 MG CAPSULE: 500 | 10 days supply | Qty: 20 | Fill #0

## 2016-08-21 MED FILL — predniSONE 10 MG TABS: 10 | 5 days supply | Qty: 5 | Fill #0

## 2016-08-21 NOTE — Progress Notes (Signed)
Subjective:    Patient ID: Cynthia Townsend, female    DOB: 08/05/1980, 36 y.o.   MRN: [id number]  HPI  36 year old female who presents to the office today for sore throat. She has had a sore throat for three days. She reports that she has had trouble swallowing, has slight nausea and swollen lymph nodes. She is also feeling fatigued.   Denies vomiting or diarrhea.   Review of Systems Negative unless mentioned above   Past Medical History:  Diagnosis Date  . ADD (attention deficit disorder)   . Anxiety   . Chicken pox   . Classic migraine with aura 09/20/2015  . Depression   . Ectopic pregnancy   . Insomnia   . Nasal polyps   . Pregnancy induced hypertension   . Sciatic pain   . Subclinical hypothyroidism   . Vitamin D deficiency     Social History   Social History  . Marital status: Single    Spouse name: N/A  . Number of children: 0  . Years of education: 4   Occupational History  . 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 Smoking status: Former Smoker    Types: Cigarettes  . Smokeless tobacco: Never Used  . Alcohol use No  . Drug use: No  . Sexual activity: Yes    Birth control/ protection: None   Other Topics Concern  . Not on file   Social History Narrative   Lives at home w/ her fiance and new daughter.   Right-handed.   Drinks about 1-2 cups of caffeine per day.   Fun: Hiking, Zumba, sleep   Denies religious beliefs effecting health care.   Denies abuse and feels safe at home.     Past Surgical History:  Procedure Laterality Date  . BREAST BIOPSY     benign  . DILATION AND EVACUATION N/A 09/04/2015   Procedure: DILATATION AND EVACUATION;  Surgeon: Mitchel Honour, DO;  Location: [facility];  Service: Gynecology;  Laterality: N/A;    Family History  Problem Relation Age of Onset  . Alcohol abuse Mother   . Lung cancer Mother   . Hyperlipidemia Mother   . Heart disease Mother   . Mental illness Mother   . Hyperthyroidism Mother   .  COPD Mother   . Hyperlipidemia Father   . Heart disease Father   . Transient ischemic attack Father   . Pulmonary embolism Father   . Heart disease Sister   . Mental illness Sister   . Rheum arthritis Maternal Grandmother   . Heart disease Maternal Uncle     Allergies  Allergen Reactions  . Penicillins Shortness Of Breath, Swelling and Other (See Comments)    Has patient had a PCN reaction causing immediate rash, facial/tongue/throat swelling, SOB or lightheadedness with hypotension:Unknown Has patient had a PCN reaction causing severe rash involving mucus membranes or skin necrosis: unknown Has patient had a PCN reaction that required hospitalization unknown Has patient had a PCN reaction occurring within the last 10 years: uknown If all of the above answers are "NO", then may proceed with Cephalosporin use.     . Sulfa Antibiotics Hives and Rash    Current Outpatient Prescriptions on File Prior to Visit  Medication Sig Dispense Refill  . ALPRAZolam (XANAX) 0.25 MG tablet TAKE 1 TABLET BY MOUTH ONCE DAILY AS NEEDED FOR ANXIETY 30 tablet 1  . amphetamine-dextroamphetamine (ADDERALL) 10 MG tablet Take 1 tablet (  10 mg total) by mouth daily as needed. 30 tablet 0  . amphetamine-dextroamphetamine (ADDERALL) 30 MG tablet Take 1 tablet by mouth daily. 30 tablet 0  . butalbital-acetaminophen-caffeine (FIORICET, ESGIC) 50-325-40 MG tablet Take 1 tablet by mouth every 4 (four) hours as needed for headache. 30 tablet 1  . docusate sodium (STOOL SOFTENER) 100 MG capsule Take 100 mg by mouth 3 (three) times daily.     . miconazole (MICATIN) 2 % cream Apply 1 application topically 2 (two) times daily. 28.35 g 0  . norethindrone (MICRONOR,CAMILA,ERRIN) 0.35 MG tablet Reported on 11/30/2015  11  . rizatriptan (MAXALT-MLT) 10 MG disintegrating tablet Take 1 tablet (10 mg total) by mouth as needed for migraine. May repeat in 2 hours if needed 10 tablet 6  . traZODone (DESYREL) 100 MG tablet Take 3  tablets (300 mg total) by mouth at bedtime. 90 tablet 0   No current facility-administered medications on file prior to visit.     BP 120/74   Temp 97.7 F (36.5 C)   Wt 186 lb 9.6 oz (84.6 kg)   BMI 33.05 kg/m       Objective:   Physical Exam  Constitutional: She is oriented to person, place, and time. She appears well-developed and well-nourished. No distress.  HENT:  Right Ear: Hearing, tympanic membrane, external ear and ear canal normal.  Left Ear: Hearing, tympanic membrane, external ear and ear canal normal.  Nose: Nose normal. Right sinus exhibits no maxillary sinus tenderness and no frontal sinus tenderness. Left sinus exhibits no maxillary sinus tenderness and no frontal sinus tenderness.  Mouth/Throat: Uvula is midline and mucous membranes are normal. Oropharyngeal exudate, posterior oropharyngeal edema and posterior oropharyngeal erythema present. No tonsillar abscesses.  Neck: Normal range of motion. Neck supple.  Cardiovascular: Normal rate, regular rhythm and intact distal pulses.  Exam reveals no gallop and no friction rub.   No murmur heard. Pulmonary/Chest: Effort normal and breath sounds normal. No respiratory distress. She has no wheezes. She has no rales. She exhibits no tenderness.  Lymphadenopathy:    She has cervical adenopathy.  Neurological: She is alert and oriented to person, place, and time.  Skin: Skin is warm and dry. No rash noted. She is not diaphoretic. No erythema. No pallor.  Psychiatric: She has a normal mood and affect. Her behavior is normal. Judgment and thought content normal.  Nursing note and vitals reviewed.     Assessment & Plan:  1. Strep throat  - POC Rapid Strep A- positive  - cephALEXin (KEFLEX) 500 MG capsule; Take 1 capsule (500 mg total) by mouth 2 (two) times daily.  Dispense: 20 capsule; Refill: 0 - predniSONE (DELTASONE) 10 MG tablet; Take 1 tablet (10 mg total) by mouth daily with breakfast.  Dispense: 5 tablet; Refill:  0 - magic mouthwash w/lidocaine SOLN; Take 5 mLs by mouth 3 (three) times daily as needed. Gargle and spit  Dispense: 180 mL; Refill: 0 - Follow up if no improvement or if symptoms become worse   Shirline Freesory Thanya Cegielski, NP

## 2016-08-23 ENCOUNTER — Other Ambulatory Visit: Payer: Self-pay | Admitting: Family

## 2016-08-23 DIAGNOSIS — Z1231 Encounter for screening mammogram for malignant neoplasm of breast: Secondary | ICD-10-CM

## 2016-08-30 ENCOUNTER — Ambulatory Visit: Payer: 59 | Admitting: Nurse Practitioner

## 2016-08-31 ENCOUNTER — Encounter: Payer: Self-pay | Admitting: *Deleted

## 2016-08-31 ENCOUNTER — Emergency Department (INDEPENDENT_AMBULATORY_CARE_PROVIDER_SITE_OTHER): Payer: 59

## 2016-08-31 ENCOUNTER — Emergency Department
Admission: EM | Admit: 2016-08-31 | Discharge: 2016-08-31 | Disposition: A | Discharge status: Home / Self Care | Payer: 59 | Source: Home / Self Care | Attending: Family Medicine | Admitting: Family Medicine

## 2016-08-31 DIAGNOSIS — J069 Acute upper respiratory infection, unspecified: Secondary | ICD-10-CM

## 2016-08-31 DIAGNOSIS — B9789 Other viral agents as the cause of diseases classified elsewhere: Secondary | ICD-10-CM

## 2016-08-31 DIAGNOSIS — R05 Cough: Secondary | ICD-10-CM | POA: Diagnosis not present

## 2016-08-31 DIAGNOSIS — R0602 Shortness of breath: Secondary | ICD-10-CM

## 2016-08-31 DIAGNOSIS — J9801 Acute bronchospasm: Secondary | ICD-10-CM

## 2016-08-31 DIAGNOSIS — R079 Chest pain, unspecified: Secondary | ICD-10-CM | POA: Diagnosis not present

## 2016-08-31 LAB — POCT INFLUENZA A/B
INFLUENZA B, POC: NEGATIVE
Influenza A, POC: NEGATIVE

## 2016-08-31 MED ORDER — PREDNISONE 20 MG PO TABS: ORAL_TABLET | ORAL | 0 refills | Status: DC

## 2016-08-31 MED ORDER — GUAIFENESIN-CODEINE 100-10 MG/5ML PO SOLN: ORAL | 0 refills | Status: DC

## 2016-08-31 MED ORDER — DOXYCYCLINE HYCLATE 100 MG PO CAPS: 100.0000 mg | ORAL_CAPSULE | Freq: Two times a day (BID) | ORAL | 0 refills | Status: DC

## 2016-08-31 NOTE — Discharge Instructions (Signed)
Take plain guaifenesin (1200mg  extended release tabs such as Mucinex) twice daily, with plenty of water, for cough and congestion.  Get adequate rest.   May use Afrin nasal spray (or generic oxymetazoline) each morning for about 5 days and then discontinue.  Also recommend using saline nasal spray several times daily and saline nasal irrigation (AYR is a common brand).  Use Flonase nasal spray each morning after using Afrin nasal spray and saline nasal irrigation. Try warm salt water gargles for sore throat.  Stop all antihistamines for now, and other non-prescription cough/cold preparations. Begin doxycycline if not improving about one week or if persistent fever develops

## 2016-08-31 NOTE — ED Triage Notes (Signed)
Patient c/o dry cough x yesterday with exception of this AM. She is SOB, fatigued and c/o HA, CP when inhaling. Afebrile. Taken Day/Nyquil. Finished antibiotic for strep throat yesterday. Pt had pneumonia in 06/2016 and bronchitis x 3 last year. No h/o asthma.

## 2016-08-31 NOTE — ED Provider Notes (Signed)
[facility]    CSN: [id number] Arrival date & time: 08/31/16  1655     History   Chief Complaint Chief Complaint  Patient presents with  . Cough  . Shortness of Breath  . Weakness    HPI Cynthia Townsend is a 36 y.o. female.   Yesterday patient suddenly developed cough, sneezing, sinus congestion, minimal sore throat, fatigue, and headache.  No fever.  She has felt tight in her anterior chest.  She had an episode of C.diff colitis earlier this year.  She has a family history of asthma (mother).   The history is provided by the patient.    Past Medical History:  Diagnosis Date  . ADD (attention deficit disorder)   . Anxiety   . Chicken pox   . Classic migraine with aura 09/20/2015  . Depression   . Ectopic pregnancy   . Insomnia   . Nasal polyps   . Pregnancy induced hypertension   . Sciatic pain   . Subclinical hypothyroidism   . Vitamin D deficiency     Patient Active Problem List   Diagnosis Date Noted  . Acute upper respiratory infection 07/23/2016  . Acute sinusitis 05/20/2016  . Cough 04/03/2016  . Sleep disturbance 02/29/2016  . Classic migraine with aura 09/20/2015  . Shortness of breath 09/04/2015  . PIH (pregnancy induced hypertension) 08/31/2015  . Tenosynovitis of thumb 04/06/2015  . Irregular menstrual cycle 12/29/2014  . Nasal polyps 12/29/2014  . ADD (attention deficit disorder) 12/29/2014  . Vitamin D deficiency 12/29/2014  . Generalized anxiety disorder 12/29/2014    Past Surgical History:  Procedure Laterality Date  . BREAST BIOPSY     benign  . DILATION AND EVACUATION N/A 09/04/2015   Procedure: DILATATION AND EVACUATION;  Surgeon: Mitchel Honour, DO;  Location: [facility];  Service: Gynecology;  Laterality: N/A;    OB History    Gravida Para Term Preterm AB Living   2 1 1  0 1 1   SAB TAB Ectopic Multiple Live Births   0 0 1 0 1       Home Medications    Prior to Admission medications   Medication Sig Start Date End  Date Taking? Authorizing Provider  ALPRAZolam (XANAX) 0.25 MG tablet TAKE 1 TABLET BY MOUTH ONCE DAILY AS NEEDED FOR ANXIETY 07/23/16   Veryl Speak, FNP  amphetamine-dextroamphetamine (ADDERALL) 10 MG tablet Take 1 tablet (10 mg total) by mouth daily as needed. 07/23/16   Veryl Speak, FNP  amphetamine-dextroamphetamine (ADDERALL) 30 MG tablet Take 1 tablet by mouth daily. 07/23/16   Veryl Speak, FNP  butalbital-acetaminophen-caffeine (FIORICET, ESGIC) [phone] MG tablet Take 1 tablet by mouth every 4 (four) hours as needed for headache. 11/30/15   Nilda Riggs, NP  docusate sodium (STOOL SOFTENER) 100 MG capsule Take 100 mg by mouth 3 (three) times daily.     Historical Provider, MD  doxycycline (VIBRAMYCIN) 100 MG capsule Take 1 capsule (100 mg total) by mouth 2 (two) times daily. Take with food (Rx void after 09/07/16) 08/31/16   Lattie Haw, MD  guaiFENesin-codeine 100-10 MG/5ML syrup Take 10mL by mouth at bedtime as needed for cough 08/31/16   Lattie Haw, MD  magic mouthwash w/lidocaine SOLN Take 5 mLs by mouth 3 (three) times daily as needed. Gargle and spit 08/21/16   Shirline Frees, NP  miconazole (MICATIN) 2 % cream Apply 1 application topically 2 (two) times daily. 04/03/16   Veryl Speak, FNP  norethindrone (MICRONOR,CAMILA,ERRIN) 0.35 MG tablet Reported on 11/30/2015 10/12/15   Historical Provider, MD  predniSONE (DELTASONE) 20 MG tablet Take one tab by mouth twice daily for 5 days, then one daily. Take with food. 08/31/16   Lattie Haw, MD  rizatriptan (MAXALT-MLT) 10 MG disintegrating tablet Take 1 tablet (10 mg total) by mouth as needed for migraine. May repeat in 2 hours if needed 02/29/16   Nilda Riggs, NP  traZODone (DESYREL) 100 MG tablet Take 3 tablets (300 mg total) by mouth at bedtime. 07/23/16   Veryl Speak, FNP    Family History Family History  Problem Relation Age of Onset  . Alcohol abuse Mother   . Lung cancer Mother   .  Hyperlipidemia Mother   . Heart disease Mother   . Mental illness Mother   . Hyperthyroidism Mother   . COPD Mother   . Hyperlipidemia Father   . Heart disease Father   . Transient ischemic attack Father   . Pulmonary embolism Father   . Heart disease Sister   . Mental illness Sister   . Rheum arthritis Maternal Grandmother   . Heart disease Maternal Uncle     Social History Social History  Substance Use Topics  . Smoking status: Former Smoker    Types: Cigarettes  . Smokeless tobacco: Never Used  . Alcohol use No     Allergies   Penicillins and Sulfa antibiotics   Review of Systems Review of Systems + sore throat + cough + sneezing No pleuritic pain ? wheezing + nasal congestion + post-nasal drainage No sinus pain/pressure No itchy/red eyes ? earache No hemoptysis No SOB No fever, + chills No nausea No vomiting No abdominal pain No diarrhea No urinary symptoms No skin rash + fatigue No myalgias + headache Used OTC meds without relief   Physical Exam Triage Vital Signs ED Triage Vitals  Enc Vitals Group     BP 08/31/16 1709 128/74     Pulse Rate 08/31/16 1709 84     Resp 08/31/16 1709 (!) 22     Temp 08/31/16 1709 98.3 F (36.8 C)     Temp Source 08/31/16 1709 Oral     SpO2 08/31/16 1709 98 %     Weight 08/31/16 1709 188 lb (85.3 kg)     Height --      Head Circumference --      Peak Flow --      Pain Score 08/31/16 1713 0     Pain Loc --      Pain Edu? --      Excl. in GC? --    No data found.   Updated Vital Signs BP 128/74 (BP Location: Left Arm)   Pulse 84   Temp 98.3 F (36.8 C) (Oral)   Resp (!) 22   Wt 188 lb (85.3 kg)   LMP 08/31/2016   SpO2 98%   BMI 33.30 kg/m   Visual Acuity Right Eye Distance:   Left Eye Distance:   Bilateral Distance:    Right Eye Near:   Left Eye Near:    Bilateral Near:     Physical Exam Nursing notes and Vital Signs reviewed. Appearance:  Patient appears stated age, and in no acute  distress Eyes:  Pupils are equal, round, and reactive to light and accomodation.  Extraocular movement is intact.  Conjunctivae are not inflamed  Ears:  Canals normal.  Tympanic membranes normal.  Nose:  Mildly congested turbinates.  No sinus  tenderness.   Pharynx:  Normal Neck:  Supple.  Tender enlarged posterior/lateral nodes are palpated bilaterally  Lungs:  Faint anterior expiratory wheezes.  Breath sounds are equal.  Moving air well. Heart:  Regular rate and rhythm without murmurs, rubs, or gallops.  Abdomen:  Nontender without masses or hepatosplenomegaly.  Bowel sounds are present.  No CVA or flank tenderness.  Extremities:  No edema.  Skin:  No rash present.    UC Treatments / Results  Labs (all labs ordered are listed, but only abnormal results are displayed) Labs Reviewed  BRAIN NATRIURETIC PEPTIDE  POCT INFLUENZA A/B negative  POCT CBC W AUTO DIFF ([facility]):  WBC 9.3; LY 23.2; MO 4.4; GR 72.4; Hgb 12.2; Platelets 298     EKG  EKG Interpretation None       Radiology Dg Chest 2 View  Result Date: 08/31/2016 CLINICAL DATA:  Acute onset of cough and shortness of breath. Generalized weakness and central chest pain. Initial encounter. EXAM: CHEST  2 VIEW COMPARISON:  Chest radiograph performed 09/07/2015 FINDINGS: The lungs are well-aerated. Vascular congestion is noted. Mildly increased central markings may reflect minimal interstitial edema. There is no evidence of pleural effusion or pneumothorax. The heart is normal in size; the mediastinal contour is within normal limits. No acute osseous abnormalities are seen. IMPRESSION: Vascular congestion noted. Suggestion of minimal interstitial edema. Electronically Signed   By: Roanna RaiderJeffery  Chang M.D.   On: 08/31/2016 18:15    Procedures Procedures (including critical care time)  Medications Ordered in UC Medications - No data to display   Initial Impression / Assessment and Plan / UC Course  I have reviewed the  triage vital signs and the nursing notes.  Pertinent labs & imaging results that were available during my care of the patient were reviewed by me and considered in my medical decision making (see chart for details).    Concern for chest x-ray finding of vascular congestion and suggestion of minimal interstitial edema; will send BNP. There is no evidence of bacterial infection today.   Begin prednisone burst/taper.  Rx for Robitussin AC for night time cough.  Take plain guaifenesin (1200mg  extended release tabs such as Mucinex) twice daily, with plenty of water, for cough and congestion.  Get adequate rest.   May use Afrin nasal spray (or generic oxymetazoline) each morning for about 5 days and then discontinue.  Also recommend using saline nasal spray several times daily and saline nasal irrigation (AYR is a common brand).  Use Flonase nasal spray each morning after using Afrin nasal spray and saline nasal irrigation. Try warm salt water gargles for sore throat.  Stop all antihistamines for now, and other non-prescription cough/cold preparations. Begin doxycycline if not improving about one week or if persistent fever develops (Given a prescription to hold, with an expiration date)  Recommend follow-up with PCP in 3 to 4 days.    Final Clinical Impressions(s) / UC Diagnoses   Final diagnoses:  Viral URI with cough  Bronchospasm    New Prescriptions New Prescriptions   DOXYCYCLINE (VIBRAMYCIN) 100 MG CAPSULE    Take 1 capsule (100 mg total) by mouth 2 (two) times daily. Take with food (Rx void after 09/07/16)   GUAIFENESIN-CODEINE 100-10 MG/5ML SYRUP    Take 10mL by mouth at bedtime as needed for cough   PREDNISONE (DELTASONE) 20 MG TABLET    Take one tab by mouth twice daily for 5 days, then one daily. Take with food.  Lattie Haw, MD 09/01/16 2133

## 2016-09-01 LAB — BRAIN NATRIURETIC PEPTIDE: BRAIN NATRIURETIC PEPTIDE: 54.4 pg/mL (ref <100)

## 2016-09-02 ENCOUNTER — Telehealth: Payer: Self-pay | Admitting: *Deleted

## 2016-09-02 ENCOUNTER — Telehealth: Payer: Self-pay | Admitting: Emergency Medicine

## 2016-09-02 LAB — POCT CBC W AUTO DIFF (K'VILLE URGENT CARE)

## 2016-09-02 NOTE — Telephone Encounter (Signed)
LM BNP normal and to call back if she has any questions or concerns.

## 2016-09-02 NOTE — Telephone Encounter (Signed)
Encounter opened to add order from day of visit.

## 2016-09-03 ENCOUNTER — Other Ambulatory Visit: Payer: Self-pay

## 2016-09-03 ENCOUNTER — Other Ambulatory Visit: Payer: Self-pay | Admitting: Family

## 2016-09-03 ENCOUNTER — Ambulatory Visit (INDEPENDENT_AMBULATORY_CARE_PROVIDER_SITE_OTHER): Payer: 59 | Admitting: Cardiology

## 2016-09-03 ENCOUNTER — Ambulatory Visit (HOSPITAL_COMMUNITY): Payer: 59 | Attending: Cardiology

## 2016-09-03 ENCOUNTER — Other Ambulatory Visit: Payer: Self-pay | Admitting: *Deleted

## 2016-09-03 ENCOUNTER — Encounter: Payer: Self-pay | Admitting: Cardiology

## 2016-09-03 VITALS — BP 124/76 | HR 87 | Ht 63.0 in | Wt 186.0 lb

## 2016-09-03 DIAGNOSIS — J209 Acute bronchitis, unspecified: Secondary | ICD-10-CM

## 2016-09-03 DIAGNOSIS — J810 Acute pulmonary edema: Secondary | ICD-10-CM | POA: Diagnosis not present

## 2016-09-03 DIAGNOSIS — R0602 Shortness of breath: Secondary | ICD-10-CM

## 2016-09-03 MED ORDER — ALBUTEROL SULFATE (2.5 MG/3ML) 0.083% IN NEBU: 2.5000 mg | INHALATION_SOLUTION | RESPIRATORY_TRACT | 12 refills | Status: DC | PRN

## 2016-09-03 MED ORDER — ALBUTEROL SULFATE HFA 108 (90 BASE) MCG/ACT IN AERS: 2.0000 | INHALATION_SPRAY | Freq: Four times a day (QID) | RESPIRATORY_TRACT | 2 refills | Status: DC | PRN

## 2016-09-03 MED ORDER — FUROSEMIDE 40 MG PO TABS: 40.0000 mg | ORAL_TABLET | Freq: Every day | ORAL | 0 refills | Status: DC

## 2016-09-03 MED FILL — FUROSEMIDE 40 MG TABLET: 40 | 7 days supply | Qty: 7 | Fill #0

## 2016-09-03 MED FILL — VENTOLIN HFA 90 MCG INHALER: 108 (90 BAS | 25 days supply | Qty: 18 | Fill #0

## 2016-09-03 NOTE — Progress Notes (Signed)
Cardiology Office Note    Date:  09/03/2016   ID:  Cynthia Townsend, DOB 06/28/1980, MRN [id number]  PCP:  Jeanine Luzalone, Gregory, FNP  Cardiologist:   Tobias AlexanderKatarina Tollie Canada, MD    History of Present Illness:  Cynthia Townsend is a 36 y.o. 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 The patient had first problem after she delivered her baby last year when she was diagnosed with postpartum preeclampsia. She was admitted with acute pulmonary edema and diuresed. She was doing okay since then with mild lower extremity edema. Her problem started again in October of this year when she presented with fever and cough and was prescribed azithromycin for acute bronchitis this was followed by 3 similar episodes and she was prescribed Levaquin 3 times. On 08/21/2016 she was diagnosed with strep throat and was treated with Keflex. This last Saturday she went to the urgent care with significant shortness of breath and worsening lower extremity edema inability to lay flat and was given a prescription for doxycycline and prednisone for acute bronchitis with bronchospasm.  Past Medical History:  Diagnosis Date  . ADD (attention deficit disorder)   . Anxiety   . Chicken pox   . Classic migraine with aura 09/20/2015  . Depression   . Ectopic pregnancy   . Insomnia   . Nasal polyps   . Pregnancy induced hypertension   . Sciatic pain   . Subclinical hypothyroidism   . Vitamin D deficiency     Past Surgical History:  Procedure Laterality Date  . BREAST BIOPSY     benign  . DILATION AND EVACUATION N/A 09/04/2015   Procedure: DILATATION AND EVACUATION;  Surgeon: Mitchel HonourMegan Morris, DO;  Location: [facility];  Service: Gynecology;  Laterality: N/A;    Current Medications: Outpatient Medications Prior to Visit  Medication Sig Dispense Refill  . ALPRAZolam (XANAX) 0.25 MG tablet TAKE 1 TABLET BY MOUTH ONCE DAILY AS NEEDED FOR ANXIETY 30 tablet 1  .  amphetamine-dextroamphetamine (ADDERALL) 10 MG tablet Take 1 tablet (10 mg total) by mouth daily as needed. 30 tablet 0  . amphetamine-dextroamphetamine (ADDERALL) 30 MG tablet Take 1 tablet by mouth daily. 30 tablet 0  . butalbital-acetaminophen-caffeine (FIORICET, ESGIC) 50-325-40 MG tablet Take 1 tablet by mouth every 4 (four) hours as needed for headache. 30 tablet 1  . docusate sodium (STOOL SOFTENER) 100 MG capsule Take 100 mg by mouth 3 (three) times daily.     Marland Kitchen. guaiFENesin-codeine 100-10 MG/5ML syrup Take 10mL by mouth at bedtime as needed for cough 120 mL 0  . predniSONE (DELTASONE) 20 MG tablet Take one tab by mouth twice daily for 5 days, then one daily. Take with food. 14 tablet 0  . rizatriptan (MAXALT-MLT) 10 MG disintegrating tablet Take 1 tablet (10 mg total) by mouth as needed for migraine. May repeat in 2 hours if needed 10 tablet 6  . traZODone (DESYREL) 100 MG tablet Take 3 tablets (300 mg total) by mouth at bedtime. 90 tablet 0  . doxycycline (VIBRAMYCIN) 100 MG capsule Take 1 capsule (100 mg total) by mouth 2 (two) times daily. Take with food (Rx void after 09/07/16) (Patient not taking: Reported on 09/03/2016) 14 capsule 0  . miconazole (MICATIN) 2 % cream Apply 1 application topically 2 (two) times daily. (Patient not taking: Reported on 09/03/2016) 28.35 g 0  . norethindrone (MICRONOR,CAMILA,ERRIN) 0.35 MG tablet Reported on 11/30/2015  11  . magic  mouthwash w/lidocaine SOLN Take 5 mLs by mouth 3 (three) times daily as needed. Gargle and spit (Patient not taking: Reported on 09/03/2016) 180 mL 0   No facility-administered medications prior to visit.      Allergies:   Penicillins and Sulfa antibiotics   Social History   Social History  . Marital status: Single    Spouse name: N/A  . Number of children: 0  . Years of education: 4   Occupational History  . 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 Smoking status: Former Smoker    Types: Cigarettes  .  Smokeless tobacco: Never Used  . Alcohol use No  . Drug use: No  . Sexual activity: Yes    Birth control/ protection: None   Other Topics Concern  . None   Social History Narrative   Lives at home w/ her fiance and new daughter.   Right-handed.   Drinks about 1-2 cups of caffeine per day.   Fun: Hiking, Zumba, sleep   Denies religious beliefs effecting health care.   Denies abuse and feels safe at home.      Family History:  The patient's family history includes Alcohol abuse in her mother; COPD in her mother; Heart disease in her father, maternal uncle, mother, and sister; Hyperlipidemia in her father and mother; Hyperthyroidism in her mother; Lung cancer in her mother; Mental illness in her mother and sister; Pulmonary embolism in her father; Rheum arthritis in her maternal grandmother; Transient ischemic attack in her father.   ROS:   Please see the history of present illness.    ROS All other systems reviewed and are negative.   PHYSICAL EXAM:   VS:  BP 124/76   Pulse 87   Ht 5\' 3"  (1.6 m)   Wt 186 lb (84.4 kg)   LMP 08/31/2016   BMI 32.95 kg/m    GEN: Well nourished, well developed, in no acute distress  HEENT: normal  Neck: no JVD, carotid bruits, or masses Cardiac: RRR; no murmurs, rubs, or gallops,no edema  Respiratory:  Severe bee sting in all parts of the lungs bilaterally, normal work of breathing GI: soft, nontender, nondistended, + BS MS: no deformity or atrophy  Skin: warm and dry, no rash Neuro:  Alert and Oriented x 3, Strength and sensation are intact Psych: euthymic mood, full affect  Wt Readings from Last 3 Encounters:  09/03/16 186 lb (84.4 kg)  08/31/16 188 lb (85.3 kg)  08/21/16 186 lb 9.6 oz (84.6 kg)     Studies/Labs Reviewed:   EKG:  EKG is ordered today.  The ekg ordered today demonstrates Normal sinus rhythm normal EKG.  Recent Labs: 09/06/2015: ALT 26; BUN 12; Creatinine, Ser 0.47; Hemoglobin 8.0; Platelets 270; Potassium 3.6; Sodium  140 08/31/2016: Brain Natriuretic Peptide 54.4   Lipid Panel No results found for: CHOL, TRIG, HDL, CHOLHDL, VLDL, LDLCALC, LDLDIRECT  Additional studies/ records that were reviewed today include:   TTE: 09/07/2015 - Left ventricle: The cavity size was normal. Wall thickness was   normal. Systolic function was normal. The estimated ejection   fraction was in the range of 55% to 60%. Wall motion was normal;   there were no regional wall motion abnormalities. Doppler   parameters are consistent with abnormal left ventricular   relaxation (grade 1 diastolic dysfunction). The E/e&' ratio is   >15, suggesting elevated LV filling pressure. - Mitral valve: Mildly thickened leaflets . There was mild  regurgitation. - Left atrium: Moderately dilated at 41 ml/m2. - Tricuspid valve: There was trivial regurgitation. - Pulmonary arteries: PA peak pressure: 16 mm Hg (S). - Inferior vena cava: The vessel was normal in size. The   respirophasic diameter changes were in the normal range (>= 50%),   consistent with normal central venous pressure.  Impressions:  - LVEF 55-60%, normal wall thickness,normal wall motion, diastolic   dysfunction with elevated LV filling pressure, mild MR, moderate   LAE, trivial TR, normal RVSP.    ASSESSMENT:    1. Acute pulmonary edema (HCC)   2. Shortness of breath   3. Bronchospasm with bronchitis, acute      PLAN:  In order of problems listed above:  1. The patient has acute bronchospasm albuterol nebulizer, she is advised to complete her dose of prednisone 40 mg daily 5 days. We will refer her to pulmonary specialist for further evaluation for possible PFTs and potentially CT chest as her course has been prolonged now for 5 months. 2. The patient has evidence of interstitial edema on chest x-ray, she also has lower extremity edema and crackles on her lungs, her echocardiogram shows normal systolic and diastolic function, I assume that once her acute  respiratory issues are resolved she wants so any signs of heart failure. We'll start Lasix 40 mg daily 1 week followed with a BMP next week. She had normal creatinine on previous blood draws and normal BNP on Saturday, 08/31/2016.  Follow-up in one week.  Medication Adjustments/Labs and Tests Ordered: Current medicines are reviewed at length with the patient today.  Concerns regarding medicines are outlined above.  Medication changes, Labs and Tests ordered today are listed in the Patient Instructions below. Patient Instructions  Medication Instructions:   USE LASIX 40 MG ONCE DAILY FOR ONE WEEK   USE ALBUTEROL INHALER 2 PUFFS EVERY 6 HOURS AS NEEDED FOR SOB , WHEEZING, AND COUGHING   USE ALBUTEROL NEBULIZER 3 ML BY NEB EVERY 4 HOURS AS NEEDED FOR SOB, WHEEZING, AND COUGHING     Labwork:  NEXT WEEK TO CHECK--- A BMET     You have been referred to PULMONOLOGY FOR BRONCHOSPASM AND INTERSTITIAL EDEMA      Follow-Up:  AS NEEDED WITH DR Delton See  .     If you need a refill on your cardiac medications before your next appointment, please call your pharmacy.      Signed, Tobias Alexander, MD  09/03/2016 4:35 PM    [facility] [address] [place], [place], [place]  [address] Phone: [phone]; Fax: [phone]

## 2016-09-03 NOTE — Patient Instructions (Signed)
Medication Instructions:   USE LASIX 40 MG ONCE DAILY FOR ONE WEEK   USE ALBUTEROL INHALER 2 PUFFS EVERY 6 HOURS AS NEEDED FOR SOB , WHEEZING, AND COUGHING   USE ALBUTEROL NEBULIZER 3 ML BY NEB EVERY 4 HOURS AS NEEDED FOR SOB, WHEEZING, AND COUGHING     Labwork:  NEXT WEEK TO CHECK--- A BMET     You have been referred to PULMONOLOGY FOR BRONCHOSPASM AND INTERSTITIAL EDEMA      Follow-Up:  AS NEEDED WITH DR Delton SeeNELSON  .     If you need a refill on your cardiac medications before your next appointment, please call your pharmacy.

## 2016-09-04 ENCOUNTER — Ambulatory Visit (INDEPENDENT_AMBULATORY_CARE_PROVIDER_SITE_OTHER): Payer: 59 | Admitting: Pulmonary Disease

## 2016-09-04 ENCOUNTER — Encounter: Payer: Self-pay | Admitting: Pulmonary Disease

## 2016-09-04 VITALS — BP 124/78 | HR 95 | Ht 63.0 in | Wt 183.0 lb

## 2016-09-04 DIAGNOSIS — J189 Pneumonia, unspecified organism: Secondary | ICD-10-CM

## 2016-09-04 DIAGNOSIS — J849 Interstitial pulmonary disease, unspecified: Secondary | ICD-10-CM

## 2016-09-04 DIAGNOSIS — R0602 Shortness of breath: Secondary | ICD-10-CM | POA: Diagnosis not present

## 2016-09-04 LAB — NITRIC OXIDE: NITRIC OXIDE: 12

## 2016-09-04 MED ORDER — AZELASTINE-FLUTICASONE 137-50 MCG/ACT NA SUSP: 1.0000 | Freq: Every day | NASAL | 3 refills | Status: DC

## 2016-09-04 MED FILL — DEXTROAMP-AMPHETAMIN 10 MG: 10 | 30 days supply | Qty: 30 | Fill #0

## 2016-09-04 MED FILL — ALPRAZolam 0.25 MG TABS: 0.25 | 30 days supply | Qty: 30 | Fill #1

## 2016-09-04 MED FILL — traZODone HCL 100 MG TABS: 100 | 30 days supply | Qty: 90 | Fill #0

## 2016-09-04 MED FILL — AMPHETAMINE SALTS 30 MG TAB: 30 | 30 days supply | Qty: 30 | Fill #0

## 2016-09-04 NOTE — Progress Notes (Signed)
Cynthia Townsend    [id number]    [birth year]  Primary Care Physician:Calone, Earl Lites, FNP  Referring Physician: Veryl Speak, FNP [address] [place], [place] [address]  Chief complaint:  Consult for evaluation of dyspnea  HPI: Cynthia Townsend is a 36 year old with no significant past medical history. She's had episodes of recurrent bronchitis from October 2018. She was treated with antibiotics about 4 times and developed C. difficile after the last round. She had an episode of strep throat 2 weeks ago and got a course of Keflex. She was seen on 3/24 at urgent care for cough, sinus congestion, sore throat and marked dyspnea, chest tightness. She was started on prednisone with some improvement in symptoms. Chest x-ray at that time was suggestive of pulmonary edema but BNP was normal She had an evaluation at the cardiology office yesterday with an echocardiogram which was normal. She was started on Lasix and given albuterol inhaler.   She had an episode of pulmonary edema last year after the birth of her daughter in the setting of preeclampsia. She improved with diuresis. She has minimal smoking history but quit several years ago. 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 She lives at an old home with leaking bathrooms and ceiling. She thinks that there may be mold issues. She has dogs at home but is not sensitive. No birds,hot tubs at home. No other known exposures. She had some issues with acid reflux during her pregnancy but no symptoms of the past year after delivery.  Outpatient Encounter Prescriptions as of 09/04/2016  Medication Sig  . albuterol (PROVENTIL HFA;VENTOLIN HFA) 108 (90 Base) MCG/ACT inhaler Inhale 2 puffs into the lungs every 6 (six) hours as needed for wheezing or shortness of breath.  Marland Kitchen albuterol (PROVENTIL) (2.5 MG/3ML) 0.083% nebulizer solution Take 3 mLs (2.5 mg total) by nebulization every 4 (four) hours as needed for wheezing or shortness of  breath.  . ALPRAZolam (XANAX) 0.25 MG tablet TAKE 1 TABLET BY MOUTH ONCE DAILY AS NEEDED FOR ANXIETY  . amphetamine-dextroamphetamine (ADDERALL) 10 MG tablet Take 1 tablet (10 mg total) by mouth daily as needed. (Patient taking differently: Take 10 mg by mouth daily. )  . amphetamine-dextroamphetamine (ADDERALL) 30 MG tablet Take 1 tablet by mouth daily.  . butalbital-acetaminophen-caffeine (FIORICET, ESGIC) 50-325-40 MG tablet Take 1 tablet by mouth every 4 (four) hours as needed for headache.  . docusate sodium (STOOL SOFTENER) 100 MG capsule Take 100 mg by mouth 3 (three) times daily.   Marland Kitchen doxycycline (VIBRAMYCIN) 100 MG capsule Take 1 capsule (100 mg total) by mouth 2 (two) times daily. Take with food (Rx void after 09/07/16)  . furosemide (LASIX) 40 MG tablet Take 1 tablet (40 mg total) by mouth daily.  Marland Kitchen guaiFENesin-codeine 100-10 MG/5ML syrup Take 10mL by mouth at bedtime as needed for cough  . miconazole (MICATIN) 2 % cream Apply 1 application topically 2 (two) times daily.  . norethindrone (MICRONOR,CAMILA,ERRIN) 0.35 MG tablet Reported on 11/30/2015  . predniSONE (DELTASONE) 20 MG tablet Take one tab by mouth twice daily for 5 days, then one daily. Take with food.  . rizatriptan (MAXALT-MLT) 10 MG disintegrating tablet Take 1 tablet (10 mg total) by mouth as needed for migraine. May repeat in 2 hours if needed  . traZODone (DESYREL) 100 MG tablet TAKE 3 TABLETS BY MOUTH AT BEDTIME.   No facility-administered encounter medications on file as of 09/04/2016.  Allergies as of 09/04/2016 - Review Complete 09/03/2016  Allergen Reaction Noted  . Penicillins Shortness Of Breath, Swelling, and Other (See Comments) 08/22/2012  . Sulfa antibiotics Hives and Rash 08/22/2012    Past Medical History:  Diagnosis Date  . ADD (attention deficit disorder)   . Anxiety   . Chicken pox   . Classic migraine with aura 09/20/2015  . Depression   . Ectopic pregnancy   . Insomnia   . Nasal polyps   .  Pregnancy induced hypertension   . Sciatic pain   . Subclinical hypothyroidism   . Vitamin D deficiency     Past Surgical History:  Procedure Laterality Date  . BREAST BIOPSY     benign  . DILATION AND EVACUATION N/A 09/04/2015   Procedure: DILATATION AND EVACUATION;  Surgeon: Mitchel Honour, DO;  Location: [facility];  Service: Gynecology;  Laterality: N/A;  . DNC      Family History  Problem Relation Age of Onset  . Alcohol abuse Mother   . Lung cancer Mother   . Hyperlipidemia Mother   . Heart disease Mother   . Mental illness Mother   . Hyperthyroidism Mother   . COPD Mother   . Hyperlipidemia Father   . Heart disease Father   . Transient ischemic attack Father   . Pulmonary embolism Father   . Heart disease Sister   . Mental illness Sister   . Heart attack Sister   . Diabetes Sister   . Rheum arthritis Maternal Grandmother   . Heart disease Maternal Uncle     Social History   Social History  . Marital status: Single    Spouse name: N/A  . Number of children: 0  . Years of education: 46   Occupational History  . 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 Smoking status: Former Smoker    Types: Cigarettes  . Smokeless tobacco: Never Used  . Alcohol use No  . Drug use: No  . Sexual activity: Yes    Birth control/ protection: None   Other Topics Concern  . Not on file   Social History Narrative   Lives at home w/ her fiance and new daughter.   Right-handed.   Drinks about 1-2 cups of caffeine per day.   Fun: Hiking, Zumba, sleep   Denies religious beliefs effecting health care.   Denies abuse and feels safe at home.    Review of systems: Review of Systems  Constitutional: Negative for fever and chills.  HENT: Negative.   Eyes: Negative for blurred vision.  Respiratory: as per HPI  Cardiovascular: Negative for chest pain and palpitations.  Gastrointestinal: Negative for vomiting, diarrhea, blood per rectum. Genitourinary: Negative for  dysuria, urgency, frequency and hematuria.  Musculoskeletal: Negative for myalgias, back pain and joint pain.  Skin: Negative for itching and rash.  Neurological: Negative for dizziness, tremors, focal weakness, seizures and loss of consciousness.  Endo/Heme/Allergies: Negative for environmental allergies.  Psychiatric/Behavioral: Negative for depression, suicidal ideas and hallucinations.  All other systems reviewed and are negative.  Physical Exam: Blood pressure 124/78, pulse 95, height 5\' 3"  (1.6 m), weight 183 lb (83 kg), last menstrual period 08/31/2016, SpO2 96 %, currently breastfeeding. Gen:      No acute distress HEENT:  EOMI, sclera anicteric Neck:     No masses; no thyromegaly Lungs:    Clear to auscultation bilaterally; normal respiratory effort CV:  Regular rate and rhythm; no murmurs Abd:      + bowel sounds; soft, non-tender; no palpable masses, no distension Ext:    No edema; adequate peripheral perfusion Skin:      Warm and dry; no rash Neuro: alert and oriented x 3 Psych: normal mood and affect  Data Reviewed: Chest x-ray 09/04/15-mild congestive heart failure Chest x-ray 09/07/15-resolution of heart failure Chest x-ray 08/31/16-mild vascular congestion with minimal edema I have reviewed all the images personally.  Echo 09/07/15-EF 55-60 percent, diastolic dysfunction with elevated LV filling pressure, mild MR, moderate left atrial enlargement, trivial TR, normal RVSP. Echo 09/03/16-EF 60-65% and no diastolic dysfunction. Normal RV systolic function. Normal study  FENO 09/04/16- 12   Assessment:  Assessment for recurrent bronchitis She likely has reactive airways, asthma in the setting of upper airway cough syndrome from postnasal drip, allergic rhinitis. She will continue the prednisone taper and albuterol inhaler. I'll schedule her for full pulmonary function tests for further evaluation.  For her rhinitis, postnasal drip I will start antihistamine  chlorpheniramine and dymista nasal spray.  Abnormal CXR She has abnormal chest x-ray with interstitial markings. This is suggestive of pulmonary edema but her BNP and echo are normal. I wonder if she has any other interstitial process going on. I'll get a high-resolution CT of the chest for further evaluation.  Plan/Recommendations: - PFTs, high res CT - Start chlorpheniramine dymista nasal spray - Continue albuterol inhaler.  Chilton GreathousePraveen Janus Vlcek MD [facility] Pager [phone] 09/04/2016, 4:16 PM  CC: Veryl Speakalone, Gregory D, FNP

## 2016-09-04 NOTE — Patient Instructions (Addendum)
We will get lung function tests and high-resolution CT of the chest Chlorpheniramine 8 mg 3 times daily and dymista nasal spray Continue albuterol rescue inhaler Continue Lasix as prescribed  Return to clinic in 1-2 months.

## 2016-09-06 ENCOUNTER — Ambulatory Visit (INDEPENDENT_AMBULATORY_CARE_PROVIDER_SITE_OTHER)
Admission: RE | Admit: 2016-09-06 | Discharge: 2016-09-06 | Disposition: A | Discharge status: Home / Self Care | Payer: 59 | Source: Ambulatory Visit | Attending: Pulmonary Disease | Admitting: Pulmonary Disease

## 2016-09-06 DIAGNOSIS — J90 Pleural effusion, not elsewhere classified: Secondary | ICD-10-CM | POA: Diagnosis not present

## 2016-09-06 DIAGNOSIS — J189 Pneumonia, unspecified organism: Secondary | ICD-10-CM

## 2016-09-09 ENCOUNTER — Ambulatory Visit: Payer: 59 | Admitting: Nurse Practitioner

## 2016-09-09 ENCOUNTER — Ambulatory Visit
Admission: RE | Admit: 2016-09-09 | Discharge: 2016-09-09 | Disposition: A | Discharge status: Home / Self Care | Payer: 59 | Source: Ambulatory Visit | Attending: Family | Admitting: Family

## 2016-09-09 DIAGNOSIS — Z1231 Encounter for screening mammogram for malignant neoplasm of breast: Secondary | ICD-10-CM

## 2016-09-10 ENCOUNTER — Telehealth: Payer: Self-pay | Admitting: Pulmonary Disease

## 2016-09-10 ENCOUNTER — Other Ambulatory Visit: Payer: 59 | Admitting: *Deleted

## 2016-09-10 DIAGNOSIS — R0602 Shortness of breath: Secondary | ICD-10-CM

## 2016-09-10 DIAGNOSIS — J810 Acute pulmonary edema: Secondary | ICD-10-CM

## 2016-09-10 DIAGNOSIS — R918 Other nonspecific abnormal finding of lung field: Secondary | ICD-10-CM

## 2016-09-10 MED ORDER — AZITHROMYCIN 250 MG PO TABS: ORAL_TABLET | ORAL | 0 refills | Status: DC

## 2016-09-10 MED FILL — AZITHROMYCIN 250 MG TABLET: 250 | 5 days supply | Qty: 6 | Fill #0

## 2016-09-10 NOTE — Progress Notes (Signed)
Please see phone note from 09/10/16. Will sign off.

## 2016-09-10 NOTE — Telephone Encounter (Signed)
Notes recorded by Chilton Greathouse, MD on 09/09/2016 at 5:27 PM EDT Please let the patient know that the CT scan does not show any interstitial lung disease. There is an opacity in right lung that is suggestive of infection.  Call in a Z pack and order a repeat CXR in 4 weeks. Thanks  Called and spoke to pt. Informed her of the results and recs per PM. Order placed and rx sent to preferred pharmacy. Pt verbalized understanding and denied any further questions or concerns at this time.

## 2016-09-11 ENCOUNTER — Other Ambulatory Visit: Payer: Self-pay

## 2016-09-11 LAB — BASIC METABOLIC PANEL
BUN/Creatinine Ratio: 21 (ref 9–23)
BUN: 13 mg/dL (ref 6–20)
CO2: 22 mmol/L (ref 18–29)
Calcium: 9.3 mg/dL (ref 8.7–10.2)
Chloride: 93 mmol/L — ABNORMAL LOW (ref 96–106)
Creatinine, Ser: 0.61 mg/dL (ref 0.57–1.00)
GFR calc Af Amer: 135 mL/min/{1.73_m2} (ref >59)
GFR calc non Af Amer: 117 mL/min/{1.73_m2} (ref >59)
Glucose: 81 mg/dL (ref 65–99)
Potassium: 4.1 mmol/L (ref 3.5–5.2)
Sodium: 134 mmol/L (ref 134–144)

## 2016-09-11 NOTE — Telephone Encounter (Signed)
Pharmacy sent over PA for Dymista. The alternatives are: Fluticasone Flunisolide  Did you want to proceed with PA or change medications.

## 2016-09-13 ENCOUNTER — Ambulatory Visit: Payer: 59

## 2016-09-16 MED ORDER — FLUTICASONE PROPIONATE 50 MCG/ACT NA SUSP: 2.0000 | Freq: Every day | NASAL | 3 refills | Status: DC

## 2016-09-16 MED FILL — FLUTICASONE PROP 50 MCG SPR: 50 | 30 days supply | Qty: 16 | Fill #0

## 2016-09-16 NOTE — Telephone Encounter (Signed)
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

## 2016-09-16 NOTE — Telephone Encounter (Signed)
Rx sent to preferred pharmacy. Pt is aware and voiced her understanding.  Pt is curious as to why she keeps developing PNA.  PM please advise. Thanks.

## 2016-09-16 NOTE — Telephone Encounter (Signed)
Can do fluticasone nasal spray

## 2016-09-16 NOTE — Telephone Encounter (Signed)
lmtcb x1 to make pt aware

## 2016-09-17 ENCOUNTER — Ambulatory Visit: Payer: 59

## 2016-10-04 ENCOUNTER — Telehealth: Payer: Self-pay | Admitting: Family

## 2016-10-04 DIAGNOSIS — F411 Generalized anxiety disorder: Secondary | ICD-10-CM

## 2016-10-04 MED ORDER — TRAZODONE HCL 100 MG PO TABS: 300.0000 mg | ORAL_TABLET | Freq: Every day | ORAL | 0 refills | Status: DC

## 2016-10-04 MED ORDER — ALPRAZOLAM 0.25 MG PO TABS: ORAL_TABLET | ORAL | 1 refills | Status: DC

## 2016-10-04 MED FILL — AMPHETAMINE SALTS 10 MG TAB: 10 | 30 days supply | Qty: 30 | Fill #0

## 2016-10-04 MED FILL — ALPRAZolam 0.25 MG TABS: 0.25 | 30 days supply | Qty: 30 | Fill #0

## 2016-10-04 MED FILL — traZODone HCL 100 MG TABS: 100 | 30 days supply | Qty: 90 | Fill #0

## 2016-10-04 MED FILL — AMPHETAMINE SALTS 30 MG TAB: 30 | 30 days supply | Qty: 30 | Fill #0

## 2016-10-04 NOTE — Telephone Encounter (Signed)
Pt would like refill of  ALPRAZolam (XANAX) 0.25 MG tablet  traZODone (DESYREL) 100 MG    [facility]

## 2016-10-04 NOTE — Telephone Encounter (Signed)
Medication faxed

## 2016-10-04 NOTE — Telephone Encounter (Signed)
Last refill was 09/04/16 per [place] [facility] 

## 2016-10-04 NOTE — Telephone Encounter (Signed)
Medications refilled

## 2016-10-25 ENCOUNTER — Ambulatory Visit (INDEPENDENT_AMBULATORY_CARE_PROVIDER_SITE_OTHER): Payer: 59 | Admitting: Family

## 2016-10-25 ENCOUNTER — Encounter: Payer: Self-pay | Admitting: Family

## 2016-10-25 VITALS — BP 108/72 | HR 75 | Temp 98.2°F | Resp 16 | Ht 63.0 in | Wt 184.8 lb

## 2016-10-25 DIAGNOSIS — F988 Other specified behavioral and emotional disorders with onset usually occurring in childhood and adolescence: Secondary | ICD-10-CM

## 2016-10-25 DIAGNOSIS — G479 Sleep disorder, unspecified: Secondary | ICD-10-CM | POA: Diagnosis not present

## 2016-10-25 DIAGNOSIS — F411 Generalized anxiety disorder: Secondary | ICD-10-CM

## 2016-10-25 MED ORDER — AMPHETAMINE-DEXTROAMPHETAMINE 30 MG PO TABS: 30.0000 mg | ORAL_TABLET | Freq: Every day | ORAL | 0 refills | Status: DC

## 2016-10-25 MED ORDER — TRAZODONE HCL 100 MG PO TABS: 300.0000 mg | ORAL_TABLET | Freq: Every day | ORAL | 1 refills | Status: DC

## 2016-10-25 MED ORDER — ALPRAZOLAM 0.5 MG PO TABS: ORAL_TABLET | ORAL | 0 refills | Status: DC

## 2016-10-25 MED ORDER — AMPHETAMINE-DEXTROAMPHETAMINE 10 MG PO TABS: 10.0000 mg | ORAL_TABLET | Freq: Every day | ORAL | 0 refills | Status: DC | PRN

## 2016-10-25 NOTE — Patient Instructions (Signed)
Thank you for choosing [facility].  SUMMARY AND INSTRUCTIONS:  Continue to take your medications as prescribed.   Follow up in 3 months or sooner.   Medication:  Your prescription(s) have been submitted to your pharmacy or been printed and provided for you. Please take as directed and contact our office if you believe you are having problem(s) with the medication(s) or have any questions.  Follow up:  If your symptoms worsen or fail to improve, please contact our office for further instruction, or in case of emergency go directly to the emergency room at the closest medical facility.

## 2016-10-25 NOTE — Progress Notes (Signed)
Subjective:    Patient ID: Cynthia Townsend, female    DOB: [birth year], 36 y.o.   MRN: [id number]  Chief Complaint  Patient presents with  . Follow-up    trazodone, adderall, and xanax     HPI:  Cynthia Townsend is a 36 y.o. female who  has a past medical history of ADD (attention deficit disorder); Anxiety; Chicken pox; Classic migraine with aura (09/20/2015); Depression; Ectopic pregnancy; Insomnia; Nasal polyps; Pregnancy induced hypertension; Sciatic pain; Subclinical hypothyroidism; and Vitamin D deficiency. and presents today for a follow up office visit.   1.) ADHD - Currently maintained on Adderall and reports taking the medication as prescribed and denies adverse side effects. Concentration and symptoms are generally well controlled with the current dosage of medication. Sleeping approximately 6-8 hours per night. No significant changes to appetite or weight. No headaches, chest pain, shortness of breath, or heart palpitations.   2.) Insomnia - currently maintained on trazodone. Reports taking the medication as prescribed and denies adverse side effects. Averaging approximately 6-8 hours sleep per night with medication and no grogginess in the morning. Does have sleep challenges with a toddler.    3.) Anxiety - currently maintained on alprazolam. Reports taking the medication as prescribed and denies adverse side effects. Notes while at work she is having to take additional dosages as her symptoms remain uncontrolled. Generally does not take the medication on days off.     Allergies  Allergen Reactions  . Penicillins Shortness Of Breath, Swelling and Other (See Comments)    Has patient had a PCN reaction causing immediate rash, facial/tongue/throat swelling, SOB or lightheadedness with hypotension:Unknown Has patient had a PCN reaction causing severe rash involving mucus membranes or skin necrosis: unknown Has patient had a PCN reaction that required hospitalization unknown Has  patient had a PCN reaction occurring within the last 10 years: uknown If all of the above answers are "NO", then may proceed with Cephalosporin use.     . Sulfa Antibiotics Hives and Rash      Outpatient Medications Prior to Visit  Medication Sig Dispense Refill  . albuterol (PROVENTIL HFA;VENTOLIN HFA) 108 (90 Base) MCG/ACT inhaler Inhale 2 puffs into the lungs every 6 (six) hours as needed for wheezing or shortness of breath. 1 Inhaler 2  . azithromycin (ZITHROMAX) 250 MG tablet Take as directed 6 tablet 0  . butalbital-acetaminophen-caffeine (FIORICET, ESGIC) 50-325-40 MG tablet Take 1 tablet by mouth every 4 (four) hours as needed for headache. 30 tablet 1  . docusate sodium (STOOL SOFTENER) 100 MG capsule Take 100 mg by mouth 3 (three) times daily.     . fluticasone (FLONASE) 50 MCG/ACT nasal spray Place 2 sprays into both nostrils daily. 16 g 3  . norethindrone (MICRONOR,CAMILA,ERRIN) 0.35 MG tablet Reported on 11/30/2015  11  . rizatriptan (MAXALT-MLT) 10 MG disintegrating tablet Take 1 tablet (10 mg total) by mouth as needed for migraine. May repeat in 2 hours if needed 10 tablet 6  . ALPRAZolam (XANAX) 0.25 MG tablet TAKE 1 TABLET BY MOUTH ONCE DAILY AS NEEDED FOR ANXIETY 30 tablet 1  . amphetamine-dextroamphetamine (ADDERALL) 10 MG tablet Take 1 tablet (10 mg total) by mouth daily as needed. (Patient taking differently: Take 10 mg by mouth daily. ) 30 tablet 0  . amphetamine-dextroamphetamine (ADDERALL) 30 MG tablet Take 1 tablet by mouth daily. 30 tablet 0  . traZODone (DESYREL) 100 MG tablet Take 3 tablets (300 mg total) by mouth at bedtime. 90 tablet 0  .  albuterol (PROVENTIL) (2.5 MG/3ML) 0.083% nebulizer solution Take 3 mLs (2.5 mg total) by nebulization every 4 (four) hours as needed for wheezing or shortness of breath. 75 mL 12  . Azelastine-Fluticasone (DYMISTA) 137-50 MCG/ACT SUSP Place 1 spray into the nose at bedtime. 1 Bottle 3  . doxycycline (VIBRAMYCIN) 100 MG capsule  Take 1 capsule (100 mg total) by mouth 2 (two) times daily. Take with food (Rx void after 09/07/16) 14 capsule 0  . furosemide (LASIX) 40 MG tablet Take 1 tablet (40 mg total) by mouth daily. 7 tablet 0  . guaiFENesin-codeine 100-10 MG/5ML syrup Take 10mL by mouth at bedtime as needed for cough 120 mL 0  . miconazole (MICATIN) 2 % cream Apply 1 application topically 2 (two) times daily. 28.35 g 0  . predniSONE (DELTASONE) 20 MG tablet Take one tab by mouth twice daily for 5 days, then one daily. Take with food. 14 tablet 0   No facility-administered medications prior to visit.       Past Surgical History:  Procedure Laterality Date  . BREAST BIOPSY     benign  punch bx  . DILATION AND EVACUATION N/A 09/04/2015   Procedure: DILATATION AND EVACUATION;  Surgeon: Mitchel Honour, DO;  Location: [facility];  Service: Gynecology;  Laterality: N/A;  . DNC        Past Medical History:  Diagnosis Date  . ADD (attention deficit disorder)   . Anxiety   . Chicken pox   . Classic migraine with aura 09/20/2015  . Depression   . Ectopic pregnancy   . Insomnia   . Nasal polyps   . Pregnancy induced hypertension   . Sciatic pain   . Subclinical hypothyroidism   . Vitamin D deficiency       Review of Systems  Constitutional: Negative for appetite change, diaphoresis, fatigue, fever and unexpected weight change.  Respiratory: Negative for chest tightness and shortness of breath.   Cardiovascular: Negative for chest pain, palpitations and leg swelling.  Neurological: Negative for dizziness and light-headedness.  Psychiatric/Behavioral: Negative for decreased concentration, self-injury, sleep disturbance and suicidal ideas. The patient is nervous/anxious. The patient is not hyperactive.       Objective:    BP 108/72 (BP Location: Left Arm, Patient Position: Sitting, Cuff Size: Large)   Pulse 75   Temp 98.2 F (36.8 C) (Oral)   Resp 16   Ht 5\' 3"  (1.6 m)   Wt 184 lb 12.8 oz (83.8 kg)   SpO2  98%   BMI 32.74 kg/m  Nursing note and vital signs reviewed.  Physical Exam  Constitutional: She is oriented to person, place, and time. She appears well-developed and well-nourished. No distress.  Cardiovascular: Normal rate, regular rhythm, normal heart sounds and intact distal pulses.   Pulmonary/Chest: Effort normal and breath sounds normal.  Neurological: She is alert and oriented to person, place, and time.  Skin: Skin is warm and dry.  Psychiatric: She has a normal mood and affect. Her behavior is normal. Judgment and thought content normal.       Assessment & Plan:   Problem List Items Addressed This Visit      Other   ADD (attention deficit disorder)    Attention deficit appears adequately controlled current medication regimen and no adverse side effects. Eating and sleeping well. No cardiac symptoms. Continue current dosage of Adderall. [place] controlled substance database reviewed with no irregularities. Follow-up in 3 months or sooner if needed.      Relevant  Medications   amphetamine-dextroamphetamine (ADDERALL) 10 MG tablet   amphetamine-dextroamphetamine (ADDERALL) 30 MG tablet   Generalized anxiety disorder    Anxiety appears labile with current medication regimen with increased levels of anxiety at work. Increase alprazolam to 0.5 mg daily as needed. Encourage stress and stress relief. Consider daily medication if symptoms continue to worsen.      Relevant Medications   ALPRAZolam (XANAX) 0.5 MG tablet   Sleep disturbance - Primary    Sleep is stable with current medication regimen and no adverse side effects. Continue current dosage of trazodone.          I have discontinued Ms. Maxton's miconazole, predniSONE, guaiFENesin-codeine, doxycycline, furosemide, and Azelastine-Fluticasone. I have also changed her ALPRAZolam. Additionally, I am having her maintain her docusate sodium, norethindrone, butalbital-acetaminophen-caffeine, rizatriptan,  albuterol, azithromycin, fluticasone, amphetamine-dextroamphetamine, amphetamine-dextroamphetamine, and traZODone.   Meds ordered this encounter  Medications  . DISCONTD: amphetamine-dextroamphetamine (ADDERALL) 10 MG tablet    Sig: Take 1 tablet (10 mg total) by mouth daily as needed.    Dispense:  30 tablet    Refill:  0    Fill on or after 11/03/16    Order Specific Question:   Supervising Provider    Answer:   Hillard Danker A [[id number]]  . DISCONTD: amphetamine-dextroamphetamine (ADDERALL) 30 MG tablet    Sig: Take 1 tablet by mouth daily.    Dispense:  30 tablet    Refill:  0    Fill on or after 11/03/2016    Order Specific Question:   Supervising Provider    Answer:   Hillard Danker A [[id number]]  . DISCONTD: amphetamine-dextroamphetamine (ADDERALL) 30 MG tablet    Sig: Take 1 tablet by mouth daily.    Dispense:  30 tablet    Refill:  0    Fill on or after 12/03/2016    Order Specific Question:   Supervising Provider    Answer:   Hillard Danker A [[id number]]  . DISCONTD: amphetamine-dextroamphetamine (ADDERALL) 10 MG tablet    Sig: Take 1 tablet (10 mg total) by mouth daily as needed.    Dispense:  30 tablet    Refill:  0    Fill on or after 12/03/16    Order Specific Question:   Supervising Provider    Answer:   Hillard Danker A [[id number]]  . amphetamine-dextroamphetamine (ADDERALL) 10 MG tablet    Sig: Take 1 tablet (10 mg total) by mouth daily as needed.    Dispense:  30 tablet    Refill:  0    Fill on or after 01/02/17    Order Specific Question:   Supervising Provider    Answer:   Hillard Danker A [[id number]]  . amphetamine-dextroamphetamine (ADDERALL) 30 MG tablet    Sig: Take 1 tablet by mouth daily.    Dispense:  30 tablet    Refill:  0    Fill on or after 01/02/2017    Order Specific Question:   Supervising Provider    Answer:   Hillard Danker A [[id number]]  . ALPRAZolam (XANAX) 0.5 MG tablet    Sig: TAKE 1 TABLET BY MOUTH ONCE DAILY AS NEEDED FOR  ANXIETY    Dispense:  30 tablet    Refill:  0    Order Specific Question:   Supervising Provider    Answer:   Hillard Danker A [[id number]]  . traZODone (DESYREL) 100 MG tablet    Sig: Take 3 tablets (300 mg total) by mouth at bedtime.  Dispense:  270 tablet    Refill:  1    Order Specific Question:   Supervising Provider    Answer:   Hillard DankerRAWFORD, ELIZABETH A [[id number]]     Follow-up: Return in about 3 months (around 01/25/2017), or if symptoms worsen or fail to improve.  Jeanine Luzalone, Auther Lyerly, FNP

## 2016-10-26 ENCOUNTER — Encounter: Payer: Self-pay | Admitting: Family

## 2016-10-26 NOTE — Assessment & Plan Note (Signed)
Attention deficit appears adequately controlled current medication regimen and no adverse side effects. Eating and sleeping well. No cardiac symptoms. Continue current dosage of Adderall. [place] controlled substance database reviewed with no irregularities. Follow-up in 3 months or sooner if needed.

## 2016-10-26 NOTE — Assessment & Plan Note (Signed)
Sleep is stable with current medication regimen and no adverse side effects. Continue current dosage of trazodone.

## 2016-10-26 NOTE — Assessment & Plan Note (Signed)
Anxiety appears labile with current medication regimen with increased levels of anxiety at work. Increase alprazolam to 0.5 mg daily as needed. Encourage stress and stress relief. Consider daily medication if symptoms continue to worsen.

## 2016-10-28 MED FILL — ALPRAZolam 0.5 MG TABS: 0.5 | 30 days supply | Qty: 30 | Fill #0

## 2016-10-28 MED FILL — BUTALBITAL/APAP/CAFFEINE TB: 50-325-40 | 5 days supply | Qty: 30 | Fill #1

## 2016-11-01 ENCOUNTER — Telehealth: Payer: Self-pay | Admitting: Pulmonary Disease

## 2016-11-01 NOTE — Telephone Encounter (Signed)
Rec'd FMLA forms via fax from [employer] - fwd to [employer] via interoffice mail-pr  °

## 2016-11-05 MED FILL — AMPHETAMINE SALTS 10 MG TAB: 10 | 30 days supply | Qty: 30 | Fill #0

## 2016-11-05 MED FILL — traZODone HCL 100 MG TABS: 100 | 90 days supply | Qty: 270 | Fill #0

## 2016-11-05 MED FILL — AMPHETAMINE SALTS 30 MG TAB: 30 | 30 days supply | Qty: 30 | Fill #0

## 2016-11-07 ENCOUNTER — Ambulatory Visit (INDEPENDENT_AMBULATORY_CARE_PROVIDER_SITE_OTHER)
Admission: RE | Admit: 2016-11-07 | Discharge: 2016-11-07 | Disposition: A | Discharge status: Home / Self Care | Payer: 59 | Source: Ambulatory Visit | Attending: Pulmonary Disease | Admitting: Pulmonary Disease

## 2016-11-07 ENCOUNTER — Ambulatory Visit (INDEPENDENT_AMBULATORY_CARE_PROVIDER_SITE_OTHER): Payer: 59 | Admitting: Pulmonary Disease

## 2016-11-07 ENCOUNTER — Encounter: Payer: Self-pay | Admitting: Pulmonary Disease

## 2016-11-07 ENCOUNTER — Other Ambulatory Visit: Payer: 59 | Admitting: *Deleted

## 2016-11-07 ENCOUNTER — Telehealth: Payer: Self-pay | Admitting: Pulmonary Disease

## 2016-11-07 VITALS — BP 132/76 | HR 100 | Ht 64.0 in | Wt 181.0 lb

## 2016-11-07 DIAGNOSIS — R0602 Shortness of breath: Secondary | ICD-10-CM

## 2016-11-07 DIAGNOSIS — J9811 Atelectasis: Secondary | ICD-10-CM | POA: Diagnosis not present

## 2016-11-07 DIAGNOSIS — J189 Pneumonia, unspecified organism: Secondary | ICD-10-CM

## 2016-11-07 DIAGNOSIS — R918 Other nonspecific abnormal finding of lung field: Secondary | ICD-10-CM | POA: Diagnosis not present

## 2016-11-07 LAB — PULMONARY FUNCTION TEST
DL/VA % PRED: 109 %
DL/VA: 5.24 ml/min/mmHg/L
DLCO COR % PRED: 96 %
DLCO COR: 23.31 ml/min/mmHg
DLCO UNC % PRED: 98 %
DLCO UNC: 23.86 ml/min/mmHg
FEF 25-75 PRE: 2.8 L/s
FEF 25-75 Post: 3.4 L/sec
FEF2575-%CHANGE-POST: 21 %
FEF2575-%PRED-POST: 104 %
FEF2575-%PRED-PRE: 86 %
FEV1-%Change-Post: 5 %
FEV1-%Pred-Post: 96 %
FEV1-%Pred-Pre: 91 %
FEV1-Post: 2.98 L
FEV1-Pre: 2.83 L
FEV1FVC-%CHANGE-POST: 3 %
FEV1FVC-%Pred-Pre: 98 %
FEV6-%CHANGE-POST: 2 %
FEV6-%Pred-Post: 95 %
FEV6-%Pred-Pre: 93 %
FEV6-PRE: 3.43 L
FEV6-Post: 3.5 L
FEV6FVC-%Change-Post: 0 %
FEV6FVC-%PRED-PRE: 100 %
FEV6FVC-%Pred-Post: 101 %
FVC-%Change-Post: 1 %
FVC-%Pred-Post: 93 %
FVC-%Pred-Pre: 92 %
FVC-Post: 3.5 L
FVC-Pre: 3.45 L
POST FEV1/FVC RATIO: 85 %
Post FEV6/FVC ratio: 100 %
Pre FEV1/FVC ratio: 82 %
Pre FEV6/FVC Ratio: 99 %
RV % pred: 62 %
RV: 0.94 L
TLC % pred: 85 %
TLC: 4.34 L

## 2016-11-07 NOTE — Progress Notes (Signed)
Cynthia Townsend    [id number]    12/18/1980  Primary Care Physician:Calone, Tama Headings, FNP  Referring Physician: Veryl Speak, FNP [address] [place], [place] [address]  Chief complaint:  Follow up for dyspnea  HPI: Cynthia Townsend is a 36 year old with no significant past medical history. She's had episodes of recurrent bronchitis from October 2018. She was treated with antibiotics about 4 times and developed C. difficile after the last round. She had an episode of strep throat 2 weeks ago and got a course of Keflex. She was seen on 3/24 at urgent care for cough, sinus congestion, sore throat and marked dyspnea, chest tightness. She was started on prednisone with some improvement in symptoms. Chest x-ray at that time was suggestive of pulmonary edema but BNP was normal She had an evaluation at the cardiology office yesterday with an echocardiogram which was normal. She was started on Lasix and given albuterol inhaler.   She had an episode of pulmonary edema last year after the birth of her daughter in the setting of preeclampsia. She improved with diuresis. She has minimal smoking history but quit several years ago. 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 She lives at an old home with leaking bathrooms and ceiling. She thinks that there may be mold issues. She has dogs at home but is not sensitive. No birds,hot tubs at home. No other known exposures. She had some issues with acid reflux during her pregnancy but no symptoms of the past year after delivery.  Interim History: Breathing is improved since his last visit. She reports some mild dyspnea with exertion, chronic cough with occasional white mucus mainly in the morning. She feels her cough is more related to her allergies.  Outpatient Encounter Prescriptions as of 11/07/2016  Medication Sig  . albuterol (PROVENTIL HFA;VENTOLIN HFA) 108 (90 Base) MCG/ACT inhaler Inhale 2 puffs into the lungs every 6 (six)  hours as needed for wheezing or shortness of breath.  . ALPRAZolam (XANAX) 0.5 MG tablet TAKE 1 TABLET BY MOUTH ONCE DAILY AS NEEDED FOR ANXIETY  . amphetamine-dextroamphetamine (ADDERALL) 10 MG tablet Take 1 tablet (10 mg total) by mouth daily as needed.  Marland Kitchen amphetamine-dextroamphetamine (ADDERALL) 30 MG tablet Take 1 tablet by mouth daily.  . butalbital-acetaminophen-caffeine (FIORICET, ESGIC) 50-325-40 MG tablet Take 1 tablet by mouth every 4 (four) hours as needed for headache.  . docusate sodium (STOOL SOFTENER) 100 MG capsule Take 100 mg by mouth 3 (three) times daily.   . fluticasone (FLONASE) 50 MCG/ACT nasal spray Place 2 sprays into both nostrils daily.  . naproxen sodium (ALEVE) 220 MG tablet Take 220 mg by mouth as needed.  . rizatriptan (MAXALT-MLT) 10 MG disintegrating tablet Take 1 tablet (10 mg total) by mouth as needed for migraine. May repeat in 2 hours if needed  . traZODone (DESYREL) 100 MG tablet Take 3 tablets (300 mg total) by mouth at bedtime.  . [DISCONTINUED] azithromycin (ZITHROMAX) 250 MG tablet Take as directed  . [DISCONTINUED] norethindrone (MICRONOR,CAMILA,ERRIN) 0.35 MG tablet Reported on 11/30/2015   No facility-administered encounter medications on file as of 11/07/2016.     Allergies as of 11/07/2016 - Review Complete 11/07/2016  Allergen Reaction Noted  . Penicillins Shortness Of Breath, Swelling, and Other (See Comments) 08/22/2012  . Sulfa antibiotics Hives and Rash 08/22/2012    Past Medical History:  Diagnosis Date  . ADD (attention deficit disorder)   . Anxiety   .  Chicken pox   . Classic migraine with aura 09/20/2015  . Depression   . Ectopic pregnancy   . Insomnia   . Nasal polyps   . Pregnancy induced hypertension   . Sciatic pain   . Subclinical hypothyroidism   . Vitamin D deficiency     Past Surgical History:  Procedure Laterality Date  . BREAST BIOPSY     benign  punch bx  . DILATION AND EVACUATION N/A 09/04/2015   Procedure:  DILATATION AND EVACUATION;  Surgeon: Mitchel Honour, DO;  Location: [facility];  Service: Gynecology;  Laterality: N/A;  . DNC      Family History  Problem Relation Age of Onset  . Alcohol abuse Mother   . Lung cancer Mother   . Hyperlipidemia Mother   . Heart disease Mother   . Mental illness Mother   . Hyperthyroidism Mother   . COPD Mother   . Hyperlipidemia Father   . Heart disease Father   . Transient ischemic attack Father   . Pulmonary embolism Father   . Heart disease Sister   . Mental illness Sister   . Heart attack Sister   . Diabetes Sister   . Rheum arthritis Maternal Grandmother   . Heart disease Maternal Uncle     Social History   Social History  . Marital status: Single    Spouse name: N/A  . Number of children: 0  . 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 Smoking status: Former Smoker    Types: Cigarettes  . Smokeless tobacco: Never Used  . Alcohol use No  . Drug use: No  . Sexual activity: Yes    Birth control/ protection: None   Other Topics Concern  . Not on file   Social History Narrative   Lives at home w/ her fiance and new daughter.   Right-handed.   Drinks about 1-2 cups of caffeine per day.   Fun: Hiking, Zumba, sleep   Denies religious beliefs effecting health care.   Denies abuse and feels safe at home.    Review of systems: Review of Systems  Constitutional: Negative for fever and chills.  HENT: Negative.   Eyes: Negative for blurred vision.  Respiratory: as per HPI  Cardiovascular: Negative for chest pain and palpitations.  Gastrointestinal: Negative for vomiting, diarrhea, blood per rectum. Genitourinary: Negative for dysuria, urgency, frequency and hematuria.  Musculoskeletal: Negative for myalgias, back pain and joint pain.  Skin: Negative for itching and rash.  Neurological: Negative for dizziness, tremors, focal weakness, seizures and loss of consciousness.    Endo/Heme/Allergies: Negative for environmental allergies.  Psychiatric/Behavioral: Negative for depression, suicidal ideas and hallucinations.  All other systems reviewed and are negative.  Physical Exam: Blood pressure 132/76, pulse 100, height 5\' 4"  (1.626 m), weight 181 lb (82.1 kg), last menstrual period 10/30/2016, SpO2 98 %, currently breastfeeding. Gen:      No acute distress HEENT:  EOMI, sclera anicteric Neck:     No masses; no thyromegaly Lungs:    Clear to auscultation bilaterally; normal respiratory effort CV:         Regular rate and rhythm; no murmurs Abd:      + bowel sounds; soft, non-tender; no palpable masses, no distension Ext:    No edema; adequate peripheral perfusion Skin:      Warm and dry; no rash Neuro: alert and oriented x 3 Psych: normal  mood and affect  Data Reviewed: Chest x-ray 09/04/15-mild congestive heart failure Chest x-ray 09/07/15-resolution of heart failure Chest x-ray 08/31/16-mild vascular congestion with minimal edema High-resolution CT 09/07/16-no evidence of interstitial lung disease, left lower lobe consolidation, I have reviewed all the images personally.  Echo 09/07/15-EF 55-60 percent, diastolic dysfunction with elevated LV filling pressure, mild MR, moderate left atrial enlargement, trivial TR, normal RVSP. Echo 09/03/16-EF 60-65% and no diastolic dysfunction. Normal RV systolic function. Normal study  FENO 09/04/16- 12  PFTs 11/07/16 FVC 2.50 [93%) FEV1 2.98 (96%) F/F 85 TLC 85% DLCO 98% Normal study   Assessment:  Cough She likely has upper airway cough syndrome from postnasal drip, allergic rhinitis. PFTs reviewed with her. They are normal with no evidence of obstruction She will continue on chlorpheniramine and dymista nasal spray as needed  Abnormal CXR Reviewed the high res CT, which she got to evaluate interstitial opacities.. There is no evidence of ILD. She received z pack for lt lower lobe consolidation. Get CXR  today Noted to have mild thyroid enlargement. Check TSH and follow up with PCP  Plan/Recommendations: - Continue chlorpheniramine, dymista nasal spray - Continue albuterol inhaler. - CXR - TSH  Chilton GreathousePraveen Jarquez Mestre MD [facility] Pager [phone] 11/07/2016, 12:28 PM  CC: Veryl Speakalone, Gregory D, FNP

## 2016-11-07 NOTE — Telephone Encounter (Signed)
Pt is aware that this was a schedule error, as pt was not to have a EBUS.  Pt voiced her understanding & had no further questions.  Nothing further needed.

## 2016-11-07 NOTE — Patient Instructions (Addendum)
We will get a chest x-ray today to ensure that there lung infiltrate has improved Continue using the albuterol inhaler as needed I have reviewed your pulmonary function test which did not show any abnormality There is some slight enlargement of your thyroid. Will give an order for checking the thyroid function. Please follow up with her primary care regarding this.  Return to clinic in 6 months

## 2016-11-07 NOTE — Progress Notes (Signed)
PFT done today. 

## 2016-11-08 LAB — TSH: TSH: 2.87 u[IU]/mL (ref 0.450–4.500)

## 2016-11-18 ENCOUNTER — Encounter (HOSPITAL_COMMUNITY): Payer: 59

## 2016-11-18 ENCOUNTER — Encounter (HOSPITAL_COMMUNITY): Admission: RE | Payer: Self-pay | Source: Ambulatory Visit

## 2016-11-18 ENCOUNTER — Ambulatory Visit (HOSPITAL_COMMUNITY): Admission: RE | Admit: 2016-11-18 | Payer: 59 | Source: Ambulatory Visit | Admitting: Pulmonary Disease

## 2016-11-18 SURGERY — ENDOBRONCHIAL ULTRASOUND (EBUS)
Anesthesia: General | Laterality: Bilateral

## 2016-11-19 ENCOUNTER — Telehealth: Payer: Self-pay | Admitting: Pulmonary Disease

## 2016-11-19 NOTE — Telephone Encounter (Addendum)
LMTCB for the pt  I asked MB about forms and her and PM have not seen anything

## 2016-11-20 NOTE — Telephone Encounter (Signed)
Called and spoke with pt and she stated that she has a paper that she received from [facility] and she was confused bc it is a release of information and she stated that all of her records are within [facility].  She stated that she will get the paper dropped off today to [employer] so they can get it over to PM this week to be signed since this has to be completed by 6/18.  Will forward to MB to follow up on papers this week.

## 2016-11-21 NOTE — Telephone Encounter (Signed)
rec'd FMLA forms via interoffice mail from [employer] - fwd to ChristmasMargie for PM to complete -pr

## 2016-11-26 NOTE — Telephone Encounter (Signed)
Looked in PM's cubby. Did not see any FMLA papers on this patient.   [place] AltMargie, has PM already signed these papers? Please advise. Thanks.

## 2016-11-27 ENCOUNTER — Telehealth: Payer: 59 | Admitting: Family

## 2016-11-27 DIAGNOSIS — J019 Acute sinusitis, unspecified: Secondary | ICD-10-CM

## 2016-11-27 MED ORDER — AZITHROMYCIN 250 MG PO TABS: ORAL_TABLET | ORAL | 0 refills | Status: DC

## 2016-11-27 MED FILL — AZITHROMYCIN 250 MG TABLET: 250 | 5 days supply | Qty: 6 | Fill #0

## 2016-11-27 NOTE — Telephone Encounter (Signed)
Forms have been completed by PM and given to [place] LPatrice. Nothing further needed.

## 2016-11-27 NOTE — Progress Notes (Signed)
We are sorry that you are not feeling well.  Here is how we plan to help!  Based on what you have shared with me it looks like you have sinusitis.  Sinusitis is inflammation and infection in the sinus cavities of the head.  Based on your presentation I believe you most likely have Acute Bacterial sinusitis.  This is an infection caused by bacteria and is treated with antibiotics.  I have prescribed Azithromyin 250 mg: two tables now and then one tablet daily for 4 additonal days  You may use an oral decongestant such as Mucinex D or if you have glaucoma or high blood pressure use plain Mucinex.  Saline nasal sprays help an can sefely be used as often as needed for congestion.  If you develop worsening sinus pain, fever or notice severe headache and vision changes, or if symptoms are not better after completion of antibiotic, please schedule an appointment with a health care provider.  Sinus infections are not as easily transmitted as other respiratory infection, however we still recommend that you avoid close contact with loved ones, especially the very young and elderly.  Remember to wash your hands thoroughly throughout the day as this is the number one way to prevent the spread of infection!  Home Care:  Only take medications as instructed by your medical team.  Complete the entire course of an antibiotic.  Do not take these medications with alcohol.  A steam or ultrasonic humidifier can help congestion.  You can place a towel over your head and breathe in the steam from hot water coming from a faucet.  Avoid close contacts especially the very young and the elderly.  Cover your mouth when you cough or sneeze.  Always remember to wash your hands.  Get Help Right Away If:  You develop worsening fever or sinus pain.  You develop a severe head ache or visual changes.  Your symptoms persist after you have completed your treatment plan.  Make sure you  Understand these  instructions.  Will watch your condition.  Will get help right away if you are not doing well or get worse.  Your e-visit answers were reviewed by a board certified advanced clinical practitioner to complete your personal care plan.  Depending on the condition, your plan could have included both over the counter or prescription medications.  If there is a problem please reply  once you have received a response from your provider.  Your safety is important to us.  If you have drug allergies check your prescription carefully.    You can use MyChart to ask questions about today's visit, request a non-urgent call back, or ask for a work or school excuse.  You will get an e-mail in the next two days asking about your experience.  I hope that your e-visit has been valuable and will speed your recovery. Thank you for using e-visits.        

## 2016-11-28 NOTE — Telephone Encounter (Signed)
Rec'd completed forms - fwd to [employer] via interoffice mail -pr  °

## 2016-12-03 DIAGNOSIS — N911 Secondary amenorrhea: Secondary | ICD-10-CM | POA: Diagnosis not present

## 2016-12-04 NOTE — Telephone Encounter (Signed)
Per Delray AltMargie -PM authorizing FMLA through her next office visit - updated paperwork accordingly and sent back to [employer] via interoffice mail -pr

## 2016-12-04 NOTE — Telephone Encounter (Signed)
[employer] sent back FMLA paperwork for the patient to addend the intermittent leave to cover already missed days and future dates- Fwd to MadisonMargie to have PM review to ensure this is okay - pr

## 2016-12-17 MED FILL — DEXTROAMP-AMP 10 MG TAB: 10 | 30 days supply | Qty: 30 | Fill #0

## 2016-12-17 MED FILL — AMPHETAMINE SALTS 30 MG TAB: 30 | 30 days supply | Qty: 30 | Fill #0

## 2016-12-25 DIAGNOSIS — N911 Secondary amenorrhea: Secondary | ICD-10-CM | POA: Diagnosis not present

## 2016-12-31 DIAGNOSIS — Z3685 Encounter for antenatal screening for Streptococcus B: Secondary | ICD-10-CM | POA: Diagnosis not present

## 2016-12-31 DIAGNOSIS — Z3481 Encounter for supervision of other normal pregnancy, first trimester: Secondary | ICD-10-CM | POA: Diagnosis not present

## 2016-12-31 DIAGNOSIS — Z13228 Encounter for screening for other metabolic disorders: Secondary | ICD-10-CM | POA: Diagnosis not present

## 2016-12-31 LAB — OB RESULTS CONSOLE HEPATITIS B SURFACE ANTIGEN: Hepatitis B Surface Ag: NEGATIVE

## 2016-12-31 LAB — OB RESULTS CONSOLE ABO/RH: "RH Type ": NEGATIVE

## 2016-12-31 LAB — OB RESULTS CONSOLE GC/CHLAMYDIA
Chlamydia: NEGATIVE
Gonorrhea: NEGATIVE

## 2016-12-31 LAB — OB RESULTS CONSOLE ANTIBODY SCREEN: Antibody Screen: NEGATIVE

## 2016-12-31 LAB — OB RESULTS CONSOLE RUBELLA ANTIBODY, IGM: Rubella: IMMUNE

## 2016-12-31 LAB — OB RESULTS CONSOLE HIV ANTIBODY (ROUTINE TESTING): HIV: NONREACTIVE

## 2016-12-31 LAB — OB RESULTS CONSOLE RPR: RPR: NONREACTIVE

## 2017-01-03 DIAGNOSIS — Z3480 Encounter for supervision of other normal pregnancy, unspecified trimester: Secondary | ICD-10-CM | POA: Diagnosis not present

## 2017-01-03 DIAGNOSIS — Z3401 Encounter for supervision of normal first pregnancy, first trimester: Secondary | ICD-10-CM | POA: Diagnosis not present

## 2017-01-03 DIAGNOSIS — Z113 Encounter for screening for infections with a predominantly sexual mode of transmission: Secondary | ICD-10-CM | POA: Diagnosis not present

## 2017-01-03 MED FILL — AZITHROMYCIN 250 MG TABLET: 250 | 5 days supply | Qty: 6 | Fill #0

## 2017-01-22 ENCOUNTER — Encounter: Payer: Self-pay | Admitting: Internal Medicine

## 2017-01-22 ENCOUNTER — Ambulatory Visit (INDEPENDENT_AMBULATORY_CARE_PROVIDER_SITE_OTHER): Payer: 59 | Admitting: Internal Medicine

## 2017-01-22 VITALS — BP 118/74 | HR 97 | Temp 98.3°F | Ht 64.0 in | Wt 199.0 lb

## 2017-01-22 DIAGNOSIS — Z3682 Encounter for antenatal screening for nuchal translucency: Secondary | ICD-10-CM | POA: Diagnosis not present

## 2017-01-22 DIAGNOSIS — Z3A12 12 weeks gestation of pregnancy: Secondary | ICD-10-CM | POA: Diagnosis not present

## 2017-01-22 DIAGNOSIS — O09521 Supervision of elderly multigravida, first trimester: Secondary | ICD-10-CM | POA: Diagnosis not present

## 2017-01-22 DIAGNOSIS — J810 Acute pulmonary edema: Secondary | ICD-10-CM | POA: Diagnosis not present

## 2017-01-22 DIAGNOSIS — B999 Unspecified infectious disease: Secondary | ICD-10-CM | POA: Diagnosis not present

## 2017-01-22 DIAGNOSIS — J029 Acute pharyngitis, unspecified: Secondary | ICD-10-CM

## 2017-01-22 MED ORDER — PREDNISONE 10 MG PO TABS: ORAL_TABLET | ORAL | 0 refills | Status: DC

## 2017-01-22 MED ORDER — AZITHROMYCIN 250 MG PO TABS: ORAL_TABLET | ORAL | 1 refills | Status: DC

## 2017-01-22 NOTE — Progress Notes (Signed)
Subjective:    Patient ID: Cynthia Townsend, female    DOB: 09/15/1980, 36 y.o.   MRN: [id number]  HPI  Here to f/u, has recent hx in the last yr of multiple different infections per pt, bronchitis, PNA, sinusitis.  Just recently s/p zpack for sinusitis in early august.  No hx of HIV or other immune d/o, but now Here with acute onset mild to mod 2-3 days severe ST, HA, general weakness and malaise, with since yesterday has prod cough greenish sputum, but Pt denies chest pain, increased sob or doe, wheezing, orthopnea, PND, increased LE swelling, palpitations, dizziness or syncope. Pt is currently pregnant.   Pt denies polydipsia, polyuria Past Medical History:  Diagnosis Date  . ADD (attention deficit disorder)   . Anxiety   . Chicken pox   . Classic migraine with aura 09/20/2015  . Depression   . Ectopic pregnancy   . Insomnia   . Nasal polyps   . Pregnancy induced hypertension   . Sciatic pain   . Subclinical hypothyroidism   . Vitamin D deficiency    Past Surgical History:  Procedure Laterality Date  . BREAST BIOPSY     benign  punch bx  . DILATION AND EVACUATION N/A 09/04/2015   Procedure: DILATATION AND EVACUATION;  Surgeon: Mitchel HonourMegan Morris, DO;  Location: [facility];  Service: Gynecology;  Laterality: N/A;  . DNC      reports that she has quit smoking. Her smoking use included Cigarettes. She has never used smokeless tobacco. She reports that she does not drink alcohol or use drugs. family history includes Alcohol abuse in her mother; COPD in her mother; Diabetes in her sister; Heart attack in her sister; Heart disease in her father, maternal uncle, mother, and sister; Hyperlipidemia in her father and mother; Hyperthyroidism in her mother; Lung cancer in her mother; Mental illness in her mother and sister; Pulmonary embolism in her father; Rheum arthritis in her maternal grandmother; Transient ischemic attack in her father. Allergies  Allergen Reactions  . Penicillins Shortness Of  Breath, Swelling and Other (See Comments)    Has patient had a PCN reaction causing immediate rash, facial/tongue/throat swelling, SOB or lightheadedness with hypotension:Unknown Has patient had a PCN reaction causing severe rash involving mucus membranes or skin necrosis: unknown Has patient had a PCN reaction that required hospitalization unknown Has patient had a PCN reaction occurring within the last 10 years: uknown If all of the above answers are "NO", then may proceed with Cephalosporin use.     . Sulfa Antibiotics Hives and Rash   Current Outpatient Prescriptions on File Prior to Visit  Medication Sig Dispense Refill  . albuterol (PROVENTIL HFA;VENTOLIN HFA) 108 (90 Base) MCG/ACT inhaler Inhale 2 puffs into the lungs every 6 (six) hours as needed for wheezing or shortness of breath. 1 Inhaler 2  . ALPRAZolam (XANAX) 0.5 MG tablet TAKE 1 TABLET BY MOUTH ONCE DAILY AS NEEDED FOR ANXIETY 30 tablet 0  . amphetamine-dextroamphetamine (ADDERALL) 10 MG tablet Take 1 tablet (10 mg total) by mouth daily as needed. 30 tablet 0  . amphetamine-dextroamphetamine (ADDERALL) 30 MG tablet Take 1 tablet by mouth daily. 30 tablet 0  . butalbital-acetaminophen-caffeine (FIORICET, ESGIC) 50-325-40 MG tablet Take 1 tablet by mouth every 4 (four) hours as needed for headache. 30 tablet 1  . docusate sodium (STOOL SOFTENER) 100 MG capsule Take 100 mg by mouth 3 (three) times daily.     . fluticasone (FLONASE) 50 MCG/ACT nasal spray Place 2 sprays  into both nostrils daily. 16 g 3  . naproxen sodium (ALEVE) 220 MG tablet Take 220 mg by mouth as needed.    . rizatriptan (MAXALT-MLT) 10 MG disintegrating tablet Take 1 tablet (10 mg total) by mouth as needed for migraine. May repeat in 2 hours if needed 10 tablet 6  . traZODone (DESYREL) 100 MG tablet Take 3 tablets (300 mg total) by mouth at bedtime. 270 tablet 1   No current facility-administered medications on file prior to visit.    Review of Systems   Constitutional: Negative for other unusual diaphoresis or sweats HENT: Negative for ear discharge or swelling Eyes: Negative for other worsening visual disturbances Respiratory: Negative for stridor or other swelling  Gastrointestinal: Negative for worsening distension or other blood Genitourinary: Negative for retention or other urinary change Musculoskeletal: Negative for other MSK pain or swelling Skin: Negative for color change or other new lesions Neurological: Negative for worsening tremors and other numbness  Psychiatric/Behavioral: Negative for worsening agitation or other fatigue All other exam findings    Objective:   Physical Exam BP 118/74   Pulse 97   Temp 98.3 F (36.8 C)   Ht 5\' 4"  (1.626 m)   Wt 199 lb (90.3 kg)   SpO2 98%   BMI 34.16 kg/m  VS noted, mild ill Constitutional: Pt appears in NAD HENT: Head: NCAT.  Right Ear: External ear normal.  Left Ear: External ear normal.  Eyes: . Pupils are equal, round, and reactive to light. Conjunctivae and EOM are normal Nose: without d/c or deformity Bilat tm's with mild erythema.  Max sinus areas non tender.  Pharynx with severe erythema and mild swelling, no exudate but overall diffusely swelling post pharynx Neck: Neck supple. Gross normal ROM Cardiovascular: Normal rate and regular rhythm.   Pulmonary/Chest: Effort normal and breath sounds mild decreased without rales or wheezing.  Abd:  + pregnant Neurological: Pt is alert. At baseline orientation, motor grossly intact Skin: Skin is warm. No rashes, other new lesions, no LE edema Psychiatric: Pt behavior is normal without agitation  No other exam findings    Assessment & Plan:

## 2017-01-22 NOTE — Patient Instructions (Addendum)
Please take all new medication as prescribed - the zpack, and the low dose prednisone for a few days only  You had the steroid shot today  Please continue all other medications as before, and refills have been done if requested.  Please have the pharmacy call with any other refills you may need.  Please keep your appointments with your specialists as you may have planned  Please go to the LAB in the Basement (turn left off the elevator) for the tests to be done at your convenience  You will be contacted by phone if any changes need to be made immediately.  Otherwise, you will receive a letter about your results with an explanation, but please check with MyChart first.  Please remember to sign up for MyChart if you have not done so, as this will be important to you in the future with finding out test results, communicating by private email, and scheduling acute appointments online when needed.

## 2017-01-23 ENCOUNTER — Other Ambulatory Visit: Payer: 59 | Admitting: *Deleted

## 2017-01-23 ENCOUNTER — Ambulatory Visit: Payer: 59 | Admitting: Family

## 2017-01-23 ENCOUNTER — Telehealth: Payer: Self-pay

## 2017-01-23 DIAGNOSIS — J029 Acute pharyngitis, unspecified: Secondary | ICD-10-CM | POA: Diagnosis not present

## 2017-01-23 DIAGNOSIS — J810 Acute pulmonary edema: Secondary | ICD-10-CM

## 2017-01-23 DIAGNOSIS — B999 Unspecified infectious disease: Secondary | ICD-10-CM

## 2017-01-23 DIAGNOSIS — I43 Cardiomyopathy in diseases classified elsewhere: Secondary | ICD-10-CM

## 2017-01-23 MED ORDER — CEFDINIR 300 MG PO CAPS: 300.0000 mg | ORAL_CAPSULE | Freq: Two times a day (BID) | ORAL | 0 refills | Status: DC

## 2017-01-23 MED FILL — CEFDINIR 300 MG CAPSULE: 300 | 10 days supply | Qty: 20 | Fill #0

## 2017-01-23 NOTE — Addendum Note (Signed)
Addended by: Tonita PhoenixBOWDEN, ROBIN K on: 01/23/2017 09:51 AM   Modules accepted: Orders

## 2017-01-23 NOTE — Telephone Encounter (Signed)
[facility] outpatient pharmacy called and wanted to see if the antibiotic prescribed yesterday could be changed to something different. Pt informed pharmacist that she been on azithromycin for a few months now and has had an encounter with c-diff. However she has a penicillin allergy. Please advise.

## 2017-01-23 NOTE — Telephone Encounter (Signed)
Ok for cefdinir 300 bid  - done erx

## 2017-01-23 NOTE — Addendum Note (Signed)
Addended by: Corwin LevinsJOHN, Amayia Ciano W on: 01/23/2017 11:30 AM   Modules accepted: Orders

## 2017-01-23 NOTE — Addendum Note (Signed)
Addended by: Tonita PhoenixBOWDEN, Marek Nghiem K on: 01/23/2017 09:50 AM   Modules accepted: Orders

## 2017-01-23 NOTE — Progress Notes (Signed)
ED.

## 2017-01-23 NOTE — Telephone Encounter (Signed)
Cefbinir 300mg  every 132 hours is what the pt and the pharmacist has decided on after speaking to her OBGYN.

## 2017-01-24 ENCOUNTER — Telehealth: Payer: Self-pay | Admitting: Pulmonary Disease

## 2017-01-24 LAB — CBC WITH DIFFERENTIAL/PLATELET
Basophils Absolute: 0 10*3/uL (ref 0.0–0.2)
Basos: 0 %
EOS (ABSOLUTE): 0.1 10*3/uL (ref 0.0–0.4)
Eos: 2 %
Hematocrit: 35.3 % (ref 34.0–46.6)
Hemoglobin: 12.2 g/dL (ref 11.1–15.9)
Immature Grans (Abs): 0 10*3/uL (ref 0.0–0.1)
Immature Granulocytes: 0 %
Lymphocytes Absolute: 0.7 10*3/uL (ref 0.7–3.1)
Lymphs: 9 %
MCH: 28.9 pg (ref 26.6–33.0)
MCHC: 34.6 g/dL (ref 31.5–35.7)
MCV: 84 fL (ref 79–97)
Monocytes Absolute: 0.5 10*3/uL (ref 0.1–0.9)
Monocytes: 7 %
Neutrophils Absolute: 6 10*3/uL (ref 1.4–7.0)
Neutrophils: 82 %
Platelets: 219 10*3/uL (ref 150–379)
RBC: 4.22 x10E6/uL (ref 3.77–5.28)
RDW: 14.2 % (ref 12.3–15.4)
WBC: 7.4 10*3/uL (ref 3.4–10.8)

## 2017-01-24 LAB — IGG, IGA, IGM
IgA/Immunoglobulin A, Serum: 215 mg/dL (ref 87–352)
IgG (Immunoglobin G), Serum: 1073 mg/dL (ref 700–1600)
IgM (Immunoglobulin M), Srm: 106 mg/dL (ref 26–217)

## 2017-01-24 LAB — SEDIMENTATION RATE: Sed Rate: 31 mm/hr (ref 0–32)

## 2017-01-24 LAB — C-REACTIVE PROTEIN: CRP: 39.5 mg/L — ABNORMAL HIGH (ref 0.0–4.9)

## 2017-01-24 NOTE — Telephone Encounter (Signed)
Nothing to offer over the phone and we don't have availability to see in office  It's a bit early to say whether the omnicef will work so rec go to ER over the w/e if feeling worse - otherwise let the med kick in and check back with [place] first of week, def will need f/u with Mannam or NP

## 2017-01-24 NOTE — Telephone Encounter (Signed)
Called and spoke with pt. Pt reports of non prod cough, increased sob, chest tightness, voice hoarseness, chills, sweats & temp 101 x2d PCP prescribed Omnicef yesterday, and suggested pt contact our office for an apt due to pt's hx of PNA. Pt states PCP originally ordered zpak and prednisone. Pt states once she arrived at the pharmacy to pick up Rx pharmacist noticed she had been on zpak every month since October, and called PCP for stronger abx.   Pt states she is coughing to the point she vomits and urinates. Pt is currently [redacted] weeks pregnant. Pt states she also had labs drawn yesterday.   MW please advise, as PM is unviable.    11/07/16 AVS Instructions   We will get a chest x-ray today to ensure that there lung infiltrate has improved Continue using the albuterol inhaler as needed I have reviewed your pulmonary function test which did not show any abnormality There is some slight enlargement of your thyroid. Will give an order for checking the thyroid function. Please follow up with her primary care regarding this.  Return to clinic in 6 months

## 2017-01-24 NOTE — Telephone Encounter (Signed)
Pt is aware of MW's recommendations and voiced her understanding. Nothing further needed.  

## 2017-01-25 NOTE — Assessment & Plan Note (Signed)
euvolemic by exam today, cont current tx

## 2017-01-25 NOTE — Assessment & Plan Note (Addendum)
Mild to mod, for antibx course, depomedrol IM 80, predpac asd,  to f/u any worsening symptoms or concerns 

## 2017-01-25 NOTE — Assessment & Plan Note (Signed)
Also for quant immunoglobulins r/o immune d/o

## 2017-01-27 ENCOUNTER — Ambulatory Visit (INDEPENDENT_AMBULATORY_CARE_PROVIDER_SITE_OTHER)
Admission: RE | Admit: 2017-01-27 | Discharge: 2017-01-27 | Disposition: A | Discharge status: Home / Self Care | Payer: 59 | Source: Ambulatory Visit | Attending: Internal Medicine | Admitting: Internal Medicine

## 2017-01-27 ENCOUNTER — Ambulatory Visit (INDEPENDENT_AMBULATORY_CARE_PROVIDER_SITE_OTHER): Payer: 59 | Admitting: Internal Medicine

## 2017-01-27 VITALS — BP 112/62 | HR 104 | Temp 98.3°F | Ht 63.0 in | Wt 196.6 lb

## 2017-01-27 DIAGNOSIS — R05 Cough: Secondary | ICD-10-CM

## 2017-01-27 DIAGNOSIS — J4521 Mild intermittent asthma with (acute) exacerbation: Secondary | ICD-10-CM | POA: Diagnosis not present

## 2017-01-27 DIAGNOSIS — R059 Cough, unspecified: Secondary | ICD-10-CM

## 2017-01-27 MED ORDER — BUDESONIDE-FORMOTEROL FUMARATE 80-4.5 MCG/ACT IN AERO: 2.0000 | INHALATION_SPRAY | Freq: Two times a day (BID) | RESPIRATORY_TRACT | 0 refills | Status: DC

## 2017-01-27 MED ORDER — ACETAMINOPHEN-CODEINE #3 300-30 MG PO TABS: ORAL_TABLET | ORAL | 0 refills | Status: DC

## 2017-01-27 MED FILL — ACETAMINOPHEN/COD #3 TABLET: 300-30 | 5 days supply | Qty: 28 | Fill #0

## 2017-01-27 NOTE — Progress Notes (Signed)
Cynthia Townsend    [id number]    11-03-1980  Primary Care Physician:Cynthia Townsend, Cynthia Headings, FNP  Referring Physician: Veryl Speak, FNP [address] [place], [place] [address]  Chief complaint:  Follow up for dyspnea  Profile Cynthia Townsend is a 36 year old female never regular smoker with fall > spring rhinitis all her life improved as an adult but dx of nasal polyps (on ASA daily with no h/o intol)   She's had episodes of recurrent bronchitis from October 2017. She was treated with antibiotics about 4 times and developed C. difficile after the last round. She had an episode of strep throat 2 weeks ago and got a course of Keflex. She was seen on 3/24 at urgent care for cough, sinus congestion, sore throat and marked dyspnea, chest tightness. She was started on prednisone with some improvement in symptoms. Chest x-ray at that time was suggestive of pulmonary edema but BNP was normal She had an evaluation at the cardiology office yesterday with an echocardiogram which was normal. She was started on Lasix and given albuterol inhaler.   She had an episode of pulmonary edema March 2017  after the birth of her daughter in the setting of preeclampsia. She improved with diuresis. She has minimal smoking history but quit several years ago. 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 She lives at an old home with leaking bathrooms and ceiling. She thinks that there may be mold issues. She has dogs at home but is not sensitive. No birds,hot tubs at home. No other known exposures. She had some issues with acid reflux during her pregnancy but no symptoms of the past year after delivery.  Interim History at ov 11/07/16  With f/u with completely nl pfts    Breathing is improved since his last visit. She reports some mild dyspnea with exertion, chronic cough with occasional white mucus mainly in the morning. She feels her cough is more related to her allergies rec We will get a  chest x-ray today to ensure that there lung infiltrate has improved Continue using the albuterol inhaler as needed I have reviewed your pulmonary function test which did not show any abnormality There is some slight enlargement of your thyroid. Will give an order for checking the thyroid function. Please follow up with her primary care regarding this.    01/27/2017 acute extended ov/Cynthia Townsend re: cough / sob during IUP/ [redacted] weeks gestation  Chief Complaint  Patient presents with  . Acute Visit    Pt states that she has been becoming SOB very easily recently. Pt went to PCP last Wednesday, 01/22/17 and dx was recurrent bronchitis w/ poss. strep. By Friday 01/24/17 pt states that she feels like it was pneumonia. PCP did not do a cxr when was there. Pt was prescribed z-pak to begin with but then med was changed to omnicef. Pt states that she is not any better since when saw PCP.  late  June 2018  Stuffy nose with yellow mucus > zpak and symptoms resolved, then end of July 2018 same problem recurred > another zpak but before could get it started due to delay had chest cough > nasty mucus > took zpak  No change mucus color but sinus problems improved > 01/20/17 developed sore throat > seen by Cynthia Townsend Ruiz 01/22/17 > omnicef started on 01/23/17 >  Gradually  sore throat much better, fever resolved but severe cough and doe  persist assoc with vomiting and urinating on herself Cough is worse supine / assoc with subjective wheeze  No obvious day to day or daytime variability or assoc current excess/ purulent sputum or mucus plugs or hemoptysis or cp or chest tightness,  or overt sinus or hb symptoms. No unusual exp hx or h/o childhood pna/ asthma or knowledge of premature birth.  Sleeping ok without nocturnal  or early am exacerbation  of respiratory  c/o's or need for noct saba. Also denies any obvious fluctuation of symptoms with weather or environmental changes or other aggravating or alleviating factors except as outlined  above   Current Medications, Allergies, Complete Past Medical History, Past Surgical History, Family History, and Social History were reviewed in [employer] record.  ROS  The following are not active complaints unless bolded sore throat, dysphagia, dental problems, itching, sneezing,  nasal congestion or excess/ purulent secretions, ear ache,   fever, chills, sweats, unintended wt loss, classically pleuritic or exertional cp,  orthopnea pnd or leg swelling, presyncope, palpitations, abdominal pain, anorexia, nausea, vomiting, diarrhea  or change in bowel or bladder habits, change in stools or urine, dysuria,hematuria,  rash, arthralgias, visual complaints, headache, numbness, weakness or ataxia or problems with walking or coordination,  change in mood/affect or memory.                  Physical Exam: amb wf with harsh upper airway cough   Wt Readings from Last 3 Encounters:  01/27/17 196 lb 9.6 oz (89.2 kg)  01/22/17 199 lb (90.3 kg)  11/07/16 181 lb (82.1 kg)    Vital signs reviewed  - Note on arrival 02 sats  96% on RA     HEENT: nl dentition, turbinates bilaterally, and oropharynx. Nl external ear canals without cough reflex   NECK :  without JVD/Nodes/TM/ nl carotid upstrokes bilaterally   LUNGS: no acc muscle use,  Nl contour chest with insp and exp wheezing   Bilaterally    CV:  RRR  no s3 or murmur or increase in P2, and no edema   ABD:  soft and nontender with nl inspiratory excursion in the supine position. No bruits or organomegaly appreciated, bowel sounds nl  MS:  Nl gait/ ext warm without deformities, calf tenderness, cyanosis or clubbing No obvious joint restrictions   SKIN: warm and dry without lesions    NEURO:  alert, approp, nl sensorium with  no motor or cerebellar deficits apparent.     Data Reviewed by [facility] 01/27/2017    High-resolution CT 09/07/16-no evidence of interstitial lung disease, left lower lobe consolidation, I have  reviewed all the images personally.  Echo 09/07/15-EF 55-60 percent, diastolic dysfunction with elevated LV filling pressure, mild MR, moderate left atrial enlargement, trivial TR, normal RVSP. Echo 09/03/16-EF 60-65% and no diastolic dysfunction. Normal RV systolic function. Normal study  FENO 09/04/16- 12  PFTs 11/07/16 FVC 2.50 [93%) FEV1 2.98 (96%) F/F 85 TLC 85% DLCO 98% Normal study/ no significant curvature    CXR PA and Lateral:   01/27/2017 :    I personally reviewed images and agree with radiology impression as follows:     Negative.  No acute cardiopulmonary abnormality.

## 2017-01-27 NOTE — Patient Instructions (Addendum)
symbicort 80 Take 2 puffs first thing in am and then another 2 puffs about 12 hours later.   For cough  >  mucinex dm 1200 mg every 12 hours and then add tylenol #3 one very 4 hours   GERD (REFLUX)  is an extremely common cause of respiratory symptoms just like yours , many times with no obvious heartburn at all.    It can be treated with medication, but also with lifestyle changes including elevation of the head of your bed (ideally with 6 inch  bed blocks),  Smoking cessation, avoidance of late meals, excessive alcohol, and avoid fatty foods, chocolate, peppermint, colas, red wine, and acidic juices such as orange juice.  NO MINT OR MENTHOL PRODUCTS SO NO COUGH DROPS   USE SUGARLESS CANDY INSTEAD (Jolley ranchers or Stover's or Life Savers) or even ice chips will also do - the key is to swallow to prevent all throat clearing. NO OIL BASED VITAMINS - use powdered substitutes.   [employer] as you plan and take the prednisone if not improving   Please remember to go to the  x-ray department downstairs in the basement  for your tests - we will call you with the results when they are available.  Please schedule a follow up office visit in 2 weeks, sooner if needed  To see one our nurse practioners

## 2017-01-27 NOTE — Progress Notes (Signed)
LMTCB

## 2017-01-28 ENCOUNTER — Encounter: Payer: Self-pay | Admitting: Internal Medicine

## 2017-01-28 ENCOUNTER — Telehealth: Payer: Self-pay | Admitting: Internal Medicine

## 2017-01-28 DIAGNOSIS — J45901 Unspecified asthma with (acute) exacerbation: Secondary | ICD-10-CM | POA: Insufficient documentation

## 2017-01-28 NOTE — Assessment & Plan Note (Signed)
01/27/2017  After extensive coaching HFA effectiveness =    75% > try symbicort 80 2bid   Despite reassuring pfts she does appear now to have acute AB aggravated by sinus dz and or GERD related to IUP so rec low dose ICS and go ahead with prednisone per Dr Raphael Gibney recs if not rapidly improving on symb  Discussed in detail all the  indications, usual  risks and alternatives  relative to the benefits with patient who agrees to proceed with rx in IUP given the risk to the baby of poorly controlled resp symptoms including violent coughing fits and threatened dehydration.   I had an extended discussion with the patient reviewing all relevant studies completed to date and  lasting 25 minutes of a 40  minute acute office visit with pt new to me    re  severe non-specific but potentially very serious refractory respiratory symptoms of uncertain and potentially multiple  etiologies.   Each maintenance medication was reviewed in detail including most importantly the difference between maintenance and prns and under what circumstances the prns are to be triggered using an action plan format that is not reflected in the computer generated alphabetically organized AVS.    Please see AVS for specific instructions unique to this office visit that I personally wrote and verbalized to the the pt in detail and then reviewed with pt  by my nurse highlighting any changes in therapy/plan of care  recommended at today's visit.

## 2017-01-28 NOTE — Telephone Encounter (Signed)
Pt is aware of results and voiced her understanding. Nothing further needed.    Notes recorded by Nyoka Cowden, MD on 01/27/2017 at 4:12 PM EDT Call pt: Reviewed cxr and no acute change so no change in recommendations made at ov (disregard previous result note dated 01/27/2017 )

## 2017-01-28 NOTE — Assessment & Plan Note (Addendum)
Severe and assoc with vomiting and urinary incont/ agree with Dr Isaiah Serge likely this is uacs ? Related to sinus dz initially and now with GERD secondary to IUP/ coughing   rec control with mucinex dm and codeine and avoid high dose ICS or DPI for asthma component and add gerd diet then add gerd rx if still coughing on symbicort 80 2bid   Assured her that omnicef is adequate for now ? Needs ENT f/u at some point vs sinus ct but IUP a concern re more aggressive abx or radiologic studies   F/u should be in 2 weeks to assure response to rx - call sooner if needed

## 2017-02-12 ENCOUNTER — Ambulatory Visit (INDEPENDENT_AMBULATORY_CARE_PROVIDER_SITE_OTHER): Payer: 59 | Admitting: Acute Care

## 2017-02-12 ENCOUNTER — Encounter: Payer: Self-pay | Admitting: Acute Care

## 2017-02-12 VITALS — BP 118/74 | HR 101 | Temp 96.8°F | Ht 63.0 in | Wt 200.8 lb

## 2017-02-12 DIAGNOSIS — R0981 Nasal congestion: Secondary | ICD-10-CM | POA: Diagnosis not present

## 2017-02-12 DIAGNOSIS — J339 Nasal polyp, unspecified: Secondary | ICD-10-CM | POA: Diagnosis not present

## 2017-02-12 DIAGNOSIS — R05 Cough: Secondary | ICD-10-CM | POA: Diagnosis not present

## 2017-02-12 DIAGNOSIS — R059 Cough, unspecified: Secondary | ICD-10-CM

## 2017-02-12 MED ORDER — BUDESONIDE-FORMOTEROL FUMARATE 80-4.5 MCG/ACT IN AERO: 2.0000 | INHALATION_SPRAY | Freq: Two times a day (BID) | RESPIRATORY_TRACT | 3 refills | Status: DC

## 2017-02-12 NOTE — Progress Notes (Signed)
History of Present Illness Cynthia Townsend is a 36 y.o. female former smoker ( remote) with rhinitis and recurrent bronchitis. She is followed  by Dr. Isaiah SergeMannam.  Profile 08/2016 Cynthia Townsend is a 36 year old female never regular smoker with fall > spring rhinitis all her life improved as an adult but dx of nasal polyps (on ASA daily with no h/o intol)   She's had episodes of recurrent bronchitis from October 2017. She was treated with antibiotics about 4 times and developed C. difficile after the last round. She had an episode of strep throat 2 weeks ago and got a course of Keflex. She was seen on 3/24 at urgent care for cough, sinus congestion, sore throat and marked dyspnea, chest tightness. She was started on prednisone with some improvement in symptoms. Chest x-ray at that time was suggestive of pulmonary edema but BNP was normal She had an evaluation at the cardiology office yesterday with an echocardiogram which was normal. She was started on Lasix and given albuterol inhaler.   She had an episode of pulmonary edema March 2017  after the birth of her daughter in the setting of preeclampsia. She improved with diuresis. She has minimal smoking history but quit several years ago. 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 She lives at an old home with leaking bathrooms and ceiling. She thinks that there may be mold issues. She has dogs at home but is not sensitive. No birds,hot tubs at home. No other known exposures. She had some issues with acid reflux during her pregnancy but no symptoms of the past year after delivery.  Interim History at ov 11/07/16  With f/u with completely nl pfts    Breathing is improved since  last visit 09/04/2016. She reports some mild dyspnea with exertion, chronic cough with occasional white mucus mainly in the morning. She feels her cough is more related to her allergies rec We will get a chest x-ray today to ensure that there lung infiltrate  has improved Continue using the albuterol inhaler as needed Pulmonary function tests are normal There is some slight enlargement of your thyroid. Will give an order for checking the thyroid function. Please follow up with her primary care regarding this.   01/27/2017 Acute extended ov/Wert re: cough / sob during IUP/ [redacted] weeks gestation :  History of present illness since June 2018: Late  June 2018  Stuffy nose with yellow mucus > zpak and symptoms resolved, then end of July 2018 same problem recurred > another zpak but before could get it started due to delay had chest cough > nasty mucus > took zpak  No change mucus color but sinus problems improved > 01/20/17 developed sore throat > seen by Dr Jonny RuizJohn 01/22/17 > omnicef started on 01/23/17 >  Gradually  sore throat much better, fever resolved but severe cough and doe persist assoc with vomiting and urinating on herself Cough is worse supine / assoc with subjective wheeze  Recommendations after visit:  symbicort 80 Take 2 puffs first thing in am and then another 2 puffs about 12 hours later.  For cough > mucinex dm 1200 mg every 12 hours and then add tylenol #3 one very 4 hours  GERD (REFLUX) is an extremely common cause of respiratory symptoms just like yours , many times with no obvious heartburn at all.  It can be treated with medication, but also with lifestyle changes including elevation of the head of your bed (ideally  with 6 inch bed blocks), Smoking cessation, avoidance of late meals, excessive alcohol, and avoid fatty foods, chocolate, peppermint, colas, red wine, and acidic juices such as orange juice.  NO MINT OR MENTHOL PRODUCTS SO NO COUGH DROPS  USE SUGARLESS CANDY INSTEAD (Jolley ranchers or Stover's or Life Savers) or even ice chips will also do - the key is to swallow to prevent all throat clearing. NO OIL BASED VITAMINS - use powdered substitutes.  [employer] as you plan and take the prednisone if not improving  Please  remember to go to the x-ray department downstairs in the basement for your tests - we will call you with the results when they are available.  Please schedule a follow up office v    02/12/2017 3 week follow up after 01/27/2017 visit with Dr. Wert.>> [redacted] Weeks Gestation Pt. Presents for follow up.She completed her Omnicef which she was taking for strept throat and bronchitis. Bronchitis is better. She states she felt the therapeutic trial of Symbicort did help her breathing, and decrease her cough. She lives in a rental house. There has been confirmation of mold.She has been using her Mucinex daily.She has been following the reflux diet, and using hard candies for throat soothing. She has thick yellow productive cough in the morning.She has clear nasal drainage. This Sunday she has developed a new dry cough which she states is usually the beginning of bronchitis.Cough is better, but she is concerned about the dry cough she started Sunday.She denies fever, chest pain, orthopnea or hemoptysis. She has significant postnasal drip, and sinus stuffiness. She has nasal polyps that were diagnosed in the past. She is currently not taking any medications for this.She has never had allergy testing.   Test Results: 01/27/2017>> chest x-ray IMPRESSION: Negative.  No acute cardiopulmonary abnormality.  High-resolution CT 09/07/16-no evidence of interstitial lung disease, left lower lobe consolidation,  Echo 09/07/15-EF 55-60 percent, diastolic dysfunction with elevated LV filling pressure, mild MR, moderate left atrial enlargement, trivial TR, normal RVSP.  Echo 09/03/16-EF 60-65% and no diastolic dysfunction. Normal RV systolic function. Normal study  FENO 09/04/16- 12  PFTs 11/07/16 FVC 2.50 [93%) FEV1 2.98 (96%) F/F 85 TLC 85% DLCO 98% Normal study/ no significant curvature   CBC Latest Ref Rng & Units 01/23/2017 09/06/2015 09/05/2015  WBC 3.4 - 10.8 x10E3/uL 7.4 9.6 14.5(H)  Hemoglobin 11.1 - 15.9 g/dL  14.7 8.0(L) 8.5(L)  Hematocrit 34.0 - 46.6 % 35.3 25.0(L) 26.2(L)  Platelets 150 - 379 x10E3/uL 219 270 326    BMP Latest Ref Rng & Units 09/10/2016 09/06/2015 09/05/2015  Glucose 65 - 99 mg/dL 81 82 94  BUN 6 - 20 mg/dL 13 12 9   Creatinine 0.57 - 1.00 mg/dL 8.29 5.62 1.30(Q)  BUN/Creat Ratio 9 - 23 21 - -  Sodium 134 - 144 mmol/L 134 140 141  Potassium 3.5 - 5.2 mmol/L 4.1 3.6 4.2  Chloride 96 - 106 mmol/L 93(L) 106 104  CO2 18 - 29 mmol/L 22 25 26   Calcium 8.7 - 10.2 mg/dL 9.3 6.5(H) 8.4(O)    BNP    Component Value Date/Time   BNP 54.4 08/31/2016 1829    PFT    Component Value Date/Time   FEV1PRE 2.83 11/07/2016 1050   FEV1POST 2.98 11/07/2016 1050   FVCPRE 3.45 11/07/2016 1050   FVCPOST 3.50 11/07/2016 1050   TLC 4.34 11/07/2016 1050   DLCOUNC 23.86 11/07/2016 1050   PREFEV1FVCRT 82 11/07/2016 1050   PSTFEV1FVCRT 85 11/07/2016 1050  Dg Chest 2 View  Result Date: 01/27/2017 CLINICAL DATA:  36 year old female with cough congestion and fever for 1 week. Finished a course of antibiotics. EXAM: CHEST  2 VIEW COMPARISON:  11/07/2016 and earlier. FINDINGS: Stable lung volumes. Normal cardiac size and mediastinal contours. Visualized tracheal air column is within normal limits. No pneumothorax, pulmonary edema, pleural effusion or confluent pulmonary opacity. Radiographic appearance of increased pulmonary interstitium is stable. No acute osseous abnormality identified. Negative visible bowel gas pattern. IMPRESSION: Negative.  No acute cardiopulmonary abnormality. Electronically Signed   By: Odessa Fleming M.D.   On: 01/27/2017 16:01     Past medical hx Past Medical History:  Diagnosis Date  . ADD (attention deficit disorder)   . Anxiety   . Chicken pox   . Classic migraine with aura 09/20/2015  . Depression   . Ectopic pregnancy   . Insomnia   . Nasal polyps   . Pregnancy induced hypertension   . Sciatic pain   . Subclinical hypothyroidism   . Vitamin D deficiency       Social History  Substance Use Topics  . Smoking status: Former Smoker    Types: Cigarettes  . Smokeless tobacco: Never Used  . Alcohol use No    Ms.Ibe reports that she has quit smoking. Her smoking use included Cigarettes. She has never used smokeless tobacco. She reports that she does not drink alcohol or use drugs.  Tobacco Cessation: Remote smoking history quit several years ago  Past surgical hx, Family hx, Social hx all reviewed.  Current Outpatient Prescriptions on File Prior to Visit  Medication Sig  . acetaminophen (TYLENOL) 500 MG tablet Take 1,000 mg by mouth as needed.  Marland Kitchen acetaminophen-codeine (TYLENOL #3) 300-30 MG tablet One every 4 hours as needed for cough  . ALPRAZolam (XANAX) 0.5 MG tablet TAKE 1 TABLET BY MOUTH ONCE DAILY AS NEEDED FOR ANXIETY  . amphetamine-dextroamphetamine (ADDERALL) 10 MG tablet Take 1 tablet (10 mg total) by mouth daily as needed.  Marland Kitchen amphetamine-dextroamphetamine (ADDERALL) 30 MG tablet Take 1 tablet by mouth daily.  . butalbital-acetaminophen-caffeine (FIORICET, ESGIC) 50-325-40 MG tablet Take 1 tablet by mouth every 4 (four) hours as needed for headache.  . dextromethorphan-guaiFENesin (MUCINEX DM) 30-600 MG 12hr tablet Take 1 tablet by mouth as needed for cough.  . traZODone (DESYREL) 100 MG tablet Take 3 tablets (300 mg total) by mouth at bedtime.   No current facility-administered medications on file prior to visit.      Allergies  Allergen Reactions  . Penicillins Shortness Of Breath, Swelling and Other (See Comments)    Has patient had a PCN reaction causing immediate rash, facial/tongue/throat swelling, SOB or lightheadedness with hypotension:Unknown Has patient had a PCN reaction causing severe rash involving mucus membranes or skin necrosis: unknown Has patient had a PCN reaction that required hospitalization unknown Has patient had a PCN reaction occurring within the last 10 years: uknown If all of the above answers are  "NO", then may proceed with Cephalosporin use.     . Sulfa Antibiotics Hives and Rash    Review Of Systems:  Constitutional:   No  weight loss, night sweats,  Fevers, chills,+ fatigue, or  lassitude.  HEENT:   No headaches,  Difficulty swallowing,  Tooth/dental problems, or  Sore throat,                No sneezing, itching, ear ache, +nasal congestion,+ post nasal drip,   CV:  No chest pain,  Orthopnea, PND, swelling  in lower extremities, anasarca, dizziness, palpitations, syncope.   GI  No heartburn, indigestion, abdominal pain, nausea, vomiting, diarrhea, change in bowel habits, loss of appetite, bloody stools.   Resp: +, but improved  shortness of breath with exertion not  at rest.  + excess mucus, + productive cough,  + non-productive cough,  No coughing up of blood.  No change in color of mucus.  No wheezing.  No chest wall deformity  Skin: no rash or lesions.  GU: no dysuria, change in color of urine, no urgency or frequency.  No flank pain, no hematuria   MS:  No joint pain or swelling.  No decreased range of motion.  No back pain.  Psych:  No change in mood or affect. No depression or anxiety.  No memory loss.   Vital Signs BP 118/74 (BP Location: Right Arm, Cuff Size: Normal)   Pulse (!) 101   Temp (!) 96.8 F (36 C) (Oral)   Ht 5\' 3"  (1.6 m)   Wt 200 lb 12.8 oz (91.1 kg)   LMP 10/30/2016 (Approximate)   SpO2 98%   BMI 35.57 kg/m    Physical Exam:  General- No distress,  A&Ox3, pleasant pregnant female ENT: No sinus tenderness, TM clear, pale nasal mucosa, no oral exudate,+ post nasal drip, no LAN Cardiac: S1, S2, regular rate and rhythm, no murmur Chest: No wheeze/ rales/ dullness; no accessory muscle use, no nasal flaring, no sternal retractions Abd.: Soft Non-tender, appropriately distended with pregnancy Ext: No clubbing cyanosis, edema Neuro:  normal strength Skin: No rashes, warm and dry Psych: normal mood and  behavior   Assessment/Plan  Cough Improved after use of Symbicort 80/4.5  twice a day Compliant with Mucinex Compliant with GERD diet Plan Continue Mucinex as you have been doing Follow reflux diet as you have been doing. We will write a prescription for Symbicort 80/ 4.5 . Take 2 puffs twice daily. Rinse mouth after use. Flonase 2 puff per nostril daily Claritin 10 mg daily ( in the morning) Check with OB about taking any new medications( Symbicort,Claritin or Flonase). Follow up wth Dr. Isaiah Serge in 2-3 months Refer to ENT for nasal congestion and polyps. Please contact office for sooner follow up if symptoms do not improve or worsen or seek emergency care .   Nasal polyps Referral to ENT  Symbicort: Pregnancy category B Claritin: Pregnancy category B Flonase: Pregnancy category B Mucinex: Safe and second and third trimester  Bevelyn Ngo, NP 02/12/2017  8:29 PM

## 2017-02-12 NOTE — Assessment & Plan Note (Signed)
Improved after use of Symbicort 80/4.5  twice a day Compliant with Mucinex Compliant with GERD diet Plan Continue Mucinex as you have been doing Follow reflux diet as you have been doing. We will write a prescription for Symbicort 80/ 4.5 . Take 2 puffs twice daily. Rinse mouth after use. Flonase 2 puff per nostril daily Claritin 10 mg daily ( in the morning) Check with OB about taking any new medications( Symbicort,Claritin or Flonase). Follow up wth Dr. Isaiah SergeMannam in 2-3 months Refer to ENT for nasal congestion and polyps. Please contact office for sooner follow up if symptoms do not improve or worsen or seek emergency care .

## 2017-02-12 NOTE — Assessment & Plan Note (Signed)
Referral to ENT °

## 2017-02-12 NOTE — Patient Instructions (Addendum)
It is nice to meet you today. Continue Mucinex as you have been doing Follow reflux diet as you have been doing. We will write a prescription for Symbicort 80/ 4.5 . Take 2 puffs twice daily. Rinse mouth after use. Flonase 2 puff per nostril daily Claritin 10 mg daily ( in the morning) Check with OB about taking any new medications( Symbicort,Claritin or Flonase). Follow up wth Dr. Isaiah SergeMannam in 2-3 months Refer to ENT for nasal congestion and polyps. Please contact office for sooner follow up if symptoms do not improve or worsen or seek emergency care .

## 2017-02-18 ENCOUNTER — Telehealth: Payer: Self-pay | Admitting: *Deleted

## 2017-02-18 NOTE — Telephone Encounter (Signed)
Rx oxycodone-acetaminophen printed on 11/03/15 destroyed since patient never pick up from the office.

## 2017-02-25 DIAGNOSIS — J351 Hypertrophy of tonsils: Secondary | ICD-10-CM | POA: Diagnosis not present

## 2017-02-25 DIAGNOSIS — T7840XA Allergy, unspecified, initial encounter: Secondary | ICD-10-CM | POA: Insufficient documentation

## 2017-02-25 DIAGNOSIS — J343 Hypertrophy of nasal turbinates: Secondary | ICD-10-CM | POA: Diagnosis not present

## 2017-02-25 DIAGNOSIS — Z7289 Other problems related to lifestyle: Secondary | ICD-10-CM | POA: Diagnosis not present

## 2017-02-25 DIAGNOSIS — J302 Other seasonal allergic rhinitis: Secondary | ICD-10-CM | POA: Diagnosis not present

## 2017-03-04 DIAGNOSIS — Z363 Encounter for antenatal screening for malformations: Secondary | ICD-10-CM | POA: Diagnosis not present

## 2017-03-04 DIAGNOSIS — Z3A18 18 weeks gestation of pregnancy: Secondary | ICD-10-CM | POA: Diagnosis not present

## 2017-03-04 DIAGNOSIS — Z3400 Encounter for supervision of normal first pregnancy, unspecified trimester: Secondary | ICD-10-CM | POA: Diagnosis not present

## 2017-03-05 ENCOUNTER — Encounter: Payer: Self-pay | Admitting: Cardiology

## 2017-03-13 ENCOUNTER — Ambulatory Visit (INDEPENDENT_AMBULATORY_CARE_PROVIDER_SITE_OTHER): Payer: 59 | Admitting: Cardiology

## 2017-03-13 VITALS — BP 130/66 | HR 89 | Ht 63.0 in | Wt 208.0 lb

## 2017-03-13 DIAGNOSIS — O132 Gestational [pregnancy-induced] hypertension without significant proteinuria, second trimester: Secondary | ICD-10-CM

## 2017-03-13 DIAGNOSIS — Z3A21 21 weeks gestation of pregnancy: Secondary | ICD-10-CM

## 2017-03-13 DIAGNOSIS — J209 Acute bronchitis, unspecified: Secondary | ICD-10-CM

## 2017-03-13 NOTE — Patient Instructions (Signed)
Medication Instructions:  TAKE Allegra daily for your allergies   Labwork: None Ordered   Testing/Procedures: None Ordered   Follow-Up: Your physician recommends that you schedule a follow-up appointment in: 3 months with Dr. Delton See   If you need a refill on your cardiac medications before your next appointment, please call your pharmacy.   Thank you for choosing [facility]! Eligha Bridegroom, RN [phone]

## 2017-03-13 NOTE — Progress Notes (Signed)
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 The patient had first problem after she delivered her baby last year when she was diagnosed with postpartum preeclampsia. She was admitted with acute pulmonary edema and diuresed. She was doing okay since then with mild lower extremity edema. Her problem started again in October of this year when she presented with fever and cough and was prescribed azithromycin for acute bronchitis this was followed by 3 similar episodes and she was prescribed Levaquin 3 times. On 08/21/2016 she was diagnosed with strep throat and was treated with Keflex. This last Saturday she went to the urgent care with significant shortness of breath and worsening lower extremity edema inability to lay flat and was given a prescription for doxycycline and prednisone for acute bronchitis with bronchospasm.  03/13/2017 - this is 6 months follow-up, the patient is currently pregnant [redacted] weeks, she is feeling well, however since I saw her last time she had to episodes of pneumonia requiring antibiotic treatment. The patient is suspicious that mold in her rented house is causing some of the symptoms. She had pleuritic type of chest pain while she was diagnosed with pneumonia otherwise no exertional chest pain, she gets short of breath on moderate exertion and doesn't have any lower extremity edema no orthopnea paroxysmal nocturnal dyspnea. She is using Symbicort to maintain her asthma under control currently stable. The only concern right now is nasal congestion and wants advice on appropriate antihistaminics during pregnancy. She is scheduled to see an allergist. She gets very occasional  palpitations not associated with any other symptoms such as dizziness or chest pain no syncope.  Past Medical History:  Diagnosis Date  . ADD (attention deficit disorder)   . Anxiety   . Chicken pox   . Classic migraine with aura 09/20/2015  . Depression   . Ectopic pregnancy   . Insomnia   . Nasal polyps   . Pregnancy induced hypertension   . Sciatic pain   . Subclinical hypothyroidism   . Vitamin D deficiency     Past Surgical History:  Procedure Laterality Date  . BREAST BIOPSY     benign  punch bx  . DILATION AND EVACUATION N/A 09/04/2015   Procedure: DILATATION AND EVACUATION;  Surgeon: Mitchel Honour, DO;  Location: [facility];  Service: Gynecology;  Laterality: N/A;  . DNC      Current Medications: Outpatient Medications Prior to Visit  Medication Sig Dispense Refill  . acetaminophen (TYLENOL) 500 MG tablet Take 1,000 mg by mouth as needed.    Marland Kitchen acetaminophen-codeine (TYLENOL #3) 300-30 MG tablet One every 4 hours as needed for cough 28 tablet 0  . aspirin 81 MG chewable tablet Chew 81 mg by mouth daily.    . budesonide-formoterol (SYMBICORT) 80-4.5 MCG/ACT inhaler Inhale 2 puffs into the lungs 2 (two) times daily. 1 Inhaler 3  . butalbital-acetaminophen-caffeine (FIORICET, ESGIC) 50-325-40 MG tablet Take 1 tablet by mouth every 4 (four) hours as needed for headache. 30 tablet 1  . dextromethorphan-guaiFENesin (MUCINEX DM) 30-600 MG 12hr tablet Take 1 tablet by mouth as needed for cough.    . ALPRAZolam (XANAX) 0.5 MG  tablet TAKE 1 TABLET BY MOUTH ONCE DAILY AS NEEDED FOR ANXIETY (Patient not taking: Reported on 03/13/2017) 30 tablet 0  . amphetamine-dextroamphetamine (ADDERALL) 10 MG tablet Take 1 tablet (10 mg total) by mouth daily as needed. (Patient not taking: Reported on 03/13/2017) 30 tablet 0  . amphetamine-dextroamphetamine (ADDERALL) 30 MG tablet Take 1 tablet by mouth daily. (Patient not taking: Reported on 03/13/2017) 30 tablet 0  . traZODone (DESYREL) 100 MG tablet  Take 3 tablets (300 mg total) by mouth at bedtime. (Patient not taking: Reported on 03/13/2017) 270 tablet 1   No facility-administered medications prior to visit.      Allergies:   Penicillins; Sulfa antibiotics; and Sulfamethoxazole   Social History   Social History  . Marital status: Single    Spouse name: N/A  . Number of children: 0  . Years of education: 5   Occupational History  . 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 Smoking status: Former Smoker    Types: Cigarettes  . Smokeless tobacco: Never Used  . Alcohol use No  . Drug use: No  . Sexual activity: Yes    Birth control/ protection: None   Other Topics Concern  . Not on file   Social History Narrative   Lives at home w/ her fiance and new daughter.   Right-handed.   Drinks about 1-2 cups of caffeine per day.   Fun: Hiking, Zumba, sleep   Denies religious beliefs effecting health care.   Denies abuse and feels safe at home.      Family History:  The patient's family history includes Alcohol abuse in her mother; COPD in her mother; Diabetes in her sister; Heart attack in her sister; Heart disease in her father, maternal uncle, mother, and sister; Hyperlipidemia in her father and mother; Hyperthyroidism in her mother; Lung cancer in her mother; Mental illness in her mother and sister; Pulmonary embolism in her father; Rheum arthritis in her maternal grandmother; Transient ischemic attack in her father.   ROS:   Please see the history of present illness.    ROS All other systems reviewed and are negative.   PHYSICAL EXAM:   VS:  BP 130/66   Pulse 89   Ht  (1.6 m)   Wt 208 lb (94.3 kg)   LMP 10/30/2016 (Approximate)   BMI 36.85 kg/m    GEN: Well nourished, well developed, in no acute distress  HEENT: normal  Neck: no JVD, carotid bruits, or masses Cardiac: RRR; no murmurs, rubs, or gallops,no edema  Respiratory:  Clear to auscultation bilaterally no wheezes GI: soft, nontender,  consistent with 21 weeks of pregnancy, + BS MS: no deformity or atrophy  Skin: warm and dry, no rash Neuro:  Alert and Oriented x 3, Strength and sensation are intact Psych: euthymic mood, full affect  Wt Readings from Last 3 Encounters:  03/13/17 208 lb (94.3 kg)  02/12/17 200 lb 12.8 oz (91.1 kg)  01/27/17 196 lb 9.6 oz (89.2 kg)     Studies/Labs Reviewed:   EKG:  EKG is ordered today.  The ekg ordered today demonstrates Normal sinus rhythm normal EKG.  Recent Labs: 08/31/2016: Brain Natriuretic Peptide 54.4 09/10/2016: BUN 13; Creatinine, Ser 0.61; Potassium 4.1; Sodium 134 11/07/2016: TSH 2.870 01/23/2017: Hemoglobin 12.2; Platelets 219   Lipid Panel No results found for: CHOL, TRIG, HDL, CHOLHDL, VLDL, LDLCALC, LDLDIRECT  Additional studies/ records that were reviewed today include:   TTE: 09/07/2015 -  Left ventricle: The cavity size was normal. Wall thickness was   normal. Systolic function was normal. The estimated ejection   fraction was in the range of 55% to 60%. Wall motion was normal;   there were no regional wall motion abnormalities. Doppler   parameters are consistent with abnormal left ventricular   relaxation (grade 1 diastolic dysfunction). The E/e&' ratio is   >15, suggesting elevated LV filling pressure. - Mitral valve: Mildly thickened leaflets . There was mild   regurgitation. - Left atrium: Moderately dilated at 41 ml/m2. - Tricuspid valve: There was trivial regurgitation. - Pulmonary arteries: PA peak pressure: 16 mm Hg (S). - Inferior vena cava: The vessel was normal in size. The   respirophasic diameter changes were in the normal range (>= 50%),   consistent with normal central venous pressure.  Impressions:  - LVEF 55-60%, normal wall thickness,normal wall motion, diastolic   dysfunction with elevated LV filling pressure, mild MR, moderate   LAE, trivial TR, normal RVSP.    ASSESSMENT:    1. Pregnancy-induced hypertension in second  trimester   2. Bronchospasm with bronchitis, acute   3. Pregnancy with 21 completed weeks gestation      PLAN:  In order of problems listed above:  1. Shortness of breath - at the last visit we perform echocardiogram that was completely normal with normal systolic diastolic function. It appears that her symptoms are related to underlying asthma that get exacerbated with episodes of pneumonia. I will follow her through some of the pregnancy but she has absolutely no signs of heart failure. 2. Asthma - followed by pulmonary, currently advised to use Symbicort that's the safest option during pregnancy. 3. Blood pressure is well controlled, she had hypertension in a prior pregnancy but currently normal on no medication.  Follow-up in 3 months.  Medication Adjustments/Labs and Tests Ordered: Current medicines are reviewed at length with the patient today.  Concerns regarding medicines are outlined above.  Medication changes, Labs and Tests ordered today are listed in the Patient Instructions below. Patient Instructions  Medication Instructions:  TAKE Allegra daily for your allergies   Labwork: None Ordered   Testing/Procedures: None Ordered   Follow-Up: Your physician recommends that you schedule a follow-up appointment in: 3 months with Dr. Delton See   If you need a refill on your cardiac medications before your next appointment, please call your pharmacy.   Thank you for choosing [facility]! Eligha Bridegroom, RN [phone]       Signed, Tobias Alexander, MD  03/13/2017 1:40 PM    [facility] [address] [place], [place], [place]  [address] Phone: [phone]; Fax: [phone]

## 2017-03-22 DIAGNOSIS — W540XXA Bitten by dog, initial encounter: Secondary | ICD-10-CM | POA: Diagnosis not present

## 2017-03-22 DIAGNOSIS — O26899 Other specified pregnancy related conditions, unspecified trimester: Secondary | ICD-10-CM | POA: Diagnosis not present

## 2017-03-22 DIAGNOSIS — Z3A00 Weeks of gestation of pregnancy not specified: Secondary | ICD-10-CM | POA: Diagnosis not present

## 2017-03-22 DIAGNOSIS — S61251A Open bite of left index finger without damage to nail, initial encounter: Secondary | ICD-10-CM | POA: Diagnosis not present

## 2017-03-22 DIAGNOSIS — Z23 Encounter for immunization: Secondary | ICD-10-CM | POA: Diagnosis not present

## 2017-03-22 DIAGNOSIS — Z91048 Other nonmedicinal substance allergy status: Secondary | ICD-10-CM | POA: Diagnosis not present

## 2017-03-23 DIAGNOSIS — Z23 Encounter for immunization: Secondary | ICD-10-CM | POA: Diagnosis not present

## 2017-03-23 DIAGNOSIS — S6991XA Unspecified injury of right wrist, hand and finger(s), initial encounter: Secondary | ICD-10-CM | POA: Diagnosis not present

## 2017-03-23 DIAGNOSIS — S61211A Laceration without foreign body of left index finger without damage to nail, initial encounter: Secondary | ICD-10-CM | POA: Diagnosis not present

## 2017-03-23 DIAGNOSIS — S61251A Open bite of left index finger without damage to nail, initial encounter: Secondary | ICD-10-CM | POA: Diagnosis not present

## 2017-03-23 DIAGNOSIS — M7989 Other specified soft tissue disorders: Secondary | ICD-10-CM | POA: Diagnosis not present

## 2017-03-23 DIAGNOSIS — Z91048 Other nonmedicinal substance allergy status: Secondary | ICD-10-CM | POA: Diagnosis not present

## 2017-03-25 ENCOUNTER — Ambulatory Visit (INDEPENDENT_AMBULATORY_CARE_PROVIDER_SITE_OTHER): Payer: 59 | Admitting: Internal Medicine

## 2017-03-25 ENCOUNTER — Encounter: Payer: Self-pay | Admitting: Internal Medicine

## 2017-03-25 ENCOUNTER — Ambulatory Visit (INDEPENDENT_AMBULATORY_CARE_PROVIDER_SITE_OTHER)
Admission: RE | Admit: 2017-03-25 | Discharge: 2017-03-25 | Disposition: A | Discharge status: Home / Self Care | Payer: 59 | Source: Ambulatory Visit | Attending: Internal Medicine | Admitting: Internal Medicine

## 2017-03-25 VITALS — BP 136/84 | HR 90 | Temp 97.9°F | Ht 63.0 in | Wt 189.0 lb

## 2017-03-25 DIAGNOSIS — W540XXD Bitten by dog, subsequent encounter: Secondary | ICD-10-CM

## 2017-03-25 DIAGNOSIS — W540XXA Bitten by dog, initial encounter: Secondary | ICD-10-CM

## 2017-03-25 DIAGNOSIS — L03012 Cellulitis of left finger: Secondary | ICD-10-CM | POA: Insufficient documentation

## 2017-03-25 DIAGNOSIS — M7989 Other specified soft tissue disorders: Secondary | ICD-10-CM | POA: Diagnosis not present

## 2017-03-25 DIAGNOSIS — S61251A Open bite of left index finger without damage to nail, initial encounter: Secondary | ICD-10-CM

## 2017-03-25 DIAGNOSIS — S61251S Open bite of left index finger without damage to nail, sequela: Secondary | ICD-10-CM | POA: Diagnosis not present

## 2017-03-25 MED ORDER — CLINDAMYCIN HCL 300 MG PO CAPS: 300.0000 mg | ORAL_CAPSULE | Freq: Three times a day (TID) | ORAL | 0 refills | Status: DC

## 2017-03-25 MED FILL — CLINDAMYCIN HCL 300 MG CAPS: 300 | 10 days supply | Qty: 30 | Fill #0

## 2017-03-25 NOTE — Assessment & Plan Note (Signed)
Ok for cleocin asd, to ED or Hydrographic surveyor if worse

## 2017-03-25 NOTE — Assessment & Plan Note (Signed)
As above.

## 2017-03-25 NOTE — Progress Notes (Signed)
Subjective:    Patient ID: Cynthia Townsend, female    DOB: [birth year], 36 y.o.   MRN: [id number]  HPI  Here to f/u after a dog bite to left index finger occurred sat oct 12, seen at [facility] and dissappointed that she was only given 2 days cleocin 300 tid and no further instructions per pt.  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  Denies high fever, chills.  Has PCN allergy and [redacted] wks pregnant  - [id number].  Pt denies chest pain, increased sob or doe, wheezing, orthopnea, PND, increased LE swelling, palpitations, dizziness or syncope. Past Medical History:  Diagnosis Date  . ADD (attention deficit disorder)   . Anxiety   . Chicken pox   . Classic migraine with aura 09/20/2015  . Depression   . Ectopic pregnancy   . Insomnia   . Nasal polyps   . Pregnancy induced hypertension   . Sciatic pain   . Subclinical hypothyroidism   . Vitamin D deficiency    Past Surgical History:  Procedure Laterality Date  . BREAST BIOPSY     benign  punch bx  . DILATION AND EVACUATION N/A 09/04/2015   Procedure: DILATATION AND EVACUATION;  Surgeon: Mitchel Honour, DO;  Location: [facility];  Service: Gynecology;  Laterality: N/A;  . DNC      reports that she has quit smoking. Her smoking use included Cigarettes. She has never used smokeless tobacco. She reports that she does not drink alcohol or use drugs. family history includes Alcohol abuse in her mother; COPD in her mother; Diabetes in her sister; Heart attack in her sister; Heart disease in her father, maternal uncle, mother, and sister; Hyperlipidemia in her father and mother; Hyperthyroidism in her mother; Lung cancer in her mother; Mental illness in her mother and sister; Pulmonary embolism in her father; Rheum  arthritis in her maternal grandmother; Transient ischemic attack in her father. Allergies  Allergen Reactions  . Penicillins Shortness Of Breath, Swelling, Other (See Comments) and Rash    Other reaction(s): Other (See Comments) Has patient had a PCN reaction causing immediate rash, facial/tongue/throat swelling, SOB or lightheadedness with hypotension:Unknown Has patient had a PCN reaction causing severe rash involving mucus membranes or skin necrosis: unknown Has patient had a PCN reaction that required hospitalization unknown Has patient had a PCN reaction occurring within the last 10 years: uknown If all of the above answers are "NO", then may proceed with Cephalosporin use. Has patient had a PCN reaction causing immediate rash, facial/tongue/throat swelling, SOB or lightheadedness with hypotension:Unknown Has patient had a PCN reaction causing severe rash involving mucus membranes or skin necrosis: unknown Has patient had a PCN reaction that required hospitalization unknown Has patient had a PCN reaction occurring within the last 10 years: uknown If all of the above answers are "NO", then may proceed with Cephalosporin use.     . Sulfa Antibiotics Hives and Rash  . Sulfamethoxazole Rash   Current Outpatient Prescriptions on File Prior to Visit  Medication Sig Dispense Refill  . acetaminophen (TYLENOL) 500 MG tablet Take 1,000 mg by mouth as needed.    Marland Kitchen acetaminophen-codeine (TYLENOL #3) 300-30 MG tablet One every 4 hours as needed for cough 28 tablet 0  . ALPRAZolam Prudy Feeler)  0.5 MG tablet TAKE 1 TABLET BY MOUTH ONCE DAILY AS NEEDED FOR ANXIETY 30 tablet 0  . amphetamine-dextroamphetamine (ADDERALL) 10 MG tablet Take 1 tablet (10 mg total) by mouth daily as needed. 30 tablet 0  . amphetamine-dextroamphetamine (ADDERALL) 30 MG tablet Take 1 tablet by mouth daily. 30 tablet 0  . aspirin 81 MG chewable tablet Chew 81 mg by mouth daily.    . budesonide-formoterol (SYMBICORT) 80-4.5  MCG/ACT inhaler Inhale 2 puffs into the lungs 2 (two) times daily. 1 Inhaler 3  . butalbital-acetaminophen-caffeine (FIORICET, ESGIC) 50-325-40 MG tablet Take 1 tablet by mouth every 4 (four) hours as needed for headache. 30 tablet 1  . dextromethorphan-guaiFENesin (MUCINEX DM) 30-600 MG 12hr tablet Take 1 tablet by mouth as needed for cough.    . traZODone (DESYREL) 100 MG tablet Take 3 tablets (300 mg total) by mouth at bedtime. 270 tablet 1   No current facility-administered medications on file prior to visit.    Review of Systems  All other system neg per pt    Objective:   Physical Exam BP 136/84   Pulse 90   Temp 97.9 F (36.6 C) (Oral)   Ht  (1.6 m)   Wt 189 lb (85.7 kg)   LMP 10/30/2016 (Approximate)   SpO2 100%   BMI 33.48 kg/m  VS noted,  Constitutional: Pt appears in NAD HENT: Head: NCAT.  Right Ear: External ear normal.  Left Ear: External ear normal.  Eyes: . Pupils are equal, round, and reactive to light. Conjunctivae and EOM are normal Nose: without d/c or deformity Neck: Neck supple. Gross normal ROM Cardiovascular: Normal rate and regular rhythm.   Pulmonary/Chest: Effort normal and breath sounds without rales or wheezing.  Left index finger pulp area with 1/2 cm open wound/bite with 1-2 mm deep reddish coloration but not bleeding, but with mild diffuse swelling without fluctuance or drainage Neurological: Pt is alert. At baseline orientation, motor grossly intact Skin: Skin is warm. No rashes, other new lesions, no LE edema Psychiatric: Pt behavior is normal without agitation  No other exam findings    Assessment & Plan:

## 2017-03-25 NOTE — Patient Instructions (Signed)
Please take all new medication as prescribed - the clindamycin (sent to [facility] but also in hardcopy in case you are not able to get there tonight)  Please continue all other medications as before, and refills have been done if requested.  Please have the pharmacy call with any other refills you may need.  Please keep your appointments with your specialists as you may have planned  Please go to the XRAY Department in the Basement (go straight as you get off the elevator) for the x-ray testing  You will be contacted by phone if any changes need to be made immediately.  Otherwise, you will receive a letter about your results with an explanation, but please check with MyChart first.  Please go to ED or call for [facility] referral if worsening

## 2017-04-01 ENCOUNTER — Encounter: Payer: Self-pay | Admitting: Allergy and Immunology

## 2017-04-01 ENCOUNTER — Ambulatory Visit (INDEPENDENT_AMBULATORY_CARE_PROVIDER_SITE_OTHER): Payer: 59 | Admitting: Allergy and Immunology

## 2017-04-01 VITALS — BP 138/76 | HR 98 | Temp 98.3°F | Resp 18 | Ht 62.5 in | Wt 212.0 lb

## 2017-04-01 DIAGNOSIS — B999 Unspecified infectious disease: Secondary | ICD-10-CM | POA: Diagnosis not present

## 2017-04-01 DIAGNOSIS — J310 Chronic rhinitis: Secondary | ICD-10-CM

## 2017-04-01 DIAGNOSIS — J4530 Mild persistent asthma, uncomplicated: Secondary | ICD-10-CM | POA: Diagnosis not present

## 2017-04-01 DIAGNOSIS — Z3492 Encounter for supervision of normal pregnancy, unspecified, second trimester: Secondary | ICD-10-CM | POA: Insufficient documentation

## 2017-04-01 MED ORDER — BUDESONIDE 180 MCG/ACT IN AEPB: 2.0000 | INHALATION_SPRAY | Freq: Two times a day (BID) | RESPIRATORY_TRACT | 3 refills | Status: DC

## 2017-04-01 MED ORDER — BUDESONIDE 32 MCG/ACT NA SUSP
NASAL | 3 refills | Status: DC
Start: 2017-04-01 — End: 2017-07-22

## 2017-04-01 MED ORDER — ALBUTEROL SULFATE HFA 108 (90 BASE) MCG/ACT IN AERS: 2.0000 | INHALATION_SPRAY | Freq: Four times a day (QID) | RESPIRATORY_TRACT | 1 refills | Status: DC | PRN

## 2017-04-01 MED FILL — VENTOLIN HFA 90 MCG INHALER: 108 (90 BAS | 25 days supply | Qty: 18 | Fill #0

## 2017-04-01 NOTE — Assessment & Plan Note (Addendum)
Spirometry revealed 190 mL postbronchodilator improvement while the patient was asymptomatic.  This suggests under perception of bronchoconstriction.   A prescription has been provided for Pulmicort Flexhaler 180 mcg. At the first signs of respiratory tract infections and asthma flares, take 2 inhalations 2 times per day until symptoms have returned to baseline.  Albuterol HFA, 1-2 inhalations every 4-6 hours if needed.  Subjective and objective measures of pulmonary function will be followed and the treatment plan will be adjusted accordingly.

## 2017-04-01 NOTE — Progress Notes (Signed)
New Patient Note  RE: Cynthia Townsend MRN: [id number] DOB: 05/11/1981 Date of Office Visit: 04/01/2017  Referring provider: Christia ReadingBates, Dwight, MD Primary care provider: Corwin LevinsJohn, James W, MD  Chief Complaint: Nasal Congestion; Recurrent Pneumonia; and Sinus Problem   History of present illness: Cynthia Townsend is a 36 y.o. female, [redacted] weeks pregnant, seen today in consultation requested by Christia Readingwight Bates, MD.   She reports that since October 2017 she has experienced 3 sinus infections requiring antibiotics or steroids, bronchitis 4 times, pneumonia 3 times, one episode of pneumonia was clinically diagnosed and the other 2 were confirmed with imaging, and strep throat twice.  She denies skin and urinary tract infections.  In August 2018, she had blood work drawn, including CBC and immunoglobulin levels.  The white cell count, differential, and immunoglobulin levels were within normal limits. Evaluation by her otolaryngologist, Dr. Christia Readingwight Bates, revealed hypertrophy of the turbinates but no other anatomic abnormalities accounting for recurrent sinusitis. She experiences persistent nasal congestion, thick postnasal drainage, "constantly coughing up stuff", and sinus pressure over the forehead and cheekbones.  No significant seasonal symptom variation has been noted nor have specific environmental triggers been identified.  In addition, she experiences rhinorrhea, sneezing, nasal pruritus, and ocular pruritus.  These symptoms are triggered by exposure to dogs.  She is also concerned that her nasal/ocular/sinus symptoms may be triggered by mold from extensive water damage in her home.  She has been evaluated by pulmonologists, Dr. Isaiah SergeMannam and Dr. Sherene SiresWert. She has a prescription for Symbicort 80-4.5 g which she only uses when she has bronchitis.   Assessment and plan: Recurrent infections Immunocompetence will be assessed with labs.  CBC and quantitative immunoglobulin levels were checked within the past 2 months so  will not be repeated today.  The white cell count, differential, and immunoglobulin levels were within normal limits.  The following labs have been ordered: Tetanus IgG titers, and pneumococcal IgG titers.  If pre-vaccination titers are low, post-vaccination titers will be drawn to assess response.    The patient will be called with further recommendations and follow-up instructions once the labs have returned.  Mild persistent asthma Spirometry revealed 190 mL postbronchodilator improvement while the patient was asymptomatic.  This suggests under perception of bronchoconstriction.   A prescription has been provided for Pulmicort Flexhaler 180 mcg. At the first signs of respiratory tract infections and asthma flares, take 2 inhalations 2 times per day until symptoms have returned to baseline.  Albuterol HFA, 1-2 inhalations every 4-6 hours if needed.  Subjective and objective measures of pulmonary function will be followed and the treatment plan will be adjusted accordingly.  Chronic rhinitis  We were unable to skin test today due to pregnancy.  A lab order form has been provided for serum specific IgE against environmental panel.  The patient will be called after lab results have returned with further recommendations, particularly regarding allergen avoidance measures.  A prescription has been provided for budesonide (Rhinocort AQ), 2 sprays per nostril 1-2 times daily as needed.  Nasal saline lavage (NeilMed) has been recommended as needed and prior to medicated nasal sprays along with instructions for proper administration.   Meds ordered this encounter  Medications  . budesonide (PULMICORT FLEXHALER) 180 MCG/ACT inhaler    Sig: Inhale 2 puffs into the lungs 2 (two) times daily.    Dispense:  1 each    Refill:  3  . albuterol (PROVENTIL HFA;VENTOLIN HFA) 108 (90 Base) MCG/ACT inhaler    Sig: Inhale 2  puffs into the lungs every 6 (six) hours as needed for wheezing or shortness of  breath.    Dispense:  1 Inhaler    Refill:  1  . budesonide (RHINOCORT AQUA) 32 MCG/ACT nasal spray    Sig: 2 sprays in each nostril 1-2 times daily    Dispense:  5 mL    Refill:  3    Diagnostics: Spirometry: FVC 3.10 L 81 was 2.57 L (79% predicted) with 190 mL postbronchodilator improvement.  This study was performed while the patient was asymptomatic.  Please see scanned spirometry results for details.    Physical examination: Blood pressure 138/76, pulse 98, temperature 98.3 F (36.8 C), temperature source Oral, resp. rate 18, height 5' 2.5" (1.588 m), weight 212 lb (96.2 kg), last menstrual period 10/30/2016, SpO2 99 %, currently breastfeeding.  General: Alert, interactive, in no acute distress. HEENT: TMs pearly gray, turbinates moderately edematous with thick discharge, post-pharynx moderately erythematous. Neck: Supple without lymphadenopathy. Lungs: Clear to auscultation without wheezing, rhonchi or rales. CV: Normal S1, S2 without murmurs. Abdomen: Gravid abdomen. Skin: Warm and dry, without lesions or rashes. Extremities:  No clubbing, cyanosis or edema. Neuro:   Grossly intact.  Review of systems:  Review of systems negative except as noted in HPI / PMHx or noted below: Review of Systems  Constitutional: Negative.   HENT: Negative.   Eyes: Negative.   Respiratory: Negative.   Cardiovascular: Negative.   Gastrointestinal: Negative.   Genitourinary: Negative.   Musculoskeletal: Negative.   Skin: Negative.   Neurological: Negative.   Endo/Heme/Allergies: Negative.   Psychiatric/Behavioral: Negative.     Past medical history:  Past Medical History:  Diagnosis Date  . ADD (attention deficit disorder)   . Anxiety   . Chicken pox   . Classic migraine with aura 09/20/2015  . Depression   . Ectopic pregnancy   . Insomnia   . Nasal polyps   . Pregnancy induced hypertension   . Recurrent upper respiratory infection (URI)   . Sciatic pain   . Subclinical  hypothyroidism   . Urticaria    dogs  . Vitamin D deficiency     Past surgical history:  Past Surgical History:  Procedure Laterality Date  . BREAST BIOPSY     benign  punch bx  . DILATION AND EVACUATION N/A 09/04/2015   Procedure: DILATATION AND EVACUATION;  Surgeon: Mitchel Honour, DO;  Location: [facility];  Service: Gynecology;  Laterality: N/A;  . DNC      Family history: Family History  Problem Relation Age of Onset  . Alcohol abuse Mother   . Lung cancer Mother   . Hyperlipidemia Mother   . Heart disease Mother   . Mental illness Mother   . Hyperthyroidism Mother   . COPD Mother   . Hyperlipidemia Father   . Heart disease Father   . Transient ischemic attack Father   . Pulmonary embolism Father   . Heart disease Sister   . Mental illness Sister   . Heart attack Sister   . Diabetes Sister   . Rheum arthritis Maternal Grandmother   . Heart disease Maternal Uncle     Social history: Social History   Social History  . Marital status: Single    Spouse name: N/A  . Number of children: 0  . Years of education: 55   Occupational History  . 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 Smoking status: Former Smoker  Types: Cigarettes  . Smokeless tobacco: Never Used  . Alcohol use No  . Drug use: No  . Sexual activity: Yes    Birth control/ protection: None   Other Topics Concern  . Not on file   Social History Narrative   Lives at home w/ her fiance and new daughter.   Right-handed.   Drinks about 1-2 cups of caffeine per day.   Fun: Hiking, Zumba, sleep   Denies religious beliefs effecting health care.   Denies abuse and feels safe at home.    Environmental History: The patient lives in 37 year old house with hardwood floors throughout and central air/heat.  There is water damage in the home.  There are 2 dogs in the home which have access to her bedroom.  She is a non-smoker.  Allergies as of 04/01/2017      Reactions   Penicillins  Shortness Of Breath, Swelling, Other (See Comments), Rash   Other reaction(s): Other (See Comments) Has patient had a PCN reaction causing immediate rash, facial/tongue/throat swelling, SOB or lightheadedness with hypotension:Unknown Has patient had a PCN reaction causing severe rash involving mucus membranes or skin necrosis: unknown Has patient had a PCN reaction that required hospitalization unknown Has patient had a PCN reaction occurring within the last 10 years: uknown If all of the above answers are "NO", then may proceed with Cephalosporin use. Has patient had a PCN reaction causing immediate rash, facial/tongue/throat swelling, SOB or lightheadedness with hypotension:Unknown Has patient had a PCN reaction causing severe rash involving mucus membranes or skin necrosis: unknown Has patient had a PCN reaction that required hospitalization unknown Has patient had a PCN reaction occurring within the last 10 years: uknown If all of the above answers are "NO", then may proceed with Cephalosporin use.   Sulfa Antibiotics Hives, Rash   Sulfamethoxazole Rash      Medication List       Accurate as of 04/01/17  1:35 PM. Always use your most recent med list.          acetaminophen 500 MG tablet Commonly known as:  TYLENOL Take 1,000 mg by mouth as needed.   acetaminophen-codeine 300-30 MG tablet Commonly known as:  TYLENOL #3 One every 4 hours as needed for cough   albuterol 108 (90 Base) MCG/ACT inhaler Commonly known as:  PROVENTIL HFA;VENTOLIN HFA Inhale 2 puffs into the lungs every 6 (six) hours as needed for wheezing or shortness of breath.   ALPRAZolam 0.5 MG tablet Commonly known as:  XANAX TAKE 1 TABLET BY MOUTH ONCE DAILY AS NEEDED FOR ANXIETY   amphetamine-dextroamphetamine 10 MG tablet Commonly known as:  ADDERALL Take 1 tablet (10 mg total) by mouth daily as needed.   amphetamine-dextroamphetamine 30 MG tablet Commonly known as:  ADDERALL Take 1 tablet by mouth  daily.   aspirin 81 MG chewable tablet Chew 81 mg by mouth daily.   aspirin 81 MG chewable tablet Chew by mouth.   budesonide 180 MCG/ACT inhaler Commonly known as:  PULMICORT FLEXHALER Inhale 2 puffs into the lungs 2 (two) times daily.   budesonide 32 MCG/ACT nasal spray Commonly known as:  RHINOCORT AQUA 2 sprays in each nostril 1-2 times daily   budesonide-formoterol 80-4.5 MCG/ACT inhaler Commonly known as:  SYMBICORT Inhale 2 puffs into the lungs 2 (two) times daily.   butalbital-acetaminophen-caffeine 50-325-40 MG tablet Commonly known as:  FIORICET, ESGIC Take 1 tablet by mouth every 4 (four) hours as needed for headache.   clindamycin 300 MG capsule  Commonly known as:  CLEOCIN Take 1 capsule (300 mg total) by mouth 3 (three) times daily.   dextromethorphan-guaiFENesin 30-600 MG 12hr tablet Commonly known as:  MUCINEX DM Take 1 tablet by mouth as needed for cough.   traZODone 100 MG tablet Commonly known as:  DESYREL Take 3 tablets (300 mg total) by mouth at bedtime.       Known medication allergies: Allergies  Allergen Reactions  . Penicillins Shortness Of Breath, Swelling, Other (See Comments) and Rash    Other reaction(s): Other (See Comments) Has patient had a PCN reaction causing immediate rash, facial/tongue/throat swelling, SOB or lightheadedness with hypotension:Unknown Has patient had a PCN reaction causing severe rash involving mucus membranes or skin necrosis: unknown Has patient had a PCN reaction that required hospitalization unknown Has patient had a PCN reaction occurring within the last 10 years: uknown If all of the above answers are "NO", then may proceed with Cephalosporin use. Has patient had a PCN reaction causing immediate rash, facial/tongue/throat swelling, SOB or lightheadedness with hypotension:Unknown Has patient had a PCN reaction causing severe rash involving mucus membranes or skin necrosis: unknown Has patient had a PCN reaction  that required hospitalization unknown Has patient had a PCN reaction occurring within the last 10 years: uknown If all of the above answers are "NO", then may proceed with Cephalosporin use.     . Sulfa Antibiotics Hives and Rash  . Sulfamethoxazole Rash    I appreciate the opportunity to take part in [place] care. Please do not hesitate to contact me with questions.  Sincerely,   R. Jorene Guest, MD

## 2017-04-01 NOTE — Assessment & Plan Note (Signed)
   We were unable to skin test today due to pregnancy.  A lab order form has been provided for serum specific IgE against environmental panel.  The patient will be called after lab results have returned with further recommendations, particularly regarding allergen avoidance measures.  A prescription has been provided for budesonide (Rhinocort AQ), 2 sprays per nostril 1-2 times daily as needed.  Nasal saline lavage (NeilMed) has been recommended as needed and prior to medicated nasal sprays along with instructions for proper administration.

## 2017-04-01 NOTE — Assessment & Plan Note (Addendum)
Immunocompetence will be assessed with labs.  CBC and quantitative immunoglobulin levels were checked within the past 2 months so will not be repeated today.  The white cell count, differential, and immunoglobulin levels were within normal limits.  The following labs have been ordered: Tetanus IgG titers, and pneumococcal IgG titers.  If pre-vaccination titers are low, post-vaccination titers will be drawn to assess response.    The patient will be called with further recommendations and follow-up instructions once the labs have returned.

## 2017-04-01 NOTE — Patient Instructions (Addendum)
Recurrent infections Immunocompetence will be assessed with labs.  CBC and quantitative immunoglobulin levels were checked within the past 2 months so will not be repeated today.  The white cell count, differential, and immunoglobulin levels were within normal limits.  The following labs have been ordered: Tetanus IgG titers, and pneumococcal IgG titers.  If pre-vaccination titers are low, post-vaccination titers will be drawn to assess response.    The patient will be called with further recommendations and follow-up instructions once the labs have returned.  Mild persistent asthma Spirometry revealed 190 mL postbronchodilator improvement while the patient was asymptomatic.  This suggests under perception of bronchoconstriction.   A prescription has been provided for Pulmicort Flexhaler 180 mcg. At the first signs of respiratory tract infections and asthma flares, take 2 inhalations 2 times per day until symptoms have returned to baseline.  Albuterol HFA, 1-2 inhalations every 4-6 hours if needed.  Subjective and objective measures of pulmonary function will be followed and the treatment plan will be adjusted accordingly.  Chronic rhinitis  We were unable to skin test today due to pregnancy.  A lab order form has been provided for serum specific IgE against environmental panel.  The patient will be called after lab results have returned with further recommendations, particularly regarding allergen avoidance measures.  A prescription has been provided for budesonide (Rhinocort AQ), 2 sprays per nostril 1-2 times daily as needed.  Nasal saline lavage (NeilMed) has been recommended as needed and prior to medicated nasal sprays along with instructions for proper administration.   When lab results have returned the patient will be called with further recommendations and follow up instructions. Follow-up in 4 weeks or sooner if needed.

## 2017-04-02 ENCOUNTER — Ambulatory Visit: Payer: 59 | Admitting: Cardiology

## 2017-04-03 DIAGNOSIS — S61311A Laceration without foreign body of left index finger with damage to nail, initial encounter: Secondary | ICD-10-CM | POA: Diagnosis not present

## 2017-04-05 LAB — STREP PNEUMONIAE 14 SEROTYPES IGG
Pneumo Ab Type 1*: <0.1 ug/mL — ABNORMAL LOW (ref >1.3)
Pneumo Ab Type 12 (12F)*: <0.1 ug/mL — ABNORMAL LOW (ref >1.3)
Pneumo Ab Type 14*: 1.6 ug/mL (ref >1.3)
Pneumo Ab Type 19 (19F)*: 0.8 ug/mL — ABNORMAL LOW (ref >1.3)
Pneumo Ab Type 23 (23F)*: <0.1 ug/mL — ABNORMAL LOW (ref >1.3)
Pneumo Ab Type 26 (6B)*: <0.1 ug/mL — ABNORMAL LOW (ref >1.3)
Pneumo Ab Type 3*: 1.3 ug/mL — ABNORMAL LOW (ref >1.3)
Pneumo Ab Type 4*: <0.1 ug/mL — ABNORMAL LOW (ref >1.3)
Pneumo Ab Type 51 (7F)*: 0.6 ug/mL — ABNORMAL LOW (ref >1.3)
Pneumo Ab Type 56 (18C)*: <0.1 ug/mL — ABNORMAL LOW (ref >1.3)
Pneumo Ab Type 57 (19A)*: 1.4 ug/mL (ref >1.3)
Pneumo Ab Type 68 (9V)*: 2.2 ug/mL (ref >1.3)
Pneumo Ab Type 8*: 0.1 ug/mL — ABNORMAL LOW (ref >1.3)
Pneumo Ab Type 9 (9N)*: <0.1 ug/mL — ABNORMAL LOW (ref >1.3)

## 2017-04-07 NOTE — H&P (Signed)
Cynthia Townsend is an 36 y.o. female.   Chief Complaint: LEFT INDEX FINGER LACERATION WITH NERVE INVOLVEMENT  HPI: Mrs. Remmert is a 36 y/o female who sustained a laceration to the left index finger on 03/22/17 when she attempted to pull a rawhide out of a dog's mouth since it was choking on it. She was seen in the emergency department where the wound was cleaned, dressed, and antibiotics were given.  She was seen in our office for further evaluation. She notes numbness through the finger and a shooting pain with certain movements. Discussed the reason and rationale for surgical repair of the wound and the nerve.  Discussed the surgical procedure, including the risks versus benefits, and the post-operative recovery. The patient is here today for surgery.   Past Medical History:  Diagnosis Date  . ADD (attention deficit disorder)   . Anxiety   . Chicken pox   . Classic migraine with aura 09/20/2015  . Depression   . Ectopic pregnancy   . Insomnia   . Nasal polyps   . Pregnancy induced hypertension   . Recurrent upper respiratory infection (URI)   . Sciatic pain   . Subclinical hypothyroidism   . Urticaria    dogs  . Vitamin D deficiency     Past Surgical History:  Procedure Laterality Date  . BREAST BIOPSY     benign  punch bx  . DILATION AND EVACUATION N/A 09/04/2015   Procedure: DILATATION AND EVACUATION;  Surgeon: Mitchel Honour, DO;  Location: [facility];  Service: Gynecology;  Laterality: N/A;  . DNC      Family History  Problem Relation Age of Onset  . Alcohol abuse Mother   . Lung cancer Mother   . Hyperlipidemia Mother   . Heart disease Mother   . Mental illness Mother   . Hyperthyroidism Mother   . COPD Mother   . Hyperlipidemia Father   . Heart disease Father   . Transient ischemic attack Father   . Pulmonary embolism Father   . Heart disease Sister   . Mental illness Sister   . Heart attack Sister   . Diabetes Sister   . Rheum arthritis Maternal Grandmother    . Heart disease Maternal Uncle    Social History:  reports that she has quit smoking. Her smoking use included Cigarettes. She has never used smokeless tobacco. She reports that she does not drink alcohol or use drugs.  Allergies:  Allergies  Allergen Reactions  . Penicillins Shortness Of Breath, Swelling, Other (See Comments) and Rash    Other reaction(s): Other (See Comments) Has patient had a PCN reaction causing immediate rash, facial/tongue/throat swelling, SOB or lightheadedness with hypotension:Unknown Has patient had a PCN reaction causing severe rash involving mucus membranes or skin necrosis: unknown Has patient had a PCN reaction that required hospitalization unknown Has patient had a PCN reaction occurring within the last 10 years: uknown If all of the above answers are "NO", then may proceed with Cephalosporin use. Has patient had a PCN reaction causing immediate rash, facial/tongue/throat swelling, SOB or lightheadedness with hypotension:Unknown Has patient had a PCN reaction causing severe rash involving mucus membranes or skin necrosis: unknown Has patient had a PCN reaction that required hospitalization unknown Has patient had a PCN reaction occurring within the last 10 years: uknown If all of the above answers are "NO", then may proceed with Cephalosporin use.     . Sulfa Antibiotics Hives and Rash  . Sulfamethoxazole Rash    No  prescriptions prior to admission.    No results found for this or any previous visit (from the past 48 hour(s)). No results found.  ROS NO RECENT ILLNESSES OR HOSPITALIZATIONS  Last menstrual period 10/30/2016, currently breastfeeding. Physical Exam  General Appearance:  Alert, cooperative, no distress, appears stated age  Head:  Normocephalic, without obvious abnormality, atraumatic  Eyes:  Pupils equal, conjunctiva/corneas clear,         Throat: Lips, mucosa, and tongue normal; teeth and gums normal  Neck: No visible masses      Lungs:   respirations unlabored  Chest Wall:  No tenderness or deformity  Heart:  Regular rate and rhythm,  Abdomen:   Soft, non-tender,         Extremities: LUE: SKIN INTACT, FINGER TIP WARM WELL PERFUSED ABLE TO FLEX DIP AND PIP JOINT OF INDEX GOOD MOBILITY TO LONG/RING/SMALL  Pulses: 2+ and symmetric  Skin: Skin color, texture, turgor normal, no rashes or lesions     Neurologic: Normal    Assessment LEFT INDEX FINGER LACERATION WITH TENDON INVOLVEMENT  Plan LEFT INDEX FINGER WOUND AND NERVE REPAIR  R/B/A DISCUSSED WITH PT IN OFFICE.  PT VOICED UNDERSTANDING OF PLAN CONSENT SIGNED DAY OF SURGERY PT SEEN AND EXAMINED PRIOR TO OPERATIVE PROCEDURE/DAY OF SURGERY SITE MARKED. QUESTIONS ANSWERED WILL GO HOME FOLLOWING SURGERY  WE ARE PLANNING SURGERY FOR YOUR UPPER EXTREMITY. THE RISKS AND BENEFITS OF SURGERY INCLUDE BUT NOT LIMITED TO BLEEDING INFECTION, DAMAGE TO NEARBY NERVES ARTERIES TENDONS, FAILURE OF SURGERY TO ACCOMPLISH ITS INTENDED GOALS, PERSISTENT SYMPTOMS AND NEED FOR FURTHER SURGICAL INTERVENTION. WITH THIS IN MIND WE WILL PROCEED. I HAVE DISCUSSED WITH THE PATIENT THE PRE AND POSTOPERATIVE REGIMEN AND THE DOS AND DON'TS. PT VOICED UNDERSTANDING AND INFORMED CONSENT SIGNED.   Karma GreaserSamantha Bonham Barton 04/07/2017, 3:11 PM

## 2017-04-09 ENCOUNTER — Encounter (HOSPITAL_COMMUNITY): Payer: Self-pay | Admitting: *Deleted

## 2017-04-09 NOTE — Progress Notes (Signed)
Pt is currently pregnant. Pt denies any acute cardiopulmonary issues.Pt advised to bring in al  Inhalers. Pt under the care of Dr. Nelson, Cardiology. Pt denies having a stress teDelton Seest and cardiac cath. Pt made aware to stop taking vitamins, fish oil and herbal medications. Do not take any NSAIDs ie: Ibuprofen, Advil, Naproxen (Aleve), Motrin,  BC and Goody Powder. Dr. Michelle Piperssey, Anesthesia advised that pat can eat a light breakfast (such as oatmeal and toast and no fried foods such as bacon, eggs and etc.) as long as it is completed by 7:00AM and she can have clear liquids up until 10:00am. Left voice message with Jacki ConesLaurie, Surgical Coordinator, to make Dr. Melvyn Novasrtmann aware to not move case up (per Dr. Michelle Piperssey, Anesthesia). Anesthesia made aware of pt history.

## 2017-04-09 NOTE — Progress Notes (Signed)
Anesthesia Chart Review:  Pt is a same day work up.   Pt is a 36 year old female scheduled for L index finger wound repair and nerve repair on 04/10/2017 with Bradly BienenstockFred Ortmann, MD  Case is posted for local anesthesia  Pt will be [id number] pregnant on the day of surgery.   - PCP is Oliver BarreJames John, MD - Pulmonologist is Chilton GreathousePraveen Mannam, MD who follows pt for cough. Last office visit 02/12/17 with Kandice RobinsonsSarah Groce, NP  - Cardiologist is Tobias AlexanderKatarina Nelson, MD, who is following pt through this pregnancy due to pt experiencing pulmonary edema postpartum with previous pregnancy 2017. Last office visit 03/13/17  - OB is Dr. Mitchel HonourMegan Morris who has cleared pt for local anesthesia.   PMH includes:  Postpartum 08/2015, pt had pre-eclampsia, pulmonary edema, PIH, anemia.retained POC requiring D&C;  ADD, subclinical hypothyroidism, asthma,   Medications include: albuterol, ASA 81mg , pulmicort  Labs will be obtained DOS  CXR 01/27/17: No acute cardiopulmonary abnormality.  EKG 03/13/17: NSR  Echo 09/04/16:  - Left ventricle: The cavity size was normal. Systolic function was normal. The estimated ejection fraction was in the range of 60% to 65%. Wall motion was normal; there were no regional wall motion abnormalities. Left ventricular diastolic function parameters were normal. - Aortic valve: Trileaflet; normal thickness leaflets. There was no regurgitation. - Left atrium: The atrium was normal in size. - Right ventricle: Systolic function was normal. - Right atrium: The atrium was normal in size. - Tricuspid valve: Structurally normal valve. - Pulmonary arteries: The main pulmonary artery was normal-sized. - Pericardium, extracardiac: There was no pericardial effusion.  I notified OB rapid response RN of date and time of pt's procedure.  Someone will plan to get FHTs in holding at around 3pm.    I notified Dr. Mikle Boswortharlos with neonatology of date and time and nature of pt's surgery.   If labs acceptable day of surgery, I  anticipate pt can proceed as scheduled.   Rica Mastngela Delaina Fetsch, FNP-BC [facility]Anesthesiology Phone: [phone] 04/09/2017 4:28 PM

## 2017-04-10 ENCOUNTER — Ambulatory Visit (HOSPITAL_COMMUNITY)
Admission: RE | Admit: 2017-04-10 | Discharge: 2017-04-10 | Disposition: A | Discharge status: Home / Self Care | Payer: 59 | Source: Ambulatory Visit | Attending: Orthopedic Surgery | Admitting: Orthopedic Surgery

## 2017-04-10 ENCOUNTER — Ambulatory Visit (HOSPITAL_COMMUNITY): Payer: 59 | Admitting: Emergency Medicine

## 2017-04-10 ENCOUNTER — Encounter (HOSPITAL_COMMUNITY): Payer: Self-pay | Admitting: Orthopedic Surgery

## 2017-04-10 ENCOUNTER — Encounter (HOSPITAL_COMMUNITY)
Admission: RE | Disposition: A | Discharge status: Home / Self Care | Payer: Self-pay | Source: Ambulatory Visit | Attending: Orthopedic Surgery

## 2017-04-10 DIAGNOSIS — Z8249 Family history of ischemic heart disease and other diseases of the circulatory system: Secondary | ICD-10-CM | POA: Insufficient documentation

## 2017-04-10 DIAGNOSIS — Z9889 Other specified postprocedural states: Secondary | ICD-10-CM | POA: Diagnosis not present

## 2017-04-10 DIAGNOSIS — F988 Other specified behavioral and emotional disorders with onset usually occurring in childhood and adolescence: Secondary | ICD-10-CM | POA: Diagnosis not present

## 2017-04-10 DIAGNOSIS — Z882 Allergy status to sulfonamides status: Secondary | ICD-10-CM | POA: Insufficient documentation

## 2017-04-10 DIAGNOSIS — J45909 Unspecified asthma, uncomplicated: Secondary | ICD-10-CM | POA: Diagnosis not present

## 2017-04-10 DIAGNOSIS — S64491A Injury of digital nerve of left index finger, initial encounter: Secondary | ICD-10-CM | POA: Insufficient documentation

## 2017-04-10 DIAGNOSIS — F419 Anxiety disorder, unspecified: Secondary | ICD-10-CM | POA: Diagnosis not present

## 2017-04-10 DIAGNOSIS — E039 Hypothyroidism, unspecified: Secondary | ICD-10-CM | POA: Diagnosis not present

## 2017-04-10 DIAGNOSIS — Z818 Family history of other mental and behavioral disorders: Secondary | ICD-10-CM | POA: Diagnosis not present

## 2017-04-10 DIAGNOSIS — Z880 Allergy status to penicillin: Secondary | ICD-10-CM | POA: Diagnosis not present

## 2017-04-10 DIAGNOSIS — Z87891 Personal history of nicotine dependence: Secondary | ICD-10-CM | POA: Insufficient documentation

## 2017-04-10 DIAGNOSIS — O99012 Anemia complicating pregnancy, second trimester: Secondary | ICD-10-CM | POA: Insufficient documentation

## 2017-04-10 DIAGNOSIS — G4700 Insomnia, unspecified: Secondary | ICD-10-CM | POA: Diagnosis not present

## 2017-04-10 DIAGNOSIS — O26892 Other specified pregnancy related conditions, second trimester: Secondary | ICD-10-CM | POA: Insufficient documentation

## 2017-04-10 DIAGNOSIS — Z7951 Long term (current) use of inhaled steroids: Secondary | ICD-10-CM | POA: Insufficient documentation

## 2017-04-10 DIAGNOSIS — Z8349 Family history of other endocrine, nutritional and metabolic diseases: Secondary | ICD-10-CM | POA: Diagnosis not present

## 2017-04-10 DIAGNOSIS — M79645 Pain in left finger(s): Secondary | ICD-10-CM | POA: Diagnosis not present

## 2017-04-10 DIAGNOSIS — W540XXA Bitten by dog, initial encounter: Secondary | ICD-10-CM | POA: Insufficient documentation

## 2017-04-10 DIAGNOSIS — Z8261 Family history of arthritis: Secondary | ICD-10-CM | POA: Insufficient documentation

## 2017-04-10 DIAGNOSIS — Z836 Family history of other diseases of the respiratory system: Secondary | ICD-10-CM | POA: Insufficient documentation

## 2017-04-10 DIAGNOSIS — Z3A24 24 weeks gestation of pregnancy: Secondary | ICD-10-CM | POA: Diagnosis not present

## 2017-04-10 DIAGNOSIS — Z811 Family history of alcohol abuse and dependence: Secondary | ICD-10-CM | POA: Diagnosis not present

## 2017-04-10 DIAGNOSIS — L03012 Cellulitis of left finger: Secondary | ICD-10-CM

## 2017-04-10 DIAGNOSIS — S61251A Open bite of left index finger without damage to nail, initial encounter: Secondary | ICD-10-CM | POA: Insufficient documentation

## 2017-04-10 DIAGNOSIS — Z823 Family history of stroke: Secondary | ICD-10-CM | POA: Diagnosis not present

## 2017-04-10 DIAGNOSIS — Z833 Family history of diabetes mellitus: Secondary | ICD-10-CM | POA: Insufficient documentation

## 2017-04-10 DIAGNOSIS — O99342 Other mental disorders complicating pregnancy, second trimester: Secondary | ICD-10-CM | POA: Diagnosis not present

## 2017-04-10 DIAGNOSIS — S61211A Laceration without foreign body of left index finger without damage to nail, initial encounter: Secondary | ICD-10-CM | POA: Diagnosis not present

## 2017-04-10 DIAGNOSIS — J4530 Mild persistent asthma, uncomplicated: Secondary | ICD-10-CM | POA: Diagnosis not present

## 2017-04-10 DIAGNOSIS — O99282 Endocrine, nutritional and metabolic diseases complicating pregnancy, second trimester: Secondary | ICD-10-CM | POA: Diagnosis not present

## 2017-04-10 HISTORY — DX: Anemia, unspecified: D64.9

## 2017-04-10 HISTORY — DX: Unspecified asthma, uncomplicated: J45.909

## 2017-04-10 HISTORY — DX: Pneumonia, unspecified organism: J18.9

## 2017-04-10 HISTORY — DX: Laceration without foreign body of unspecified finger without damage to nail, initial encounter: S61.219A

## 2017-04-10 HISTORY — DX: Unspecified pre-eclampsia, complicating the puerperium: O14.95

## 2017-04-10 HISTORY — DX: Chronic pulmonary edema: J81.1

## 2017-04-10 HISTORY — PX: I & D EXTREMITY: SHX5045

## 2017-04-10 LAB — BASIC METABOLIC PANEL
ANION GAP: 10 (ref 5–15)
BUN: <5 mg/dL — ABNORMAL LOW (ref 6–20)
CALCIUM: 8.7 mg/dL — AB (ref 8.9–10.3)
CO2: 18 mmol/L — AB (ref 22–32)
Chloride: 106 mmol/L (ref 101–111)
Creatinine, Ser: 0.33 mg/dL — ABNORMAL LOW (ref 0.44–1.00)
GFR calc Af Amer: >60 mL/min (ref >60)
GFR calc non Af Amer: >60 mL/min (ref >60)
GLUCOSE: 80 mg/dL (ref 65–99)
Potassium: 3.4 mmol/L — ABNORMAL LOW (ref 3.5–5.1)
Sodium: 134 mmol/L — ABNORMAL LOW (ref 135–145)

## 2017-04-10 LAB — CBC
HEMATOCRIT: 35.2 % — AB (ref 36.0–46.0)
Hemoglobin: 11.8 g/dL — ABNORMAL LOW (ref 12.0–15.0)
MCH: 29.3 pg (ref 26.0–34.0)
MCHC: 33.5 g/dL (ref 30.0–36.0)
MCV: 87.3 fL (ref 78.0–100.0)
Platelets: 233 10*3/uL (ref 150–400)
RBC: 4.03 MIL/uL (ref 3.87–5.11)
RDW: 14.4 % (ref 11.5–15.5)
WBC: 9.4 10*3/uL (ref 4.0–10.5)

## 2017-04-10 SURGERY — IRRIGATION AND DEBRIDEMENT EXTREMITY
Anesthesia: Monitor Anesthesia Care | Laterality: Left

## 2017-04-10 MED ORDER — LIDOCAINE HCL (PF) 1 % IJ SOLN
INTRAMUSCULAR | Status: DC | PRN
  Administered 2017-04-10: 4 mL

## 2017-04-10 MED ORDER — LIDOCAINE 2% (20 MG/ML) 5 ML SYRINGE
INTRAMUSCULAR | Status: AC
  Filled 2017-04-10: qty 5

## 2017-04-10 MED ORDER — PROPOFOL 10 MG/ML IV BOLUS
INTRAVENOUS | Status: AC
  Filled 2017-04-10: qty 20

## 2017-04-10 MED ORDER — CHLORHEXIDINE GLUCONATE 4 % EX LIQD: 60.0000 mL | Freq: Once | CUTANEOUS | Status: DC

## 2017-04-10 MED ORDER — CLINDAMYCIN PHOSPHATE 900 MG/50ML IV SOLN
INTRAVENOUS | Status: AC
  Filled 2017-04-10: qty 50

## 2017-04-10 MED ORDER — CLINDAMYCIN PHOSPHATE 900 MG/50ML IV SOLN
900.0000 mg | INTRAVENOUS | Status: AC
  Administered 2017-04-10: 900 mg via INTRAVENOUS

## 2017-04-10 MED ORDER — FENTANYL CITRATE (PF) 100 MCG/2ML IJ SOLN: 25.0000 ug | INTRAMUSCULAR | Status: DC | PRN

## 2017-04-10 MED ORDER — BUPIVACAINE HCL (PF) 0.25 % IJ SOLN
INTRAMUSCULAR | Status: DC | PRN
  Administered 2017-04-10: 4 mL

## 2017-04-10 MED ORDER — BUPIVACAINE HCL (PF) 0.25 % IJ SOLN
INTRAMUSCULAR | Status: AC
  Filled 2017-04-10: qty 30

## 2017-04-10 MED ORDER — LIDOCAINE HCL 1 % IJ SOLN
INTRAMUSCULAR | Status: AC
  Filled 2017-04-10: qty 20

## 2017-04-10 MED ORDER — SODIUM CHLORIDE 0.9 % IR SOLN
Status: DC | PRN
  Administered 2017-04-10: 1000 mL

## 2017-04-10 MED ORDER — FENTANYL CITRATE (PF) 250 MCG/5ML IJ SOLN
INTRAMUSCULAR | Status: AC
  Filled 2017-04-10: qty 5

## 2017-04-10 MED ORDER — MIDAZOLAM HCL 2 MG/2ML IJ SOLN
INTRAMUSCULAR | Status: AC
  Filled 2017-04-10: qty 2

## 2017-04-10 MED ORDER — LACTATED RINGERS IV SOLN
INTRAVENOUS | Status: DC
  Administered 2017-04-10: 17:00:00 via INTRAVENOUS

## 2017-04-10 SURGICAL SUPPLY — 56 items
BANDAGE ACE 3X5.8 VEL STRL LF (GAUZE/BANDAGES/DRESSINGS) IMPLANT
BANDAGE ACE 4X5 VEL STRL LF (GAUZE/BANDAGES/DRESSINGS) IMPLANT
BNDG COHESIVE 1X5 TAN STRL LF (GAUZE/BANDAGES/DRESSINGS) IMPLANT
BNDG CONFORM 2 STRL LF (GAUZE/BANDAGES/DRESSINGS) ×2 IMPLANT
BNDG ESMARK 4X9 LF (GAUZE/BANDAGES/DRESSINGS) ×2 IMPLANT
BNDG GAUZE ELAST 4 BULKY (GAUZE/BANDAGES/DRESSINGS) IMPLANT
CORDS BIPOLAR (ELECTRODE) ×2 IMPLANT
COVER SURGICAL LIGHT HANDLE (MISCELLANEOUS) ×2 IMPLANT
CUFF TOURNIQUET SINGLE 18IN (TOURNIQUET CUFF) IMPLANT
CUFF TOURNIQUET SINGLE 24IN (TOURNIQUET CUFF) IMPLANT
DRAIN PENROSE 1/4X12 LTX STRL (WOUND CARE) IMPLANT
DRAPE SURG 17X23 STRL (DRAPES) ×2 IMPLANT
DRSG ADAPTIC 3X8 NADH LF (GAUZE/BANDAGES/DRESSINGS) ×2 IMPLANT
ELECT REM PT RETURN 9FT ADLT (ELECTROSURGICAL)
ELECTRODE REM PT RTRN 9FT ADLT (ELECTROSURGICAL) IMPLANT
GAUZE SPONGE 2X2 8PLY NS (GAUZE/BANDAGES/DRESSINGS) ×2 IMPLANT
GAUZE SPONGE 4X4 12PLY STRL (GAUZE/BANDAGES/DRESSINGS) IMPLANT
GAUZE XEROFORM 1X8 LF (GAUZE/BANDAGES/DRESSINGS) IMPLANT
GAUZE XEROFORM 5X9 LF (GAUZE/BANDAGES/DRESSINGS) IMPLANT
GLOVE BIOGEL PI IND STRL 8.5 (GLOVE) ×1 IMPLANT
GLOVE BIOGEL PI INDICATOR 8.5 (GLOVE) ×1
GLOVE SURG ORTHO 8.0 STRL STRW (GLOVE) ×2 IMPLANT
GOWN STRL REUS W/ TWL LRG LVL3 (GOWN DISPOSABLE) ×2 IMPLANT
GOWN STRL REUS W/ TWL XL LVL3 (GOWN DISPOSABLE) ×1 IMPLANT
GOWN STRL REUS W/TWL LRG LVL3 (GOWN DISPOSABLE) ×2
GOWN STRL REUS W/TWL XL LVL3 (GOWN DISPOSABLE) ×1
HANDPIECE INTERPULSE COAX TIP (DISPOSABLE)
KIT BASIN OR (CUSTOM PROCEDURE TRAY) ×2 IMPLANT
KIT ROOM TURNOVER OR (KITS) ×2 IMPLANT
MANIFOLD NEPTUNE II (INSTRUMENTS) IMPLANT
NEEDLE HYPO 25GX1X1/2 BEV (NEEDLE) ×2 IMPLANT
NS IRRIG 1000ML POUR BTL (IV SOLUTION) ×2 IMPLANT
PACK ORTHO EXTREMITY (CUSTOM PROCEDURE TRAY) ×2 IMPLANT
PAD ARMBOARD 7.5X6 YLW CONV (MISCELLANEOUS) ×4 IMPLANT
PAD CAST 4YDX4 CTTN HI CHSV (CAST SUPPLIES) IMPLANT
PADDING CAST COTTON 4X4 STRL (CAST SUPPLIES)
SET CYSTO W/LG BORE CLAMP LF (SET/KITS/TRAYS/PACK) IMPLANT
SET HNDPC FAN SPRY TIP SCT (DISPOSABLE) IMPLANT
SOAP 2 % CHG 4 OZ (WOUND CARE) ×2 IMPLANT
SPONGE LAP 18X18 X RAY DECT (DISPOSABLE) ×2 IMPLANT
SPONGE LAP 4X18 X RAY DECT (DISPOSABLE) ×2 IMPLANT
SUCTION FRAZIER HANDLE 10FR (MISCELLANEOUS)
SUCTION TUBE FRAZIER 10FR DISP (MISCELLANEOUS) IMPLANT
SUT ETHILON 4 0 PS 2 18 (SUTURE) IMPLANT
SUT ETHILON 5 0 P 3 18 (SUTURE)
SUT NYLON ETHILON 5-0 P-3 1X18 (SUTURE) IMPLANT
SUT PROLENE 5 0 P 3 (SUTURE) ×2 IMPLANT
SWAB COLLECTION DEVICE MRSA (MISCELLANEOUS) IMPLANT
SWAB CULTURE ESWAB REG 1ML (MISCELLANEOUS) IMPLANT
SYR CONTROL 10ML LL (SYRINGE) ×2 IMPLANT
TOWEL OR 17X24 6PK STRL BLUE (TOWEL DISPOSABLE) ×2 IMPLANT
TOWEL OR 17X26 10 PK STRL BLUE (TOWEL DISPOSABLE) ×2 IMPLANT
TUBE CONNECTING 12X1/4 (SUCTIONS) ×2 IMPLANT
UNDERPAD 30X30 (UNDERPADS AND DIAPERS) ×2 IMPLANT
WATER STERILE IRR 1000ML POUR (IV SOLUTION) IMPLANT
YANKAUER SUCT BULB TIP NO VENT (SUCTIONS) ×2 IMPLANT

## 2017-04-10 NOTE — Anesthesia Procedure Notes (Addendum)
Procedure Name: MAC Performed by: Lance Coon Pre-anesthesia Checklist: Patient identified, Emergency Drugs available, Suction available, Patient being monitored and Timeout performed Patient Re-evaluated:Patient Re-evaluated prior to induction

## 2017-04-10 NOTE — Progress Notes (Signed)
Rapid response nurse, Denny PeonErin, notified that patient had arrived to surgical short stay.  Denny Peonrin stated that she plans to arrive to our department around 3:00pm or shortly after.

## 2017-04-10 NOTE — Transfer of Care (Signed)
Immediate Anesthesia Transfer of Care Note  Patient: Cynthia Townsend  Procedure(s) Performed: LEFT INDEX FINGER WOUND REPAIR AND NERVE REPAIR (Left )  Patient Location: PACU  Anesthesia Type:MAC  Level of Consciousness: awake, alert  and oriented  Airway & Oxygen Therapy: Patient Spontanous Breathing  Post-op Assessment: Report given to RN, Post -op Vital signs reviewed and stable and Patient moving all extremities X 4  Post vital signs: Reviewed and stable  Last Vitals:  Vitals:   04/10/17 1429 04/10/17 1739  BP: 134/75   Resp: 17   Temp: 36.7 C 36.9 C  SpO2: 100%     Last Pain:  Vitals:   04/10/17 1429  TempSrc: Oral         Complications: No apparent anesthesia complications

## 2017-04-10 NOTE — Progress Notes (Signed)
Post op NST/ FHR completed.  FHR reactive and reassuring as appropriate for GA.

## 2017-04-10 NOTE — Discharge Instructions (Signed)
KEEP BANDAGE CLEAN AND DRY °CALL OFFICE FOR F/U APPT [phone] IN 8 DAYS °KEEP HAND ELEVATED ABOVE HEART °OK TO APPLY ICE TO OPERATIVE AREA °CONTACT OFFICE IF ANY WORSENING PAIN OR CONCERNS. °

## 2017-04-10 NOTE — Op Note (Signed)
PREOPERATIVE DIAGNOSIS: Left index laceration with nerve involvement  POSTOPERATIVE DIAGNOSIS: Same  ATTENDING SURGEON: Dr. Gilman SchmidtFred Ortman who was scrubbed and present for the entire procedure  ASSISTANT SURGEON: None  ANESTHESIA: Local only 1% Xylocaine 5 mL 5 mL quarter percent Marcaine  OPERATIVE PROCEDURE: #1: Left index finger radial digital nerve neuroplasty 2.Left index finger traumatic laceration repair 1 1/2 cm  IMPLANTS:  None RADIOGRAPHIC INTERPRETATION: None  SURGICAL INDICATIONS: Patient is a right-hand-dominant female who sustained a dog bite to the index finger. Patient was seen and evaluated in the office and recommended undergo the above procedure. Risks benefits and alternatives were discussed in detail with the patient in a signed informed consent was obtained risk including but not limited to bleeding infection damage to nearby nerves arteries or tendons loss of motion of the wrists and digits incomplete relief of symptoms and need for further surgical intervention  SURGICAL TECHNIQUE: Patient is probably identified in the preoperative holding area and a mark with a permanent marker made on the left index finger to indicate correct operative site. Patient is then brought back to operating room placed supine on anesthesia and table where the local anesthetic had been administered. A well-padded tourniquet was placed on the left forearm and sealed with the appropriate drape. The patient did not receive any sedation. No intravenous medications were administered. The hand was then prepped and draped in normal sterile fashion. A timeout was called cracks I was identified and the procedure then begun. It oblique incision was then made directly over the previous laceration which was 1 1/2 cm. Dissection carried down through the skin and subcutaneous tissue. Careful dissection was then carried out to identify the radial digital nerve and its terminal endpoint and this was in continuity.  The patient did have some midline collection of what appear to be nerve-type receptor cells and these were then excised beneath the scar bed. The wound was then thoroughly irrigated. After digital neuroplasty and excision of the nerve receptor cells the wound was then thoroughly irrigated. After thorough wound irrigation the traumatic laceration was then closed with Prolene sutures. Tourniquet deflated good perfusion the finger. Adaptic dressing and a sterile compressive bandage then applied. Patient tolerated the procedure well.  POSTOPERATIVE PLAN: Discharge to home. Seen back in the office in 8 days for wound check suture removal and gradual use and activity.

## 2017-04-10 NOTE — Progress Notes (Signed)
Here to perform pre-op NST/ EFM for this 36 yo G3P1 @ [redacted] wks GA. Pt will undergo procedure to left index finger.  She denies vaginal bleeding, LOF, and UC's and reports good fetal movement. 1625 NST complete and FHR reactive, reassuring as appropriate for GA.

## 2017-04-10 NOTE — Anesthesia Preprocedure Evaluation (Signed)
Anesthesia Evaluation  Patient identified by MRN, date of birth, ID band Patient awake    Reviewed: Allergy & Precautions, H&P , NPO status , Patient's Chart, lab work & pertinent test results  Airway Mallampati: I  TM Distance: >3 FB Neck ROM: Full    Dental no notable dental hx. (+) Teeth Intact, Dental Advisory Given   Pulmonary asthma , former smoker,    Pulmonary exam normal breath sounds clear to auscultation       Cardiovascular hypertension,  Rhythm:Regular Rate:Normal     Neuro/Psych  Headaches, Anxiety Depression    GI/Hepatic negative GI ROS, Neg liver ROS,   Endo/Other  Hypothyroidism   Renal/GU negative Renal ROS  negative genitourinary   Musculoskeletal   Abdominal   Peds  Hematology negative hematology ROS (+)   Anesthesia Other Findings   Reproductive/Obstetrics negative OB ROS                             Anesthesia Physical Anesthesia Plan  ASA: II  Anesthesia Plan: MAC   Post-op Pain Management:    Induction: Intravenous  PONV Risk Score and Plan: 2 and Treatment may vary due to age or medical condition  Airway Management Planned: Nasal Cannula  Additional Equipment:   Intra-op Plan:   Post-operative Plan:   Informed Consent: I have reviewed the patients History and Physical, chart, labs and discussed the procedure including the risks, benefits and alternatives for the proposed anesthesia with the patient or authorized representative who has indicated his/her understanding and acceptance.   Dental advisory given  Plan Discussed with: CRNA  Anesthesia Plan Comments:         Anesthesia Quick Evaluation

## 2017-04-11 ENCOUNTER — Encounter (HOSPITAL_COMMUNITY): Payer: Self-pay | Admitting: Orthopedic Surgery

## 2017-04-11 NOTE — Anesthesia Postprocedure Evaluation (Signed)
Anesthesia Post Note  Patient: Cynthia Townsend  Procedure(s) Performed: LEFT INDEX FINGER WOUND REPAIR AND NERVE REPAIR (Left )     Patient location during evaluation: PACU Anesthesia Type: MAC Level of consciousness: awake and alert Pain management: pain level controlled Vital Signs Assessment: post-procedure vital signs reviewed and stable Respiratory status: spontaneous breathing, nonlabored ventilation and respiratory function stable Cardiovascular status: stable and blood pressure returned to baseline Postop Assessment: no apparent nausea or vomiting Anesthetic complications: no    Last Vitals:  Vitals:   04/10/17 1815 04/10/17 1828  BP:    Pulse: 87 85  Resp: 18 20  Temp:    SpO2: 98% 99%    Last Pain:  Vitals:   04/10/17 1815  TempSrc:   PainSc: 0-No pain                 Nico Syme,W. EDMOND

## 2017-04-18 DIAGNOSIS — M654 Radial styloid tenosynovitis [de Quervain]: Secondary | ICD-10-CM | POA: Diagnosis not present

## 2017-04-22 ENCOUNTER — Telehealth: Payer: Self-pay | Admitting: Internal Medicine

## 2017-04-22 NOTE — Telephone Encounter (Signed)
Patient requesting pneumococcal vac.  Please advise.  If ok.  Patient requesting to scheduled on Friday.

## 2017-04-22 NOTE — Telephone Encounter (Signed)
Ok for pneumovax

## 2017-04-23 NOTE — Telephone Encounter (Signed)
Called pt no answer LMOM MD ok pneumovax pls call back to make nurse visit for Friday...Raechel Chute/lmb

## 2017-04-25 ENCOUNTER — Ambulatory Visit: Payer: 59

## 2017-04-28 ENCOUNTER — Other Ambulatory Visit: Payer: 59

## 2017-04-28 ENCOUNTER — Other Ambulatory Visit: Payer: Self-pay | Admitting: Cardiology

## 2017-04-28 ENCOUNTER — Telehealth: Payer: Self-pay | Admitting: *Deleted

## 2017-04-28 DIAGNOSIS — D508 Other iron deficiency anemias: Secondary | ICD-10-CM | POA: Diagnosis not present

## 2017-04-28 DIAGNOSIS — R5383 Other fatigue: Secondary | ICD-10-CM | POA: Diagnosis not present

## 2017-04-28 NOTE — Telephone Encounter (Signed)
Verbal Order for CBC W DIFF, given from Dr Delton SeeNelson, on this pt.  CBC W DIFF ordered for pt to follow-up on complaints of feeling fatigued, and pt reports a history of low counts.  Order placed and pt will have lab done today.

## 2017-04-29 LAB — CBC WITH DIFFERENTIAL/PLATELET
Basophils Absolute: 0 10*3/uL (ref 0.0–0.2)
Basos: 0 %
EOS (ABSOLUTE): 0.2 10*3/uL (ref 0.0–0.4)
Eos: 2 %
Hematocrit: 32.2 % — ABNORMAL LOW (ref 34.0–46.6)
Hemoglobin: 11.1 g/dL (ref 11.1–15.9)
Immature Grans (Abs): 0 10*3/uL (ref 0.0–0.1)
Immature Granulocytes: 0 %
Lymphocytes Absolute: 1.7 10*3/uL (ref 0.7–3.1)
Lymphs: 14 %
MCH: 30.2 pg (ref 26.6–33.0)
MCHC: 34.5 g/dL (ref 31.5–35.7)
MCV: 88 fL (ref 79–97)
Monocytes Absolute: 0.8 10*3/uL (ref 0.1–0.9)
Monocytes: 7 %
Neutrophils Absolute: 9.3 10*3/uL — ABNORMAL HIGH (ref 1.4–7.0)
Neutrophils: 77 %
Platelets: 269 10*3/uL (ref 150–379)
RBC: 3.68 x10E6/uL — ABNORMAL LOW (ref 3.77–5.28)
RDW: 15.3 % (ref 12.3–15.4)
WBC: 12 10*3/uL — ABNORMAL HIGH (ref 3.4–10.8)

## 2017-04-29 MED FILL — AZITHROMYCIN 250 MG TABLET: 250 | 5 days supply | Qty: 6 | Fill #0

## 2017-05-07 DIAGNOSIS — Z3480 Encounter for supervision of other normal pregnancy, unspecified trimester: Secondary | ICD-10-CM | POA: Diagnosis not present

## 2017-05-09 MED FILL — METHYLPREDNISOLONE 4 MG TAB: 4 | 6 days supply | Qty: 21 | Fill #0

## 2017-05-09 MED FILL — CEFUROXIME AXETIL 250 MG TA: 250 | 10 days supply | Qty: 20 | Fill #0

## 2017-05-22 MED FILL — ZOLPIDEM TARTRATE 5 MG TAB: 5 | 30 days supply | Qty: 30 | Fill #0

## 2017-05-30 ENCOUNTER — Encounter (HOSPITAL_COMMUNITY): Payer: Self-pay | Admitting: *Deleted

## 2017-05-30 ENCOUNTER — Inpatient Hospital Stay (HOSPITAL_COMMUNITY)
Admission: AD | Admit: 2017-05-30 | Discharge: 2017-05-30 | Disposition: A | Discharge status: Home / Self Care | Payer: 59 | Source: Ambulatory Visit | Attending: Obstetrics and Gynecology | Admitting: Obstetrics and Gynecology

## 2017-05-30 DIAGNOSIS — O99283 Endocrine, nutritional and metabolic diseases complicating pregnancy, third trimester: Secondary | ICD-10-CM | POA: Diagnosis not present

## 2017-05-30 DIAGNOSIS — J45909 Unspecified asthma, uncomplicated: Secondary | ICD-10-CM | POA: Diagnosis not present

## 2017-05-30 DIAGNOSIS — O99343 Other mental disorders complicating pregnancy, third trimester: Secondary | ICD-10-CM | POA: Diagnosis not present

## 2017-05-30 DIAGNOSIS — R51 Headache: Secondary | ICD-10-CM | POA: Diagnosis not present

## 2017-05-30 DIAGNOSIS — O26893 Other specified pregnancy related conditions, third trimester: Secondary | ICD-10-CM | POA: Diagnosis not present

## 2017-05-30 DIAGNOSIS — O09523 Supervision of elderly multigravida, third trimester: Secondary | ICD-10-CM | POA: Insufficient documentation

## 2017-05-30 DIAGNOSIS — Z3A31 31 weeks gestation of pregnancy: Secondary | ICD-10-CM | POA: Diagnosis not present

## 2017-05-30 DIAGNOSIS — Z87891 Personal history of nicotine dependence: Secondary | ICD-10-CM | POA: Insufficient documentation

## 2017-05-30 DIAGNOSIS — E039 Hypothyroidism, unspecified: Secondary | ICD-10-CM | POA: Diagnosis not present

## 2017-05-30 DIAGNOSIS — O133 Gestational [pregnancy-induced] hypertension without significant proteinuria, third trimester: Secondary | ICD-10-CM | POA: Diagnosis not present

## 2017-05-30 DIAGNOSIS — O99513 Diseases of the respiratory system complicating pregnancy, third trimester: Secondary | ICD-10-CM | POA: Insufficient documentation

## 2017-05-30 DIAGNOSIS — Z79899 Other long term (current) drug therapy: Secondary | ICD-10-CM | POA: Insufficient documentation

## 2017-05-30 DIAGNOSIS — E559 Vitamin D deficiency, unspecified: Secondary | ICD-10-CM | POA: Insufficient documentation

## 2017-05-30 DIAGNOSIS — O163 Unspecified maternal hypertension, third trimester: Secondary | ICD-10-CM | POA: Diagnosis present

## 2017-05-30 DIAGNOSIS — O0913 Supervision of pregnancy with history of ectopic or molar pregnancy, third trimester: Secondary | ICD-10-CM | POA: Diagnosis not present

## 2017-05-30 DIAGNOSIS — F419 Anxiety disorder, unspecified: Secondary | ICD-10-CM | POA: Diagnosis not present

## 2017-05-30 LAB — CBC
HEMATOCRIT: 32.3 % — AB (ref 36.0–46.0)
HEMOGLOBIN: 10.9 g/dL — AB (ref 12.0–15.0)
MCH: 29.9 pg (ref 26.0–34.0)
MCHC: 33.7 g/dL (ref 30.0–36.0)
MCV: 88.7 fL (ref 78.0–100.0)
Platelets: 220 10*3/uL (ref 150–400)
RBC: 3.64 MIL/uL — ABNORMAL LOW (ref 3.87–5.11)
RDW: 14.5 % (ref 11.5–15.5)
WBC: 7.9 10*3/uL (ref 4.0–10.5)

## 2017-05-30 LAB — URINALYSIS, ROUTINE W REFLEX MICROSCOPIC
BILIRUBIN URINE: NEGATIVE
GLUCOSE, UA: NEGATIVE mg/dL
HGB URINE DIPSTICK: NEGATIVE
KETONES UR: 20 mg/dL — AB
LEUKOCYTES UA: NEGATIVE
NITRITE: NEGATIVE
PH: 6 (ref 5.0–8.0)
Protein, ur: 30 mg/dL — AB
SPECIFIC GRAVITY, URINE: 1.024 (ref 1.005–1.030)

## 2017-05-30 LAB — COMPREHENSIVE METABOLIC PANEL
ALBUMIN: 3 g/dL — AB (ref 3.5–5.0)
ALK PHOS: 116 U/L (ref 38–126)
ALT: 14 U/L (ref 14–54)
ANION GAP: 9 (ref 5–15)
AST: 17 U/L (ref 15–41)
BILIRUBIN TOTAL: 0.3 mg/dL (ref 0.3–1.2)
CO2: 20 mmol/L — AB (ref 22–32)
Calcium: 8.5 mg/dL — ABNORMAL LOW (ref 8.9–10.3)
Chloride: 106 mmol/L (ref 101–111)
Creatinine, Ser: 0.39 mg/dL — ABNORMAL LOW (ref 0.44–1.00)
GFR calc Af Amer: >60 mL/min (ref >60)
GFR calc non Af Amer: >60 mL/min (ref >60)
GLUCOSE: 126 mg/dL — AB (ref 65–99)
POTASSIUM: 3.1 mmol/L — AB (ref 3.5–5.1)
SODIUM: 135 mmol/L (ref 135–145)
TOTAL PROTEIN: 6.7 g/dL (ref 6.5–8.1)

## 2017-05-30 LAB — PROTEIN / CREATININE RATIO, URINE
Creatinine, Urine: 174 mg/dL
PROTEIN CREATININE RATIO: 0.13 mg/mg{creat} (ref 0.00–0.15)
Total Protein, Urine: 23 mg/dL

## 2017-05-30 MED ORDER — BUTALBITAL-APAP-CAFFEINE 50-325-40 MG PO TABS
2.0000 | ORAL_TABLET | Freq: Once | ORAL | Status: AC
  Administered 2017-05-30: 2 via ORAL
  Filled 2017-05-30: qty 2

## 2017-05-30 NOTE — MAU Note (Signed)
Pt sent from MD office with elevated BP - 152/74.  Pt also C/O HA for the last week, relieved some with tylenol, blurred vision @ times.  Denies epigastric pain but states she has pitting edema.  Denies contractions, bleeding or LOF.

## 2017-05-30 NOTE — Discharge Instructions (Signed)
Hypertension During Pregnancy °Hypertension, commonly called high blood pressure, is when the force of blood pumping through your arteries is too strong. Arteries are blood vessels that carry blood from the heart throughout the body. Hypertension during pregnancy can cause problems for you and your baby. Your baby may be born early (prematurely) or may not weigh as much as he or she should at birth. Very bad cases of hypertension during pregnancy can be life-threatening. °Different types of hypertension can occur during pregnancy. These include: °· Chronic hypertension. This happens when: °? You have hypertension before pregnancy and it continues during pregnancy. °? You develop hypertension before you are [redacted] weeks pregnant, and it continues during pregnancy. °· Gestational hypertension. This is hypertension that develops after the 20th week of pregnancy. °· Preeclampsia, also called toxemia of pregnancy. This is a very serious type of hypertension that develops only during pregnancy. It affects the whole body, and it can be very dangerous for you and your baby. ° °Gestational hypertension and preeclampsia usually go away within 6 weeks after your baby is born. Women who have hypertension during pregnancy have a greater chance of developing hypertension later in life or during future pregnancies. °What are the causes? °The exact cause of hypertension is not known. °What increases the risk? °There are certain factors that make it more likely for you to develop hypertension during pregnancy. These include: °· Having hypertension during a previous pregnancy or prior to pregnancy. °· Being overweight. °· Being older than age 40. °· Being pregnant for the first time or being pregnant with more than one baby. °· Becoming pregnant using fertilization methods such as IVF (in vitro fertilization). °· Having diabetes, kidney problems, or systemic lupus erythematosus. °· Having a family history of hypertension. ° °What are the  signs or symptoms? °Chronic hypertension and gestational hypertension rarely cause symptoms. Preeclampsia causes symptoms, which may include: °· Increased protein in your urine. Your health care provider will check for this at every visit before you give birth (prenatal visit). °· Severe headaches. °· Sudden weight gain. °· Swelling of the hands, face, legs, and feet. °· Nausea and vomiting. °· Vision problems, such as blurred or double vision. °· Numbness in the face, arms, legs, and feet. °· Dizziness. °· Slurred speech. °· Sensitivity to bright lights. °· Abdominal pain. °· Convulsions. ° °How is this diagnosed? °You may be diagnosed with hypertension during a routine prenatal exam. At each prenatal visit, you may: °· Have a urine test to check for high amounts of protein in your urine. °· Have your blood pressure checked. A blood pressure reading is recorded as two numbers, such as "120 over 80" (or 120/80). The first ("top") number is called the systolic pressure. It is a measure of the pressure in your arteries when your heart beats. The second ("bottom") number is called the diastolic pressure. It is a measure of the pressure in your arteries as your heart relaxes between beats. Blood pressure is measured in a unit called mm Hg. A normal blood pressure reading is: °? Systolic: below 120. °? Diastolic: below 80. ° °The type of hypertension that you are diagnosed with depends on your test results and when your symptoms developed. °· Chronic hypertension is usually diagnosed before 20 weeks of pregnancy. °· Gestational hypertension is usually diagnosed after 20 weeks of pregnancy. °· Hypertension with high amounts of protein in the urine is diagnosed as preeclampsia. °· Blood pressure measurements that stay above 160 systolic, or above 110 diastolic, are   signs of severe preeclampsia. ° °How is this treated? °Treatment for hypertension during pregnancy varies depending on the type of hypertension you have and how  serious it is. °· If you take medicines called ACE inhibitors to treat chronic hypertension, you may need to switch medicines. ACE inhibitors should not be taken during pregnancy. °· If you have gestational hypertension, you may need to take blood pressure medicine. °· If you are at risk for preeclampsia, your health care provider may recommend that you take a low-dose aspirin every day to prevent high blood pressure during your pregnancy. °· If you have severe preeclampsia, you may need to be hospitalized so you and your baby can be monitored closely. You may also need to take medicine (magnesium sulfate) to prevent seizures and to lower blood pressure. This medicine may be given as an injection or through an IV tube. °· In some cases, if your condition gets worse, you may need to deliver your baby early. ° °Follow these instructions at home: °Eating and drinking °· Drink enough fluid to keep your urine clear or pale yellow. °· Eat a healthy diet that is low in salt (sodium). Do not add salt to your food. Check food labels to see how much sodium a food or beverage contains. °Lifestyle °· Do not use any products that contain nicotine or tobacco, such as cigarettes and e-cigarettes. If you need help quitting, ask your health care provider. °· Do not use alcohol. °· Avoid caffeine. °· Avoid stress as much as possible. Rest and get plenty of sleep. °General instructions °· Take over-the-counter and prescription medicines only as told by your health care provider. °· While lying down, lie on your left side. This keeps pressure off your baby. °· While sitting or lying down, raise (elevate) your feet. Try putting some pillows under your lower legs. °· Exercise regularly. Ask your health care provider what kinds of exercise are best for you. °· Keep all prenatal and follow-up visits as told by your health care provider. This is important. °Contact a health care provider if: °· You have symptoms that your health care  provider told you may require more treatment or monitoring, such as: °? Fever. °? Vomiting. °? Headache. °Get help right away if: °· You have severe abdominal pain or vomiting that does not get better with treatment. °· You suddenly develop swelling in your hands, ankles, or face. °· You gain 4 lbs (1.8 kg) or more in 1 week. °· You develop vaginal bleeding, or you have blood in your urine. °· You do not feel your baby moving as much as usual. °· You have blurred or double vision. °· You have muscle twitching or sudden tightening (spasms). °· You have shortness of breath. °· Your lips or fingernails turn blue. °This information is not intended to replace advice given to you by your health care provider. Make sure you discuss any questions you have with your health care provider. °Document Released: 02/12/2011 Document Revised: 12/15/2015 Document Reviewed: 11/10/2015 °[employer]vier Interactive Patient Education © 2018 [employer] ° °Safe Medications in Pregnancy  ° °Acne: °Benzoyl Peroxide °Salicylic Acid ° °Backache/Headache: °Tylenol: 2 regular strength every 4 hours OR °             2 Extra strength every 6 hours ° °Colds/Coughs/Allergies: °Benadryl (alcohol free) 25 mg every 6 hours as needed °Breath right strips °Claritin °Cepacol throat lozenges °Chloraseptic throat spray °Cold-Eeze- up to three times per day °Cough drops, alcohol free °Flonase (by prescription   only) °Guaifenesin °Mucinex °Robitussin DM (plain only, alcohol free) °Saline nasal spray/drops °Sudafed (pseudoephedrine) & Actifed ** use only after [redacted] weeks gestation and if you do not have high blood pressure °Tylenol °Vicks Vaporub °Zinc lozenges °Zyrtec  ° °Constipation: °Colace °Ducolax suppositories °Fleet enema °Glycerin suppositories °Metamucil °Milk of magnesia °Miralax °Senokot °Smooth move tea ° °Diarrhea: °Kaopectate °Imodium A-D ° °*NO pepto Bismol ° °Hemorrhoids: °Anusol °Anusol HC °Preparation  H °Tucks ° °Indigestion: °Tums °Maalox °Mylanta °Zantac  °Pepcid ° °Insomnia: °Benadryl (alcohol free) 25mg every 6 hours as needed °Tylenol PM °Unisom, no Gelcaps ° °Leg Cramps: °Tums °MagGel ° °Nausea/Vomiting:  °Bonine °Dramamine °Emetrol °Ginger extract °Sea bands °Meclizine  °Nausea medication to take during pregnancy:  °Unisom (doxylamine succinate 25 mg tablets) Take one tablet daily at bedtime. If symptoms are not adequately controlled, the dose can be increased to a maximum recommended dose of two tablets daily (1/2 tablet in the morning, 1/2 tablet mid-afternoon and one at bedtime). °Vitamin B6 100mg tablets. Take one tablet twice a day (up to 200 mg per day). ° °Skin Rashes: °Aveeno products °Benadryl cream or 25mg every 6 hours as needed °Calamine Lotion °1% cortisone cream ° °Yeast infection: °Gyne-lotrimin 7 °Monistat 7 ° ° °**If taking multiple medications, please check labels to avoid duplicating the same active ingredients °**take medication as directed on the label °** Do not exceed 4000 mg of tylenol in 24 hours °**Do not take medications that contain aspirin or ibuprofen ° ° ° ° °

## 2017-05-30 NOTE — MAU Provider Note (Signed)
History     CSN: [id number]  Arrival date and time: 05/30/17 1225   First Provider Initiated Contact with Patient 05/30/17 1336     Chief Complaint  Patient presents with  . Hypertension   HPI Cynthia Townsend is a 36 y.o. G3P1011 at [redacted]w[redacted]d who presents from the office for evaluation for preeclampsia. She had an elevated BP in the office that was 150s/90s. She also has a headache that she rates a 5/10 and has not tried anything for. Denies any visual changes or epigastric pain. She denies contractions, vaginal bleeding or leaking of fluid. Reports good fetal movement. She has a hx of postpartum preeclampsia with her previous pregnancy.    OB History    Gravida Para Term Preterm AB Living   3 1 1  0 1 1   SAB TAB Ectopic Multiple Live Births   0 0 1 0 1      Past Medical History:  Diagnosis Date  . ADD (attention deficit disorder)   . Anemia    post partum  . Anemia requiring transfusions    of iron  . Anxiety   . Asthma   . Chicken pox   . Classic migraine with aura 09/20/2015  . Depression   . Ectopic pregnancy   . Insomnia   . Laceration of finger    left index with nerve involvement  . Nasal polyps    denies  . Pneumonia   . Postpartum hemorrhage   . Pre-eclampsia in postpartum period   . Pregnancy induced hypertension   . Pulmonary edema   . Recurrent upper respiratory infection (URI)   . Sciatic pain   . Subclinical hypothyroidism   . Urticaria    dogs  . Vitamin D deficiency     Past Surgical History:  Procedure Laterality Date  . BREAST BIOPSY     benign  punch bx  . DILATION AND EVACUATION N/A 09/04/2015   Procedure: DILATATION AND EVACUATION;  Surgeon: Mitchel Honour, DO;  Location: [facility];  Service: Gynecology;  Laterality: N/A;  . DNC    . I&D EXTREMITY Left 04/10/2017   Procedure: LEFT INDEX FINGER WOUND REPAIR AND NERVE REPAIR;  Surgeon: Bradly Bienenstock, MD;  Location: [facility] OR;  Service: Orthopedics;  Laterality: Left;    Family History  Problem  Relation Age of Onset  . Alcohol abuse Mother   . Lung cancer Mother   . Hyperlipidemia Mother   . Heart disease Mother   . Mental illness Mother   . Hyperthyroidism Mother   . COPD Mother   . Hyperlipidemia Father   . Heart disease Father   . Transient ischemic attack Father   . Pulmonary embolism Father   . Heart disease Sister   . Mental illness Sister   . Heart attack Sister   . Diabetes Sister   . Rheum arthritis Maternal Grandmother   . Heart disease Maternal Uncle     Social History   Tobacco Use  . Smoking status: Former Smoker    Types: Cigarettes    Last attempt to quit: 10/08/2016    Years since quitting: 0.6  . Smokeless tobacco: Never Used  Substance Use Topics  . Alcohol use: No  . Drug use: No    Allergies:  Allergies  Allergen Reactions  . Penicillins Shortness Of Breath, Swelling, Rash and Other (See Comments)    Has patient had a PCN reaction causing immediate rash, facial/tongue/throat swelling, SOB or lightheadedness with hypotension: Yes Has patient had a  PCN reaction causing severe rash involving mucus membranes or skin necrosis: No Has patient had a PCN reaction that required hospitalization: No Has patient had a PCN reaction occurring within the last 10 years: No If all of the above answers are "NO", then may proceed with Cephalosporin use.    . Sulfa Antibiotics Hives and Rash    Medications Prior to Admission  Medication Sig Dispense Refill Last Dose  . acetaminophen (TYLENOL) 500 MG tablet Take 1,000 mg by mouth as needed for moderate pain or headache.    04/10/2017 at 1000  . acetaminophen-codeine (TYLENOL #3) 300-30 MG tablet One every 4 hours as needed for cough (Patient not taking: Reported on 04/07/2017) 28 tablet 0 Completed Course at Unknown time  . albuterol (PROVENTIL HFA;VENTOLIN HFA) 108 (90 Base) MCG/ACT inhaler Inhale 2 puffs into the lungs every 6 (six) hours as needed for wheezing or shortness of breath. (Patient taking  differently: Inhale 2 puffs into the lungs every 4 (four) hours as needed for wheezing or shortness of breath. ) 1 Inhaler 1 More than a month at Unknown time  . ALPRAZolam (XANAX) 0.5 MG tablet TAKE 1 TABLET BY MOUTH ONCE DAILY AS NEEDED FOR ANXIETY 30 tablet 0 More than a month at Unknown time  . amphetamine-dextroamphetamine (ADDERALL) 10 MG tablet Take 1 tablet (10 mg total) by mouth daily as needed. (Patient taking differently: Take 10 mg by mouth daily at 3 pm. ) 30 tablet 0 More than a month at Unknown time  . amphetamine-dextroamphetamine (ADDERALL) 30 MG tablet Take 1 tablet by mouth daily. 30 tablet 0 More than a month at Unknown time  . aspirin 81 MG chewable tablet Chew 81 mg by mouth daily at 3 pm.    Past Week at Unknown time  . budesonide (PULMICORT FLEXHALER) 180 MCG/ACT inhaler Inhale 2 puffs into the lungs 2 (two) times daily. 1 each 3 Past Week at Unknown time  . budesonide (RHINOCORT AQUA) 32 MCG/ACT nasal spray 2 sprays in each nostril 1-2 times daily 5 mL 3 Unknown at Unknown time  . budesonide-formoterol (SYMBICORT) 80-4.5 MCG/ACT inhaler Inhale 2 puffs into the lungs 2 (two) times daily. (Patient not taking: Reported on 04/01/2017) 1 Inhaler 3 Not Taking at Unknown time  . butalbital-acetaminophen-caffeine (FIORICET, ESGIC) 50-325-40 MG tablet Take 1 tablet by mouth every 4 (four) hours as needed for headache. 30 tablet 1 More than a month at Unknown time  . calcium carbonate (TUMS - DOSED IN MG ELEMENTAL CALCIUM) 500 MG chewable tablet Chew 2-3 tablets by mouth daily as needed for indigestion or heartburn.   Past Week at Unknown time  . diphenhydrAMINE (BENADRYL) 25 MG tablet Take 75 mg by mouth at bedtime.   04/09/2017 at Unknown time  . loratadine (CLARITIN) 10 MG tablet Take 10 mg by mouth daily as needed for allergies.   More than a month at Unknown time  . Prenatal Vit-Fe Fumarate-FA (PRENATAL PO) Take 1 tablet by mouth every evening.   04/09/2017 at Unknown time  .  traZODone (DESYREL) 100 MG tablet Take 3 tablets (300 mg total) by mouth at bedtime. 270 tablet 1 More than a month at Unknown time    Review of Systems  Constitutional: Negative.  Negative for fatigue and fever.  HENT: Negative.   Respiratory: Negative.  Negative for shortness of breath.   Cardiovascular: Negative.  Negative for chest pain.  Gastrointestinal: Negative.  Negative for abdominal pain, constipation, diarrhea, nausea and vomiting.  Genitourinary: Negative.  Negative  for dysuria.  Neurological: Positive for headaches. Negative for dizziness.   Physical Exam   Blood pressure 130/79, pulse 87, temperature 98 F (36.7 C), temperature source Oral, resp. rate 20, height 5\' 3"  (1.6 m), weight 226 lb (102.5 kg), last menstrual period 10/30/2016, currently breastfeeding.  Physical Exam  Nursing note and vitals reviewed. Constitutional: She is oriented to person, place, and time. She appears well-developed and well-nourished. No distress.  HENT:  Head: Normocephalic.  Eyes: Pupils are equal, round, and reactive to light.  Cardiovascular: Normal rate, regular rhythm and normal heart sounds.  Respiratory: Effort normal and breath sounds normal. No respiratory distress.  GI: Soft. Bowel sounds are normal. She exhibits no distension. There is no tenderness.  Neurological: She is alert and oriented to person, place, and time. She has normal reflexes. She displays normal reflexes. No cranial nerve deficit. Coordination normal.  Skin: Skin is warm and dry.  Psychiatric: She has a normal mood and affect. Her behavior is normal. Judgment and thought content normal.   Fetal Tracing:  Baseline: 140 Variability: moderate Accels: 15x15 Decels: none  Toco: occasional uc's  MAU Course  Procedures Results for orders placed or performed during the hospital encounter of 05/30/17 (from the past 24 hour(s))  Protein / creatinine ratio, urine     Status: None   Collection Time: 05/30/17  12:52 PM  Result Value Ref Range   Creatinine, Urine 174.00 mg/dL   Total Protein, Urine 23 mg/dL   Protein Creatinine Ratio 0.13 0.00 - 0.15 mg/mg[Cre]  Urinalysis, Routine w reflex microscopic     Status: Abnormal   Collection Time: 05/30/17 12:52 PM  Result Value Ref Range   Color, Urine YELLOW YELLOW   APPearance HAZY (A) CLEAR   Specific Gravity, Urine 1.024 1.005 - 1.030   pH 6.0 5.0 - 8.0   Glucose, UA NEGATIVE NEGATIVE mg/dL   Hgb urine dipstick NEGATIVE NEGATIVE   Bilirubin Urine NEGATIVE NEGATIVE   Ketones, ur 20 (A) NEGATIVE mg/dL   Protein, ur 30 (A) NEGATIVE mg/dL   Nitrite NEGATIVE NEGATIVE   Leukocytes, UA NEGATIVE NEGATIVE   RBC / HPF 6-30 0 - 5 RBC/hpf   WBC, UA 0-5 0 - 5 WBC/hpf   Bacteria, UA RARE (A) NONE SEEN   Squamous Epithelial / LPF 6-30 (A) NONE SEEN   Mucus PRESENT    Ca Oxalate Crys, UA PRESENT   CBC     Status: Abnormal   Collection Time: 05/30/17  1:00 PM  Result Value Ref Range   WBC 7.9 4.0 - 10.5 K/uL   RBC 3.64 (L) 3.87 - 5.11 MIL/uL   Hemoglobin 10.9 (L) 12.0 - 15.0 g/dL   HCT 62.132.3 (L) 30.836.0 - 65.746.0 %   MCV 88.7 78.0 - 100.0 fL   MCH 29.9 26.0 - 34.0 pg   MCHC 33.7 30.0 - 36.0 g/dL   RDW 84.614.5 96.211.5 - 95.215.5 %   Platelets 220 150 - 400 K/uL  Comprehensive metabolic panel     Status: Abnormal   Collection Time: 05/30/17  1:00 PM  Result Value Ref Range   Sodium 135 135 - 145 mmol/L   Potassium 3.1 (L) 3.5 - 5.1 mmol/L   Chloride 106 101 - 111 mmol/L   CO2 20 (L) 22 - 32 mmol/L   Glucose, Bld 126 (H) 65 - 99 mg/dL   BUN <5 (L) 6 - 20 mg/dL   Creatinine, Ser 8.410.39 (L) 0.44 - 1.00 mg/dL   Calcium 8.5 (L) 8.9 -  10.3 mg/dL   Total Protein 6.7 6.5 - 8.1 g/dL   Albumin 3.0 (L) 3.5 - 5.0 g/dL   AST 17 15 - 41 U/L   ALT 14 14 - 54 U/L   Alkaline Phosphatase 116 38 - 126 U/L   Total Bilirubin 0.3 0.3 - 1.2 mg/dL   GFR calc non Af Amer >60 >60 mL/min   GFR calc Af Amer >60 >60 mL/min   Anion gap 9 5 - 15   MDM UA CBC, CMP, Protein/creat  ratio- wnl Fioricet PO Reviewed results with Dr. Evans LanceLeger-ok to discharge patient home and follow up in office next week for BP check  Assessment and Plan   1. Transient hypertension of pregnancy in third trimester   2. [redacted] weeks gestation of pregnancy    -Discharge home in stable condition -Encouraged patient to use fioricet for headaches not relieved with tylenol -Preeclampsia precautions discussed -Patient advised to follow-up with [facility] week for a BP check -Patient may return to MAU as needed or if her condition were to change or worsen   Rolm BookbinderCaroline M Taheerah Guldin CNM 05/30/2017, 2:58 PM

## 2017-06-10 NOTE — L&D Delivery Note (Signed)
Delivery Note At 12:44 AM a viable female was delivered via Vaginal, Spontaneous (Presentation: ROA).  APGARs: 8, 9; weight pending.   Placenta status: S, I. 3V Cord: loose nuchal x 1 with the following complications: none.  Cord pH: n/a  Anesthesia:  CLEA Episiotomy: None Lacerations:  2nd degree, right periurethral Suture Repair: 3.0 vicryl rapide Est. Blood Loss (mL):  250  Mom to postpartum.  Baby to Couplet care / Skin to Skin.  Cynthia Townsend 07/16/2017, 1:13 AM

## 2017-06-12 DIAGNOSIS — O139 Gestational [pregnancy-induced] hypertension without significant proteinuria, unspecified trimester: Secondary | ICD-10-CM | POA: Diagnosis not present

## 2017-06-20 DIAGNOSIS — Z3400 Encounter for supervision of normal first pregnancy, unspecified trimester: Secondary | ICD-10-CM | POA: Diagnosis not present

## 2017-06-20 DIAGNOSIS — Z3A34 34 weeks gestation of pregnancy: Secondary | ICD-10-CM | POA: Diagnosis not present

## 2017-06-20 DIAGNOSIS — O132 Gestational [pregnancy-induced] hypertension without significant proteinuria, second trimester: Secondary | ICD-10-CM | POA: Diagnosis not present

## 2017-06-23 ENCOUNTER — Inpatient Hospital Stay (HOSPITAL_COMMUNITY)
Admission: AD | Admit: 2017-06-23 | Discharge: 2017-06-24 | Disposition: A | Discharge status: Home / Self Care | Payer: 59 | Source: Ambulatory Visit | Attending: Obstetrics and Gynecology | Admitting: Obstetrics and Gynecology

## 2017-06-23 ENCOUNTER — Encounter (HOSPITAL_COMMUNITY): Payer: Self-pay | Admitting: *Deleted

## 2017-06-23 DIAGNOSIS — Z880 Allergy status to penicillin: Secondary | ICD-10-CM | POA: Insufficient documentation

## 2017-06-23 DIAGNOSIS — Z3A34 34 weeks gestation of pregnancy: Secondary | ICD-10-CM | POA: Diagnosis not present

## 2017-06-23 DIAGNOSIS — F410 Panic disorder [episodic paroxysmal anxiety] without agoraphobia: Secondary | ICD-10-CM | POA: Insufficient documentation

## 2017-06-23 DIAGNOSIS — O9989 Other specified diseases and conditions complicating pregnancy, childbirth and the puerperium: Secondary | ICD-10-CM | POA: Diagnosis not present

## 2017-06-23 DIAGNOSIS — D649 Anemia, unspecified: Secondary | ICD-10-CM | POA: Diagnosis not present

## 2017-06-23 DIAGNOSIS — Z87891 Personal history of nicotine dependence: Secondary | ICD-10-CM | POA: Insufficient documentation

## 2017-06-23 DIAGNOSIS — O99343 Other mental disorders complicating pregnancy, third trimester: Secondary | ICD-10-CM | POA: Insufficient documentation

## 2017-06-23 DIAGNOSIS — F419 Anxiety disorder, unspecified: Secondary | ICD-10-CM

## 2017-06-23 DIAGNOSIS — Z3493 Encounter for supervision of normal pregnancy, unspecified, third trimester: Secondary | ICD-10-CM | POA: Diagnosis not present

## 2017-06-23 DIAGNOSIS — Z7982 Long term (current) use of aspirin: Secondary | ICD-10-CM | POA: Insufficient documentation

## 2017-06-23 DIAGNOSIS — R0602 Shortness of breath: Secondary | ICD-10-CM

## 2017-06-23 MED FILL — CEPHALEXIN 500 MG CAPSULE: 500 | 5 days supply | Qty: 15 | Fill #0

## 2017-06-23 NOTE — MAU Note (Signed)
Pt started keflex today and took two doses @ 1500 and 2000. She said as she was watching a video on her phone, she became anxious, short of breath  and had to remind herself to breathe. She tried to have her fiance calm her down and she took 0.25mg  xanax at 2030.  She didn't feel it helped and was afraid she was having pulmonary edema again. No rash,  throat itching or closing.  Denies any vag bleeding, leaking or pain and says baby is moving a lot.

## 2017-06-23 NOTE — MAU Provider Note (Addendum)
History     CSN: [id number]  Arrival date and time: 06/23/17 2252   None     Chief Complaint  Patient presents with  . Shortness of Breath   37 yo G3P1011 at 3582w4d here for acute, new onset shortness of breath that started this afternoon after taking initial dose of keflex. Patient has PMH significant for recurrent pneumonia and pulmonary edema and new diagnosis of asthma for which she takes pulmicort twice daily. She also has history of anxiety for which she takes xanax as needed for the past 10 years for panic attacks. Patient woke this morning with upper respiratory symptoms of sore throat and runny nose. She was evaluated in the office by her OB/GYN and prescribed keflex in case of bacterial infection. Patient took 2 doses of keflex prior to shortness of breathing starting. She says lying flat makes shortness of breath worse. Albuterol and xanax did not make better. No associated fever, sweats, chills, or cough.     OB History    Gravida Para Term Preterm AB Living   3 1 1  0 1 1   SAB TAB Ectopic Multiple Live Births   0 0 1 0 1      Past Medical History:  Diagnosis Date  . ADD (attention deficit disorder)   . Anemia    post partum  . Anemia requiring transfusions    of iron  . Anxiety   . Asthma   . Chicken pox   . Classic migraine with aura 09/20/2015  . Depression   . Ectopic pregnancy   . Insomnia   . Laceration of finger    left index with nerve involvement  . Nasal polyps    denies  . Pneumonia   . Postpartum hemorrhage   . Pre-eclampsia in postpartum period   . Pregnancy induced hypertension   . Pulmonary edema   . Recurrent upper respiratory infection (URI)   . Sciatic pain   . Subclinical hypothyroidism   . Urticaria    dogs  . Vitamin D deficiency     Past Surgical History:  Procedure Laterality Date  . BREAST BIOPSY     benign  punch bx  . DILATION AND EVACUATION N/A 09/04/2015   Procedure: DILATATION AND EVACUATION;  Surgeon: Mitchel HonourMegan Morris,  DO;  Location: [facility];  Service: Gynecology;  Laterality: N/A;  . DNC    . I&D EXTREMITY Left 04/10/2017   Procedure: LEFT INDEX FINGER WOUND REPAIR AND NERVE REPAIR;  Surgeon: Bradly Bienenstockrtmann, Fred, MD;  Location: [facility];  Service: Orthopedics;  Laterality: Left;    Family History  Problem Relation Age of Onset  . Alcohol abuse Mother   . Lung cancer Mother   . Hyperlipidemia Mother   . Heart disease Mother   . Mental illness Mother   . Hyperthyroidism Mother   . COPD Mother   . Hyperlipidemia Father   . Heart disease Father   . Transient ischemic attack Father   . Pulmonary embolism Father   . Heart disease Sister   . Mental illness Sister   . Heart attack Sister   . Diabetes Sister   . Rheum arthritis Maternal Grandmother   . Heart disease Maternal Uncle     Social History   Tobacco Use  . Smoking status: Former Smoker    Types: Cigarettes    Last attempt to quit: 10/08/2016    Years since quitting: 0.7  . Smokeless tobacco: Never Used  Substance Use Topics  . Alcohol use:  No  . Drug use: No    Allergies:  Allergies  Allergen Reactions  . Penicillins Shortness Of Breath, Swelling, Rash and Other (See Comments)    Has patient had a PCN reaction causing immediate rash, facial/tongue/throat swelling, SOB or lightheadedness with hypotension: Yes Has patient had a PCN reaction causing severe rash involving mucus membranes or skin necrosis: No Has patient had a PCN reaction that required hospitalization: No Has patient had a PCN reaction occurring within the last 10 years: No If all of the above answers are "NO", then may proceed with Cephalosporin use.    . Sulfa Antibiotics Hives and Rash    Medications Prior to Admission  Medication Sig Dispense Refill Last Dose  . acetaminophen (TYLENOL) 500 MG tablet Take 1,000 mg by mouth as needed for moderate pain or headache.    06/22/2017 at 2000  . albuterol (PROVENTIL HFA;VENTOLIN HFA) 108 (90 Base) MCG/ACT inhaler Inhale 2 puffs  into the lungs every 6 (six) hours as needed for wheezing or shortness of breath. (Patient taking differently: Inhale 2 puffs into the lungs every 4 (four) hours as needed for wheezing or shortness of breath. ) 1 Inhaler 1 06/22/2017 at Unknown time  . ALPRAZolam (XANAX) 0.25 MG tablet Take 0.25 mg by mouth at bedtime as needed for anxiety.   06/22/2017 at 2030  . aspirin 81 MG chewable tablet Chew 81 mg by mouth daily at 3 pm.    06/22/2017 at Unknown time  . butalbital-acetaminophen-caffeine (FIORICET, ESGIC) 50-325-40 MG tablet Take 1 tablet by mouth every 4 (four) hours as needed for headache. 30 tablet 1 Past Month at Unknown time  . calcium carbonate (TUMS - DOSED IN MG ELEMENTAL CALCIUM) 500 MG chewable tablet Chew 2-3 tablets by mouth daily as needed for indigestion or heartburn.   06/22/2017 at Unknown time  . diphenhydrAMINE (BENADRYL) 25 MG tablet Take 75 mg by mouth at bedtime.   06/22/2017 at Unknown time  . Prenatal Vit-Fe Fumarate-FA (PRENATAL PO) Take 1 tablet by mouth every evening.   06/22/2017 at Unknown time  . budesonide (PULMICORT FLEXHALER) 180 MCG/ACT inhaler Inhale 2 puffs into the lungs 2 (two) times daily. 1 each 3 More than a month at Unknown time  . budesonide (RHINOCORT AQUA) 32 MCG/ACT nasal spray 2 sprays in each nostril 1-2 times daily 5 mL 3 More than a month at Unknown time  . budesonide-formoterol (SYMBICORT) 80-4.5 MCG/ACT inhaler Inhale 2 puffs into the lungs 2 (two) times daily. (Patient not taking: Reported on 04/01/2017) 1 Inhaler 3 More than a month at Unknown time  . loratadine (CLARITIN) 10 MG tablet Take 10 mg by mouth daily as needed for allergies.   More than a month at Unknown time    Review of Systems  Constitutional: Negative for chills and fever.  HENT: Positive for congestion, rhinorrhea and sore throat.   Eyes: Negative for discharge and itching.  Respiratory: Positive for shortness of breath. Negative for cough.   Cardiovascular: Negative for chest  pain and leg swelling.  Gastrointestinal: Negative for abdominal distention and abdominal pain.  Endocrine: Negative for cold intolerance and heat intolerance.  Genitourinary: Negative for dysuria and flank pain.  Musculoskeletal: Negative for arthralgias and back pain.  Neurological: Negative for dizziness and light-headedness.  Hematological: Negative for adenopathy. Does not bruise/bleed easily.   Physical Exam   Blood pressure (!) 146/69, pulse 99, temperature 98.3 F (36.8 C), temperature source Oral, resp. rate 20, height 5\' 3"  (1.6 m), weight 96.6  kg (213 lb), last menstrual period 10/30/2016, SpO2 98 %, currently breastfeeding.  Physical Exam  Constitutional: She is oriented to person, place, and time. She appears well-developed and well-nourished.  Patient is crying during encounter  HENT:  Head: Normocephalic and atraumatic.  Eyes: Conjunctivae are normal. Pupils are equal, round, and reactive to light.  Neck: Normal range of motion. Neck supple.  Cardiovascular: Normal rate and normal heart sounds.  No murmur heard. Respiratory: She has no wheezes.  tachypnea and unable to complete sentences without stopping to take a breath. RLL with rales.  GI: Soft. There is no tenderness.  Musculoskeletal: Normal range of motion. She exhibits no edema.  Neurological: She is alert and oriented to person, place, and time.  Skin: Skin is warm.  Psychiatric: Judgment normal.  Appears anxious, wringing her hands and crying    MAU Course  Procedures EFM: 130 bpm/mod var/pos acels/no decels- cat 1  MDM Uncertain etiology at this time. Consider acute respiratory distress due to pneumonia, will get CXR. Consider asthma exacerbation but no wheezing on exam. Consider allergic reaction to keflex which may cause anaphylaxis and agitation. Patient declines steroids at this time. Consider panic attack but would have expected xanax to help symptoms. Patient declines vistaril at this time.  CXR  wnl. Patient's symptoms are completely resolved without any intervention. Patient says she thinks she had a "panic attack." Discussed with Dr. Renaldo Fiddler. Discharge home in stable condition.  Assessment and Plan  1. Pregnancy at [redacted] weeks gestation completed- follow up as scheduled in office. 2. Acute panic attack- discharge home with vistaril prn. Follow up as outpatient.  [employer] 06/24/2017, 1:49 AM

## 2017-06-24 ENCOUNTER — Inpatient Hospital Stay (HOSPITAL_COMMUNITY): Payer: 59

## 2017-06-24 DIAGNOSIS — O9989 Other specified diseases and conditions complicating pregnancy, childbirth and the puerperium: Secondary | ICD-10-CM

## 2017-06-24 DIAGNOSIS — R0602 Shortness of breath: Secondary | ICD-10-CM | POA: Diagnosis present

## 2017-06-24 DIAGNOSIS — Z3A32 32 weeks gestation of pregnancy: Secondary | ICD-10-CM | POA: Diagnosis not present

## 2017-06-24 DIAGNOSIS — Z880 Allergy status to penicillin: Secondary | ICD-10-CM | POA: Diagnosis not present

## 2017-06-24 DIAGNOSIS — O99343 Other mental disorders complicating pregnancy, third trimester: Secondary | ICD-10-CM | POA: Diagnosis not present

## 2017-06-24 DIAGNOSIS — Z7982 Long term (current) use of aspirin: Secondary | ICD-10-CM | POA: Diagnosis not present

## 2017-06-24 DIAGNOSIS — Z3A34 34 weeks gestation of pregnancy: Secondary | ICD-10-CM | POA: Diagnosis not present

## 2017-06-24 DIAGNOSIS — Z87891 Personal history of nicotine dependence: Secondary | ICD-10-CM | POA: Diagnosis not present

## 2017-06-24 DIAGNOSIS — R0600 Dyspnea, unspecified: Secondary | ICD-10-CM | POA: Diagnosis not present

## 2017-06-24 DIAGNOSIS — F410 Panic disorder [episodic paroxysmal anxiety] without agoraphobia: Secondary | ICD-10-CM | POA: Diagnosis not present

## 2017-06-24 DIAGNOSIS — F419 Anxiety disorder, unspecified: Secondary | ICD-10-CM | POA: Diagnosis not present

## 2017-06-24 MED ORDER — HYDROXYZINE HCL 25 MG PO TABS: 25.0000 mg | ORAL_TABLET | Freq: Four times a day (QID) | ORAL | 0 refills | Status: DC

## 2017-06-24 MED FILL — hydrOXYzine HCL 25 MG TABS: 25 | 2 days supply | Qty: 5 | Fill #0

## 2017-06-24 NOTE — Discharge Instructions (Signed)

## 2017-06-26 ENCOUNTER — Encounter: Payer: Self-pay | Admitting: Internal Medicine

## 2017-06-26 ENCOUNTER — Ambulatory Visit: Payer: 59 | Admitting: Internal Medicine

## 2017-06-26 ENCOUNTER — Other Ambulatory Visit (INDEPENDENT_AMBULATORY_CARE_PROVIDER_SITE_OTHER): Payer: 59

## 2017-06-26 ENCOUNTER — Ambulatory Visit (INDEPENDENT_AMBULATORY_CARE_PROVIDER_SITE_OTHER): Payer: 59 | Admitting: Cardiology

## 2017-06-26 ENCOUNTER — Encounter: Payer: Self-pay | Admitting: Cardiology

## 2017-06-26 VITALS — BP 148/92 | HR 99 | Temp 97.6°F | Ht 63.0 in | Wt 230.0 lb

## 2017-06-26 VITALS — BP 142/86 | HR 95 | Ht 63.0 in | Wt 230.0 lb

## 2017-06-26 DIAGNOSIS — Z3400 Encounter for supervision of normal first pregnancy, unspecified trimester: Secondary | ICD-10-CM | POA: Diagnosis not present

## 2017-06-26 DIAGNOSIS — O133 Gestational [pregnancy-induced] hypertension without significant proteinuria, third trimester: Secondary | ICD-10-CM

## 2017-06-26 DIAGNOSIS — R739 Hyperglycemia, unspecified: Secondary | ICD-10-CM | POA: Diagnosis not present

## 2017-06-26 DIAGNOSIS — D508 Other iron deficiency anemias: Secondary | ICD-10-CM

## 2017-06-26 DIAGNOSIS — F411 Generalized anxiety disorder: Secondary | ICD-10-CM | POA: Diagnosis not present

## 2017-06-26 DIAGNOSIS — Z3A35 35 weeks gestation of pregnancy: Secondary | ICD-10-CM | POA: Diagnosis not present

## 2017-06-26 DIAGNOSIS — R0602 Shortness of breath: Secondary | ICD-10-CM | POA: Diagnosis not present

## 2017-06-26 DIAGNOSIS — E876 Hypokalemia: Secondary | ICD-10-CM | POA: Diagnosis not present

## 2017-06-26 DIAGNOSIS — O1493 Unspecified pre-eclampsia, third trimester: Secondary | ICD-10-CM

## 2017-06-26 DIAGNOSIS — O9989 Other specified diseases and conditions complicating pregnancy, childbirth and the puerperium: Secondary | ICD-10-CM | POA: Diagnosis not present

## 2017-06-26 LAB — CBC WITH DIFFERENTIAL/PLATELET
Basophils Absolute: 0 10*3/uL (ref 0.0–0.1)
Basophils Relative: 0.3 % (ref 0.0–3.0)
EOS PCT: 1.4 % (ref 0.0–5.0)
Eosinophils Absolute: 0.1 10*3/uL (ref 0.0–0.7)
HCT: 34.4 % — ABNORMAL LOW (ref 36.0–46.0)
Hemoglobin: 11.8 g/dL — ABNORMAL LOW (ref 12.0–15.0)
LYMPHS ABS: 1.1 10*3/uL (ref 0.7–4.0)
Lymphocytes Relative: 12.3 % (ref 12.0–46.0)
MCHC: 34.2 g/dL (ref 30.0–36.0)
MCV: 90.7 fl (ref 78.0–100.0)
MONO ABS: 0.5 10*3/uL (ref 0.1–1.0)
Monocytes Relative: 5.7 % (ref 3.0–12.0)
NEUTROS ABS: 7 10*3/uL (ref 1.4–7.7)
Neutrophils Relative %: 80.3 % — ABNORMAL HIGH (ref 43.0–77.0)
Platelets: 202 10*3/uL (ref 150.0–400.0)
RBC: 3.79 Mil/uL — ABNORMAL LOW (ref 3.87–5.11)
RDW: 14.7 % (ref 11.5–15.5)
WBC: 8.7 10*3/uL (ref 4.0–10.5)

## 2017-06-26 LAB — BASIC METABOLIC PANEL
BUN: 6 mg/dL (ref 6–23)
CHLORIDE: 103 meq/L (ref 96–112)
CO2: 24 meq/L (ref 19–32)
Calcium: 8.6 mg/dL (ref 8.4–10.5)
Creatinine, Ser: 0.36 mg/dL — ABNORMAL LOW (ref 0.40–1.20)
GFR: 215.34 mL/min (ref >60.00)
GLUCOSE: 101 mg/dL — AB (ref 70–99)
POTASSIUM: 3.5 meq/L (ref 3.5–5.1)
Sodium: 135 mEq/L (ref 135–145)

## 2017-06-26 LAB — HEMOGLOBIN A1C: Hgb A1c MFr Bld: 4.9 % (ref 4.6–6.5)

## 2017-06-26 MED ORDER — ALPRAZOLAM 0.25 MG PO TABS: 0.2500 mg | ORAL_TABLET | Freq: Two times a day (BID) | ORAL | 0 refills | Status: DC | PRN

## 2017-06-26 MED ORDER — LABETALOL HCL 200 MG PO TABS: 200.0000 mg | ORAL_TABLET | Freq: Two times a day (BID) | ORAL | 1 refills | Status: DC

## 2017-06-26 MED FILL — ALPRAZolam 0.25 MG TABS: 0.25 | 10 days supply | Qty: 20 | Fill #0

## 2017-06-26 MED FILL — LABETALOL HCL 200 MG TABLET: 200 | 90 days supply | Qty: 180 | Fill #0

## 2017-06-26 NOTE — Patient Instructions (Signed)
Medication Instructions:   START LABETALOL 200 MG TWICE DAILY AND LOG YOUR BP EVERYDAY, THEN CALL US OR MYCHART US IN 2 WEEKS TO REPORT YOUR READINGS.    Follow-Up:  AS NEEDED WITH DR NELSON--WILL BE BASED ON DELIVERY OF YOUR SON AND/OR BP READINGS       If you need a refill on your cardiac medications before your next appointment, please call your pharmacy.

## 2017-06-26 NOTE — Progress Notes (Signed)
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 The patient had first problem after she delivered her baby last year when she was diagnosed with postpartum preeclampsia. She was admitted with acute pulmonary edema and diuresed. She was doing okay since then with mild lower extremity edema. Her problem started again in October of this year when she presented with fever and cough and was prescribed azithromycin for acute bronchitis this was followed by 3 similar episodes and she was prescribed Levaquin 3 times. On 08/21/2016 she was diagnosed with strep throat and was treated with Keflex. This last Saturday she went to the urgent care with significant shortness of breath and worsening lower extremity edema inability to lay flat and was given a prescription for doxycycline and prednisone for acute bronchitis with bronchospasm.  03/13/2017 - this is 6 months follow-up, the patient is currently pregnant [redacted] weeks, she is feeling well, however since I saw her last time she had to episodes of pneumonia requiring antibiotic treatment. The patient is suspicious that mold in her rented house is causing some of the symptoms. She had pleuritic type of chest pain while she was diagnosed with pneumonia otherwise no exertional chest pain, she gets short of breath on moderate exertion and doesn't have any lower extremity edema no orthopnea paroxysmal nocturnal dyspnea. She is using Symbicort to maintain her asthma under control currently stable. The only concern right now is nasal congestion and wants advice on appropriate antihistaminics during pregnancy. She is scheduled to see an allergist. She gets very occasional palpitations  not associated with any other symptoms such as dizziness or chest pain no syncope.  06/26/2017 - 3 months follow-up, she is now [redacted] weeks pregnant, she is scheduled for induction first week of February because of hypertension and preeclampsia. She feels significantly short of breath, no lower extremity edema, she is dealing with acute bronchitis for which she was prescribed Keflex. She denies any fever or chills. She has received the flu shot. No orthopnea or paroxysmal nocturnal dyspnea. She has been dealing with significant anxiety when she wakes up frequently at night with panic attacks and shortness of breath. No chest pain.  Past Medical History:  Diagnosis Date  . ADD (attention deficit disorder)   . Anemia    post partum  . Anemia requiring transfusions    of iron  . Anxiety   . Asthma   . Chicken pox   . Classic migraine with aura 09/20/2015  . Depression   . Ectopic pregnancy   . Insomnia   . Laceration of finger    left index with nerve involvement  . Nasal polyps    denies  . Pneumonia   . Postpartum hemorrhage   . Pre-eclampsia in postpartum period   . Pregnancy induced hypertension   . Pulmonary edema   . Recurrent upper respiratory infection (URI)   . Sciatic pain   . Subclinical hypothyroidism   . Urticaria    dogs  . Vitamin D deficiency     Past Surgical History:  Procedure Laterality Date  . BREAST BIOPSY     benign  punch bx  . DILATION AND  EVACUATION N/A 09/04/2015   Procedure: DILATATION AND EVACUATION;  Surgeon: Mitchel Honour, DO;  Location: [facility];  Service: Gynecology;  Laterality: N/A;  . DNC    . I&D EXTREMITY Left 04/10/2017   Procedure: LEFT INDEX FINGER WOUND REPAIR AND NERVE REPAIR;  Surgeon: Bradly Bienenstock, MD;  Location: [facility];  Service: Orthopedics;  Laterality: Left;    Current Medications: Outpatient Medications Prior to Visit  Medication Sig Dispense Refill  . acetaminophen (TYLENOL) 500 MG tablet Take 1,000 mg by mouth as needed for  moderate pain or headache.     . albuterol (PROVENTIL HFA;VENTOLIN HFA) 108 (90 Base) MCG/ACT inhaler Inhale 2 puffs into the lungs every 6 (six) hours as needed for wheezing or shortness of breath. (Patient taking differently: Inhale 2 puffs into the lungs every 4 (four) hours as needed for wheezing or shortness of breath. ) 1 Inhaler 1  . ALPRAZolam (XANAX) 0.25 MG tablet Take 0.25 mg by mouth at bedtime as needed for anxiety.    Marland Kitchen aspirin 81 MG chewable tablet Chew 81 mg by mouth daily at 3 pm.     . budesonide (PULMICORT FLEXHALER) 180 MCG/ACT inhaler Inhale 2 puffs into the lungs 2 (two) times daily. 1 each 3  . budesonide (RHINOCORT AQUA) 32 MCG/ACT nasal spray 2 sprays in each nostril 1-2 times daily 5 mL 3  . calcium carbonate (TUMS - DOSED IN MG ELEMENTAL CALCIUM) 500 MG chewable tablet Chew 2-3 tablets by mouth daily as needed for indigestion or heartburn.    . diphenhydrAMINE (BENADRYL) 25 MG tablet Take 75 mg by mouth at bedtime.    . hydrOXYzine (ATARAX/VISTARIL) 25 MG tablet Take 1 tablet (25 mg total) by mouth every 6 (six) hours. 5 tablet 0  . loratadine (CLARITIN) 10 MG tablet Take 10 mg by mouth daily as needed for allergies.    . Prenatal Vit-Fe Fumarate-FA (PRENATAL PO) Take 1 tablet by mouth every evening.    . butalbital-acetaminophen-caffeine (FIORICET, ESGIC) 50-325-40 MG tablet Take 1 tablet by mouth every 4 (four) hours as needed for headache. (Patient not taking: Reported on 06/26/2017) 30 tablet 1  . budesonide-formoterol (SYMBICORT) 80-4.5 MCG/ACT inhaler Inhale 2 puffs into the lungs 2 (two) times daily. (Patient not taking: Reported on 06/26/2017) 1 Inhaler 3   No facility-administered medications prior to visit.      Allergies:   Penicillins and Sulfa antibiotics   Social History   Socioeconomic History  . Marital status: Single    Spouse name: None  . Number of children: 0  . Years of education: 77  . Highest education level: None  Social Needs  . Financial  resource strain: None  . Food insecurity - worry: None  . Food insecurity - inability: None  . Transportation needs - medical: None  . Transportation needs - non-medical: None  Occupational History  . Occupation: CMA    Comment: [employer]  Tobacco Use  . Smoking status: Former Smoker    Types: Cigarettes    Last attempt to quit: 10/08/2016    Years since quitting: 0.7  . Smokeless tobacco: Never Used  Substance and Sexual Activity  . Alcohol use: No  . Drug use: No  . Sexual activity: Yes    Birth control/protection: None  Other Topics Concern  . None  Social History Narrative   Lives at home w/ her fiance and new daughter.   Right-handed.   Drinks about 1-2 cups of caffeine per day.   Fun: Hiking, Zumba,  sleep   Denies religious beliefs effecting health care.   Denies abuse and feels safe at home.      Family History:  The patient's family history includes Alcohol abuse in her mother; COPD in her mother; Diabetes in her sister; Heart attack in her sister; Heart disease in her father, maternal uncle, mother, and sister; Hyperlipidemia in her father and mother; Hyperthyroidism in her mother; Lung cancer in her mother; Mental illness in her mother and sister; Pulmonary embolism in her father; Rheum arthritis in her maternal grandmother; Transient ischemic attack in her father.   ROS:   Please see the history of present illness.    ROS All other systems reviewed and are negative.   PHYSICAL EXAM:   VS:  BP (!) 142/86   Pulse 95   Ht 5\' 3"  (1.6 m)   Wt 230 lb (104.3 kg)   LMP 10/30/2016 (Approximate)   BMI 40.74 kg/m    GEN: Well nourished, well developed, in no acute distress  HEENT: normal  Neck: no JVD, carotid bruits, or masses Cardiac: RRR; no murmurs, rubs, or gallops,no edema  Respiratory:  Clear to auscultation bilaterally no wheezes GI: soft, nontender, consistent with 35 weeks of pregnancy, + BS MS: no deformity or atrophy  Skin: warm and dry, no rash Neuro:   Alert and Oriented x 3, Strength and sensation are intact Psych: euthymic mood, full affect  Wt Readings from Last 3 Encounters:  06/26/17 230 lb (104.3 kg)  06/23/17 213 lb (96.6 kg)  05/30/17 226 lb (102.5 kg)     Studies/Labs Reviewed:   EKG:  EKG is ordered today.  Normal sinus rhythm, 95 bpm, otherwise normal EKG unchanged from prior. Recent Labs: 08/31/2016: Brain Natriuretic Peptide 54.4 11/07/2016: TSH 2.870 05/30/2017: ALT 14; BUN <5; Creatinine, Ser 0.39; Hemoglobin 10.9; Platelets 220; Potassium 3.1; Sodium 135   Lipid Panel No results found for: CHOL, TRIG, HDL, CHOLHDL, VLDL, LDLCALC, LDLDIRECT  Additional studies/ records that were reviewed today include:   TTE: 09/07/2015 - Left ventricle: The cavity size was normal. Wall thickness was   normal. Systolic function was normal. The estimated ejection   fraction was in the range of 55% to 60%. Wall motion was normal;   there were no regional wall motion abnormalities. Doppler   parameters are consistent with abnormal left ventricular   relaxation (grade 1 diastolic dysfunction). The E/e&' ratio is   >15, suggesting elevated LV filling pressure. - Mitral valve: Mildly thickened leaflets . There was mild   regurgitation. - Left atrium: Moderately dilated at 41 ml/m2. - Tricuspid valve: There was trivial regurgitation. - Pulmonary arteries: PA peak pressure: 16 mm Hg (S). - Inferior vena cava: The vessel was normal in size. The   respirophasic diameter changes were in the normal range (>= 50%),   consistent with normal central venous pressure.  Impressions:  - LVEF 55-60%, normal wall thickness,normal wall motion, diastolic   dysfunction with elevated LV filling pressure, mild MR, moderate   LAE, trivial TR, normal RVSP.    ASSESSMENT:    No diagnosis found.   PLAN:  In order of problems listed above:  1. Preeclampsia - hypertension during surgery Mr. pregnancy, I will start labetalol 200 mg by mouth twice  a day she will keep [place] updated her blood pressures, goal less than 130/80, we'll uptitrate as needed. We'll see her in 2 weeks to monitor her symptoms. 2. Shortness of breath - echocardiogram that was completely normal with normal systolic diastolic  function. She has no signs of CHF, this is related to asthma and acute bronchitis, we will continue to monitor. 3. Asthma - followed by pulmonary, currently advised to use Symbicort that's the safest option during pregnancy.  Follow-up in 2 weeks.  Medication Adjustments/Labs and Tests Ordered: Current medicines are reviewed at length with the patient today.  Concerns regarding medicines are outlined above.  Medication changes, Labs and Tests ordered today are listed in the Patient Instructions below. There are no Patient Instructions on file for this visit.   Signed, Tobias AlexanderKatarina Jerremy Maione, MD  06/26/2017 11:20 AM    [facility] [address], [place], [place]  [address] Phone: [phone]; Fax: [phone]

## 2017-06-26 NOTE — Patient Instructions (Addendum)
Please take all new medication as prescribed - the low dose xanax as needed only  Please continue all other medications as before, and refills have been done if requested.  Please have the pharmacy call with any other refills you may need.  Please keep your appointments with your specialists as you may have planned  Please go to the LAB in the Basement (turn left off the elevator) for the tests to be done today  You will be contacted by phone if any changes need to be made immediately.  Otherwise, you will receive a letter about your results with an explanation, but please check with MyChart first.  Please remember to sign up for MyChart if you have not done so, as this will be important to you in the future with finding out test results, communicating by private email, and scheduling acute appointments online when needed.

## 2017-06-26 NOTE — Progress Notes (Signed)
Subjective:    Patient ID: Cynthia Townsend, female    DOB: [birth year], 37 y.o.   MRN: [id number]  HPI  Here to f/u; has plan to be induced first wk of Feb 2019 nearing end gravid.  Has new labetolol rx at the pharmacy, just has not been able to start yet.  [facility], authorized for double dose atarax but did not help for anxiety.  Pt states OB has mentioned low dose xanax may be ok due to risk of higher blood pressure without.  She is very nervous b/c getting closer to her second parturition since the first one in 2017 led to post birth pulm edema and eclampsia and post partum hemorrhage.  Just cant stop "obsessing" over her coming delivery, and a friend just had a still born child earlier this week.  Has had 3 panic attackes in 10 days, last one prior was years ago.  Denies worsening depressive symptoms, suicidal ideation.  Pt denies chest pain, wheezing, orthopnea, PND, increased LE swelling, palpitations, dizziness or syncope, though has had some dyspnea, hx of mild anemia.   Pt denies polydipsia, polyuria.  No fever and Denies urinary symptoms such as dysuria, frequency, urgency, flank pain, hematuria or n/v, fever, chills. Past Medical History:  Diagnosis Date  . ADD (attention deficit disorder)   . Anemia    post partum  . Anemia requiring transfusions    of iron  . Anxiety   . Asthma   . Chicken pox   . Classic migraine with aura 09/20/2015  . Depression   . Ectopic pregnancy   . Insomnia   . Laceration of finger    left index with nerve involvement  . Nasal polyps    denies  . Pneumonia   . Postpartum hemorrhage   . Pre-eclampsia in postpartum period   . Pregnancy induced hypertension   . Pulmonary edema   . Recurrent upper respiratory infection (URI)   . Sciatic pain   . Subclinical hypothyroidism   . Urticaria    dogs  . Vitamin D deficiency    Past Surgical History:  Procedure Laterality Date  . BREAST BIOPSY     benign  punch bx  . DILATION AND  EVACUATION N/A 09/04/2015   Procedure: DILATATION AND EVACUATION;  Surgeon: Mitchel Honour, DO;  Location: [facility];  Service: Gynecology;  Laterality: N/A;  . DNC    . I&D EXTREMITY Left 04/10/2017   Procedure: LEFT INDEX FINGER WOUND REPAIR AND NERVE REPAIR;  Surgeon: Bradly Bienenstock, MD;  Location: [facility];  Service: Orthopedics;  Laterality: Left;    reports that she quit smoking about 8 months ago. Her smoking use included cigarettes. she has never used smokeless tobacco. She reports that she does not drink alcohol or use drugs. family history includes Alcohol abuse in her mother; COPD in her mother; Diabetes in her sister; Heart attack in her sister; Heart disease in her father, maternal uncle, mother, and sister; Hyperlipidemia in her father and mother; Hyperthyroidism in her mother; Lung cancer in her mother; Mental illness in her mother and sister; Pulmonary embolism in her father; Rheum arthritis in her maternal grandmother; Transient ischemic attack in her father. Allergies  Allergen Reactions  . Penicillins Shortness Of Breath, Swelling, Rash and Other (See Comments)    Has patient had a PCN reaction causing immediate rash, facial/tongue/throat swelling, SOB or lightheadedness with hypotension: Yes Has patient had a PCN reaction causing severe rash involving mucus membranes or  skin necrosis: No Has patient had a PCN reaction that required hospitalization: No Has patient had a PCN reaction occurring within the last 10 years: No If all of the above answers are "NO", then may proceed with Cephalosporin use.    . Sulfa Antibiotics Hives and Rash   Current Outpatient Medications on File Prior to Visit  Medication Sig Dispense Refill  . acetaminophen (TYLENOL) 500 MG tablet Take 1,000 mg by mouth as needed for moderate pain or headache.     . albuterol (PROVENTIL HFA;VENTOLIN HFA) 108 (90 Base) MCG/ACT inhaler Inhale 2 puffs into the lungs every 6 (six) hours as needed for wheezing or shortness of  breath. (Patient taking differently: Inhale 2 puffs into the lungs every 4 (four) hours as needed for wheezing or shortness of breath. ) 1 Inhaler 1  . aspirin 81 MG chewable tablet Chew 81 mg by mouth daily at 3 pm.     . budesonide (PULMICORT FLEXHALER) 180 MCG/ACT inhaler Inhale 2 puffs into the lungs 2 (two) times daily. 1 each 3  . budesonide (RHINOCORT AQUA) 32 MCG/ACT nasal spray 2 sprays in each nostril 1-2 times daily 5 mL 3  . butalbital-acetaminophen-caffeine (FIORICET, ESGIC) 50-325-40 MG tablet Take 1 tablet by mouth every 4 (four) hours as needed for headache. 30 tablet 1  . calcium carbonate (TUMS - DOSED IN MG ELEMENTAL CALCIUM) 500 MG chewable tablet Chew 2-3 tablets by mouth daily as needed for indigestion or heartburn.    . diphenhydrAMINE (BENADRYL) 25 MG tablet Take 75 mg by mouth at bedtime.    . hydrOXYzine (ATARAX/VISTARIL) 25 MG tablet Take 1 tablet (25 mg total) by mouth every 6 (six) hours. 5 tablet 0  . labetalol (NORMODYNE) 200 MG tablet Take 1 tablet (200 mg total) by mouth 2 (two) times daily. 180 tablet 1  . loratadine (CLARITIN) 10 MG tablet Take 10 mg by mouth daily as needed for allergies.    . Prenatal Vit-Fe Fumarate-FA (PRENATAL PO) Take 1 tablet by mouth every evening.     No current facility-administered medications on file prior to visit.    Review of Systems  Constitutional: Negative for other unusual diaphoresis or sweats HENT: Negative for ear discharge or swelling Eyes: Negative for other worsening visual disturbances Respiratory: Negative for stridor or other swelling  Gastrointestinal: Negative for worsening distension or other blood Genitourinary: Negative for retention or other urinary change Musculoskeletal: Negative for other MSK pain or swelling Skin: Negative for color change or other new lesions Neurological: Negative for worsening tremors and other numbness  Psychiatric/Behavioral: Negative for worsening agitation or other fatigue All  other system neg per pt    Objective:   Physical Exam BP (!) 148/92   Pulse 99   Temp 97.6 F (36.4 C) (Oral)   Ht 5\' 3"  (1.6 m)   Wt 230 lb (104.3 kg)   LMP 10/30/2016 (Approximate)   SpO2 99%   BMI 40.74 kg/m  VS noted, not ill appearing Constitutional: Pt appears in NAD HENT: Head: NCAT.  Right Ear: External ear normal.  Left Ear: External ear normal.  Eyes: . Pupils are equal, round, and reactive to light. Conjunctivae and EOM are normal Nose: without d/c or deformity Neck: Neck supple. Gross normal ROM Cardiovascular: Normal rate and regular rhythm.   Pulmonary/Chest: Effort normal and breath sounds without rales or wheezing.  Abd:  Soft, NT, ND, + BS, no organomegaly, + gravid Neurological: Pt is alert. At baseline orientation, motor grossly intact Skin: Skin  is warm. No rashes, other new lesions, no LE edema Psychiatric: Pt behavior is normal without agitation but 2+ nervous No other exam findings    Assessment & Plan:

## 2017-06-28 NOTE — Assessment & Plan Note (Signed)
No overt bleeding, for f/u cbc 

## 2017-06-28 NOTE — Assessment & Plan Note (Signed)
Mild, for f/u. Lab today

## 2017-06-28 NOTE — Assessment & Plan Note (Signed)
Now worsening, ok for low dose xanax prn to use sparingly, declines referral to counseling or psychiatry

## 2017-06-28 NOTE — Assessment & Plan Note (Signed)
Mild, for f/u lab today 

## 2017-06-30 DIAGNOSIS — Z3A35 35 weeks gestation of pregnancy: Secondary | ICD-10-CM | POA: Diagnosis not present

## 2017-06-30 DIAGNOSIS — Z3480 Encounter for supervision of other normal pregnancy, unspecified trimester: Secondary | ICD-10-CM | POA: Diagnosis not present

## 2017-06-30 DIAGNOSIS — Z3400 Encounter for supervision of normal first pregnancy, unspecified trimester: Secondary | ICD-10-CM | POA: Diagnosis not present

## 2017-06-30 DIAGNOSIS — O9989 Other specified diseases and conditions complicating pregnancy, childbirth and the puerperium: Secondary | ICD-10-CM | POA: Diagnosis not present

## 2017-06-30 LAB — OB RESULTS CONSOLE GBS: GBS: NEGATIVE

## 2017-06-30 MED FILL — AZITHROMYCIN 250 MG TABLET: 250 | 5 days supply | Qty: 6 | Fill #0

## 2017-07-01 ENCOUNTER — Encounter (HOSPITAL_COMMUNITY): Payer: Self-pay | Admitting: *Deleted

## 2017-07-01 ENCOUNTER — Telehealth (HOSPITAL_COMMUNITY): Payer: Self-pay | Admitting: *Deleted

## 2017-07-01 NOTE — Telephone Encounter (Signed)
Preadmission screen  

## 2017-07-03 DIAGNOSIS — Z3493 Encounter for supervision of normal pregnancy, unspecified, third trimester: Secondary | ICD-10-CM | POA: Diagnosis not present

## 2017-07-03 DIAGNOSIS — Z3A36 36 weeks gestation of pregnancy: Secondary | ICD-10-CM | POA: Diagnosis not present

## 2017-07-03 DIAGNOSIS — O9989 Other specified diseases and conditions complicating pregnancy, childbirth and the puerperium: Secondary | ICD-10-CM | POA: Diagnosis not present

## 2017-07-07 DIAGNOSIS — Z3480 Encounter for supervision of other normal pregnancy, unspecified trimester: Secondary | ICD-10-CM | POA: Diagnosis not present

## 2017-07-07 DIAGNOSIS — Z3400 Encounter for supervision of normal first pregnancy, unspecified trimester: Secondary | ICD-10-CM | POA: Diagnosis not present

## 2017-07-07 DIAGNOSIS — Z3A36 36 weeks gestation of pregnancy: Secondary | ICD-10-CM | POA: Diagnosis not present

## 2017-07-07 DIAGNOSIS — M654 Radial styloid tenosynovitis [de Quervain]: Secondary | ICD-10-CM | POA: Diagnosis not present

## 2017-07-07 DIAGNOSIS — O9989 Other specified diseases and conditions complicating pregnancy, childbirth and the puerperium: Secondary | ICD-10-CM | POA: Diagnosis not present

## 2017-07-10 DIAGNOSIS — O368130 Decreased fetal movements, third trimester, not applicable or unspecified: Secondary | ICD-10-CM | POA: Diagnosis not present

## 2017-07-10 DIAGNOSIS — Z3A37 37 weeks gestation of pregnancy: Secondary | ICD-10-CM | POA: Diagnosis not present

## 2017-07-10 DIAGNOSIS — Z3400 Encounter for supervision of normal first pregnancy, unspecified trimester: Secondary | ICD-10-CM | POA: Diagnosis not present

## 2017-07-10 DIAGNOSIS — O368330 Maternal care for abnormalities of the fetal heart rate or rhythm, third trimester, not applicable or unspecified: Secondary | ICD-10-CM | POA: Diagnosis not present

## 2017-07-10 DIAGNOSIS — N760 Acute vaginitis: Secondary | ICD-10-CM | POA: Diagnosis not present

## 2017-07-14 ENCOUNTER — Inpatient Hospital Stay (HOSPITAL_COMMUNITY): Admission: AD | Admit: 2017-07-14 | Payer: 59 | Source: Ambulatory Visit | Admitting: Obstetrics & Gynecology

## 2017-07-14 ENCOUNTER — Ambulatory Visit: Payer: 59 | Admitting: Cardiology

## 2017-07-15 ENCOUNTER — Inpatient Hospital Stay (HOSPITAL_COMMUNITY)
Admission: RE | Admit: 2017-07-15 | Discharge: 2017-07-18 | DRG: 807 | Disposition: A | Discharge status: Home / Self Care | Payer: 59 | Source: Ambulatory Visit | Attending: Obstetrics & Gynecology | Admitting: Obstetrics & Gynecology

## 2017-07-15 ENCOUNTER — Inpatient Hospital Stay (HOSPITAL_COMMUNITY): Payer: 59 | Admitting: Anesthesiology

## 2017-07-15 ENCOUNTER — Encounter (HOSPITAL_COMMUNITY): Payer: Self-pay

## 2017-07-15 DIAGNOSIS — I10 Essential (primary) hypertension: Secondary | ICD-10-CM | POA: Diagnosis present

## 2017-07-15 DIAGNOSIS — O99214 Obesity complicating childbirth: Secondary | ICD-10-CM | POA: Diagnosis present

## 2017-07-15 DIAGNOSIS — Z3A37 37 weeks gestation of pregnancy: Secondary | ICD-10-CM

## 2017-07-15 DIAGNOSIS — O139 Gestational [pregnancy-induced] hypertension without significant proteinuria, unspecified trimester: Secondary | ICD-10-CM | POA: Diagnosis present

## 2017-07-15 DIAGNOSIS — O134 Gestational [pregnancy-induced] hypertension without significant proteinuria, complicating childbirth: Principal | ICD-10-CM | POA: Diagnosis present

## 2017-07-15 DIAGNOSIS — O99344 Other mental disorders complicating childbirth: Secondary | ICD-10-CM | POA: Diagnosis present

## 2017-07-15 DIAGNOSIS — O9952 Diseases of the respiratory system complicating childbirth: Secondary | ICD-10-CM | POA: Diagnosis present

## 2017-07-15 DIAGNOSIS — Z87891 Personal history of nicotine dependence: Secondary | ICD-10-CM | POA: Diagnosis not present

## 2017-07-15 DIAGNOSIS — J45909 Unspecified asthma, uncomplicated: Secondary | ICD-10-CM | POA: Diagnosis present

## 2017-07-15 DIAGNOSIS — F419 Anxiety disorder, unspecified: Secondary | ICD-10-CM | POA: Diagnosis present

## 2017-07-15 LAB — CBC
HCT: 32.5 % — ABNORMAL LOW (ref 36.0–46.0)
HCT: 33.1 % — ABNORMAL LOW (ref 36.0–46.0)
HEMOGLOBIN: 10.9 g/dL — AB (ref 12.0–15.0)
Hemoglobin: 11.2 g/dL — ABNORMAL LOW (ref 12.0–15.0)
MCH: 30.4 pg (ref 26.0–34.0)
MCH: 30.9 pg (ref 26.0–34.0)
MCHC: 33.5 g/dL (ref 30.0–36.0)
MCHC: 33.8 g/dL (ref 30.0–36.0)
MCV: 90.5 fL (ref 78.0–100.0)
MCV: 91.2 fL (ref 78.0–100.0)
PLATELETS: 207 10*3/uL (ref 150–400)
PLATELETS: 251 10*3/uL (ref 150–400)
RBC: 3.59 MIL/uL — AB (ref 3.87–5.11)
RBC: 3.63 MIL/uL — AB (ref 3.87–5.11)
RDW: 14.2 % (ref 11.5–15.5)
RDW: 14.3 % (ref 11.5–15.5)
WBC: 10.2 10*3/uL (ref 4.0–10.5)
WBC: 8.4 10*3/uL (ref 4.0–10.5)

## 2017-07-15 LAB — TYPE AND SCREEN
ABO/RH(D): A NEG
ANTIBODY SCREEN: NEGATIVE

## 2017-07-15 LAB — RPR: RPR Ser Ql: NONREACTIVE

## 2017-07-15 MED ORDER — ONDANSETRON HCL 4 MG/2ML IJ SOLN
4.0000 mg | Freq: Four times a day (QID) | INTRAMUSCULAR | Status: DC | PRN
  Filled 2017-07-15: qty 2

## 2017-07-15 MED ORDER — FENTANYL 2.5 MCG/ML BUPIVACAINE 1/10 % EPIDURAL INFUSION (WH - ANES)
14.0000 mL/h | INTRAMUSCULAR | Status: DC | PRN
  Administered 2017-07-15 (×2): 14 mL/h via EPIDURAL
  Filled 2017-07-15 (×2): qty 100

## 2017-07-15 MED ORDER — DIPHENHYDRAMINE HCL 50 MG/ML IJ SOLN
12.5000 mg | INTRAMUSCULAR | Status: DC | PRN
  Administered 2017-07-15: 12.5 mg via INTRAVENOUS
  Filled 2017-07-15: qty 1

## 2017-07-15 MED ORDER — OXYTOCIN 40 UNITS IN LACTATED RINGERS INFUSION - SIMPLE MED: 2.5000 [IU]/h | INTRAVENOUS | Status: DC

## 2017-07-15 MED ORDER — ZOLPIDEM TARTRATE 5 MG PO TABS: 5.0000 mg | ORAL_TABLET | Freq: Every evening | ORAL | Status: DC | PRN

## 2017-07-15 MED ORDER — OXYTOCIN 40 UNITS IN LACTATED RINGERS INFUSION - SIMPLE MED
1.0000 m[IU]/min | INTRAVENOUS | Status: DC
  Administered 2017-07-15: 2 m[IU]/min via INTRAVENOUS
  Filled 2017-07-15: qty 1000

## 2017-07-15 MED ORDER — PHENYLEPHRINE 40 MCG/ML (10ML) SYRINGE FOR IV PUSH (FOR BLOOD PRESSURE SUPPORT)
80.0000 ug | PREFILLED_SYRINGE | INTRAVENOUS | Status: DC | PRN
  Filled 2017-07-15: qty 5

## 2017-07-15 MED ORDER — MISOPROSTOL 25 MCG QUARTER TABLET
25.0000 ug | ORAL_TABLET | ORAL | Status: DC | PRN
  Administered 2017-07-15 (×2): 25 ug via VAGINAL
  Filled 2017-07-15 (×3): qty 1

## 2017-07-15 MED ORDER — LACTATED RINGERS IV SOLN: 500.0000 mL | Freq: Once | INTRAVENOUS | Status: DC

## 2017-07-15 MED ORDER — LACTATED RINGERS IV SOLN
500.0000 mL | INTRAVENOUS | Status: DC | PRN
Start: 2017-07-15 — End: 2017-07-16

## 2017-07-15 MED ORDER — PHENYLEPHRINE 40 MCG/ML (10ML) SYRINGE FOR IV PUSH (FOR BLOOD PRESSURE SUPPORT)
80.0000 ug | PREFILLED_SYRINGE | INTRAVENOUS | Status: DC | PRN
  Filled 2017-07-15: qty 5
  Filled 2017-07-15: qty 10

## 2017-07-15 MED ORDER — LIDOCAINE HCL (PF) 1 % IJ SOLN
INTRAMUSCULAR | Status: DC | PRN
  Administered 2017-07-15 (×2): 5 mL via EPIDURAL

## 2017-07-15 MED ORDER — LIDOCAINE HCL (PF) 1 % IJ SOLN
30.0000 mL | INTRAMUSCULAR | Status: AC | PRN
  Administered 2017-07-16: 30 mL via SUBCUTANEOUS
  Filled 2017-07-15: qty 30

## 2017-07-15 MED ORDER — SOD CITRATE-CITRIC ACID 500-334 MG/5ML PO SOLN: 30.0000 mL | ORAL | Status: DC | PRN

## 2017-07-15 MED ORDER — OXYCODONE-ACETAMINOPHEN 5-325 MG PO TABS: 1.0000 | ORAL_TABLET | ORAL | Status: DC | PRN

## 2017-07-15 MED ORDER — FENTANYL CITRATE (PF) 100 MCG/2ML IJ SOLN: 50.0000 ug | INTRAMUSCULAR | Status: DC | PRN

## 2017-07-15 MED ORDER — LACTATED RINGERS IV SOLN
INTRAVENOUS | Status: DC
  Administered 2017-07-15 (×3): via INTRAVENOUS

## 2017-07-15 MED ORDER — EPHEDRINE 5 MG/ML INJ
10.0000 mg | INTRAVENOUS | Status: DC | PRN
  Filled 2017-07-15: qty 2

## 2017-07-15 MED ORDER — OXYCODONE-ACETAMINOPHEN 5-325 MG PO TABS: 2.0000 | ORAL_TABLET | ORAL | Status: DC | PRN

## 2017-07-15 MED ORDER — TERBUTALINE SULFATE 1 MG/ML IJ SOLN
0.2500 mg | Freq: Once | INTRAMUSCULAR | Status: DC | PRN
  Filled 2017-07-15: qty 1

## 2017-07-15 MED ORDER — FLEET ENEMA 7-19 GM/118ML RE ENEM: 1.0000 | ENEMA | RECTAL | Status: DC | PRN

## 2017-07-15 MED ORDER — ACETAMINOPHEN 325 MG PO TABS: 650.0000 mg | ORAL_TABLET | ORAL | Status: DC | PRN

## 2017-07-15 MED ORDER — OXYTOCIN BOLUS FROM INFUSION
500.0000 mL | Freq: Once | INTRAVENOUS | Status: AC
  Administered 2017-07-16: 500 mL via INTRAVENOUS

## 2017-07-15 NOTE — Anesthesia Procedure Notes (Signed)
Epidural Patient location during procedure: OB Start time: 07/15/2017 2:34 PM End time: 07/15/2017 2:48 PM  Staffing Anesthesiologist: Heather RobertsSinger, Gram Siedlecki, MD Performed: anesthesiologist   Preanesthetic Checklist Completed: patient identified, site marked, pre-op evaluation, timeout performed, IV checked, risks and benefits discussed and monitors and equipment checked  Epidural Patient position: sitting Prep: DuraPrep Patient monitoring: heart rate, cardiac monitor, continuous pulse ox and blood pressure Approach: midline Location: L2-L3 Injection technique: LOR saline  Needle:  Needle type: Tuohy  Needle gauge: 17 G Needle length: 9 cm Needle insertion depth: 7 cm Catheter size: 20 Guage Catheter at skin depth: 12 cm Test dose: negative and Other  Assessment Events: blood not aspirated, injection not painful, no injection resistance and negative IV test  Additional Notes Informed consent obtained prior to proceeding including risk of failure, 1% risk of PDPH, risk of minor discomfort and bruising.  Discussed rare but serious complications including epidural abscess, permanent nerve injury, epidural hematoma.  Discussed alternatives to epidural analgesia and patient desires to proceed.  Timeout performed pre-procedure verifying patient name, procedure, and platelet count.  Patient tolerated procedure well.

## 2017-07-15 NOTE — H&P (Signed)
Cynthia Townsend is a 37 y.o. female presenting for IOL secondary to gestational hypertension.  She declines HA, CP/SOB, RUQ pain, and visual disturbance.  Patient has h/o pp Pre-eclampsia with pulmonary edema; daily baby ASA this preg.  The patient has anxiety disorder; no medications this pregnancy. The patient was diagnosed with "chronic pneumonia" since 03/2016.  Negative GBS.   OB History    Gravida Para Term Preterm AB Living   3 1 1  0 1 1   SAB TAB Ectopic Multiple Live Births   0 0 1 0 1     Past Medical History:  Diagnosis Date  . ADD (attention deficit disorder)   . Anemia    post partum  . Anemia requiring transfusions    of iron  . Anxiety   . Asthma   . Blood transfusion without reported diagnosis   . Carpal tunnel syndrome during pregnancy   . Chicken pox   . Classic migraine with aura 09/20/2015  . De Quervain's disease (radial styloid tenosynovitis)   . Depression   . Ectopic pregnancy   . Insomnia   . Laceration of finger    left index with nerve involvement  . Nasal polyps    denies  . Pneumonia   . Postpartum hemorrhage   . Pre-eclampsia in postpartum period   . Pregnancy induced hypertension   . Pulmonary edema   . Recurrent upper respiratory infection (URI)   . Sciatic pain   . Subclinical hypothyroidism    sub-clinical  . Urticaria    dogs  . Vitamin D deficiency    Past Surgical History:  Procedure Laterality Date  . BREAST BIOPSY     benign  punch bx  . DILATION AND CURETTAGE OF UTERUS    . DILATION AND EVACUATION N/A 09/04/2015   Procedure: DILATATION AND EVACUATION;  Surgeon: Mitchel Honour, DO;  Location: [facility];  Service: Gynecology;  Laterality: N/A;  . DNC    . I&D EXTREMITY Left 04/10/2017   Procedure: LEFT INDEX FINGER WOUND REPAIR AND NERVE REPAIR;  Surgeon: Bradly Bienenstock, MD;  Location: [facility];  Service: Orthopedics;  Laterality: Left;   Family History: family history includes Alcohol abuse in her mother; COPD in her mother; Colon cancer  in her paternal uncle; Diabetes in her sister; Heart attack in her father and sister; Heart disease in her father, maternal uncle, mother, and sister; Hyperlipidemia in her father and mother; Hyperthyroidism in her mother; Lung cancer in her mother; Mental illness in her mother and sister; Pulmonary embolism in her father; Rheum arthritis in her maternal grandmother; Transient ischemic attack in her father. Social History:  reports that she quit smoking about 9 months ago. Her smoking use included cigarettes. she has never used smokeless tobacco. She reports that she does not drink alcohol or use drugs.     Maternal Diabetes: No Genetic Screening: Normal Maternal Ultrasounds/Referrals: Normal Fetal Ultrasounds or other Referrals:  None Maternal Substance Abuse:  No Significant Maternal Medications:  None Significant Maternal Lab Results:  Lab values include: Group B Strep negative Other Comments:  None  ROS Maternal Medical History:  Contractions: Onset was 6-12 hours ago.   Frequency: irregular.   Perceived severity is mild.    Fetal activity: Perceived fetal activity is normal.   Last perceived fetal movement was within the past hour.    Prenatal complications: PIH.   Prenatal Complications - Diabetes: none.    Dilation: 1.5 Effacement (%): 70 Station: Ballotable Exam by:: Husna Krone Blood pressure  124/74, pulse 80, temperature 98.2 F (36.8 C), temperature source Oral, resp. rate 18, weight 230 lb 4.8 oz (104.5 kg), last menstrual period 10/30/2016, currently breastfeeding. Maternal Exam:  Uterine Assessment: Contraction strength is mild.  Contraction frequency is irregular.   Abdomen: Patient reports no abdominal tenderness. Fundal height is c/w dates.   Estimated fetal weight is 8#.   Fetal presentation: vertex  Introitus: Normal vulva.   Physical Exam  Constitutional: She is oriented to person, place, and time. She appears well-developed and well-nourished.  GI: Soft.  There is no rebound and no guarding.  Neurological: She is alert and oriented to person, place, and time.  Skin: Skin is warm and dry.  Psychiatric: She has a normal mood and affect. Her behavior is normal.    Prenatal labs: ABO, Rh: --/--/A NEG (02/05 0042) Antibody: NEG (02/05 0042) Rubella: Immune (07/24 0000) RPR: Nonreactive (07/24 0000)  HBsAg: Negative (07/24 0000)  HIV: Non-reactive (07/24 0000)  GBS: Negative (01/21 0000)   Assessment/Plan: 36yo G 3P1011 at 4980w5d for IOL -GHTN-Add magnesium sulfate for persistent severes -Start pitocin 2/2 -Epidural if desired -Anticipate NSVD  Cynthia Townsend 07/15/2017, 9:20 AM

## 2017-07-15 NOTE — Anesthesia Pain Management Evaluation Note (Signed)
  [facility] Pain Management Visit Note  Patient: Cynthia ChuteKeasha Ellenwood, 37 y.o., female  "Hello I am a member of the anesthesia team at [facility]. We have an anesthesia team available at all times to provide care throughout the hospital, including epidural management and anesthesia for C-section. I don't know your plan for the delivery whether it a natural birth, water birth, IV sedation, nitrous supplementation, doula or epidural, but we want to meet your pain goals."   1.Was your pain managed to your expectations on prior hospitalizations?   Yes   2.What is your expectation for pain management during this hospitalization?     Epidural  3.How can we help you reach that goal? epidural  Record the patient's initial score and the patient's pain goal.   Pain: 1  Pain Goal: 6 [facility] wants you to be able to say your pain was always managed very well.  Rica RecordsICKELTON,Caedence Snowden 07/15/2017

## 2017-07-15 NOTE — Anesthesia Preprocedure Evaluation (Signed)
Anesthesia Evaluation  Patient identified by MRN, date of birth, ID band Patient awake    Reviewed: Allergy & Precautions, NPO status , Patient's Chart, lab work & pertinent test results  History of Anesthesia Complications Negative for: history of anesthetic complications  Airway Mallampati: II  TM Distance: >3 FB Neck ROM: Full    Dental no notable dental hx. (+) Dental Advisory Given   Pulmonary neg pulmonary ROS, asthma , former smoker,    Pulmonary exam normal        Cardiovascular hypertension, Normal cardiovascular exam  Study Conclusions  - Left ventricle: The cavity size was normal. Systolic function was normal. The estimated ejection fraction was in the range of 60% to 65%. Wall motion was normal; there were no regional wall motion abnormalities.      Neuro/Psych PSYCHIATRIC DISORDERS Anxiety Depression negative neurological ROS  negative psych ROS   GI/Hepatic negative GI ROS, Neg liver ROS,   Endo/Other  Hypothyroidism Morbid obesity  Renal/GU negative Renal ROS  negative genitourinary   Musculoskeletal negative musculoskeletal ROS (+)   Abdominal   Peds negative pediatric ROS (+)  Hematology negative hematology ROS (+)   Anesthesia Other Findings   Reproductive/Obstetrics negative OB ROS                             Anesthesia Physical  Anesthesia Plan  ASA: III  Anesthesia Plan: Epidural   Post-op Pain Management:    Induction:   PONV Risk Score and Plan:   Airway Management Planned: Natural Airway  Additional Equipment:   Intra-op Plan:   Post-operative Plan:   Informed Consent: I have reviewed the patients History and Physical, chart, labs and discussed the procedure including the risks, benefits and alternatives for the proposed anesthesia with the patient or authorized representative who has indicated his/her understanding and acceptance.   Dental  advisory given  Plan Discussed with: Anesthesiologist  Anesthesia Plan Comments:         Anesthesia Quick Evaluation

## 2017-07-16 ENCOUNTER — Encounter (HOSPITAL_COMMUNITY): Payer: Self-pay

## 2017-07-16 DIAGNOSIS — O139 Gestational [pregnancy-induced] hypertension without significant proteinuria, unspecified trimester: Secondary | ICD-10-CM | POA: Diagnosis present

## 2017-07-16 LAB — COMPREHENSIVE METABOLIC PANEL
ALK PHOS: 179 U/L — AB (ref 38–126)
ALT: 15 U/L (ref 14–54)
ANION GAP: 8 (ref 5–15)
AST: 17 U/L (ref 15–41)
Albumin: 2.7 g/dL — ABNORMAL LOW (ref 3.5–5.0)
BILIRUBIN TOTAL: 0.4 mg/dL (ref 0.3–1.2)
BUN: 9 mg/dL (ref 6–20)
CALCIUM: 8.7 mg/dL — AB (ref 8.9–10.3)
CO2: 20 mmol/L — ABNORMAL LOW (ref 22–32)
Chloride: 104 mmol/L (ref 101–111)
Creatinine, Ser: 0.37 mg/dL — ABNORMAL LOW (ref 0.44–1.00)
GFR calc Af Amer: >60 mL/min (ref >60)
GFR calc non Af Amer: >60 mL/min (ref >60)
Glucose, Bld: 95 mg/dL (ref 65–99)
Potassium: 3.9 mmol/L (ref 3.5–5.1)
Sodium: 132 mmol/L — ABNORMAL LOW (ref 135–145)
TOTAL PROTEIN: 6.5 g/dL (ref 6.5–8.1)

## 2017-07-16 LAB — CBC
HCT: 31.3 % — ABNORMAL LOW (ref 36.0–46.0)
HEMOGLOBIN: 10.6 g/dL — AB (ref 12.0–15.0)
MCH: 30.5 pg (ref 26.0–34.0)
MCHC: 33.9 g/dL (ref 30.0–36.0)
MCV: 90.2 fL (ref 78.0–100.0)
PLATELETS: 212 10*3/uL (ref 150–400)
RBC: 3.47 MIL/uL — ABNORMAL LOW (ref 3.87–5.11)
RDW: 14.2 % (ref 11.5–15.5)
WBC: 11.6 10*3/uL — ABNORMAL HIGH (ref 4.0–10.5)

## 2017-07-16 MED ORDER — TETANUS-DIPHTH-ACELL PERTUSSIS 5-2.5-18.5 LF-MCG/0.5 IM SUSP: 0.5000 mL | Freq: Once | INTRAMUSCULAR | Status: DC

## 2017-07-16 MED ORDER — SENNOSIDES-DOCUSATE SODIUM 8.6-50 MG PO TABS
2.0000 | ORAL_TABLET | ORAL | Status: DC
  Administered 2017-07-16: 2 via ORAL
  Filled 2017-07-16: qty 2

## 2017-07-16 MED ORDER — COCONUT OIL OIL
1.0000 "application " | TOPICAL_OIL | Status: DC | PRN
  Filled 2017-07-16: qty 120

## 2017-07-16 MED ORDER — ALBUTEROL SULFATE (2.5 MG/3ML) 0.083% IN NEBU: 3.0000 mL | INHALATION_SOLUTION | Freq: Four times a day (QID) | RESPIRATORY_TRACT | Status: DC | PRN

## 2017-07-16 MED ORDER — OXYCODONE-ACETAMINOPHEN 5-325 MG PO TABS: 2.0000 | ORAL_TABLET | ORAL | Status: DC | PRN

## 2017-07-16 MED ORDER — IBUPROFEN 600 MG PO TABS
600.0000 mg | ORAL_TABLET | Freq: Four times a day (QID) | ORAL | Status: DC
  Administered 2017-07-16 – 2017-07-18 (×11): 600 mg via ORAL
  Filled 2017-07-16 (×10): qty 1

## 2017-07-16 MED ORDER — OXYCODONE-ACETAMINOPHEN 5-325 MG PO TABS
1.0000 | ORAL_TABLET | ORAL | Status: DC | PRN
  Administered 2017-07-16 – 2017-07-18 (×8): 1 via ORAL
  Filled 2017-07-16 (×8): qty 1

## 2017-07-16 MED ORDER — ACETAMINOPHEN 325 MG PO TABS: 650.0000 mg | ORAL_TABLET | ORAL | Status: DC | PRN

## 2017-07-16 MED ORDER — PRENATAL MULTIVITAMIN CH
1.0000 | ORAL_TABLET | Freq: Every day | ORAL | Status: DC
  Administered 2017-07-16 – 2017-07-18 (×3): 1 via ORAL
  Filled 2017-07-16 (×3): qty 1

## 2017-07-16 MED ORDER — DIBUCAINE 1 % RE OINT: 1.0000 "application " | TOPICAL_OINTMENT | RECTAL | Status: DC | PRN

## 2017-07-16 MED ORDER — ONDANSETRON HCL 4 MG PO TABS
4.0000 mg | ORAL_TABLET | ORAL | Status: DC | PRN
  Administered 2017-07-16: 4 mg via ORAL
  Filled 2017-07-16: qty 1

## 2017-07-16 MED ORDER — SIMETHICONE 80 MG PO CHEW: 80.0000 mg | CHEWABLE_TABLET | ORAL | Status: DC | PRN

## 2017-07-16 MED ORDER — ZOLPIDEM TARTRATE 5 MG PO TABS: 5.0000 mg | ORAL_TABLET | Freq: Every evening | ORAL | Status: DC | PRN

## 2017-07-16 MED ORDER — BENZOCAINE-MENTHOL 20-0.5 % EX AERO
1.0000 "application " | INHALATION_SPRAY | CUTANEOUS | Status: DC | PRN
  Administered 2017-07-16 – 2017-07-18 (×2): 1 via TOPICAL
  Filled 2017-07-16 (×2): qty 56

## 2017-07-16 MED ORDER — ONDANSETRON HCL 4 MG/2ML IJ SOLN: 4.0000 mg | INTRAMUSCULAR | Status: DC | PRN

## 2017-07-16 MED ORDER — DIPHENHYDRAMINE HCL 25 MG PO CAPS: 25.0000 mg | ORAL_CAPSULE | Freq: Four times a day (QID) | ORAL | Status: DC | PRN

## 2017-07-16 MED ORDER — WITCH HAZEL-GLYCERIN EX PADS: 1.0000 "application " | MEDICATED_PAD | CUTANEOUS | Status: DC | PRN

## 2017-07-16 NOTE — Lactation Note (Signed)
This note was copied from a baby's chart. Lactation Consultation Note  Patient Name: Cynthia Shana ChuteKeasha Pare WUJWJ'XToday's Date: 07/16/2017 Reason for consult: Initial assessment;Early term 37-38.6wks Breastfeeding consultation services and support information given to mom.  This is her second baby and she breastfed her first baby for 6 months.  Newborn is 9114 hours old and has been sleepy today.  RN assisted mom recently with hand expression and spoon fed 5 mls to baby.  Mom skin to skin with baby.  Instructed to feed with feeding cues and if unable to obtain a good feeding hand express and spoon feed.  Encouraged to call for latch assist prn.  Maternal Data Has patient been taught Hand Expression?: Yes Does the patient have breastfeeding experience prior to this delivery?: Yes  Feeding Feeding Type: Breast Milk  LATCH Score                   Interventions    Lactation Tools Discussed/Used     Consult Status Consult Status: Follow-up Date: 07/17/17 Follow-up type: In-patient    [name], Petrina Melby S 07/16/2017, 3:03 PM

## 2017-07-16 NOTE — Anesthesia Postprocedure Evaluation (Signed)
Anesthesia Post Note  Patient: [name]  Procedure(s) Performed: AN AD HOC LABOR EPIDURAL     Patient location during evaluation: Mother Baby Anesthesia Type: Epidural Level of consciousness: awake Pain management: pain level controlled Vital Signs Assessment: post-procedure vital signs reviewed and stable Respiratory status: spontaneous breathing Cardiovascular status: stable Postop Assessment: no headache, no backache, epidural receding, patient able to bend at knees, no apparent nausea or vomiting and adequate PO intake Anesthetic complications: no    Last Vitals:  Vitals:   07/16/17 0259 07/16/17 0353  BP: 138/65 126/68  Pulse: 98 86  Resp: 18 18  Temp: 37.1 C 37.2 C  SpO2: 98%     Last Pain:  Vitals:   07/16/17 0548  TempSrc:   PainSc: 0-No pain   Pain Goal:                 [name]

## 2017-07-16 NOTE — Progress Notes (Addendum)
Post Partum Day 0 Subjective: no complaints, up ad lib, voiding and tolerating PO  Objective: Blood pressure 102/60, pulse 70, temperature 97.9 F (36.6 C), temperature source Oral, resp. rate 19, weight 104.5 kg (230 lb 4.8 oz), last menstrual period 10/30/2016, SpO2 98 %, unknown if currently breastfeeding.  Physical Exam:  General: alert, cooperative and appears stated age Lochia: appropriate Uterine Fundus: firm Incision: healing well, no significant drainage, no dehiscence, no significant erythema DVT Evaluation: No evidence of DVT seen on physical exam. Negative Homan's sign. No cords or calf tenderness. No significant calf/ankle edema.  Recent Labs    07/15/17 1340 07/16/17 0549  HGB 10.9* 10.6*  HCT 32.5* 31.3*    Assessment/Plan: Breastfeeding and Circumcision prior to discharge. Risks discussed including infxn, bleeding, damage to penis and poor cosmesis. Patient consents. Willa wait peds to see baby.  H/o PIH - Bps wnl since delivery. H/o pp PIH w/pulmary edema - ctm closely.    LOS: 1 day   Ranae Pilalise Jennifer Nyelle Wolfson 07/16/2017, 9:56 AM   Update: baby still not ready for circ - has not voided per NICU. EL

## 2017-07-17 ENCOUNTER — Encounter (HOSPITAL_COMMUNITY): Payer: Self-pay

## 2017-07-17 MED ORDER — BISACODYL 10 MG RE SUPP
10.0000 mg | Freq: Once | RECTAL | Status: AC
  Administered 2017-07-17: 10 mg via RECTAL
  Filled 2017-07-17: qty 1

## 2017-07-17 MED ORDER — ALPRAZOLAM 0.5 MG PO TABS
0.5000 mg | ORAL_TABLET | Freq: Once | ORAL | Status: AC
  Administered 2017-07-17: 0.5 mg via ORAL
  Filled 2017-07-17: qty 1

## 2017-07-17 MED ORDER — POLYETHYLENE GLYCOL 3350 17 G PO PACK
17.0000 g | PACK | Freq: Every day | ORAL | Status: DC
  Administered 2017-07-17 – 2017-07-18 (×2): 17 g via ORAL
  Filled 2017-07-17 (×3): qty 1

## 2017-07-17 MED ORDER — SERTRALINE HCL 50 MG PO TABS
50.0000 mg | ORAL_TABLET | Freq: Every day | ORAL | Status: DC
  Administered 2017-07-17 – 2017-07-18 (×2): 50 mg via ORAL
  Filled 2017-07-17 (×3): qty 1

## 2017-07-17 NOTE — Lactation Note (Signed)
This note was copied from a baby'Townsend chart. Lactation Consultation Note  Patient Name: Cynthia Townsend UEAVW'UToday'Townsend Date: 07/17/2017  Mom states baby is latching easily and feeding well.  Baby is currently in the nursery for circumcision.  Discussed possible sleepiness this afternoon.  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  Encouraged to call for assist/concerns prn.   Maternal Data    Feeding Feeding Type: Breast Fed  LATCH Score                   Interventions    Lactation Tools Discussed/Used     Consult Status      [name], Cynthia Townsend 07/17/2017, 2:19 PM

## 2017-07-17 NOTE — Progress Notes (Signed)
Patient calls nurse, "on verge of panic attack" due to increased gas pains and not able to have a bowel movement. Calming techniques given also other interventions done (prune juice, increased ambulation, etc.) without relief. Dr. Renaldo FiddlerAdkins contacted for suppository and miralax. Will continue to monitor. Newman PiesWiggins, Lionell Matuszak T, RN

## 2017-07-17 NOTE — Progress Notes (Signed)
Subjective: Postpartum Day 1: Cesarean Delivery Patient reports + flatus and no problems voiding. Pt reports increase in anxiety over last 3-5 weeks.  Previous pregnancy c/b gestational HTN and PP hemorrhage requiring D&C.  Bleeding stable currently.   Objective: Vital signs in last 24 hours: Temp:  [98 F (36.7 C)] 98 F (36.7 C) (02/06 1528) Pulse Rate:  [65-79] 65 (02/07 0552) Resp:  [18] 18 (02/06 1528) BP: (115-123)/(56-68) 123/56 (02/07 0552)  Physical Exam:  General: alert and cooperative Lochia: appropriate Uterine Fundus: firm DVT Evaluation: No evidence of DVT seen on physical exam.  Recent Labs    07/15/17 1340 07/16/17 0549  HGB 10.9* 10.6*  HCT 32.5* 31.3*    Assessment/Plan: Status post Cesarean section. Doing well postoperatively.  Continue current care. Desires circ - r/b/a discussed, informed consent Anxiety - start zoloft  Cynthia Townsend 07/17/2017, 9:21 AM

## 2017-07-17 NOTE — Progress Notes (Signed)
Dr. Renaldo FiddlerAdkins contacted for patients panic and anxiety. She is unable to calm herself down and baby is kind of making her anxious with crying. She feels unable to soothe him because of her constipation and gas pains. Patient is walking around room and does not want any doors closed, feels hot and clammy. RN reassured her that everything is fine and we can work through it together. Baby taken to nursery a moment to help relief some stress on the patient.  Order for Xanax received by Dr. Renaldo FiddlerAdkins. Newman PiesWiggins, Cynthia Mable T, RN

## 2017-07-18 LAB — COMPREHENSIVE METABOLIC PANEL
ALK PHOS: 152 U/L — AB (ref 38–126)
ALT: 26 U/L (ref 14–54)
ANION GAP: 8 (ref 5–15)
AST: 25 U/L (ref 15–41)
Albumin: 3 g/dL — ABNORMAL LOW (ref 3.5–5.0)
BILIRUBIN TOTAL: 0.3 mg/dL (ref 0.3–1.2)
BUN: 12 mg/dL (ref 6–20)
CALCIUM: 8.7 mg/dL — AB (ref 8.9–10.3)
CO2: 22 mmol/L (ref 22–32)
Chloride: 105 mmol/L (ref 101–111)
Creatinine, Ser: 0.37 mg/dL — ABNORMAL LOW (ref 0.44–1.00)
GFR calc Af Amer: >60 mL/min (ref >60)
Glucose, Bld: 95 mg/dL (ref 65–99)
POTASSIUM: 3.8 mmol/L (ref 3.5–5.1)
Sodium: 135 mmol/L (ref 135–145)
TOTAL PROTEIN: 6.7 g/dL (ref 6.5–8.1)

## 2017-07-18 LAB — CBC
HCT: 31.7 % — ABNORMAL LOW (ref 36.0–46.0)
HEMOGLOBIN: 10.8 g/dL — AB (ref 12.0–15.0)
MCH: 31 pg (ref 26.0–34.0)
MCHC: 34.1 g/dL (ref 30.0–36.0)
MCV: 91.1 fL (ref 78.0–100.0)
Platelets: 243 10*3/uL (ref 150–400)
RBC: 3.48 MIL/uL — ABNORMAL LOW (ref 3.87–5.11)
RDW: 13.8 % (ref 11.5–15.5)
WBC: 7.6 10*3/uL (ref 4.0–10.5)

## 2017-07-18 MED ORDER — SERTRALINE HCL 50 MG PO TABS: 50.0000 mg | ORAL_TABLET | Freq: Every day | ORAL | 6 refills | Status: DC

## 2017-07-18 MED ORDER — LABETALOL HCL 200 MG PO TABS
200.0000 mg | ORAL_TABLET | Freq: Once | ORAL | Status: AC
  Administered 2017-07-18: 200 mg via ORAL
  Filled 2017-07-18: qty 1

## 2017-07-18 NOTE — Discharge Summary (Signed)
Obstetric Discharge Summary Reason for Admission: induction of labor Prenatal Procedures: none Intrapartum Procedures: spontaneous vaginal delivery Postpartum Procedures: none Complications-Operative and Postpartum: 2 degree perineal laceration Hemoglobin  Date Value Ref Range Status  07/16/2017 10.6 (L) 12.0 - 15.0 g/dL Final  16/10/960411/19/2018 54.011.1 11.1 - 15.9 g/dL Final   HCT  Date Value Ref Range Status  07/16/2017 31.3 (L) 36.0 - 46.0 % Final   Hematocrit  Date Value Ref Range Status  04/28/2017 32.2 (L) 34.0 - 46.6 % Final    Physical Exam:  General: alert, cooperative, appears stated age and no distress Lochia: appropriate Uterine Fundus: firm Incision: healing well DVT Evaluation: No evidence of DVT seen on physical exam.  Discharge Diagnoses: Term Pregnancy-delivered  Discharge Information: Date: 07/18/2017 Activity: pelvic rest Diet: routine Medications: zoloft Condition: stable Instructions: refer to practice specific booklet Discharge to: home   Newborn Data: Live born female  Birth Weight: 6 lb 13.2 oz (3096 g) APGAR: 8, 9  Newborn Delivery   Birth date/time:  07/16/2017 00:44:00 Delivery type:  Vaginal, Spontaneous     Home with mother.  Nakeesha Bowler C 07/18/2017, 9:11 AM

## 2017-07-18 NOTE — Progress Notes (Signed)
Patient states she had 2 large BM. Feeling better.

## 2017-07-18 NOTE — Progress Notes (Signed)
Around 1330, pt called RN to room to say she had a headache and was still concerned her BP was elevated.  Pt in bathroom when RN arrived to room and BP was gotten after she sat on the bed.  BP 163/95.  Except for headache, pt otherwise asymptomatic.  Pt had a panic attack right after BP checked.  Once calmed, pt was asked to lay and rest for 10 minutes.  Repeat BP was 133/80.  Dr. Rana SnareLowe notified and said to have her follow up at the office on Monday.

## 2017-07-18 NOTE — Progress Notes (Signed)
Verbal order by Dr. Rana SnareLowe to discharge patient home tonight.

## 2017-07-18 NOTE — Progress Notes (Signed)
Pt woke up from a nap and decided to take her blood pressure.  It was in the 150s/90s for her.  RN rechecked and got 150/77.  Pt reports a headache 5/10 that is like a band across her hairline, denies visual disturbances, pt denies upper right abdominal pain, reflexes normal and no clonus noted on exam.  Medication given for headache and back pain.  Pt reports she was taking Labetalol 200mg  BID prior to admission but has not taken it since the morning of 07/15/17.  Dr. Rana SnareLowe notified and ordered a CBC, CMET, and one time oral dose of Labetalol 200mg  to be given now.   Status of d/c will depend on results.

## 2017-07-18 NOTE — Lactation Note (Signed)
This note was copied from a baby's chart. Lactation Consultation Note  Patient Name: Cynthia Shana ChuteKeasha David ZOXWR'UToday's Date: 07/18/2017 Reason for consult: Follow-up assessment Observed mom latch baby without difficulty.  Baby obtained good depth after bottom lip untucked.  Good nutritive suck/swallows.  Reviewed breast massage with mom.  Breasts are filling. Instructed to continue to feed with cues and keep feeding diary the first week home.  Lactation outpatient services and support reviewed and encouraged prn.  Maternal Data    Feeding Feeding Type: Breast Fed  LATCH Score Latch: Grasps breast easily, tongue down, lips flanged, rhythmical sucking.  Audible Swallowing: Spontaneous and intermittent  Type of Nipple: Everted at rest and after stimulation  Comfort (Breast/Nipple): Soft / non-tender  Hold (Positioning): No assistance needed to correctly position infant at breast.  LATCH Score: 10  Interventions    Lactation Tools Discussed/Used     Consult Status Consult Status: Complete    [name], Alydia Gosser S 07/18/2017, 11:50 AM

## 2017-07-18 NOTE — Clinical Social Work Maternal (Signed)
CLINICAL SOCIAL WORK MATERNAL/CHILD NOTE  Patient Details  Name: Zuha Dejonge MRN: [id number] Date of Birth: 03/30/1981  Date:  07/18/2017  Clinical Social Worker Initiating Note:  Terri Piedra, Loop Date/Time: Initiated:  07/18/17/1000     Child's Name:  Jaci Standard   Biological Parents:  Mother, Father(Murial Rendall and Loetta Rough)   Need for Interpreter:  None   Reason for Referral:  Behavioral Health Concerns   Address:  [address]  [place] [address]    Phone number:  [phone] (home)     Additional phone number:   Household Members/Support Persons (HM/SP):   Household Member/Support Person 1, Household Member/Support Person 2   HM/SP Name Relationship DOB or Age  HM/SP -1 Aaron Edelman Haffler FOB/Fiance    HM/SP -2 Minette Brine Haffler daughter 09/01/15  HM/SP -3        HM/SP -4        HM/SP -5        HM/SP -6        HM/SP -7        HM/SP -8          Natural Supports (not living in the home):  Friends, Extended Family(MOB reports that her fiances family and a few close friends are her greatest supports in addition to FOB.)   Professional Supports: None(MOB is interested in counseling)   Employment: (MOB has just resigned from her position because 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  Strengths:  Ability to meet basic needs , Compliance with medical plan , Home prepared for child , Understanding of illness, Psychotropic Medications, Pediatrician chosen   Psychotropic Medications:  Zoloft, Xanax      Pediatrician:    [place]  Pediatrician List:   [facility]      Pediatrician Fax Number:    Risk Factors/Current Problems:  Mental Health Concerns    Cognitive State:  Able to Concentrate , Alert , Goal Oriented , Insightful , Linear Thinking , Racing Thoughts    Mood/Affect:  Anxious , Calm , Interested , Tearful    CSW Assessment: CSW met with MOB to offer support and complete assessment due to hx of anxiety and [place] Postnatal Depression Scale score of 11 during this hospitalization.  MOB was extremely pleasant and receptive to CSW's visit and states, "I think I need help with my emotions."  She seemed relieved that CSW was there to speak with her. She was sitting in bed with baby asleep next to her when CSW arrived.  She placed baby in the bassinet and came over to the chair to speak with CSW.  She was able to give her full attention to the assessment and baby slept through the entire 45 minute conversation.  MOB has awareness of her mental health concerns and a desire to seek treatment.  She notes "I don't want to live like this," in regards to the anxiety she is experiencing.  She denies SI/HI and was able to contract for safety.  She notes  her children as her motivation to improve her mental health status.  She informed CSW that "I don't remember life without anxiety," and explained that she first started experiencing depressive symptoms around age 54.  She states she saw a psychiatrist at that time who put her on approximately 11 different medications in a span of one year.  She states she had a "mental breakdown" leading to a behavioral hospital admission at [facility] to "detox" from all of these medications and has been only taking Xanax as needed since that time.  (approximately 17 years).  She states she was diagnosed with Bipolar disorder by the psychiatrist at age 44, but in talking with her today, it does sound more like she has suffered from Depression, Anxiety, and ADHD.  She has been started on  Zoloft yesterday and wonders if that caused her to have the panic attack she experienced last night.  We discussed this at length and CSW validated her feelings, which seemed to help ease her anxiety.  She explained that she has had 3 panic attacks in her whole life until the last month, in which she has had 6 panic attacks, including the one last night.  She explained that her friend experienced an IUFD and she was extremely anxious about her own impending labor and delivery.  She states she thought her symptoms would resolve after having a healthy baby and was surprised when she continued to feel panic yesterday/last night.  She also informed CSW that after her first delivery, she told the medical team that she was having various symptoms that she was continually told were "normal" and then had to be hospitalized with postpartum pre-eclampsia, and postpartum hemorrhage, requiring a blood transfusion and D&C.  She started to feel that she was not being heard yesterday regarding her inability to have a bowel movement.  She catastrophized this to the point that she thought she would end up dying here in the hospital because no one was listening to her.  She states staff did listen to her and provided her with interventions in order for her to have a bowel movement.  CSW helped her see that her hx and current situation make sense in leading to a panic attack.  CSW commends her for stating her concerns and making sure that she advocated for her physical needs.  CSW also normalized ongoing anxiety related to her friend's IUFD and the fact that emotions regarding this situation may not resolve with the birth of a healthy baby, but may develop into a form of survivors guilt in the fact that her baby is alive when her friend's is not.  She cried and stated that she couldn't go to the balloon release for her friend because of the guilt she felt over her own successful pregnancy.  CSW normalized, validated, and encouraged  her to see her situation as separate from her friend's.  CSW commends MOB for her ability to acknowledge her feelings and process them with CSW.  CSW recommends ongoing counseling to deal with these emotions.  She is in agreement and CSW provided her with various resources depending on what insurance she will have, since she states her current insurance will end at the end of this month.  CSW also recommends an evaluation by a psychiatrist to ensure that she is on the right medication and spoke about how an SSRI, like Zoloft, will take 4-6 weeks to reach a therapeutic level in the blood stream.  Therefore, she needs to give it  time before determining if she feels it is working.  We discussed the differences in anti-anxiety medications such as a benzodiazepines vs an antidepressant such as SSRIs.  CSW does not think that the one dose of Zoloft contributed to the panic attack and explained this to MOB.  She states that her MD today also said this to her and that it makes sense.  She was grateful for the mental health resources and plans to follow up.  She also mentioned that in the past when she has experienced a panic attack, she would talk with her mother, who also experienced mental health concerns and knew how to "talk me through it."  She states her fiance it a "great dad and very supportive, but he doesn't understand my anxiety."  CSW suggests that it is not his responsibility to be able to manage her anxiety and provided her with a Mobile Crisis hotline to call if she feels she needs someone to talk to in a crisis.  She stated immense appreciation.  CSW also feels she is experiencing grief over the loss of her mother, whom she acknowledges was there for her at the birth of her first child, and not at the birth of this baby.  CSW discussed positive coping mechanisms with MOB.  She identifies going outside for fresh air (which she did not feel she could do while in the hospital last night) and deep breathing as  things that help her calm down.  CSW suggests music, journaling and art therapy, such as coloring as additional ways to relax.  MOB states she used to find art very therapeutic and thinks she may benefit from this again.  We spoke about how she can incorporate this into her daily life with a toddler and newborn.  MOB states that she feels much better after talking with CSW and feels like she has a plan to get through this.  CSW also spoke about the hormonal shift after having a baby, baby blues time period, PMADs, monitoring with the use of the [place] Postnatal Depression Scale and New Mom Checklist and the Mom Talk Support Group held at [place] feels although MOB is at high risk for PMADs, it is positive that she is aware of her symptoms and eager to seek assistance in increasing her coping skills and processing her emotions.  MOB thanked CSW for the visit and commits to following up with a mental health provider in the community upon discharge.  She would like to speak with a financial counselor about applying for Medicaid prior to discharge.  CSW contacted N. McCraw to come speak with MOB.    CSW Plan/Description:  No Further Intervention Required/No Barriers to Discharge, Perinatal Mood and Anxiety Disorder (PMADs) Education, Other Information/Referral to [facility], [place], [place] 07/18/2017, 1:12 PM

## 2017-07-21 ENCOUNTER — Telehealth: Payer: Self-pay | Admitting: *Deleted

## 2017-07-21 MED FILL — SERTRALINE HCL 100 MG TAB: 100 | 30 days supply | Qty: 30 | Fill #0 | Status: TO

## 2017-07-21 NOTE — Telephone Encounter (Signed)
Thank you :)

## 2017-07-21 NOTE — Telephone Encounter (Signed)
Pt called in to schedule a follow-up appt with Dr Delton SeeNelson this week.  Pt was advised by her OBGYN to schedule an appt with Dr Delton SeeNelson this week for post-partum follow-up HTN, and increase in labetalol to 400 mg po BID.  Pt presented to her OBGYN for 1 week post-partum follow-up with complaints of increased BP and lower extremity edema.  Pt had STAT labs done at her OBGYN, and was told if her labs came back abnormal, she would be directly admitted, but otherwise, she needs to follow-up with Dr Delton SeeNelson this week.  Scheduled the pt a follow-up appt with Dr Delton SeeNelson for tomorrow 07/22/17 at 1:40 pm. Advised the pt to arrive 15 mins prior to this appt. Pt verbalized understanding and agrees with this plan.  Will forward this message to Dr Delton SeeNelson as an Lorain ChildesFYI.

## 2017-07-22 ENCOUNTER — Ambulatory Visit (INDEPENDENT_AMBULATORY_CARE_PROVIDER_SITE_OTHER): Payer: 59 | Admitting: Cardiology

## 2017-07-22 ENCOUNTER — Encounter: Payer: Self-pay | Admitting: Cardiology

## 2017-07-22 VITALS — BP 170/110 | HR 92 | Ht 63.0 in | Wt 216.0 lb

## 2017-07-22 DIAGNOSIS — O133 Gestational [pregnancy-induced] hypertension without significant proteinuria, third trimester: Secondary | ICD-10-CM

## 2017-07-22 DIAGNOSIS — O165 Unspecified maternal hypertension, complicating the puerperium: Secondary | ICD-10-CM | POA: Diagnosis not present

## 2017-07-22 MED ORDER — AMLODIPINE BESYLATE 2.5 MG PO TABS: 2.5000 mg | ORAL_TABLET | Freq: Every day | ORAL | 2 refills | Status: DC

## 2017-07-22 MED ORDER — FUROSEMIDE 40 MG PO TABS: 40.0000 mg | ORAL_TABLET | Freq: Every day | ORAL | 1 refills | Status: DC

## 2017-07-22 MED FILL — FUROSEMIDE 40 MG TAB: 40 | 90 days supply | Qty: 90 | Fill #0

## 2017-07-22 MED FILL — AMLODIPINE BESYLATE 2.5 MG: 2.5 | 90 days supply | Qty: 90 | Fill #0

## 2017-07-22 NOTE — Progress Notes (Signed)
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 The patient had first problem after she delivered her baby last year when she was diagnosed with postpartum preeclampsia. She was admitted with acute pulmonary edema and diuresed. She was doing okay since then with mild lower extremity edema. Her problem started again in October of this year when she presented with fever and cough and was prescribed azithromycin for acute bronchitis this was followed by 3 similar episodes and she was prescribed Levaquin 3 times. On 08/21/2016 she was diagnosed with strep throat and was treated with Keflex. This last Saturday she went to the urgent care with significant shortness of breath and worsening lower extremity edema inability to lay flat and was given a prescription for doxycycline and prednisone for acute bronchitis with bronchospasm.  03/13/2017 - this is 6 months follow-up, the patient is currently pregnant [redacted] weeks, she is feeling well, however since I saw her last time she had to episodes of pneumonia requiring antibiotic treatment. The patient is suspicious that mold in her rented house is causing some of the symptoms. She had pleuritic type of chest pain while she was diagnosed with pneumonia otherwise no exertional chest pain, she gets short of breath on moderate exertion and doesn't have any lower extremity edema no orthopnea paroxysmal nocturnal dyspnea. She is using Symbicort to maintain her asthma under control currently stable. The only concern right now is nasal congestion and wants advice on appropriate antihistaminics during pregnancy. She is scheduled to see an allergist. She gets very occasional palpitations  not associated with any other symptoms such as dizziness or chest pain no syncope.  06/26/2017 - 3 months follow-up, she is now [redacted] weeks pregnant, she is scheduled for induction first week of February because of hypertension and preeclampsia. She feels significantly short of breath, no lower extremity edema, she is dealing with acute bronchitis for which she was prescribed Keflex. She denies any fever or chills. She has received the flu shot. No orthopnea or paroxysmal nocturnal dyspnea. She has been dealing with significant anxiety when she wakes up frequently at night with panic attacks and shortness of breath. No chest pain.  07/22/2017 Cynthia Townsend delivered a healthy baby boy [name] on 07/16/2017 via vaginal delivery, her labetalol was discontinued in the hospital, and within 2 days she will headaches, shortness of breath, dyspnea on exertion, and significant lower extremity edema. She was restarted on labetalol 200 mg by mouth twice a day dose increased to 400 twice a day yesterday. Blood pressure continues to be in 170 range. Her labs were obtained yesterday physician for maintenance but results are unknown. She continues to have headache, shortness of breath, no orthopnea paroxysmal nocturnal dyspnea. Her weight has decreased from 230-->215 lbs since the delivery.  Past Medical History:  Diagnosis Date  . ADD (attention deficit disorder)   . Anemia    post partum  . Anemia requiring transfusions    of iron  . Anxiety   . Asthma   . Blood transfusion without reported diagnosis   . Carpal tunnel syndrome during pregnancy   . Chicken pox   . Classic migraine with aura  09/20/2015  . De Quervain's disease (radial styloid tenosynovitis)   . Depression   . Ectopic pregnancy   . Insomnia   . Laceration of finger    left index with nerve involvement  . Nasal polyps    denies  . Pneumonia   . Postpartum hemorrhage   . Pre-eclampsia in postpartum period   . Pregnancy induced hypertension   .  Pulmonary edema   . Recurrent upper respiratory infection (URI)   . Sciatic pain   . Subclinical hypothyroidism    sub-clinical  . Urticaria    dogs  . Vitamin D deficiency     Past Surgical History:  Procedure Laterality Date  . BREAST BIOPSY     benign  punch bx  . DILATION AND CURETTAGE OF UTERUS    . DILATION AND EVACUATION N/A 09/04/2015   Procedure: DILATATION AND EVACUATION;  Surgeon: Mitchel Honour, DO;  Location: [facility];  Service: Gynecology;  Laterality: N/A;  . DNC    . I&D EXTREMITY Left 04/10/2017   Procedure: LEFT INDEX FINGER WOUND REPAIR AND NERVE REPAIR;  Surgeon: Bradly Bienenstock, MD;  Location: [facility] OR;  Service: Orthopedics;  Laterality: Left;    Current Medications: Outpatient Medications Prior to Visit  Medication Sig Dispense Refill  . acetaminophen (TYLENOL) 500 MG tablet Take 1,000 mg by mouth as needed for moderate pain or headache.     . albuterol (PROVENTIL HFA;VENTOLIN HFA) 108 (90 Base) MCG/ACT inhaler Inhale 2 puffs into the lungs every 6 (six) hours as needed for wheezing or shortness of breath. (Patient taking differently: Inhale 2 puffs into the lungs every 4 (four) hours as needed for wheezing or shortness of breath. ) 1 Inhaler 1  . ALPRAZolam (XANAX) 0.25 MG tablet Take 1 tablet (0.25 mg total) by mouth 2 (two) times daily as needed for anxiety. 20 tablet 0  . butalbital-acetaminophen-caffeine (FIORICET, ESGIC) 50-325-40 MG tablet Take 1 tablet by mouth every 4 (four) hours as needed for headache. 30 tablet 1  . diphenhydrAMINE (BENADRYL) 25 MG tablet Take 75 mg by mouth at bedtime as needed for sleep.     Marland Kitchen labetalol (NORMODYNE) 200 MG tablet Take 400 mg by mouth 2 (two) times daily.    Marland Kitchen loratadine (CLARITIN) 10 MG tablet Take 10 mg by mouth daily as needed for allergies.    . Prenatal Vit-Fe Fumarate-FA (PRENATAL PO) Take 1 tablet by mouth every evening.    . sertraline (ZOLOFT) 100 MG tablet daily.    . budesonide (RHINOCORT AQUA) 32 MCG/ACT nasal  spray 2 sprays in each nostril 1-2 times daily 5 mL 3  . budesonide (PULMICORT FLEXHALER) 180 MCG/ACT inhaler Inhale 2 puffs into the lungs 2 (two) times daily. 1 each 3  . calcium carbonate (TUMS - DOSED IN MG ELEMENTAL CALCIUM) 500 MG chewable tablet Chew 2-3 tablets by mouth daily as needed for indigestion or heartburn.    . hydrOXYzine (ATARAX/VISTARIL) 25 MG tablet Take 1 tablet (25 mg total) by mouth every 6 (six) hours. 5 tablet 0  . labetalol (NORMODYNE) 200 MG tablet Take 1 tablet (200 mg total) by mouth 2 (two) times daily. 180 tablet 1  . sertraline (ZOLOFT) 50 MG tablet Take 1 tablet (50 mg total) by mouth daily. 30 tablet 6   No facility-administered medications prior to visit.      Allergies:   Penicillins and Sulfa antibiotics   Social History   Socioeconomic History  . Marital status: Single    Spouse name: None  .  Number of children: 0  . Years of education: 42  . Highest education level: None  Social Needs  . Financial resource strain: None  . Food insecurity - worry: None  . Food insecurity - inability: None  . Transportation needs - medical: None  . Transportation needs - non-medical: None  Occupational History  . Occupation: CMA    Comment: [employer]  Tobacco Use  . Smoking status: Former Smoker    Types: Cigarettes    Last attempt to quit: 10/08/2016    Years since quitting: 0.7  . Smokeless tobacco: Never Used  Substance and Sexual Activity  . Alcohol use: No  . Drug use: No  . Sexual activity: Yes    Birth control/protection: None  Other Topics Concern  . None  Social History Narrative   Lives at home w/ her fiance and new daughter.   Right-handed.   Drinks about 1-2 cups of caffeine per day.   Fun: Hiking, Zumba, sleep   Denies religious beliefs effecting health care.   Denies abuse and feels safe at home.      Family History:  The patient's family history includes Alcohol abuse in her mother; COPD in her mother; Colon cancer in her paternal  uncle; Diabetes in her sister; Heart attack in her father and sister; Heart disease in her father, maternal uncle, mother, and sister; Hyperlipidemia in her father and mother; Hyperthyroidism in her mother; Lung cancer in her mother; Mental illness in her mother and sister; Pulmonary embolism in her father; Rheum arthritis in her maternal grandmother; Transient ischemic attack in her father.   ROS:   Please see the history of present illness.    ROS All other systems reviewed and are negative.   PHYSICAL EXAM:   VS:  BP (!) 170/110 (BP Location: Left Arm, Patient Position: Sitting, Cuff Size: Normal)   Pulse 92   Ht 5\' 3"  (1.6 m)   Wt 216 lb (98 kg)   SpO2 97%   BMI 38.26 kg/m    GEN: Well nourished, well developed, in no acute distress  HEENT: normal  Neck: no JVD, carotid bruits, or masses Cardiac: RRR; no murmurs, rubs, or gallops, +2 LE edema  Respiratory:  Clear to auscultation bilaterally no wheezes GI: soft, nontender,+ BS MS: no deformity or atrophy  Skin: warm and dry, no rash Neuro:  Alert and Oriented x 3, Strength and sensation are intact Psych: euthymic mood, full affect  Wt Readings from Last 3 Encounters:  07/22/17 216 lb (98 kg)  07/15/17 230 lb 4.8 oz (104.5 kg)  06/26/17 230 lb (104.3 kg)    Studies/Labs Reviewed:   EKG:  EKG is ordered today.  Normal sinus rhythm, 95 bpm, otherwise normal EKG unchanged from prior. Recent Labs: 08/31/2016: Brain Natriuretic Peptide 54.4 11/07/2016: TSH 2.870 07/18/2017: ALT 26; BUN 12; Creatinine, Ser 0.37; Hemoglobin 10.8; Platelets 243; Potassium 3.8; Sodium 135   Lipid Panel No results found for: CHOL, TRIG, HDL, CHOLHDL, VLDL, LDLCALC, LDLDIRECT  Additional studies/ records that were reviewed today include:   TTE: 09/07/2015 - Left ventricle: The cavity size was normal. Wall thickness was   normal. Systolic function was normal. The estimated ejection   fraction was in the range of 55% to 60%. Wall motion was normal;    there were no regional wall motion abnormalities. Doppler   parameters are consistent with abnormal left ventricular   relaxation (grade 1 diastolic dysfunction). The E/e&' ratio is   >15, suggesting elevated LV filling  pressure. - Mitral valve: Mildly thickened leaflets . There was mild   regurgitation. - Left atrium: Moderately dilated at 41 ml/m2. - Tricuspid valve: There was trivial regurgitation. - Pulmonary arteries: PA peak pressure: 16 mm Hg (S). - Inferior vena cava: The vessel was normal in size. The   respirophasic diameter changes were in the normal range (>= 50%),   consistent with normal central venous pressure.  Impressions:  - LVEF 55-60%, normal wall thickness,normal wall motion, diastolic   dysfunction with elevated LV filling pressure, mild MR, moderate   LAE, trivial TR, normal RVSP.  EKG performed today 07/22/2017 shows normal sinus rhythm normal EKG unchanged from prior.  ASSESSMENT:    1. Pregnancy induced hypertension, third trimester   2. Hypertension, postpartum condition or complication      PLAN:  In order of problems listed above:  1. Preeclampsia - hypertension during pregnancy, s/p healthy vaginal delivery on 07/16/17 - healthy boy Fraser Dinreston, she started to have DOE and LE edema starting 07/18/17, significant LE edema, no orthopnea, PND, no chest pain. I will starts Lasix 40 mg daily. 2. We will start amlodipine 2.5 mg po daily, d/c labetalol, we will follow in 1 week.  3. Obtain labs from [facility], obtain urinalysis. 4. Asthma - followed by pulmonary, currently advised to use Symbicort that's the safest option during pregnancy.  Follow-up in 1week.  Medication Adjustments/Labs and Tests Ordered: Current medicines are reviewed at length with the patient today.  Concerns regarding medicines are outlined above.  Medication changes, Labs and Tests ordered today are listed in the Patient Instructions below. There are no Patient Instructions  on file for this visit.   Signed, Tobias AlexanderKatarina Alaynah Schutter, MD  07/22/2017 2:33 PM    [facility] [address], [place], [place]  [address] Phone: [phone]; Fax: [phone]

## 2017-07-22 NOTE — Patient Instructions (Addendum)
Medication Instructions:   STOP TAKING LABETALOL NOW   START TAKING LASIX 40 MG ONCE DAILY---START TAKING TODAY PER DR NELSON  START TAKING AMLODIPINE 2.5 MG ONCE DAILY    Labwork:  TODAY---URINALYSIS    Your physician has requested that you have an echocardiogram. Echocardiography is a painless test that uses sound waves to create images of your heart. It provides your doctor with information about the size and shape of your heart and how well your heart's chambers and valves are working. This procedure takes approximately one hour. There are no restrictions for this procedure.     Follow-Up:  ON THURSDAY 07/31/17 AT DR NELSON'S 4:20 PM SLOT--ADD PER DR Delton SeeNELSON       If you need a refill on your cardiac medications before your next appointment, please call your pharmacy.

## 2017-07-23 LAB — URINALYSIS
Bilirubin, UA: NEGATIVE
Glucose, UA: NEGATIVE
Ketones, UA: NEGATIVE
Nitrite, UA: NEGATIVE
Protein, UA: NEGATIVE
Specific Gravity, UA: 1.016 (ref 1.005–1.030)
Urobilinogen, Ur: 0.2 mg/dL (ref 0.2–1.0)
pH, UA: 5.5 (ref 5.0–7.5)

## 2017-07-25 ENCOUNTER — Telehealth: Payer: Self-pay | Admitting: *Deleted

## 2017-07-25 ENCOUNTER — Other Ambulatory Visit: Payer: Self-pay

## 2017-07-25 ENCOUNTER — Ambulatory Visit (HOSPITAL_COMMUNITY): Payer: 59 | Attending: Cardiology

## 2017-07-25 DIAGNOSIS — O133 Gestational [pregnancy-induced] hypertension without significant proteinuria, third trimester: Secondary | ICD-10-CM | POA: Diagnosis not present

## 2017-07-25 DIAGNOSIS — J45909 Unspecified asthma, uncomplicated: Secondary | ICD-10-CM | POA: Diagnosis not present

## 2017-07-25 DIAGNOSIS — O165 Unspecified maternal hypertension, complicating the puerperium: Secondary | ICD-10-CM | POA: Insufficient documentation

## 2017-07-25 DIAGNOSIS — Z8249 Family history of ischemic heart disease and other diseases of the circulatory system: Secondary | ICD-10-CM | POA: Insufficient documentation

## 2017-07-25 DIAGNOSIS — Z87891 Personal history of nicotine dependence: Secondary | ICD-10-CM | POA: Diagnosis not present

## 2017-07-25 DIAGNOSIS — O99519 Diseases of the respiratory system complicating pregnancy, unspecified trimester: Secondary | ICD-10-CM | POA: Insufficient documentation

## 2017-07-25 DIAGNOSIS — Z3A00 Weeks of gestation of pregnancy not specified: Secondary | ICD-10-CM | POA: Insufficient documentation

## 2017-07-25 MED ORDER — AMLODIPINE BESYLATE 5 MG PO TABS: 5.0000 mg | ORAL_TABLET | Freq: Every day | ORAL | 0 refills | Status: DC

## 2017-07-25 NOTE — Telephone Encounter (Signed)
Following up with the pt as advised by Dr Delton SeeNelson, to ask how her BP and LEE is doing.  Pt states her BP has improved but systolic at its lowest is 150.  Pts lower extremity swelling has much improved, and gone.  Informed the pt that Dr Delton SeeNelson has provided verbal orders for her to continue her lasix, and increase her amlodipine to 5 mg po daily.  Confirmed the pharmacy of choice with the pt.  Advised the pt to continue monitoring her BP and mychart these values to us.  Pt verbalized understanding and agrees with this plan.

## 2017-07-29 ENCOUNTER — Encounter: Payer: Self-pay | Admitting: Cardiology

## 2017-07-30 ENCOUNTER — Encounter: Payer: Self-pay | Admitting: Cardiology

## 2017-07-30 ENCOUNTER — Ambulatory Visit (INDEPENDENT_AMBULATORY_CARE_PROVIDER_SITE_OTHER): Payer: 59 | Admitting: Cardiology

## 2017-07-30 VITALS — BP 112/80 | HR 81 | Ht 63.0 in | Wt 207.8 lb

## 2017-07-30 DIAGNOSIS — O133 Gestational [pregnancy-induced] hypertension without significant proteinuria, third trimester: Secondary | ICD-10-CM

## 2017-07-30 DIAGNOSIS — O165 Unspecified maternal hypertension, complicating the puerperium: Secondary | ICD-10-CM | POA: Diagnosis not present

## 2017-07-30 DIAGNOSIS — R600 Localized edema: Secondary | ICD-10-CM

## 2017-07-30 MED ORDER — FUROSEMIDE 40 MG PO TABS: 40.0000 mg | ORAL_TABLET | ORAL | 3 refills | Status: DC | PRN

## 2017-07-30 NOTE — Progress Notes (Signed)
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 The patient had first problem after she delivered her baby last year when she was diagnosed with postpartum preeclampsia. She was admitted with acute pulmonary edema and diuresed. She was doing okay since then with mild lower extremity edema. Her problem started again in October of this year when she presented with fever and cough and was prescribed azithromycin for acute bronchitis this was followed by 3 similar episodes and she was prescribed Levaquin 3 times. On 08/21/2016 she was diagnosed with strep throat and was treated with Keflex. This last Saturday she went to the urgent care with significant shortness of breath and worsening lower extremity edema inability to lay flat and was given a prescription for doxycycline and prednisone for acute bronchitis with bronchospasm.  03/13/2017 - this is 6 months follow-up, the patient is currently pregnant [redacted] weeks, she is feeling well, however since I saw her last time she had to episodes of pneumonia requiring antibiotic treatment. The patient is suspicious that mold in her rented house is causing some of the symptoms. She had pleuritic type of chest pain while she was diagnosed with pneumonia otherwise no exertional chest pain, she gets short of breath on moderate exertion and doesn't have any lower extremity edema no orthopnea paroxysmal nocturnal dyspnea. She is using Symbicort to maintain her asthma under control currently stable. The only concern right now is nasal congestion and wants advice on appropriate antihistaminics during pregnancy. She is scheduled to see an allergist. She gets very occasional palpitations  not associated with any other symptoms such as dizziness or chest pain no syncope.  06/26/2017 - 3 months follow-up, she is now [redacted] weeks pregnant, she is scheduled for induction first week of February because of hypertension and preeclampsia. She feels significantly short of breath, no lower extremity edema, she is dealing with acute bronchitis for which she was prescribed Keflex. She denies any fever or chills. She has received the flu shot. No orthopnea or paroxysmal nocturnal dyspnea. She has been dealing with significant anxiety when she wakes up frequently at night with panic attacks and shortness of breath. No chest pain.  07/22/2017 Cynthia Townsend delivered a healthy baby boy Cynthia Townsend on 07/16/2017 via vaginal delivery, her labetalol was discontinued in the hospital, and within 2 days she will headaches, shortness of breath, dyspnea on exertion, and significant lower extremity edema. She was restarted on labetalol 200 mg by mouth twice a day dose increased to 400 twice a day yesterday. Blood pressure continues to be in 170 range. Her labs were obtained yesterday physician for maintenance but results are unknown. She continues to have headache, shortness of breath, no orthopnea paroxysmal nocturnal dyspnea. Her weight has decreased from 230-->215 lbs since the delivery.  07/30/2017, she is coming after 1 week, a week ago she was started on amlodipine 2.5 mg daily that was later increased to 5 mg daily as her blood pressure is still uncontrolled. Her blood pressure is now well controlled. She continues to have headaches but they're milder, we have also started Lasix 40 mg daily and her lower extremity edema has completely resolved, she denies  any orthopnea or proximal nocturnal dyspnea. Her breathing has significantly improved. She has lost 9 pounds since last week. She still has plenty milk for nursing.  Past Medical History:  Diagnosis Date  . ADD (attention deficit disorder)   . Anemia    post partum    . Anemia requiring transfusions    of iron  . Anxiety   . Asthma   . Blood transfusion without reported diagnosis   . Carpal tunnel syndrome during pregnancy   . Chicken pox   . Classic migraine with aura 09/20/2015  . De Quervain's disease (radial styloid tenosynovitis)   . Depression   . Ectopic pregnancy   . Insomnia   . Laceration of finger    left index with nerve involvement  . Nasal polyps    denies  . Pneumonia   . Postpartum hemorrhage   . Pre-eclampsia in postpartum period   . Pregnancy induced hypertension   . Pulmonary edema   . Recurrent upper respiratory infection (URI)   . Sciatic pain   . Subclinical hypothyroidism    sub-clinical  . Urticaria    dogs  . Vitamin D deficiency     Past Surgical History:  Procedure Laterality Date  . BREAST BIOPSY     benign  punch bx  . DILATION AND CURETTAGE OF UTERUS    . DILATION AND EVACUATION N/A 09/04/2015   Procedure: DILATATION AND EVACUATION;  Surgeon: Mitchel Honour, DO;  Location: [facility];  Service: Gynecology;  Laterality: N/A;  . DNC    . I&D EXTREMITY Left 04/10/2017   Procedure: LEFT INDEX FINGER WOUND REPAIR AND NERVE REPAIR;  Surgeon: Bradly Bienenstock, MD;  Location: [facility];  Service: Orthopedics;  Laterality: Left;    Current Medications: Outpatient Medications Prior to Visit  Medication Sig Dispense Refill  . acetaminophen (TYLENOL) 500 MG tablet Take 1,000 mg by mouth as needed for moderate pain or headache.     . albuterol (PROVENTIL HFA;VENTOLIN HFA) 108 (90 Base) MCG/ACT inhaler Inhale 2 puffs into the lungs every 6 (six) hours as needed for wheezing or shortness of breath. (Patient taking differently: Inhale 2 puffs into the lungs every 4 (four) hours as needed for wheezing or shortness of breath. ) 1 Inhaler 1  . ALPRAZolam (XANAX) 0.25 MG tablet Take 1 tablet (0.25 mg total) by mouth 2 (two) times daily as needed for anxiety. 20 tablet 0  . amLODipine (NORVASC) 5 MG tablet Take 1 tablet (5 mg total) by  mouth daily. 90 tablet 0  . butalbital-acetaminophen-caffeine (FIORICET, ESGIC) 50-325-40 MG tablet Take 1 tablet by mouth every 4 (four) hours as needed for headache. 30 tablet 1  . diphenhydrAMINE (BENADRYL) 25 MG tablet Take 75 mg by mouth at bedtime as needed for sleep.     Marland Kitchen loratadine (CLARITIN) 10 MG tablet Take 10 mg by mouth daily as needed for allergies.    . Prenatal Vit-Fe Fumarate-FA (PRENATAL PO) Take 1 tablet by mouth every evening.    . sertraline (ZOLOFT) 100 MG tablet daily.    . furosemide (LASIX) 40 MG tablet Take 1 tablet (40 mg total) by mouth daily. 90 tablet 1   No facility-administered medications prior to visit.      Allergies:   Penicillins and Sulfa antibiotics   Social History   Socioeconomic History  . Marital status: Single    Spouse name: None  . Number of children: 0  . Years of education: 16  . Highest education level: None  Social Needs  . Financial resource strain: None  . Food insecurity - worry: None  . Food insecurity - inability: None  . Transportation needs - medical: None  . Transportation needs - non-medical: None  Occupational History  . Occupation: CMA    Comment: [employer]  Tobacco Use  . Smoking status: Former Smoker    Types: Cigarettes    Last attempt to quit: 10/08/2016    Years since quitting: 0.8  . Smokeless tobacco: Never Used  Substance and Sexual Activity  . Alcohol use: No  . Drug use: No  . Sexual activity: Yes    Birth control/protection: None  Other Topics Concern  . None  Social History Narrative   Lives at home w/ her fiance and new daughter.   Right-handed.   Drinks about 1-2 cups of caffeine per day.   Fun: Hiking, Zumba, sleep   Denies religious beliefs effecting health care.   Denies abuse and feels safe at home.      Family History:  The patient's family history includes Alcohol abuse in her mother; COPD in her mother; Colon cancer in her paternal uncle; Diabetes in her sister; Heart attack in her  father and sister; Heart disease in her father, maternal uncle, mother, and sister; Hyperlipidemia in her father and mother; Hyperthyroidism in her mother; Lung cancer in her mother; Mental illness in her mother and sister; Pulmonary embolism in her father; Rheum arthritis in her maternal grandmother; Transient ischemic attack in her father.   ROS:   Please see the history of present illness.    ROS All other systems reviewed and are negative.   PHYSICAL EXAM:   VS:  BP 112/80   Pulse 81   Ht 5\' 3"  (1.6 m)   Wt 207 lb 12.8 oz (94.3 kg)   SpO2 98%   BMI 36.81 kg/m    GEN: Well nourished, well developed, in no acute distress  HEENT: normal  Neck: no JVD, carotid bruits, or masses Cardiac: RRR; no murmurs, rubs, or gallops, +2 LE edema  Respiratory:  Clear to auscultation bilaterally no wheezes GI: soft, nontender,+ BS MS: no deformity or atrophy  Skin: warm and dry, no rash Neuro:  Alert and Oriented x 3, Strength and sensation are intact Psych: euthymic mood, full affect  Wt Readings from Last 3 Encounters:  07/30/17 207 lb 12.8 oz (94.3 kg)  07/22/17 216 lb (98 kg)  07/15/17 230 lb 4.8 oz (104.5 kg)    Studies/Labs Reviewed:   EKG:  EKG is ordered today.  Normal sinus rhythm, 95 bpm, otherwise normal EKG unchanged from prior. Recent Labs: 08/31/2016: Brain Natriuretic Peptide 54.4 11/07/2016: TSH 2.870 07/18/2017: ALT 26; BUN 12; Creatinine, Ser 0.37; Hemoglobin 10.8; Platelets 243; Potassium 3.8; Sodium 135   Lipid Panel No results found for: CHOL, TRIG, HDL, CHOLHDL, VLDL, LDLCALC, LDLDIRECT  Additional studies/ records that were reviewed today include:   TTE: 09/07/2015 - Left ventricle: The cavity size was normal. Wall thickness was   normal. Systolic function was normal. The estimated ejection   fraction was in the range of 55% to 60%. Wall motion was normal;   there were no regional wall motion abnormalities. Doppler   parameters are consistent with abnormal left  ventricular   relaxation (grade 1 diastolic dysfunction). The E/e&' ratio is   >15, suggesting elevated LV filling pressure. - Mitral valve: Mildly thickened leaflets . There was mild   regurgitation. - Left atrium: Moderately dilated at 41 ml/m2. - Tricuspid  valve: There was trivial regurgitation. - Pulmonary arteries: PA peak pressure: 16 mm Hg (S). - Inferior vena cava: The vessel was normal in size. The   respirophasic diameter changes were in the normal range (>= 50%),   consistent with normal central venous pressure.  Impressions:  - LVEF 55-60%, normal wall thickness,normal wall motion, diastolic   dysfunction with elevated LV filling pressure, mild MR, moderate   LAE, trivial TR, normal RVSP.  EKG performed today 07/22/2017 shows normal sinus rhythm normal EKG unchanged from prior.  ASSESSMENT:    1. Pregnancy induced hypertension, third trimester   2. Hypertension, postpartum condition or complication      PLAN:  In order of problems listed above:  1. Hypertension during and post pregnancy, s/p healthy vaginal delivery on 07/16/17 - healthy boy Cynthia Townsend, she started to have DOE and LE edema starting 07/18/17, significant LE edema, no orthopnea, PND, no chest pain. Lasix 40 mg daily completely resolved swelling, she is -9 pounds in one week, will discontinue Lasix and only continue as needed.. 2. Continue amlodipine 5 mg po daily, d/c labetalol, she will let us know if her blood pressure continues to go down and we will again taper down the dose. 3. Be obtained labs from [facility] and obtained urinalysis in our office, and she has no signs of preeclampsia.Marland Kitchen 4. Asthma - followed by pulmonary, currently advised to use Symbicort that's the safest option during pregnancy.  Follow-up in 2 months.  Medication Adjustments/Labs and Tests Ordered: Current medicines are reviewed at length with the patient today.  Concerns regarding medicines are outlined above.  Medication  changes, Labs and Tests ordered today are listed in the Patient Instructions below. Patient Instructions  Medication Instructions:  Your physician has recommended you make the following change in your medication:   TAKE lasix AS NEEDED  Labwork: None ordered  Testing/Procedures: None ordered  Follow-Up: Your physician wants you to follow-up in 2 months with Dr. Donovan Kail [place] back to schedule   Any Other Special Instructions Will Be Listed Below (If Applicable).     If you need a refill on your cardiac medications before your next appointment, please call your pharmacy.      Signed, Cynthia Alexander, MD  07/30/2017 3:19 PM    [facility] [address] [place], [place], [place]  [address] Phone: [phone]; Fax: [phone]

## 2017-07-30 NOTE — Patient Instructions (Signed)
Medication Instructions:  Your physician has recommended you make the following change in your medication:   TAKE lasix AS NEEDED  Labwork: None ordered  Testing/Procedures: None ordered  Follow-Up: Your physician wants you to follow-up in 2 months with Dr. Donovan KailNelson---Call us back to schedule   Any Other Special Instructions Will Be Listed Below (If Applicable).     If you need a refill on your cardiac medications before your next appointment, please call your pharmacy.

## 2017-07-31 ENCOUNTER — Ambulatory Visit: Payer: 59 | Admitting: Cardiology

## 2017-09-28 ENCOUNTER — Other Ambulatory Visit: Payer: Self-pay

## 2017-09-28 ENCOUNTER — Ambulatory Visit (HOSPITAL_COMMUNITY)
Admission: EM | Admit: 2017-09-28 | Discharge: 2017-09-28 | Disposition: A | Discharge status: Home / Self Care | Payer: Medicaid Other | Attending: Internal Medicine | Admitting: Internal Medicine

## 2017-09-28 ENCOUNTER — Encounter (HOSPITAL_COMMUNITY): Payer: Self-pay

## 2017-09-28 DIAGNOSIS — Z880 Allergy status to penicillin: Secondary | ICD-10-CM | POA: Insufficient documentation

## 2017-09-28 DIAGNOSIS — Z8349 Family history of other endocrine, nutritional and metabolic diseases: Secondary | ICD-10-CM | POA: Insufficient documentation

## 2017-09-28 DIAGNOSIS — Z79899 Other long term (current) drug therapy: Secondary | ICD-10-CM | POA: Insufficient documentation

## 2017-09-28 DIAGNOSIS — Z882 Allergy status to sulfonamides status: Secondary | ICD-10-CM | POA: Diagnosis not present

## 2017-09-28 DIAGNOSIS — Z8249 Family history of ischemic heart disease and other diseases of the circulatory system: Secondary | ICD-10-CM | POA: Diagnosis not present

## 2017-09-28 DIAGNOSIS — Z833 Family history of diabetes mellitus: Secondary | ICD-10-CM | POA: Diagnosis not present

## 2017-09-28 DIAGNOSIS — Z825 Family history of asthma and other chronic lower respiratory diseases: Secondary | ICD-10-CM | POA: Insufficient documentation

## 2017-09-28 DIAGNOSIS — J029 Acute pharyngitis, unspecified: Secondary | ICD-10-CM | POA: Diagnosis present

## 2017-09-28 DIAGNOSIS — J039 Acute tonsillitis, unspecified: Secondary | ICD-10-CM | POA: Insufficient documentation

## 2017-09-28 DIAGNOSIS — Z87891 Personal history of nicotine dependence: Secondary | ICD-10-CM | POA: Insufficient documentation

## 2017-09-28 DIAGNOSIS — Z801 Family history of malignant neoplasm of trachea, bronchus and lung: Secondary | ICD-10-CM | POA: Insufficient documentation

## 2017-09-28 LAB — POCT RAPID STREP A: Streptococcus, Group A Screen (Direct): NEGATIVE

## 2017-09-28 MED ORDER — CEPHALEXIN 500 MG PO CAPS: 500.0000 mg | ORAL_CAPSULE | Freq: Two times a day (BID) | ORAL | 0 refills | Status: AC

## 2017-09-28 NOTE — ED Triage Notes (Signed)
Patient presents to [facility] for sore throat x3 days, pt states there are white spots on back of throat

## 2017-09-28 NOTE — Discharge Instructions (Signed)
Your rapid strep is negative today. We will culture your swab as well and let you known if positive findings. Tylenol and/or ibuprofen as needed for pain or fevers.   Push fluids to ensure adequate hydration and keep secretions thin.   If symptoms worsen or do not improve in the next week to return to be seen or to follow up with your PCP.

## 2017-09-28 NOTE — ED Provider Notes (Signed)
[facility]    CSN: [id number] Arrival date & time: 09/28/17  1827     History   Chief Complaint Chief Complaint  Patient presents with  . Sore Throat    HPI Cynthia Townsend is a 37 y.o. female.   Cynthia Townsend presents with complaints of sore throat, body aches and fevers which started 3 days ago. States feels similar to strep she has had in the past, had two episodes last year. Without cough or congestion. Bilateral ear pain, right is greater than left. Temp this morning of 100.2. Has been taking tylenol and ibuprofen which has helped some. States had a family member at her home who had been on antibiotics for strep. No other known ill contacts. Decreased appetite but without nausea or vomiting. No abdominal pain. No rash. See medical history.   ROS per HPI.      Past Medical History:  Diagnosis Date  . ADD (attention deficit disorder)   . Anemia    post partum  . Anemia requiring transfusions    of iron  . Anxiety   . Asthma   . Blood transfusion without reported diagnosis   . Carpal tunnel syndrome during pregnancy   . Chicken pox   . Classic migraine with aura 09/20/2015  . De Quervain's disease (radial styloid tenosynovitis)   . Depression   . Ectopic pregnancy   . Insomnia   . Laceration of finger    left index with nerve involvement  . Nasal polyps    denies  . Pneumonia   . Postpartum hemorrhage   . Pre-eclampsia in postpartum period   . Pregnancy induced hypertension   . Pulmonary edema   . Recurrent upper respiratory infection (URI)   . Sciatic pain   . Subclinical hypothyroidism    sub-clinical  . Urticaria    dogs  . Vitamin D deficiency     Patient Active Problem List   Diagnosis Date Noted  . Gestational hypertension 07/16/2017  . Chronic hypertension 07/15/2017  . Hypokalemia 06/26/2017  . Hyperglycemia 06/26/2017  . Fatigue 04/28/2017  . Mild persistent asthma 04/01/2017  . Chronic rhinitis 04/01/2017  . Pregnant and not yet  delivered, second trimester 04/01/2017  . Cellulitis of finger, left 03/25/2017  . Dog bite 03/25/2017  . Allergy 02/25/2017  . Nasal turbinate hypertrophy 02/25/2017  . Acute asthma flare 01/28/2017  . Recurrent infections 01/22/2017  . Acute pulmonary edema (HCC) 09/03/2016  . Cough 04/03/2016  . Sleep disturbance 02/29/2016  . Classic migraine with aura 09/20/2015  . Shortness of breath 09/04/2015  . PIH (pregnancy induced hypertension) 08/31/2015  . Tenosynovitis of thumb 04/06/2015  . Irregular menstrual cycle 12/29/2014  . ADD (attention deficit disorder) 12/29/2014  . Vitamin D deficiency 12/29/2014  . Generalized anxiety disorder 12/29/2014  . ADHD (attention deficit hyperactivity disorder) 11/20/2011  . Anemia 11/20/2011  . Hypothyroidism 07/31/2011  . Depression 07/31/2011  . Breast pain 07/16/2011    Past Surgical History:  Procedure Laterality Date  . BREAST BIOPSY     benign  punch bx  . DILATION AND CURETTAGE OF UTERUS    . DILATION AND EVACUATION N/A 09/04/2015   Procedure: DILATATION AND EVACUATION;  Surgeon: Mitchel Honour, DO;  Location: [facility];  Service: Gynecology;  Laterality: N/A;  . DNC    . I&D EXTREMITY Left 04/10/2017   Procedure: LEFT INDEX FINGER WOUND REPAIR AND NERVE REPAIR;  Surgeon: Bradly Bienenstock, MD;  Location: [facility];  Service: Orthopedics;  Laterality: Left;  OB History    Gravida  3   Para  2   Term  2   Preterm  0   AB  1   Living  2     SAB  0   TAB  0   Ectopic  1   Multiple  0   Live Births  2            Home Medications    Prior to Admission medications   Medication Sig Start Date End Date Taking? Authorizing Provider  acetaminophen (TYLENOL) 500 MG tablet Take 1,000 mg by mouth as needed for moderate pain or headache.    Yes [provider]  amLODipine (NORVASC) 5 MG tablet Take 1 tablet (5 mg total) by mouth daily. 07/25/17 10/23/17 Yes Lars Masson, MD  diphenhydrAMINE (BENADRYL) 25 MG  tablet Take 75 mg by mouth at bedtime as needed for sleep.    Yes [provider]  Prenatal Vit-Fe Fumarate-FA (PRENATAL PO) Take 1 tablet by mouth every evening.   Yes [provider]  sertraline (ZOLOFT) 100 MG tablet daily. 07/21/17  Yes [provider]  albuterol (PROVENTIL HFA;VENTOLIN HFA) 108 (90 Base) MCG/ACT inhaler Inhale 2 puffs into the lungs every 6 (six) hours as needed for wheezing or shortness of breath. Patient taking differently: Inhale 2 puffs into the lungs every 4 (four) hours as needed for wheezing or shortness of breath.  04/01/17   Bobbitt, Heywood Iles, MD  ALPRAZolam Prudy Feeler) 0.25 MG tablet Take 1 tablet (0.25 mg total) by mouth 2 (two) times daily as needed for anxiety. 06/26/17   Corwin Levins, MD  butalbital-acetaminophen-caffeine (FIORICET, ESGIC) [id number] MG tablet Take 1 tablet by mouth every 4 (four) hours as needed for headache. 11/30/15   Nilda Riggs, NP  cephALEXin (KEFLEX) 500 MG capsule Take 1 capsule (500 mg total) by mouth 2 (two) times daily for 10 days. 09/28/17 10/08/17  Georgetta Haber, NP  furosemide (LASIX) 40 MG tablet Take 1 tablet (40 mg total) by mouth as needed (swelling or shortness of breath). 07/30/17 10/28/17  Lars Masson, MD  loratadine (CLARITIN) 10 MG tablet Take 10 mg by mouth daily as needed for allergies.    [provider]    Family History Family History  Problem Relation Age of Onset  . Alcohol abuse Mother   . Lung cancer Mother   . Hyperlipidemia Mother   . Heart disease Mother   . Mental illness Mother   . Hyperthyroidism Mother   . COPD Mother   . Hyperlipidemia Father   . Heart disease Father   . Transient ischemic attack Father   . Pulmonary embolism Father   . Heart attack Father   . Heart disease Sister   . Mental illness Sister   . Heart attack Sister   . Diabetes Sister   . Rheum arthritis Maternal Grandmother   . Heart disease Maternal Uncle   . Colon cancer  Paternal Uncle     Social History Social History   Tobacco Use  . Smoking status: Former Smoker    Types: Cigarettes    Last attempt to quit: 10/08/2016    Years since quitting: 0.9  . Smokeless tobacco: Never Used  Substance Use Topics  . Alcohol use: No  . Drug use: No     Allergies   Penicillins and Sulfa antibiotics   Review of Systems Review of Systems   Physical Exam Triage Vital Signs ED Triage Vitals  Enc Vitals Group     BP 09/28/17 1906 (!) 121/96     Pulse Rate 09/28/17 1906 65     Resp 09/28/17 1906 16     Temp 09/28/17 1906 98.9 F (37.2 C)     Temp src --      SpO2 09/28/17 1906 100 %     Weight --      Height --      Head Circumference --      Peak Flow --      Pain Score 09/28/17 1907 6     Pain Loc --      Pain Edu? --      Excl. in GC? --    No data found.  Updated Vital Signs BP (!) 121/96 (BP Location: Left Arm)   Pulse 65   Temp 98.9 F (37.2 C)   Resp 16   SpO2 100%   Visual Acuity Right Eye Distance:   Left Eye Distance:   Bilateral Distance:    Right Eye Near:   Left Eye Near:    Bilateral Near:     Physical Exam  Constitutional: She is oriented to person, place, and time. She appears well-developed and well-nourished. No distress.  HENT:  Head: Normocephalic and atraumatic.  Right Ear: Tympanic membrane, external ear and ear canal normal.  Left Ear: Tympanic membrane, external ear and ear canal normal.  Nose: Nose normal.  Mouth/Throat: Uvula is midline, oropharynx is clear and moist and mucous membranes are normal. Tonsils are 2+ on the right. Tonsils are 1+ on the left. Tonsillar exudate.  Eyes: Pupils are equal, round, and reactive to light. Conjunctivae and EOM are normal.  Cardiovascular: Normal rate, regular rhythm and normal heart sounds.  Pulmonary/Chest: Effort normal and breath sounds normal.  Lymphadenopathy:    She has cervical adenopathy.  Neurological: She is alert and oriented to person, place, and  time.  Skin: Skin is warm and dry.     UC Treatments / Results  Labs (all labs ordered are listed, but only abnormal results are displayed) Labs Reviewed  CULTURE, GROUP A STREP [facility]  POCT RAPID STREP A    EKG None Radiology No results found.  Procedures Procedures (including critical care time)  Medications Ordered in UC Medications - No data to display   Initial Impression / Assessment and Plan / UC Course  I have reviewed the triage vital signs and the nursing notes.  Pertinent labs & imaging results that were available during my care of the patient were reviewed by me and considered in my medical decision making (see chart for details).     Negative rapid strep in clinic today. Febrile with acute findings on exam, will treat with keflex at this time. Throat culture pending. Will notify of any positive findings and if any changes to treatment are needed.  Patient verbalized understanding and agreeable to plan.    Final Clinical Impressions(s) / UC Diagnoses   Final diagnoses:  Tonsillitis    ED Discharge Orders        Ordered    cephALEXin (KEFLEX) 500 MG capsule  2 times daily     09/28/17 1952       Controlled Substance Prescriptions [place] Controlled Substance Registry consulted? Not Applicable   Georgetta HaberBurky, Natalie B, NP 09/28/17 2015

## 2017-10-01 LAB — CULTURE, GROUP A STREP (THRC)

## 2017-11-18 ENCOUNTER — Ambulatory Visit: Payer: Medicaid Other | Admitting: Internal Medicine

## 2017-11-18 ENCOUNTER — Encounter: Payer: Self-pay | Admitting: Internal Medicine

## 2017-11-18 VITALS — BP 136/84 | HR 93 | Temp 98.4°F | Ht 63.0 in | Wt 204.0 lb

## 2017-11-18 DIAGNOSIS — F988 Other specified behavioral and emotional disorders with onset usually occurring in childhood and adolescence: Secondary | ICD-10-CM | POA: Diagnosis not present

## 2017-11-18 DIAGNOSIS — O139 Gestational [pregnancy-induced] hypertension without significant proteinuria, unspecified trimester: Secondary | ICD-10-CM

## 2017-11-18 DIAGNOSIS — F411 Generalized anxiety disorder: Secondary | ICD-10-CM

## 2017-11-18 DIAGNOSIS — F909 Attention-deficit hyperactivity disorder, unspecified type: Secondary | ICD-10-CM

## 2017-11-18 MED ORDER — AMPHETAMINE-DEXTROAMPHETAMINE 30 MG PO TABS: 30.0000 mg | ORAL_TABLET | ORAL | 0 refills | Status: DC

## 2017-11-18 MED ORDER — SERTRALINE HCL 100 MG PO TABS: 150.0000 mg | ORAL_TABLET | Freq: Every day | ORAL | 3 refills | Status: DC

## 2017-11-18 MED ORDER — AMPHETAMINE-DEXTROAMPHETAMINE 20 MG PO TABS: ORAL_TABLET | ORAL | 0 refills | Status: DC

## 2017-11-18 MED ORDER — ALPRAZOLAM 0.25 MG PO TABS: 0.2500 mg | ORAL_TABLET | Freq: Two times a day (BID) | ORAL | 2 refills | Status: DC | PRN

## 2017-11-18 NOTE — Assessment & Plan Note (Signed)
stable overall by history and exam, recent data reviewed with pt, and pt to continue medical treatment as before,  to f/u any worsening symptoms or concerns BP Readings from Last 3 Encounters:  11/18/17 136/84  09/28/17 (!) 121/96  07/30/17 112/80

## 2017-11-18 NOTE — Assessment & Plan Note (Addendum)
stable overall by history and exam, and pt to continue medical treatment with zoloft increased to 150 mg qd,  to f/u any worsening symptoms or concerns

## 2017-11-18 NOTE — Assessment & Plan Note (Signed)
stable overall by history and exam, recent data reviewed with pt, and pt to continue medical treatment as before,  to f/u any worsening symptoms or concerns  

## 2017-11-18 NOTE — Patient Instructions (Addendum)
Ok to increase the zoloft to 150 mg per day  OK to restart the adderall 30 in the am, 20 in the pm  Please continue all other medications as before, and refills have been done if requested - xanax  Please have the pharmacy call with any other refills you may need.  Please continue your efforts at being more active, low cholesterol diet, and weight control.  Please keep your appointments with your specialists as you may have planned

## 2017-11-18 NOTE — Assessment & Plan Note (Signed)
stable overall by history and exam, and pt to continue medical treatment as before,  to f/u any worsening symptoms or concerns 

## 2017-11-18 NOTE — Progress Notes (Signed)
Subjective:    Patient ID: Cynthia Townsend, female    DOB: 04/26/1981, 37 y.o.   MRN: [id number]  HPI  Here to f/u anxiety, depression and ADD; now 4 mo post partum, started zoloft 100 qd per GYN and much improved, asks for 150 mg; down 12 lbs intentionally after baby.  2 children now.  Not pregnant, no plans for further children.  Denies worsening depressive symptoms, suicidal ideation, or panic; has ongoing anxiety, asks for med refills.  Also needs to restart ADD med as needs to work and unable to complete tasks Past Medical History:  Diagnosis Date  . ADD (attention deficit disorder)   . Anemia    post partum  . Anemia requiring transfusions    of iron  . Anxiety   . Asthma   . Blood transfusion without reported diagnosis   . Carpal tunnel syndrome during pregnancy   . Chicken pox   . Classic migraine with aura 09/20/2015  . De Quervain's disease (radial styloid tenosynovitis)   . Depression   . Ectopic pregnancy   . Insomnia   . Laceration of finger    left index with nerve involvement  . Nasal polyps    denies  . Pneumonia   . Postpartum hemorrhage   . Pre-eclampsia in postpartum period   . Pregnancy induced hypertension   . Pulmonary edema   . Recurrent upper respiratory infection (URI)   . Sciatic pain   . Subclinical hypothyroidism    sub-clinical  . Urticaria    dogs  . Vitamin D deficiency    Past Surgical History:  Procedure Laterality Date  . BREAST BIOPSY     benign  punch bx  . DILATION AND CURETTAGE OF UTERUS    . DILATION AND EVACUATION N/A 09/04/2015   Procedure: DILATATION AND EVACUATION;  Surgeon: Mitchel HonourMegan Morris, DO;  Location: [facility];  Service: Gynecology;  Laterality: N/A;  . DNC    . I&D EXTREMITY Left 04/10/2017   Procedure: LEFT INDEX FINGER WOUND REPAIR AND NERVE REPAIR;  Surgeon: Bradly Bienenstockrtmann, Fred, MD;  Location: [facility];  Service: Orthopedics;  Laterality: Left;    reports that she quit smoking about 13 months ago. Her smoking use included  cigarettes. She has never used smokeless tobacco. She reports that she does not drink alcohol or use drugs. family history includes Alcohol abuse in her mother; COPD in her mother; Colon cancer in her paternal uncle; Diabetes in her sister; Heart attack in her father and sister; Heart disease in her father, maternal uncle, mother, and sister; Hyperlipidemia in her father and mother; Hyperthyroidism in her mother; Lung cancer in her mother; Mental illness in her mother and sister; Pulmonary embolism in her father; Rheum arthritis in her maternal grandmother; Transient ischemic attack in her father. Allergies  Allergen Reactions  . Penicillins Shortness Of Breath, Swelling, Rash and Other (See Comments)    Has patient had a PCN reaction causing immediate rash, facial/tongue/throat swelling, SOB or lightheadedness with hypotension: Yes Has patient had a PCN reaction causing severe rash involving mucus membranes or skin necrosis: No Has patient had a PCN reaction that required hospitalization: No Has patient had a PCN reaction occurring within the last 10 years: No If all of the above answers are "NO", then may proceed with Cephalosporin use.    . Sulfa Antibiotics Hives and Rash   Current Outpatient Medications on File Prior to Visit  Medication Sig Dispense Refill  . albuterol (PROVENTIL HFA;VENTOLIN HFA) 108 (90 Base)  MCG/ACT inhaler Inhale 2 puffs into the lungs every 6 (six) hours as needed for wheezing or shortness of breath. (Patient taking differently: Inhale 2 puffs into the lungs every 4 (four) hours as needed for wheezing or shortness of breath. ) 1 Inhaler 1  . butalbital-acetaminophen-caffeine (FIORICET, ESGIC) 50-325-40 MG tablet Take 1 tablet by mouth every 4 (four) hours as needed for headache. 30 tablet 1  . diphenhydrAMINE (BENADRYL) 25 MG tablet Take 75 mg by mouth at bedtime as needed for sleep.     Marland Kitchen loratadine (CLARITIN) 10 MG tablet Take 10 mg by mouth daily as needed for  allergies.    . furosemide (LASIX) 40 MG tablet Take 1 tablet (40 mg total) by mouth as needed (swelling or shortness of breath). 30 tablet 3   No current facility-administered medications on file prior to visit.    Review of Systems All otherwise neg per pt    Objective:   Physical Exam BP 136/84   Pulse 93   Temp 98.4 F (36.9 C) (Oral)   Ht 5\' 3"  (1.6 m)   Wt 204 lb (92.5 kg)   SpO2 99%   BMI 36.14 kg/m  VS noted, not ill appearing, obese Constitutional: Pt appears in NAD HENT: Head: NCAT.  Right Ear: External ear normal.  Left Ear: External ear normal.  Eyes: . Pupils are equal, round, and reactive to light. Conjunctivae and EOM are normal Nose: without d/c or deformity Neck: Neck supple. Gross normal ROM Cardiovascular: Normal rate and regular rhythm.   Pulmonary/Chest: Effort normal and breath sounds without rales or wheezing.  Neurological: Pt is alert. At baseline orientation, motor grossly intact Skin: Skin is warm. No rashes, other new lesions, no LE edema Psychiatric: Pt behavior is normal without agitation, + mild depressed nervous No other exam findings    Assessment & Plan:

## 2018-01-06 ENCOUNTER — Other Ambulatory Visit: Payer: Self-pay | Admitting: Internal Medicine

## 2018-01-07 MED ORDER — AMPHETAMINE-DEXTROAMPHETAMINE 20 MG PO TABS
ORAL_TABLET | ORAL | 0 refills | Status: DC
Start: 2018-01-07 — End: 2018-02-14

## 2018-01-07 MED ORDER — AMPHETAMINE-DEXTROAMPHETAMINE 30 MG PO TABS: 30.0000 mg | ORAL_TABLET | ORAL | 0 refills | Status: DC

## 2018-01-07 NOTE — Telephone Encounter (Signed)
Done erx 

## 2018-02-14 ENCOUNTER — Other Ambulatory Visit: Payer: Self-pay | Admitting: Internal Medicine

## 2018-02-16 ENCOUNTER — Telehealth: Payer: Self-pay | Admitting: *Deleted

## 2018-02-16 MED ORDER — AMPHETAMINE-DEXTROAMPHETAMINE 20 MG PO TABS: ORAL_TABLET | ORAL | 0 refills | Status: DC

## 2018-02-16 MED ORDER — AMPHETAMINE-DEXTROAMPHETAMINE 30 MG PO TABS: 30.0000 mg | ORAL_TABLET | ORAL | 0 refills | Status: DC

## 2018-02-16 NOTE — Telephone Encounter (Signed)
Last refill was 01/07/18 for the 30 mg And 01/09/18 for the 20 mg

## 2018-02-16 NOTE — Telephone Encounter (Signed)
Received fax from Emily, [facility] requesting a refill on amlodipine 5 mg however this isn't listed on patients current med list. Please advise. Thanks, MI

## 2018-02-16 NOTE — Telephone Encounter (Signed)
Dr. Delton See, it looks like pt is needing a refill of her amlodipine 5 mg po daily.  Appears her PCP discontinued this med on 6/11, but no notation made as to why.  Would you like to refill? We advised on this on 07/25/17, as you can reference ot 07/25/17 telephone encounter.  We advised her to continue her lasix and increase her amlodipine to 5 mg po daily, for complaints of high BP readings, and LEE.  Please advise!

## 2018-02-16 NOTE — Telephone Encounter (Signed)
Ok to refill 

## 2018-02-16 NOTE — Telephone Encounter (Signed)
Done erx 

## 2018-02-17 MED ORDER — ALPRAZOLAM 0.25 MG PO TABS: 0.2500 mg | ORAL_TABLET | Freq: Two times a day (BID) | ORAL | 2 refills | Status: DC | PRN

## 2018-02-17 MED ORDER — AMLODIPINE BESYLATE 5 MG PO TABS: 5.0000 mg | ORAL_TABLET | Freq: Every day | ORAL | 2 refills | Status: DC

## 2018-02-17 NOTE — Telephone Encounter (Signed)
Pt states [facility] not receive the refill request for adderall and she is requesting if this can be resent.

## 2018-02-17 NOTE — Telephone Encounter (Signed)
MD approved and re-sent electronically to pof.Marland KitchenRaechel Chute

## 2018-02-17 NOTE — Telephone Encounter (Signed)
Done erx 

## 2018-02-17 NOTE — Telephone Encounter (Signed)
Per Dr Delton See, order given to send script for amlodipine 5 mg po daily to pts requesting pharmacy[facility].  Will send this in.

## 2018-02-17 NOTE — Telephone Encounter (Signed)
Pt states pharmacy did not receive the the ADDERALL, , please resend,   The patient not has Medicaid [place] Access and we cannot see her ,   She would like a refill of her XANAX as well. She has 3 pills left.  Please advise

## 2018-04-01 ENCOUNTER — Other Ambulatory Visit: Payer: Self-pay | Admitting: Internal Medicine

## 2018-04-01 MED ORDER — AMPHETAMINE-DEXTROAMPHETAMINE 30 MG PO TABS: 30.0000 mg | ORAL_TABLET | ORAL | 0 refills | Status: DC

## 2018-04-01 MED ORDER — AMPHETAMINE-DEXTROAMPHETAMINE 20 MG PO TABS: ORAL_TABLET | ORAL | 0 refills | Status: DC

## 2018-04-01 NOTE — Telephone Encounter (Signed)
Check  registry last filled both on 02/16/2018. MD is out of office for 2 weeks pls advise.Marland KitchenRaechel Chute

## 2018-04-01 NOTE — Telephone Encounter (Signed)
MD approved and sent electronically to pof../lmb  

## 2018-04-29 ENCOUNTER — Other Ambulatory Visit: Payer: Self-pay | Admitting: Internal Medicine

## 2018-04-29 MED ORDER — AMPHETAMINE-DEXTROAMPHETAMINE 20 MG PO TABS: ORAL_TABLET | ORAL | 0 refills | Status: DC

## 2018-04-29 MED ORDER — AMPHETAMINE-DEXTROAMPHETAMINE 30 MG PO TABS: 30.0000 mg | ORAL_TABLET | ORAL | 0 refills | Status: DC

## 2018-04-29 NOTE — Telephone Encounter (Signed)
Done erx 

## 2018-05-02 ENCOUNTER — Other Ambulatory Visit: Payer: Self-pay | Admitting: Internal Medicine

## 2018-05-02 ENCOUNTER — Encounter: Payer: Self-pay | Admitting: Internal Medicine

## 2018-05-04 MED ORDER — ALPRAZOLAM 0.25 MG PO TABS: 0.2500 mg | ORAL_TABLET | Freq: Three times a day (TID) | ORAL | 2 refills | Status: DC | PRN

## 2018-05-04 MED ORDER — AMPHETAMINE-DEXTROAMPHETAMINE 30 MG PO TABS: 30.0000 mg | ORAL_TABLET | Freq: Two times a day (BID) | ORAL | 0 refills | Status: DC

## 2018-05-04 NOTE — Telephone Encounter (Signed)
Ok for med dose change

## 2018-05-04 NOTE — Telephone Encounter (Signed)
Done erx 

## 2018-06-23 ENCOUNTER — Other Ambulatory Visit: Payer: Self-pay | Admitting: Internal Medicine

## 2018-06-24 MED ORDER — AMPHETAMINE-DEXTROAMPHETAMINE 30 MG PO TABS: 30.0000 mg | ORAL_TABLET | Freq: Two times a day (BID) | ORAL | 0 refills | Status: DC

## 2018-06-24 NOTE — Telephone Encounter (Signed)
Done erx 

## 2018-07-25 ENCOUNTER — Other Ambulatory Visit: Payer: Self-pay | Admitting: Internal Medicine

## 2018-07-27 ENCOUNTER — Other Ambulatory Visit: Payer: Self-pay | Admitting: Internal Medicine

## 2018-07-27 MED ORDER — AMPHETAMINE-DEXTROAMPHETAMINE 30 MG PO TABS: 30.0000 mg | ORAL_TABLET | Freq: Two times a day (BID) | ORAL | 0 refills | Status: DC

## 2018-07-27 NOTE — Telephone Encounter (Signed)
Done erx 

## 2018-07-28 NOTE — Telephone Encounter (Signed)
Done erx 

## 2018-08-23 ENCOUNTER — Other Ambulatory Visit: Payer: Self-pay | Admitting: Internal Medicine

## 2018-08-24 MED ORDER — AMPHETAMINE-DEXTROAMPHETAMINE 30 MG PO TABS: 30.0000 mg | ORAL_TABLET | Freq: Two times a day (BID) | ORAL | 0 refills | Status: DC

## 2018-08-24 NOTE — Telephone Encounter (Signed)
Done erx 

## 2018-09-11 ENCOUNTER — Telehealth: Payer: Self-pay | Admitting: Internal Medicine

## 2018-09-11 MED ORDER — BUTALBITAL-APAP-CAFFEINE 50-325-40 MG PO TABS: 1.0000 | ORAL_TABLET | ORAL | 1 refills | Status: DC | PRN

## 2018-09-11 NOTE — Addendum Note (Signed)
Addended by: Corwin Levins on: 09/11/2018 11:38 AM   Modules accepted: Orders

## 2018-09-11 NOTE — Telephone Encounter (Signed)
CRM for notification. See Telephone encounter for: 09/11/18.  Patient called and wants to know if Dr. Jonny Townsend would call her in something for migraines. She said that they have return and she feels miserable. Please call patient back, thanks.

## 2018-09-11 NOTE — Telephone Encounter (Signed)
Ok for refill of the previous fioricet - done erx

## 2018-09-11 NOTE — Telephone Encounter (Signed)
Pt has been informed.

## 2018-09-18 ENCOUNTER — Telehealth (INDEPENDENT_AMBULATORY_CARE_PROVIDER_SITE_OTHER): Payer: Medicaid Other | Admitting: Family

## 2018-09-18 DIAGNOSIS — S40869A Insect bite (nonvenomous) of unspecified upper arm, initial encounter: Secondary | ICD-10-CM | POA: Diagnosis not present

## 2018-09-18 DIAGNOSIS — W57XXXA Bitten or stung by nonvenomous insect and other nonvenomous arthropods, initial encounter: Secondary | ICD-10-CM | POA: Diagnosis not present

## 2018-09-18 MED ORDER — DOXYCYCLINE HYCLATE 100 MG PO TABS: 200.0000 mg | ORAL_TABLET | Freq: Once | ORAL | 0 refills | Status: AC

## 2018-09-18 NOTE — Progress Notes (Signed)
Thank you for describing your tick bite, Here is how we plan to help! Based on the information that you shared with me it looks like you may have A tick that bite that we will treat with a short course of doxycycline.  In most cases a tick bite is painless and does not itch.  Most tick bites in which the tick is quickly removed do not require prescriptions. Ticks can transmit several diseases if they are infected and remain attacked to your skin. Therefore the length that the tick was attached and any symptoms you have experienced after the bite are import to accurately develop your custom treatment plan. In most cases a single dose of doxycycline may prevent the development of a more serious condition.  Based on your information I have Provided a home care guide for tick bites  and  instructions on when to call for help. and I have sent a single dose of doxycycline to the pharmacy you selected. Please make sure that you selected a pharmacy that is open now.   You may also apply benadryl cream to help with itching.  Which ticks  are associated with illness?  The Wood Tick (dog tick) is the size of a watermelon seed and can sometimes transmit [facility] spotted fever and [place] tick fever.   The Deer Tick (black-legged tick) is between the size of a poppy seed (pin head) and an apple seed, and can sometimes transmit Lyme disease.  A brown to black tick with a white splotch on its back is likely a female Amblyomma americanum ([place] tick). This tick has been associated with Southern Tick Associated illness ( STARI)  Lyme disease has become the most common tick-borne illness in the [place]. The risk of Lyme disease following a recognized deer tick bite is estimated to be 1%.  The majority of cases of Lyme disease start with a bull's eye rash at the site of the tick bite. The rash can occur days to weeks (typically 7-10 days) after a tick bite. Treatment with antibiotics is indicated  if this rash appears. Flu-like symptoms may accompany the rash, including: fever, chills, headaches, muscle aches, and fatigue. Removing ticks promptly may prevent tick borne disease.  What can be used to prevent Tick Bites?   Insect repellant with at leas 20% DEET.  Wearing long pants with sock and shoes.  Avoiding tall grass and heavily wooded areas.  Checking your skin after being outdoors.  Shower with a washcloth after outdoor exposures.  HOME CARE ADVICE FOR TICK BITE  1. Wood Tick Removal:  o Use a pair of tweezers and grasp the wood tick close to the skin (on its head). Pull the wood tick straight upward without twisting or crushing it. Maintain a steady pressure until it releases its grip.   o If tweezers aren't available, use fingers, a loop of thread around the jaws, or a needle between the jaws for traction.  o Note: covering the tick with petroleum jelly, nail polish or rubbing alcohol doesn't work. Neither does touching the tick with a hot or cold object. 2. Tiny Deer Tick Removal:   o Needs to be scraped off with a knife blade or credit card edge. o Place tick in a sealed container (e.g. glass jar, zip lock plastic bag), in case your doctor wants to see it. 3. Tick's Head Removal:  o If the wood tick's head breaks off in the skin, it must be removed. Clean the skin.  Then use a sterile needle to uncover the head and lift it out or scrape it off.  o If a very small piece of the head remains, the skin will eventually slough it off. 4. Antibiotic Ointment:  o Wash the wound and your hands with soap and water after removal to prevent catching any tick disease.  Apply an over the counter antibiotic ointment (e.g. bacitracin) to the bite once. 5. Expected Course: Tick bites normally don't itch or hurt. That's why they often go unnoticed. 6. Call Your Doctor If:  o You can't remove the tick or the tick's head o Fever, a severe head ache, or rash occur in the next 2  weeks o Bite begins to look infected o Lyme's disease is common in your area o You have not had a tetanus in the last 10 years o Your current symptoms become worse    MAKE SURE YOU   Understand these instructions.  Will watch your condition.  Will get help right away if you are not doing well or get worse.   Thank you for choosing an e-visit.  Your e-visit answers were reviewed by a board certified advanced clinical practitioner to complete your personal care plan. Depending upon the condition, your plan could have included both over the counter or prescription medications. Please review your pharmacy choice. If there is a problem you may use MyChart messaging to have the prescription routed to another pharmacy. Your safety is important to [place]. If you have drug allergies check your prescription carefully.   You can use MyChart to ask questions about today's visit, request a non-urgent call back, or ask for a work or school excuse for 24 hours related to this e-Visit. If it has been greater than 24 hours you will need to follow up with your provider, or enter a new e-Visit to address those concerns.  You will get an email in the next two days asking about your experience. I hope  that your e-visit has been valuable and will speed your recovery

## 2018-09-19 MED ORDER — DOXYCYCLINE HYCLATE 100 MG PO TABS: 100.0000 mg | ORAL_TABLET | Freq: Two times a day (BID) | ORAL | 0 refills | Status: DC

## 2018-09-19 NOTE — Addendum Note (Signed)
Addended by: Bennie Pierini on: 09/19/2018 08:16 AM   Modules accepted: Orders

## 2018-09-23 ENCOUNTER — Other Ambulatory Visit: Payer: Self-pay | Admitting: Internal Medicine

## 2018-09-23 MED ORDER — AMPHETAMINE-DEXTROAMPHETAMINE 30 MG PO TABS: 30.0000 mg | ORAL_TABLET | Freq: Two times a day (BID) | ORAL | 0 refills | Status: DC

## 2018-09-23 NOTE — Telephone Encounter (Signed)
Done erx 

## 2018-09-24 ENCOUNTER — Other Ambulatory Visit: Payer: Self-pay | Admitting: Internal Medicine

## 2018-09-24 ENCOUNTER — Encounter: Payer: Self-pay | Admitting: Internal Medicine

## 2018-09-24 NOTE — Telephone Encounter (Signed)
Done erx 

## 2018-09-24 NOTE — Telephone Encounter (Signed)
[place] Controlled Database Checked Last filled: 08/27/18 #45  LOV w/you: 11/18/17 Next appt w/you: None

## 2018-10-20 ENCOUNTER — Other Ambulatory Visit: Payer: Self-pay | Admitting: Internal Medicine

## 2018-10-20 MED ORDER — AMPHETAMINE-DEXTROAMPHETAMINE 30 MG PO TABS: 30.0000 mg | ORAL_TABLET | Freq: Two times a day (BID) | ORAL | 0 refills | Status: DC

## 2018-10-20 NOTE — Telephone Encounter (Signed)
Done erx 

## 2018-10-24 ENCOUNTER — Other Ambulatory Visit: Payer: Self-pay | Admitting: Internal Medicine

## 2018-10-26 NOTE — Telephone Encounter (Signed)
Done erx 

## 2018-11-10 ENCOUNTER — Ambulatory Visit: Payer: Self-pay | Admitting: *Deleted

## 2018-11-10 NOTE — Telephone Encounter (Signed)
Pt called stating that she fell a month ago on her back when she tripped over one of her children's toys,  the says that she has also has left abdominal pain that started a month ago; the pain is located underneath her left bottom rib and goes to her back; she says that bending, coughing, and twisting makes it worse, and feels like it is crushing; the pt says that she had 1 episode of emesis this morning , and it had streaks of blood; she has been feeling bloated since the fall, and is feeling "gassy"; the pt says that her abdominal pain is her greatest pain; pain is 3 out of 10 when sitting, and 8 out of 10 when moving; the pt says that her urine has been dark yellow since this event, and has a stronger smell; she has been taking tylenol and aleive; the pt says that she is always tense in anticipation of having pain with movement;  recommendations made per nurse triage protocol; the pt verbalizes understanding; pt transferred to Sam for scheduling; she normally sees Dr Oliver Barre; will route to office for notification.n      Reason for Disposition . [1] Vomiting AND [2] abdomen looks much more swollen than usual  Answer Assessment - Initial Assessment Questions 1. LOCATION: "Where does it hurt?"      Left upper abdomien 2. RADIATION: "Does the pain shoot anywhere else?" (e.g., chest, back)     Left back 3. ONSET: "When did the pain begin?" (e.g., minutes, hours or days ago)      10/10/2018 4. SUDDEN: "Gradual or sudden onset?"   sudden 5. PATTERN "Does the pain come and go, or is it constant?"    - If constant: "Is it getting better, staying the same, or worsening?"      (Note: Constant means the pain never goes away completely; most serious pain is constant and it progresses)     - If intermittent: "How long does it last?" "Do you have pain now?"     (Note: Intermittent means the pain goes away completely between bouts)     constant 6. SEVERITY: "How bad is the pain?"  (e.g., Scale 1-10; mild,  moderate, or severe)   - MILD (1-3): doesn't interfere with normal activities, abdomen soft and not tender to touch    - MODERATE (4-7): interferes with normal activities or awakens from sleep, tender to touch    - SEVERE (8-10): excruciating pain, doubled over, unable to do any normal activities  3-8 out of 10 7. RECURRENT SYMPTOM: "Have you ever had this type of abdominal pain before?" If so, ask: "When was the last time?" and "What happened that time?"     no 8. CAUSE: "What do you think is causing the abdominal pain?"   Pt fell 9. RELIEVING/AGGRAVATING FACTORS: "What makes it better or worse?" (e.g., movement, antacids, bowel movement)    Nothing makes better 10. OTHER SYMPTOMS: "Has there been any vomiting, diarrhea, constipation, or urine problems?"      Urine problems 11. PREGNANCY: "Is there any chance you are pregnant?" "When was your last menstrual period?"       No, LMP 10/30/2018  Protocols used: ABDOMINAL PAIN - [facility]

## 2018-11-12 ENCOUNTER — Other Ambulatory Visit: Payer: Self-pay | Admitting: Cardiology

## 2018-11-19 ENCOUNTER — Other Ambulatory Visit: Payer: Self-pay | Admitting: Internal Medicine

## 2018-11-20 MED ORDER — AMPHETAMINE-DEXTROAMPHETAMINE 30 MG PO TABS: 30.0000 mg | ORAL_TABLET | Freq: Two times a day (BID) | ORAL | 0 refills | Status: DC

## 2018-11-20 NOTE — Telephone Encounter (Signed)
Done erx  Please let pt know, needs ROV for further refills controlled substances

## 2018-11-25 ENCOUNTER — Other Ambulatory Visit: Payer: Self-pay | Admitting: Internal Medicine

## 2018-11-25 NOTE — Telephone Encounter (Signed)
Done 1 mo only due to office refill polic  Please make ROV for further refills

## 2018-12-14 ENCOUNTER — Other Ambulatory Visit: Payer: Self-pay | Admitting: Internal Medicine

## 2018-12-15 NOTE — Telephone Encounter (Signed)
Done erx 

## 2018-12-21 ENCOUNTER — Telehealth: Payer: Self-pay

## 2018-12-21 ENCOUNTER — Other Ambulatory Visit: Payer: Self-pay | Admitting: Internal Medicine

## 2018-12-21 MED ORDER — AMPHETAMINE-DEXTROAMPHETAMINE 30 MG PO TABS: 30.0000 mg | ORAL_TABLET | Freq: Two times a day (BID) | ORAL | 0 refills | Status: DC

## 2018-12-21 NOTE — Addendum Note (Signed)
Addended by: Biagio Borg on: 12/21/2018 07:09 PM   Modules accepted: Orders

## 2018-12-21 NOTE — Telephone Encounter (Signed)
Done erx 

## 2018-12-21 NOTE — Telephone Encounter (Signed)
Copied from [place] [id number]. Topic: Quick Communication - Rx Refill/Question >> Dec 21, 2018  3:11 PM Yvette Rack wrote: Pt stated she has an appt set for 02/25/19 with a psychiatrist   Medication: amphetamine-dextroamphetamine (ADDERALL) 30 MG tablet and ALPRAZolam (XANAX) 0.5 MG tablet  Has the patient contacted their pharmacy? yes   Preferred Pharmacy (with phone number or street name): [id number] [place], [place] [phone] (Phone)  [phone] (Fax)  Agent: Please be advised that RX refills may take up to 3 business days. We ask that you follow-up with your pharmacy.

## 2019-01-25 ENCOUNTER — Telehealth: Payer: Self-pay | Admitting: Internal Medicine

## 2019-01-25 ENCOUNTER — Other Ambulatory Visit: Payer: Self-pay | Admitting: Internal Medicine

## 2019-01-25 NOTE — Telephone Encounter (Signed)
Medication: amphetamine-dextroamphetamine (ADDERALL) 30 MG tablet     Patient is requesting refill.    Pharmacy  [id number] [place], [place] (Phone) [phone] (Fax)

## 2019-01-26 MED ORDER — AMPHETAMINE-DEXTROAMPHETAMINE 30 MG PO TABS: 30.0000 mg | ORAL_TABLET | Freq: Two times a day (BID) | ORAL | 0 refills | Status: DC

## 2019-01-26 NOTE — Telephone Encounter (Signed)
Done erx  Needs ROV for further refills after this

## 2019-01-26 NOTE — Telephone Encounter (Signed)
Done erx  Please let pt know, this is last rx , needs OV for further refills

## 2019-02-06 ENCOUNTER — Other Ambulatory Visit: Payer: Self-pay | Admitting: Cardiology

## 2019-02-09 ENCOUNTER — Other Ambulatory Visit: Payer: Self-pay | Admitting: Internal Medicine

## 2019-02-09 NOTE — Telephone Encounter (Signed)
Ok to let pt know -  We received refill request for fioricet but this was already for the last rx aug 18; will need ROV for further refills due to being a controlled substance and last OV was June 2019

## 2019-03-02 ENCOUNTER — Ambulatory Visit: Payer: Medicaid Other | Admitting: Internal Medicine

## 2019-03-15 ENCOUNTER — Other Ambulatory Visit: Payer: Self-pay | Admitting: Internal Medicine

## 2019-05-04 NOTE — Progress Notes (Signed)
Greater than 5 minutes, yet less than 10 minutes of time have been spent researching, coordinating, and implementing care for this patient today.  Thank you for the details you included in the comment boxes. Those details are very helpful in determining the best course of treatment for you and help us to provide the best care.  

## 2020-03-15 ENCOUNTER — Other Ambulatory Visit: Payer: Self-pay | Admitting: Internal Medicine

## 2020-03-15 DIAGNOSIS — Z1231 Encounter for screening mammogram for malignant neoplasm of breast: Secondary | ICD-10-CM

## 2020-03-27 ENCOUNTER — Ambulatory Visit: Payer: Medicaid Other

## 2020-05-08 ENCOUNTER — Ambulatory Visit
Admission: RE | Admit: 2020-05-08 | Discharge: 2020-05-08 | Disposition: A | Discharge status: Home / Self Care | Payer: Medicaid Other | Source: Ambulatory Visit | Attending: Internal Medicine | Admitting: Internal Medicine

## 2020-05-08 ENCOUNTER — Other Ambulatory Visit: Payer: Self-pay

## 2020-05-08 DIAGNOSIS — Z1231 Encounter for screening mammogram for malignant neoplasm of breast: Secondary | ICD-10-CM

## 2021-07-16 ENCOUNTER — Other Ambulatory Visit (HOSPITAL_COMMUNITY): Payer: Self-pay

## 2021-07-16 ENCOUNTER — Telehealth: Payer: Self-pay | Admitting: *Deleted

## 2021-07-16 DIAGNOSIS — I10 Essential (primary) hypertension: Secondary | ICD-10-CM | POA: Diagnosis not present

## 2021-07-16 DIAGNOSIS — R053 Chronic cough: Secondary | ICD-10-CM | POA: Diagnosis not present

## 2021-07-16 DIAGNOSIS — E782 Mixed hyperlipidemia: Secondary | ICD-10-CM | POA: Diagnosis not present

## 2021-07-16 DIAGNOSIS — E6609 Other obesity due to excess calories: Secondary | ICD-10-CM | POA: Diagnosis not present

## 2021-07-16 DIAGNOSIS — F3341 Major depressive disorder, recurrent, in partial remission: Secondary | ICD-10-CM | POA: Diagnosis not present

## 2021-07-16 DIAGNOSIS — F411 Generalized anxiety disorder: Secondary | ICD-10-CM | POA: Diagnosis not present

## 2021-07-16 DIAGNOSIS — J4530 Mild persistent asthma, uncomplicated: Secondary | ICD-10-CM | POA: Diagnosis not present

## 2021-07-16 DIAGNOSIS — R002 Palpitations: Secondary | ICD-10-CM | POA: Diagnosis not present

## 2021-07-16 DIAGNOSIS — E039 Hypothyroidism, unspecified: Secondary | ICD-10-CM | POA: Diagnosis not present

## 2021-07-16 DIAGNOSIS — Z6832 Body mass index (BMI) 32.0-32.9, adult: Secondary | ICD-10-CM | POA: Diagnosis not present

## 2021-07-16 DIAGNOSIS — J479 Bronchiectasis, uncomplicated: Secondary | ICD-10-CM | POA: Diagnosis not present

## 2021-07-16 MED ORDER — PREDNISONE 10 MG (21) PO TBPK
ORAL_TABLET | ORAL | 0 refills | Status: DC
  Filled 2021-07-16: qty 21, 6d supply, fill #0

## 2021-07-16 NOTE — Telephone Encounter (Signed)
-----   Message from [facility] sent at 07/16/2021  3:16 PM EST ----- Regarding: RE: please schedule pt to establish with Dr. Dedra Skeens afternoon,  Patient scheduled for 08/08/21 at 1:40pm with Dr. Shari Prows. Patient declined to be added to the wait list because of her work schedule.   Thank you,  Leah ----- Message ----- From: Loa Socks, LPN Sent: 09/14/5679   2:28 PM EST To: Loa Socks, LPN, Carolanne Grumbling, # Subject: please schedule pt to establish with Dr. Pem#  This is a former Dr. Delton See pt last seen back in 07/2017. She will be a new pt given last time she was seen. She was referred back to cards by her PCP sent in a referral this morning for pt to see and establish with Dr. Shari Prows. She's  been having a lot of palpitations that take her breath away. She is also having sob with exertion.  Can you please call and schedule her to see Dr. Shari Prows in new patient slot that I have held for you on 08/08/21 at 1:40 pm?  Let me know if she confirms to this.  This is the soonest availability I have to offer.    Thanks, [employer]

## 2021-07-18 ENCOUNTER — Telehealth: Payer: Self-pay

## 2021-07-18 NOTE — Telephone Encounter (Signed)
REFERRAL ONLY SCANNED TO REFERRAL ?

## 2021-07-23 ENCOUNTER — Other Ambulatory Visit (HOSPITAL_COMMUNITY): Payer: Self-pay

## 2021-07-24 ENCOUNTER — Other Ambulatory Visit (HOSPITAL_COMMUNITY): Payer: Self-pay

## 2021-07-26 ENCOUNTER — Other Ambulatory Visit (HOSPITAL_COMMUNITY): Payer: Self-pay

## 2021-07-30 ENCOUNTER — Other Ambulatory Visit (HOSPITAL_COMMUNITY): Payer: Self-pay

## 2021-08-03 ENCOUNTER — Emergency Department (HOSPITAL_BASED_OUTPATIENT_CLINIC_OR_DEPARTMENT_OTHER)
Admission: EM | Admit: 2021-08-03 | Discharge: 2021-08-03 | Disposition: A | Discharge status: Home / Self Care | Payer: 59 | Attending: Emergency Medicine | Admitting: Emergency Medicine

## 2021-08-03 ENCOUNTER — Encounter (HOSPITAL_BASED_OUTPATIENT_CLINIC_OR_DEPARTMENT_OTHER): Payer: Self-pay | Admitting: *Deleted

## 2021-08-03 ENCOUNTER — Emergency Department (HOSPITAL_BASED_OUTPATIENT_CLINIC_OR_DEPARTMENT_OTHER): Payer: 59

## 2021-08-03 ENCOUNTER — Other Ambulatory Visit: Payer: Self-pay

## 2021-08-03 DIAGNOSIS — I10 Essential (primary) hypertension: Secondary | ICD-10-CM | POA: Diagnosis not present

## 2021-08-03 DIAGNOSIS — R030 Elevated blood-pressure reading, without diagnosis of hypertension: Secondary | ICD-10-CM | POA: Diagnosis not present

## 2021-08-03 DIAGNOSIS — R600 Localized edema: Secondary | ICD-10-CM

## 2021-08-03 DIAGNOSIS — R609 Edema, unspecified: Secondary | ICD-10-CM

## 2021-08-03 DIAGNOSIS — R002 Palpitations: Secondary | ICD-10-CM

## 2021-08-03 DIAGNOSIS — D72829 Elevated white blood cell count, unspecified: Secondary | ICD-10-CM | POA: Insufficient documentation

## 2021-08-03 DIAGNOSIS — R0602 Shortness of breath: Secondary | ICD-10-CM | POA: Diagnosis not present

## 2021-08-03 DIAGNOSIS — E876 Hypokalemia: Secondary | ICD-10-CM | POA: Diagnosis not present

## 2021-08-03 DIAGNOSIS — R079 Chest pain, unspecified: Secondary | ICD-10-CM

## 2021-08-03 DIAGNOSIS — Z79899 Other long term (current) drug therapy: Secondary | ICD-10-CM | POA: Diagnosis not present

## 2021-08-03 LAB — CBC
HCT: 41.2 % (ref 36.0–46.0)
Hemoglobin: 13.8 g/dL (ref 12.0–15.0)
MCH: 28.6 pg (ref 26.0–34.0)
MCHC: 33.5 g/dL (ref 30.0–36.0)
MCV: 85.5 fL (ref 80.0–100.0)
Platelets: 350 10*3/uL (ref 150–400)
RBC: 4.82 MIL/uL (ref 3.87–5.11)
RDW: 12.7 % (ref 11.5–15.5)
WBC: 11.3 10*3/uL — ABNORMAL HIGH (ref 4.0–10.5)
nRBC: 0 % (ref 0.0–0.2)

## 2021-08-03 LAB — BASIC METABOLIC PANEL
Anion gap: 10 (ref 5–15)
BUN: 13 mg/dL (ref 6–20)
CO2: 22 mmol/L (ref 22–32)
Calcium: 9.3 mg/dL (ref 8.9–10.3)
Chloride: 106 mmol/L (ref 98–111)
Creatinine, Ser: 0.62 mg/dL (ref 0.44–1.00)
GFR, Estimated: >60 mL/min (ref >60)
Glucose, Bld: 101 mg/dL — ABNORMAL HIGH (ref 70–99)
Potassium: 3.3 mmol/L — ABNORMAL LOW (ref 3.5–5.1)
Sodium: 138 mmol/L (ref 135–145)

## 2021-08-03 LAB — BRAIN NATRIURETIC PEPTIDE: B Natriuretic Peptide: 23.1 pg/mL (ref 0.0–100.0)

## 2021-08-03 LAB — PREGNANCY, URINE: Preg Test, Ur: NEGATIVE

## 2021-08-03 LAB — D-DIMER, QUANTITATIVE: D-Dimer, Quant: <0.27 ug/mL-FEU (ref 0.00–0.50)

## 2021-08-03 LAB — TROPONIN I (HIGH SENSITIVITY): Troponin I (High Sensitivity): 4 ng/L (ref <18)

## 2021-08-03 MED ORDER — POTASSIUM CHLORIDE CRYS ER 20 MEQ PO TBCR
20.0000 meq | EXTENDED_RELEASE_TABLET | Freq: Once | ORAL | Status: AC
  Administered 2021-08-03: 20 meq via ORAL
  Filled 2021-08-03: qty 1

## 2021-08-03 NOTE — Discharge Instructions (Signed)
You were seen in the emergency department for continued shortness of breath and palpitations along with new elevated blood pressure and peripheral edema.  You had blood work and cardiac testing that did not show any significant abnormalities.  Your chest x-ray was clear.  Please continue your regular medications and follow-up with your cardiologist as scheduled.  Low-salt diet.  Return to the emergency department if any worsening or concerning symptoms

## 2021-08-03 NOTE — ED Provider Notes (Signed)
[facility] EMERGENCY DEPT Provider Note   CSN: [id number] Arrival date & time: 08/03/21  2049     History  Chief Complaint  Patient presents with   Hypertension    Cynthia Townsend is a 41 y.o. female.  She is here for 6 months of intermittent palpitations and shortness of breath.  More recently she has noticed her blood pressure has been elevated from her normal along with some new edema of her lower extremities.  She has an outpatient appointment with cardiology in a few days but called the telehealth line and they recommended she come here for evaluation.  She had a history of preeclampsia with her child 6 years ago and had some fluid overload at that time.  She feels like this is coming back on her again.  No recent change in her medications.  She sees Dr. Delton See from cardiology.  The history is provided by the patient.  Shortness of Breath Severity:  Moderate Onset quality:  Gradual Duration:  6 months Timing:  Intermittent Progression:  Worsening Chronicity:  Recurrent Relieved by:  Nothing Worsened by:  Activity Ineffective treatments:  None tried Associated symptoms: no abdominal pain, no chest pain, no cough, no fever, no headaches, no hemoptysis, no rash, no sore throat, no sputum production and no vomiting   Risk factors: no hx of PE/DVT and no prolonged immobilization       Home Medications Prior to Admission medications   Medication Sig Start Date End Date Taking? Authorizing Provider  albuterol (PROVENTIL HFA;VENTOLIN HFA) 108 (90 Base) MCG/ACT inhaler Inhale 2 puffs into the lungs every 6 (six) hours as needed for wheezing or shortness of breath. Patient taking differently: Inhale 2 puffs into the lungs every 4 (four) hours as needed for wheezing or shortness of breath.  04/01/17   Bobbitt, Heywood Iles, MD  ALPRAZolam Prudy Feeler) 0.5 MG tablet TAKE 1/2 TABLET BY MOUTH 3 TIMES A DAY AS NEEDED FOR ANXIETY 12/21/18   Corwin Levins, MD  amLODipine (NORVASC)  5 MG tablet Take 1 tablet (5 mg total) by mouth daily. Please make overdue appt with Dr. Delton See before anymore refills. 2nd attempt 02/08/19   Lars Masson, MD  amphetamine-dextroamphetamine (ADDERALL) 30 MG tablet Take 1 tablet by mouth 2 (two) times daily. 01/26/19   Corwin Levins, MD  butalbital-acetaminophen-caffeine (FIORICET) [phone] MG tablet TAKE 1 TABLET BY MOUTH EVERY 4 (FOUR) HOURS AS NEEDED FOR HEADACHE. ((NEED OV)) 01/26/19   Corwin Levins, MD  diphenhydrAMINE (BENADRYL) 25 MG tablet Take 75 mg by mouth at bedtime as needed for sleep.     [provider]  doxycycline (VIBRA-TABS) 100 MG tablet Take 1 tablet (100 mg total) by mouth 2 (two) times daily. 1 po bid 09/19/18   Bennie Pierini, FNP  furosemide (LASIX) 40 MG tablet Take 1 tablet (40 mg total) by mouth as needed (swelling or shortness of breath). 07/30/17 10/28/17  Lars Masson, MD  loratadine (CLARITIN) 10 MG tablet Take 10 mg by mouth daily as needed for allergies.    [provider]  predniSONE (STERAPRED UNI-PAK 21 TAB) 10 MG (21) TBPK tablet take 6 tablets by mouth daily for 1 day, then 5 daily for 1 day, then 4 daily for 1 day, then 3 daily for 1 day then 2 daily for 1 day then 1 daily for 1 day then off 07/16/21     sertraline (ZOLOFT) 100 MG tablet TAKE 1.5 TABLETS (150 MG TOTAL) BY MOUTH DAILY. 07/27/18  Corwin Levins, MD      Allergies    Penicillins and Sulfa antibiotics    Review of Systems   Review of Systems  Constitutional:  Negative for fever.  HENT:  Negative for sore throat.   Eyes:  Negative for visual disturbance.  Respiratory:  Positive for shortness of breath. Negative for cough, hemoptysis and sputum production.   Cardiovascular:  Positive for palpitations and leg swelling. Negative for chest pain.  Gastrointestinal:  Negative for abdominal pain and vomiting.  Musculoskeletal:  Negative for joint swelling.  Skin:  Negative for rash.  Neurological:  Negative for  headaches.   Physical Exam Updated Vital Signs BP (!) 140/93    Pulse 64    Temp 98.1 F (36.7 C) (Oral)    Resp 17    Ht 5\' 3"  (1.6 m)    Wt 86.2 kg    SpO2 100%    Breastfeeding No    BMI 33.66 kg/m  Physical Exam Vitals and nursing note reviewed.  Constitutional:      General: She is not in acute distress.    Appearance: Normal appearance. She is well-developed.  HENT:     Head: Normocephalic and atraumatic.  Eyes:     Conjunctiva/sclera: Conjunctivae normal.  Cardiovascular:     Rate and Rhythm: Normal rate and regular rhythm.     Heart sounds: No murmur heard. Pulmonary:     Effort: Pulmonary effort is normal. No respiratory distress.     Breath sounds: Normal breath sounds.  Abdominal:     Palpations: Abdomen is soft.     Tenderness: There is no abdominal tenderness.  Musculoskeletal:        General: No swelling or tenderness.     Cervical back: Neck supple.     Comments: She has possibly trace edema on her legs.  No cords appreciated  Skin:    General: Skin is warm and dry.     Capillary Refill: Capillary refill takes less than 2 seconds.  Neurological:     General: No focal deficit present.     Mental Status: She is alert.    ED Results / Procedures / Treatments   Labs (all labs ordered are listed, but only abnormal results are displayed) Labs Reviewed  BASIC METABOLIC PANEL - Abnormal; Notable for the following components:      Result Value   Potassium 3.3 (*)    Glucose, Bld 101 (*)    All other components within normal limits  CBC - Abnormal; Notable for the following components:   WBC 11.3 (*)    All other components within normal limits  PREGNANCY, URINE  BRAIN NATRIURETIC PEPTIDE  D-DIMER, QUANTITATIVE  TROPONIN I (HIGH SENSITIVITY)    EKG EKG Interpretation  Date/Time:  Friday August 03 2021 21:01:24 EST Ventricular Rate:  82 PR Interval:  124 QRS Duration: 88 QT Interval:  388 QTC Calculation: 453 R Axis:   62 Text  Interpretation: Normal sinus rhythm Normal ECG When compared with ECG of 26-Apr-2015 06:56, No significant change since last tracing Confirmed by 28-Apr-2015 [phone]) on 08/03/2021 10:17:52 PM  Radiology DG Chest Port 1 View  Result Date: 08/03/2021 CLINICAL DATA:  Shortness of breath EXAM: PORTABLE CHEST 1 VIEW COMPARISON:  06/24/2017 FINDINGS: The heart size and mediastinal contours are within normal limits. Both lungs are clear. The visualized skeletal structures are unremarkable. IMPRESSION: No active disease. Electronically Signed   By: 06/26/2017 M.D.   On: 08/03/2021 22:33  Procedures Procedures    Medications Ordered in ED Medications  potassium chloride SA (KLOR-CON M) CR tablet 20 mEq (20 mEq Oral Given 08/03/21 2306)    ED Course/ Medical Decision Making/ A&P Clinical Course as of 08/04/21 1007  Fri Aug 03, 2021  2238 Chest x-ray interpreted by me as normal heart size no acute infiltrates or congestive heart failure appreciated. [MB]    Clinical Course User Index [MB] Terrilee Files, MD                           Medical Decision Making Amount and/or Complexity of Data Reviewed Labs: ordered. Radiology: ordered.  Risk Prescription drug management.  This patient complains of shortness of breath palpitations edema of her legs; this involves an extensive number of treatment Options and is a complaint that carries with it a high risk of complications and morbidity. The differential includes CHF, pneumonia, PE, peripheral edema, renal failure, metabolic derangement  I ordered, reviewed and interpreted labs, which included CBC with mildly elevated white count normal hemoglobin, chemistries with mildly low potassium normal renal function, D-dimer negative, troponin negative, BNP normal, pregnancy test negative I ordered medication oral potassium and reviewed PMP when indicated. I ordered imaging studies which included chest x-ray and I independently    visualized  and interpreted imaging which showed no acute findings Previous records obtained and reviewed in epic including prior cardiology notes  Cardiac monitoring reviewed, patient in normal sinus rhythm Social determinants considered, no significant barriers  After the interventions stated above, I reevaluated the patient and found patient to be oxygenating well normal vitals.  No distress. Admission and further testing considered, no indications for further work-up at this time or admission.  Has an appointment with cardiology coming up next week.  Recommended continuing current medications.  Return instructions discussed          Final Clinical Impression(s) / ED Diagnoses Final diagnoses:  SOB (shortness of breath)  Palpitations  Primary hypertension  Peripheral edema    Rx / DC Orders ED Discharge Orders     None         Terrilee Files, MD 08/04/21 1011

## 2021-08-03 NOTE — ED Triage Notes (Signed)
Pt states that she has she has not been feeling herself and has had increasing sob as well as noted some swelling in her legs.  Pt had pulmonary edema after the birth of her child 6 years ago and feels that this feels similar.  Pt denies any CP with this and states that she is waiting to see her cardiologist but states that the symptoms became too much today and e-visit told her to come to ED.

## 2021-08-05 NOTE — Progress Notes (Deleted)
?Cardiology Office Note:   ? ?Date:  08/05/2021  ? ?ID:  Cynthia Townsend, DOB Nov 25, 1980, MRN [id number] ? ?PCP:  Wilfred Curtis, MD ?  ?[facility] Providers ?Cardiologist:  None {   ? ?Referring MD: Wilfred Curtis, MD  ? ? ?History of Present Illness:   ? ?Cynthia Townsend is a 41 y.o. female with a hx of gestational HTN, postpartum preeclampsia with pulmonary edema requiring diuresis, anxiety, and asthma who was previously followed by Dr. Delton See who was re-referred by Dr. Kathryne Gin for further evaluation of palpitations.  ? ?Today, *** ? ?Past Medical History:  ?Diagnosis Date  ? ADD (attention deficit disorder)   ? Anemia   ? post partum  ? Anemia requiring transfusions   ? of iron  ? Anxiety   ? Asthma   ? Blood transfusion without reported diagnosis   ? Carpal tunnel syndrome during pregnancy   ? Chicken pox   ? Classic migraine with aura 09/20/2015  ? De Quervain's disease (radial styloid tenosynovitis)   ? Depression   ? Ectopic pregnancy   ? Insomnia   ? Laceration of finger   ? left index with nerve involvement  ? Nasal polyps   ? denies  ? Pneumonia   ? Postpartum hemorrhage   ? Pre-eclampsia in postpartum period   ? Pregnancy induced hypertension   ? Pulmonary edema   ? Recurrent upper respiratory infection (URI)   ? Sciatic pain   ? Subclinical hypothyroidism   ? sub-clinical  ? Urticaria   ? dogs  ? Vitamin D deficiency   ? ? ?Past Surgical History:  ?Procedure Laterality Date  ? BREAST BIOPSY Right   ? benign  punch bx  ? DILATION AND CURETTAGE OF UTERUS    ? DILATION AND EVACUATION N/A 09/04/2015  ? Procedure: DILATATION AND EVACUATION;  Surgeon: Mitchel Honour, DO;  Location: [facility];  Service: Gynecology;  Laterality: N/A;  ? DNC    ? I & D EXTREMITY Left 04/10/2017  ? Procedure: LEFT INDEX FINGER WOUND REPAIR AND NERVE REPAIR;  Surgeon: Bradly Bienenstock, MD;  Location: [facility] OR;  Service: Orthopedics;  Laterality: Left;  ? ? ?Current Medications: ?No outpatient medications have been marked as taking for the 08/08/21  encounter (Appointment) with Meriam Sprague, MD.  ?  ? ?Allergies:   Penicillins and Sulfa antibiotics  ? ?Social History  ? ?Socioeconomic History  ? Marital status: Single  ?  Spouse name: Not on file  ? Number of children: 0  ? Years of education: 32  ? Highest education level: Not on file  ?Occupational History  ? Occupation: CMA  ?  Comment: Heart Care  ?Tobacco Use  ? Smoking status: Former  ?  Types: Cigarettes  ? Smokeless tobacco: Never  ?Vaping Use  ? Vaping Use: Every day  ?Substance and Sexual Activity  ? Alcohol use: No  ? Drug use: No  ? Sexual activity: Yes  ?  Birth control/protection: I.U.D.  ?Other Topics Concern  ? Not on file  ?Social History Narrative  ? Lives at home w/ her fiance and new daughter.  ? Right-handed.  ? Drinks about 1-2 cups of caffeine per day.  ? Fun: Hiking, Zumba, sleep  ? Denies religious beliefs effecting health care.  ? Denies abuse and feels safe at home.   ? ?Social Determinants of Health  ? ?Financial Resource Strain: Not on file  ?Food Insecurity: Not on file  ?Transportation Needs: Not on file  ?Physical Activity: Not  on file  ?Stress: Not on file  ?Social Connections: Not on file  ?  ? ?Family History: ?The patient's ***family history includes AAA (abdominal aortic aneurysm) in her father; Alcohol abuse in her mother; COPD in her mother; Cancer in her sister; Colon cancer in her paternal uncle; Diabetes in her sister; Heart attack in her father and sister; Heart attack (age of onset: 70) in her maternal grandfather; Heart attack (age of onset: 46) in her sister; Heart disease in her father, mother, and sister; Heart disease (age of onset: 24) in her maternal uncle; Hyperlipidemia in her father and mother; Hyperthyroidism in her mother; Lung cancer in her mother; Mental illness in her mother and sister; Pulmonary embolism in her father; Rheum arthritis in her maternal grandmother; Transient ischemic attack in her father. ? ?ROS:   ?Please see the history of  present illness.    ?*** All other systems reviewed and are negative. ? ?EKGs/Labs/Other Studies Reviewed:   ? ?The following studies were reviewed today: ?TTE 07/2017: ?Study Conclusions  ? ?- Left ventricle: The cavity size was normal. Wall thickness was  ?  normal. The estimated ejection fraction was 55%. Wall motion was  ?  normal; there were no regional wall motion abnormalities. Left  ?  ventricular diastolic function parameters were normal. Global  ?  strain was only -15%, but strain images were difficult and  ?  probably not accurate.  ?- Aortic valve: Aortic valve was not visualized in short axis.  ?  There was no stenosis.  ?- Mitral valve: There was no regurgitation.  ?- Right ventricle: The cavity size was normal. Systolic function  ?  was normal.  ?- Pulmonary arteries: No complete TR doppler jet so unable to  ?  estimate PA systolic pressure.  ?- Inferior vena cava: The vessel was normal in size. The  ?  respirophasic diameter changes were in the normal range (= 50%),  ?  consistent with normal central venous pressure.  ? ?EKG:  EKG is *** ordered today.  The ekg ordered today demonstrates *** ? ?Recent Labs: ?08/03/2021: B Natriuretic Peptide 23.1; BUN 13; Creatinine, Ser 0.62; Hemoglobin 13.8; Platelets 350; Potassium 3.3; Sodium 138  ?Recent Lipid Panel ?No results found for: CHOL, TRIG, HDL, CHOLHDL, VLDL, LDLCALC, LDLDIRECT ? ? ?Risk Assessment/Calculations:   ?{Does this patient have ATRIAL FIBRILLATION?[phone]} ? ?    ? ?Physical Exam:   ? ?VS:  There were no vitals taken for this visit.   ? ?Wt Readings from Last 3 Encounters:  ?08/03/21 190 lb (86.2 kg)  ?06/26/17 230 lb (104.3 kg)  ?06/26/17 230 lb (104.3 kg)  ?  ? ?GEN: *** Well nourished, well developed in no acute distress ?HEENT: Normal ?NECK: No JVD; No carotid bruits ?LYMPHATICS: No lymphadenopathy ?CARDIAC: ***RRR, no murmurs, rubs, gallops ?RESPIRATORY:  Clear to auscultation without rales, wheezing or rhonchi  ?ABDOMEN: Soft,  non-tender, non-distended ?MUSCULOSKELETAL:  No edema; No deformity  ?SKIN: Warm and dry ?NEUROLOGIC:  Alert and oriented x 3 ?PSYCHIATRIC:  Normal affect  ? ?ASSESSMENT:   ? ?No diagnosis found. ?PLAN:   ? ?In order of problems listed above: ? ?#Palpitations: ?-Check zio monitor ? ?#History of Pre-eclampsia: ?#History of Gestational HTN: ?Higher risk of CV disease. Will need continued surveillance and monitoring. ? ?   ? ?{Are you ordering a CV Procedure (e.g. stress test, cath, DCCV, TEE, etc)?   Press F2        :[id number]}  ? ? ?Medication Adjustments/Labs and  Tests Ordered: ?Current medicines are reviewed at length with the patient today.  Concerns regarding medicines are outlined above.  ?No orders of the defined types were placed in this encounter. ? ?No orders of the defined types were placed in this encounter. ? ? ?There are no Patient Instructions on file for this visit.  ? ?Signed, ?Meriam Sprague, MD  ?08/05/2021 8:03 PM    ?[facility] ?

## 2021-08-08 ENCOUNTER — Ambulatory Visit (INDEPENDENT_AMBULATORY_CARE_PROVIDER_SITE_OTHER): Payer: 59

## 2021-08-08 ENCOUNTER — Encounter: Payer: Self-pay | Admitting: Cardiology

## 2021-08-08 ENCOUNTER — Ambulatory Visit (INDEPENDENT_AMBULATORY_CARE_PROVIDER_SITE_OTHER): Payer: Medicaid Other | Admitting: Cardiology

## 2021-08-08 ENCOUNTER — Other Ambulatory Visit (HOSPITAL_COMMUNITY): Payer: Self-pay

## 2021-08-08 ENCOUNTER — Other Ambulatory Visit: Payer: Self-pay

## 2021-08-08 VITALS — BP 130/100 | HR 79 | Ht 63.0 in | Wt 190.6 lb

## 2021-08-08 DIAGNOSIS — R0609 Other forms of dyspnea: Secondary | ICD-10-CM

## 2021-08-08 DIAGNOSIS — E669 Obesity, unspecified: Secondary | ICD-10-CM | POA: Diagnosis not present

## 2021-08-08 DIAGNOSIS — R002 Palpitations: Secondary | ICD-10-CM | POA: Diagnosis not present

## 2021-08-08 DIAGNOSIS — R600 Localized edema: Secondary | ICD-10-CM

## 2021-08-08 DIAGNOSIS — R072 Precordial pain: Secondary | ICD-10-CM

## 2021-08-08 DIAGNOSIS — R0602 Shortness of breath: Secondary | ICD-10-CM

## 2021-08-08 MED ORDER — FUROSEMIDE 20 MG PO TABS
20.0000 mg | ORAL_TABLET | ORAL | 1 refills | Status: DC
  Filled 2021-08-08: qty 24, 84d supply, fill #0

## 2021-08-08 MED ORDER — METOPROLOL TARTRATE 100 MG PO TABS
100.0000 mg | ORAL_TABLET | Freq: Once | ORAL | 0 refills | Status: DC
  Filled 2021-08-08: qty 1, 1d supply, fill #0

## 2021-08-08 MED ORDER — POTASSIUM CHLORIDE CRYS ER 20 MEQ PO TBCR
20.0000 meq | EXTENDED_RELEASE_TABLET | ORAL | 1 refills | Status: DC
  Filled 2021-08-08: qty 24, 84d supply, fill #0

## 2021-08-08 NOTE — Patient Instructions (Signed)
Medication Instructions:   START TAKING LASIX 20 MG BY MOUTH TWICE WEEKLY ONLY AS NEEDED FOR LOWER EXTREMITY SWELLING  START TAKING POTASSIUM CHLORIDE 20 mEq BY MOUTH TWICE WEEKLY AS NEEDED WHEN YOU TAKE YOUR PRN LASIX  *If you need a refill on your cardiac medications before your next appointment, please call your pharmacy*   Lab Work:  TODAY--BMET  If you have labs (blood work) drawn today and your tests are completely normal, you will receive your results only by: MyChart Message (if you have MyChart) OR A paper copy in the mail If you have any lab test that is abnormal or we need to change your treatment, we will call you to review the results.   Testing/Procedures:  Your physician has requested that you have an echocardiogram. Echocardiography is a painless test that uses sound waves to create images of your heart. It provides your doctor with information about the size and shape of your heart and how well your hearts chambers and valves are working. This procedure takes approximately one hour. There are no restrictions for this procedure.    ZIO XT- Long Term Monitor Instructions  Your physician has requested you wear a ZIO patch monitor for 7 days.  This is a single patch monitor. [employer] supplies one patch monitor per enrollment. Additional stickers are not available. Please do not apply patch if you will be having a Nuclear Stress Test,  Echocardiogram, Cardiac CT, MRI, or Chest Xray during the period you would be wearing the  monitor. The patch cannot be worn during these tests. You cannot remove and re-apply the  ZIO XT patch monitor.  Your ZIO patch monitor will be mailed 3 day USPS to your address on file. It may take 3-5 days  to receive your monitor after you have been enrolled.  Once you have received your monitor, please review the enclosed instructions. Your monitor  has already been registered assigning a specific monitor serial # to you.  Billing and Patient  Assistance Program Information  We have supplied [employer] with any of your insurance information on file for billing purposes. [employer] offers a sliding scale Patient Assistance Program for patients that do not have  insurance, or whose insurance does not completely cover the cost of the ZIO monitor.  You must apply for the Patient Assistance Program to qualify for this discounted rate.  To apply, please call [employer] at [phone], select option 4, select option 2, ask to apply for  Patient Assistance Program. Meredeth Ide will ask your household income, and how many people  are in your household. They will quote your out-of-pocket cost based on that information.  [employer] will also be able to set up a 30-month, interest-free payment plan if needed.  Applying the monitor   Shave hair from upper left chest.  Hold abrader disc by orange tab. Rub abrader in 40 strokes over the upper left chest as  indicated in your monitor instructions.  Clean area with 4 enclosed alcohol pads. Let dry.  Apply patch as indicated in monitor instructions. Patch will be placed under collarbone on left  side of chest with arrow pointing upward.  Rub patch adhesive wings for 2 minutes. Remove white label marked "1". Remove the white  label marked "2". Rub patch adhesive wings for 2 additional minutes.  While looking in a mirror, press and release button in center of patch. A small green light will  flash 3-4 times. This will be your only indicator that the monitor has  been turned on.  Do not shower for the first 24 hours. You may shower after the first 24 hours.  Press the button if you feel a symptom. You will hear a small click. Record Date, Time and  Symptom in the Patient Logbook.  When you are ready to remove the patch, follow instructions on the last 2 pages of Patient  Logbook. Stick patch monitor onto the last page of Patient Logbook.  Place Patient Logbook in the blue and white box. Use locking tab on box and  tape box closed  securely. The blue and white box has prepaid postage on it. Please place it in the mailbox as  soon as possible. Your physician should have your test results approximately 7 days after the  monitor has been mailed back to [facility].  Call [facility] Customer Care at [phone] if you have questions regarding  your ZIO XT patch monitor. Call them immediately if you see an orange light blinking on your  monitor.  If your monitor falls off in less than 4 days, contact our Monitor department at [phone].  If your monitor becomes loose or falls off after 4 days call [employer] at [phone] for  suggestions on securing your monitor    Your cardiac CT will be scheduled at one of the below locations:   [facility] [address] [place], [place] [address] [phone]  If scheduled at [facility], please arrive at the [facility] and Children's Entrance (Entrance C2) of [facility] 30 minutes prior to test start time. You can use the FREE valet parking offered at entrance C (encouraged to control the heart rate for the test)  Proceed to the [facility] Radiology Department (first floor) to check-in and test prep.  All radiology patients and guests should use entrance C2 at [facility], accessed from [facility], even though the hospital's physical address listed is [address]     Please follow these instructions carefully (unless otherwise directed):   On the Night Before the Test: Be sure to Drink plenty of water. Do not consume any caffeinated/decaffeinated beverages or chocolate 12 hours prior to your test. Do not take any antihistamines 12 hours prior to your test.=  On the Day of the Test: Drink plenty of water until 1 hour prior to the test. Do not eat any food 4 hours prior to the test. You may take your regular medications prior to the test.  Take metoprolol 100 MG BY MOUTH  (Lopressor) two hours prior to test. HOLD Furosemide  morning of the test. FEMALES- please wear underwire-free bra if available, avoid dresses & tight clothing  After the Test: Drink plenty of water. After receiving IV contrast, you may experience a mild flushed feeling. This is normal. On occasion, you may experience a mild rash up to 24 hours after the test. This is not dangerous. If this occurs, you can take Benadryl 25 mg and increase your fluid intake. If you experience trouble breathing, this can be serious. If it is severe call 911 IMMEDIATELY. If it is mild, please call our office.  We will call to schedule your test 2-4 weeks out understanding that some insurance companies will need an authorization prior to the service being performed.   For non-scheduling related questions, please contact the cardiac imaging nurse navigator should you have any questions/concerns: Rockwell Alexandria, Cardiac Imaging Nurse Navigator Larey Brick, Cardiac Imaging Nurse Navigator [facility] Direct  Office Dial: [phone]   For scheduling needs, including cancellations and rescheduling, please call [place], [phone].   Follow-Up: At [facility], you and your health needs are our priority.  As part of our continuing mission to provide you with exceptional heart care, we have created designated Provider Care Teams.  These Care Teams include your primary Cardiologist (physician) and Advanced Practice Providers (APPs -  Physician Assistants and Nurse Practitioners) who all work together to provide you with the care you need, when you need it.  We recommend signing up for the patient portal called "MyChart".  Sign up information is provided on this After Visit Summary.  MyChart is used to connect with patients for Virtual Visits (Telemedicine).  Patients are able to view lab/test results, encounter notes, upcoming appointments, etc.  Non-urgent messages can be sent to your  provider as well.   To learn more about what you can do with MyChart, go to [email or web address].    Your next appointment:   6 month(s)  The format for your next appointment:   In Person  Provider:   DR. Shari Prows

## 2021-08-08 NOTE — Progress Notes (Unsigned)
Applied a 7 day Zio XT monitor on patient in the office ?

## 2021-08-08 NOTE — Progress Notes (Signed)
Cardiology Office Note:    Date:  08/08/2021   ID:  [name], DOB 02-15-1981, MRN [id number]  PCP:  Margarito Courser, MD   [facility] Providers Cardiologist:  None {    Referring MD: Margarito Courser, MD    History of Present Illness:    Cynthia Townsend is a 41 y.o. female with a hx of gestational HTN, postpartum preeclampsia with pulmonary edema requiring diuresis, anxiety, and asthma who was previously followed by Dr. Meda Coffee who was re-referred by Dr. Birdena Crandall for further evaluation of palpitations.   Today, the patient states she has not been feeling very well. She has been having episodes of frequent palpitations. The symptoms can occur almost every other day and are worse when laying down at night.  She tried the EKG function on her friend's smart watch and was told she had Afib 2 of the 3 times she tried. Has associated SOB, but no lightheadedness or syncope. Notably she is on adderrall and has been on this for years.   She also reports shortness of breath with exertion. She believes the shortness of breath may be related to her weight. However, the shortness of breath has progressively worsened and she can now feel more short winded with simple exertion or with talking.   She has occasional chest pain in her L to central chest she describes at squeezing or pinching. However, the chest pain occurs randomly, regardless of exertion.   Recently, she also noticed pitting edema in her bilateral legs. Her boyfriend convinced her to present to the ED given symptoms. There, trop negative, BNP normal and d-dimer negative. She was discharged home with plans to follow-up here.   She endorses a family history of cardiovascular disease. Both her mother and father had hypertension, hyperlipidemia, CAD, stents, and MI at an early age. Her sister, maternal uncle, and maternal grandfather also had heart disease.   She has seen a bariatric specialist, nutritionist, and tried the keto diet. She endorses  a history of bulimia lasting 30 years starting when she was 41 years old. She has since recovered after having children. However, after pregnancy, she has trouble losing weight and wonders if she is a good candidate for Ozempic or similar medications.   The patient denies PND, or orthopnea. Denies cough, fever, chills. Denies nausea, vomiting. Denies syncope or presyncope. Denies snoring.  Past Medical History:  Diagnosis Date   ADD (attention deficit disorder)    Anemia    post partum   Anemia requiring transfusions    of iron   Anxiety    Asthma    Blood transfusion without reported diagnosis    Carpal tunnel syndrome during pregnancy    Chicken pox    Classic migraine with aura 09/20/2015   Harriet Pho disease (radial styloid tenosynovitis)    Depression    Ectopic pregnancy    Insomnia    Laceration of finger    left index with nerve involvement   Nasal polyps    denies   Pneumonia    Postpartum hemorrhage    Pre-eclampsia in postpartum period    Pregnancy induced hypertension    Pulmonary edema    Recurrent upper respiratory infection (URI)    Sciatic pain    Subclinical hypothyroidism    sub-clinical   Urticaria    dogs   Vitamin D deficiency     Past Surgical History:  Procedure Laterality Date   BREAST BIOPSY Right    benign  punch bx   DILATION  AND CURETTAGE OF UTERUS     DILATION AND EVACUATION N/A 09/04/2015   Procedure: DILATATION AND EVACUATION;  Surgeon: Linda Hedges, DO;  Location: [facility];  Service: Gynecology;  Laterality: N/A;   DNC     I & D EXTREMITY Left 04/10/2017   Procedure: LEFT INDEX FINGER WOUND REPAIR AND NERVE REPAIR;  Surgeon: Iran Planas, MD;  Location: [facility];  Service: Orthopedics;  Laterality: Left;    Current Medications: Current Meds  Medication Sig   albuterol (PROVENTIL HFA;VENTOLIN HFA) 108 (90 Base) MCG/ACT inhaler Inhale 2 puffs into the lungs every 6 (six) hours as needed for wheezing or shortness of breath. (Patient taking  differently: Inhale 2 puffs into the lungs every 4 (four) hours as needed for wheezing or shortness of breath.)   ALPRAZolam (XANAX) 0.5 MG tablet TAKE 1/2 TABLET BY MOUTH 3 TIMES A DAY AS NEEDED FOR ANXIETY   amLODipine (NORVASC) 5 MG tablet Take 1 tablet (5 mg total) by mouth daily. Please make overdue appt with Dr. Meda Coffee before anymore refills. 2nd attempt   amphetamine-dextroamphetamine (ADDERALL) 30 MG tablet Take 1 tablet by mouth 2 (two) times daily.   baclofen (LIORESAL) 10 MG tablet Take 10 mg by mouth 2 (two) times daily as needed.   Cholecalciferol 125 MCG (5000 UT) capsule    Ferrous Sulfate (IRON) 325 (65 Fe) MG TABS    furosemide (LASIX) 20 MG tablet Take 1 tablet (20 mg total) by mouth 2 (two) times a week as needed for lower extremity swelling   Ibuprofen 200 MG CAPS    levonorgestrel (MIRENA) 20 MCG/DAY IUD by Intrauterine route.   levothyroxine (SYNTHROID) 75 MCG tablet Take 1 tablet by mouth daily.   loratadine (CLARITIN) 10 MG tablet Take 10 mg by mouth daily as needed for allergies.   metoprolol tartrate (LOPRESSOR) 100 MG tablet Take 1 tablet (100 mg total) by mouth once for 1 dose. Take 90-120 minutes prior to scan.   [START ON 08/09/2021] potassium chloride SA (KLOR-CON M) 20 MEQ tablet Take 1 tablet (20 mEq total) by mouth twice weekly as needed when you take furosemide   sertraline (ZOLOFT) 100 MG tablet TAKE 1.5 TABLETS (150 MG TOTAL) BY MOUTH DAILY.   topiramate (TOPAMAX) 100 MG tablet Take 1 tablet by mouth 2 (two) times daily.   traZODone (DESYREL) 150 MG tablet SMARTSIG:1-1.5 Tablet(s) By Mouth Every Evening   VRAYLAR 1.5 MG capsule SMARTSIG:1 Capsule(s) By Mouth Every Evening     Allergies:   Penicillins and Sulfa antibiotics   Social History   Socioeconomic History   Marital status: Single    Spouse name: Not on file   Number of children: 0   Years of education: 16   Highest education level: Not on file  Occupational History   Occupation: CMA     Comment: Heart Care  Tobacco Use   Smoking status: Former    Types: Cigarettes   Smokeless tobacco: Never  Vaping Use   Vaping Use: Every day  Substance and Sexual Activity   Alcohol use: No   Drug use: No   Sexual activity: Yes    Birth control/protection: I.U.D.  Other Topics Concern   Not on file  Social History Narrative   Lives at home w/ her fiance and new daughter.   Right-handed.   Drinks about 1-2 cups of caffeine per day.   Fun: Hiking, Zumba, sleep   Denies religious beliefs effecting health care.   Denies abuse and feels safe at home.  Social Determinants of Health   Financial Resource Strain: Not on file  Food Insecurity: Not on file  Transportation Needs: Not on file  Physical Activity: Not on file  Stress: Not on file  Social Connections: Not on file     Family History: The patient's family history includes AAA (abdominal aortic aneurysm) in her father; Alcohol abuse in her mother; COPD in her mother; Cancer in her sister; Colon cancer in her paternal uncle; Diabetes in her sister; Heart attack in her father and sister; Heart attack (age of onset: 28) in her maternal grandfather; Heart attack (age of onset: 69) in her sister; Heart disease in her father, mother, and sister; Heart disease (age of onset: 29) in her maternal uncle; Hyperlipidemia in her father and mother; Hyperthyroidism in her mother; Lung cancer in her mother; Mental illness in her mother and sister; Pulmonary embolism in her father; Rheum arthritis in her maternal grandmother; Transient ischemic attack in her father.  ROS:   Please see the history of present illness.    Review of Systems  Constitutional:  Positive for malaise/fatigue. Negative for weight loss.  HENT:  Negative for congestion and sore throat.   Eyes:  Negative for blurred vision and pain.  Respiratory:  Positive for shortness of breath. Negative for cough.   Cardiovascular:  Positive for chest pain, palpitations and leg  swelling. Negative for orthopnea, claudication and PND.  Gastrointestinal:  Negative for heartburn and nausea.  Genitourinary:  Negative for frequency and urgency.  Musculoskeletal:  Negative for joint pain and myalgias.  Skin:  Negative for itching and rash.  Neurological:  Positive for dizziness. Negative for headaches.  Endo/Heme/Allergies:  Does not bruise/bleed easily.  Psychiatric/Behavioral:  The patient is nervous/anxious. The patient does not have insomnia.   All other systems reviewed and are negative.  EKGs/Labs/Other Studies Reviewed:    The following studies were reviewed today: TTE 08-14-2017: Study Conclusions  - Left ventricle: The cavity size was normal. Wall thickness was    normal. The estimated ejection fraction was 55%. Wall motion was    normal; there were no regional wall motion abnormalities. Left    ventricular diastolic function parameters were normal. Global    strain was only -15%, but strain images were difficult and    probably not accurate.  - Aortic valve: Aortic valve was not visualized in short axis.    There was no stenosis.  - Mitral valve: There was no regurgitation.  - Right ventricle: The cavity size was normal. Systolic function    was normal.  - Pulmonary arteries: No complete TR doppler jet so unable to    estimate PA systolic pressure.  - Inferior vena cava: The vessel was normal in size. The    respirophasic diameter changes were in the normal range (= 50%),    consistent with normal central venous pressure.   EKG:   08/08/21: Sinus rhythm, rate 79 bpm  Recent Labs: 08/03/2021: B Natriuretic Peptide 23.1; BUN 13; Creatinine, Ser 0.62; Hemoglobin 13.8; Platelets 350; Potassium 3.3; Sodium 138  Recent Lipid Panel No results found for: CHOL, TRIG, HDL, CHOLHDL, VLDL, LDLCALC, LDLDIRECT          Physical Exam:    VS:  BP (!) 130/100    Pulse 79    Ht 5\' 3"  (1.6 m)    Wt 190 lb 9.6 oz (86.5 kg)    SpO2 99%    BMI 33.76 kg/m     Wt  Readings from Last  3 Encounters:  08/08/21 190 lb 9.6 oz (86.5 kg)  08/03/21 190 lb (86.2 kg)  11/18/17 204 lb (92.5 kg)     GEN: Well nourished, well developed in no acute distress HEENT: Normal NECK: No JVD; No carotid bruits CARDIAC: RRR, 1/6 systolic murmur, no rubs or gallops RESPIRATORY:  Clear to auscultation without rales, wheezing or rhonchi  ABDOMEN: Soft, non-tender, non-distended MUSCULOSKELETAL:  1+ pitting edema; No deformity  SKIN: Warm and dry NEUROLOGIC:  Alert and oriented x 3 PSYCHIATRIC:  Normal affect   ASSESSMENT:    1. Dyspnea on exertion   2. Precordial pain   3. SOB (shortness of breath) on exertion   4. Bilateral lower extremity edema   5. Palpitations   6. Obesity (BMI 30.0-34.9)    PLAN:    In order of problems listed above:  #Dyspnea on Exertion: Patient with progressive dyspnea on exertion over the past several months in the setting of strong family history of CAD as well as risk factors such as pre-eclampsia and HTN. Will check coronary CTA as well as TTE for further work-up. -Check coronary CTA -Check TTE  #Palpitations: Patient with frequent episodes of palpitations occurring about every other day. Was notified by apple watch that she may have Afib. Symptoms may be contributing to worsening dyspnea as above. Will check zio and TTE.  -Check zio monitor -Check TTE as above  #LE Edema: Recently seen in the ER for worsening LE edema. Work-up including trop, d-dimer and BNP normal. Continues to have mild LE edema on exam today. Will pursue work-up as detailed above. -Check TTE -Start lasix 20mg  prn for LE edema  -Will supplement with 72mEq K  #Obesity: BMI 33. Has tried several diets without success. Remains active. -Will look into GLP-1 coverage -Continue lifestyle modifications  #History of Pre-eclampsia: #History of Gestational HTN: Higher risk for CV complications      Exercise recommendations: Goal of exercising for at least 30  minutes a day, at least 5 times per week.  Please exercise to a moderate exertion.  This means that while exercising it is difficult to speak in full sentences, however you are not so short of breath that you feel you must stop, and not so comfortable that you can carry on a full conversation.  Exertion level should be approximately a 5/10, if 10 is the most exertion you can perform.  Diet recommendations: Recommend a heart healthy diet such as the Mediterranean diet.  This diet consists of plant based foods, healthy fats, lean meats, olive oil.  It suggests limiting the intake of simple carbohydrates such as white breads, pastries, and pastas.  It also limits the amount of red meat, wine, and dairy products such as cheese that one should consume on a daily basis.     Medication Adjustments/Labs and Tests Ordered: Current medicines are reviewed at length with the patient today.  Concerns regarding medicines are outlined above.  Orders Placed This Encounter  Procedures   CT CORONARY MORPH W/CTA COR W/SCORE W/CA W/CM &/OR WO/CM   Basic metabolic panel   LONG TERM MONITOR (3-14 DAYS)   EKG 12-Lead   ECHOCARDIOGRAM COMPLETE   Meds ordered this encounter  Medications   metoprolol tartrate (LOPRESSOR) 100 MG tablet    Sig: Take 1 tablet (100 mg total) by mouth once for 1 dose. Take 90-120 minutes prior to scan.    Dispense:  1 tablet    Refill:  0   furosemide (LASIX) 20 MG tablet  Sig: Take 1 tablet (20 mg total) by mouth 2 (two) times a week as needed for lower extremity swelling    Dispense:  30 tablet    Refill:  1   potassium chloride SA (KLOR-CON M) 20 MEQ tablet    Sig: Take 1 tablet (20 mEq total) by mouth twice weekly as needed when you take furosemide    Dispense:  30 tablet    Refill:  1    Patient Instructions  Medication Instructions:   START TAKING LASIX 20 MG BY MOUTH TWICE WEEKLY ONLY AS NEEDED FOR LOWER EXTREMITY SWELLING  START TAKING POTASSIUM CHLORIDE 20 mEq BY  MOUTH TWICE WEEKLY AS NEEDED WHEN YOU TAKE YOUR PRN LASIX  *If you need a refill on your cardiac medications before your next appointment, please call your pharmacy*   Lab Work:  TODAY--BMET  If you have labs (blood work) drawn today and your tests are completely normal, you will receive your results only by: MyChart Message (if you have MyChart) OR A paper copy in the mail If you have any lab test that is abnormal or we need to change your treatment, we will call you to review the results.   Testing/Procedures:  Your physician has requested that you have an echocardiogram. Echocardiography is a painless test that uses sound waves to create images of your heart. It provides your doctor with information about the size and shape of your heart and how well your hearts chambers and valves are working. This procedure takes approximately one hour. There are no restrictions for this procedure.    ZIO XT- Long Term Monitor Instructions  Your physician has requested you wear a ZIO patch monitor for 7 days.  This is a single patch monitor. [employer] supplies one patch monitor per enrollment. Additional stickers are not available. Please do not apply patch if you will be having a Nuclear Stress Test,  Echocardiogram, Cardiac CT, MRI, or Chest Xray during the period you would be wearing the  monitor. The patch cannot be worn during these tests. You cannot remove and re-apply the  ZIO XT patch monitor.  Your ZIO patch monitor will be mailed 3 day USPS to your address on file. It may take 3-5 days  to receive your monitor after you have been enrolled.  Once you have received your monitor, please review the enclosed instructions. Your monitor  has already been registered assigning a specific monitor serial # to you.  Billing and Patient Assistance Program Information  We have supplied [employer] with any of your insurance information on file for billing purposes. [employer] offers a sliding scale  Patient Assistance Program for patients that do not have  insurance, or whose insurance does not completely cover the cost of the ZIO monitor.  You must apply for the Patient Assistance Program to qualify for this discounted rate.  To apply, please call [employer] at [phone], select option 4, select option 2, ask to apply for  Patient Assistance Program. Theodore Demark will ask your household income, and how many people  are in your household. They will quote your out-of-pocket cost based on that information.  [employer] will also be able to set up a 52-month, interest-free payment plan if needed.  Applying the monitor   Shave hair from upper left chest.  Hold abrader disc by orange tab. Rub abrader in 40 strokes over the upper left chest as  indicated in your monitor instructions.  Clean area with 4 enclosed alcohol pads. Let dry.  Apply  patch as indicated in monitor instructions. Patch will be placed under collarbone on left  side of chest with arrow pointing upward.  Rub patch adhesive wings for 2 minutes. Remove white label marked "1". Remove the white  label marked "2". Rub patch adhesive wings for 2 additional minutes.  While looking in a mirror, press and release button in center of patch. A small green light will  flash 3-4 times. This will be your only indicator that the monitor has been turned on.  Do not shower for the first 24 hours. You may shower after the first 24 hours.  Press the button if you feel a symptom. You will hear a small click. Record Date, Time and  Symptom in the Patient Logbook.  When you are ready to remove the patch, follow instructions on the last 2 pages of Patient  Logbook. Stick patch monitor onto the last page of Patient Logbook.  Place Patient Logbook in the blue and white box. Use locking tab on box and tape box closed  securely. The blue and white box has prepaid postage on it. Please place it in the mailbox as  soon as possible. Your physician should have  your test results approximately 7 days after the  monitor has been mailed back to [facility].  Call [place] at [phone] if you have questions regarding  your ZIO XT patch monitor. Call them immediately if you see an orange light blinking on your  monitor.  If your monitor falls off in less than 4 days, contact our Monitor department at [phone].  If your monitor becomes loose or falls off after 4 days call [employer] at [phone] for  suggestions on securing your monitor    Your cardiac CT will be scheduled at one of the below locations:   [facility] [address] [place], [place] [address] [phone]  If scheduled at [facility], please arrive at the [facility] and Children's Entrance (Entrance C2) of [facility] 30 minutes prior to test start time. You can use the FREE valet parking offered at entrance C (encouraged to control the heart rate for the test)  Proceed to the [facility] Radiology Department (first floor) to check-in and test prep.  All radiology patients and guests should use entrance C2 at [facility], accessed from [facility], even though the hospital's physical address listed is [address]     Please follow these instructions carefully (unless otherwise directed):   On the Night Before the Test: Be sure to Drink plenty of water. Do not consume any caffeinated/decaffeinated beverages or chocolate 12 hours prior to your test. Do not take any antihistamines 12 hours prior to your test.=  On the Day of the Test: Drink plenty of water until 1 hour prior to the test. Do not eat any food 4 hours prior to the test. You may take your regular medications prior to the test.  Take metoprolol 100 MG BY MOUTH (Lopressor) two hours prior to test. HOLD Furosemide  morning of the test. FEMALES- please wear underwire-free bra if available, avoid dresses & tight  clothing  After the Test: Drink plenty of water. After receiving IV contrast, you may experience a mild flushed feeling. This is normal. On occasion, you may experience a mild rash up to 24 hours after the test. This is not dangerous. If this occurs, you can take Benadryl 25 mg and increase your fluid intake. If you experience trouble  breathing, this can be serious. If it is severe call 911 IMMEDIATELY. If it is mild, please call our office.  We will call to schedule your test 2-4 weeks out understanding that some insurance companies will need an authorization prior to the service being performed.   For non-scheduling related questions, please contact the cardiac imaging nurse navigator should you have any questions/concerns: Marchia Bond, Cardiac Imaging Nurse Navigator Gordy Clement, Cardiac Imaging Nurse Navigator [facility] Direct Office Dial: [phone]   For scheduling needs, including cancellations and rescheduling, please call [place], [phone].   Follow-Up: At [facility], you and your health needs are our priority.  As part of our continuing mission to provide you with exceptional heart care, we have created designated Provider Care Teams.  These Care Teams include your primary Cardiologist (physician) and Advanced Practice Providers (APPs -  Physician Assistants and Nurse Practitioners) who all work together to provide you with the care you need, when you need it.  We recommend signing up for the patient portal called "MyChart".  Sign up information is provided on this After Visit Summary.  MyChart is used to connect with patients for Virtual Visits (Telemedicine).  Patients are able to view lab/test results, encounter notes, upcoming appointments, etc.  Non-urgent messages can be sent to your provider as well.   To learn more about what you can do with MyChart, go to [email or web address].    Your next appointment:   6 month(s)  The  format for your next appointment:   In Person  Provider:   DR. Reece Leader as a scribe for Freada Bergeron, MD.,have documented all relevant documentation on the behalf of Freada Bergeron, MD,as directed by  Freada Bergeron, MD while in the presence of Freada Bergeron, MD.  I, Freada Bergeron, MD, have reviewed all documentation for this visit. The documentation on 08/08/21 for the exam, diagnosis, procedures, and orders are all accurate and complete.   Signed, Freada Bergeron, MD  08/08/2021 3:36 PM    [place]

## 2021-08-09 ENCOUNTER — Other Ambulatory Visit (HOSPITAL_COMMUNITY): Payer: Self-pay

## 2021-08-09 DIAGNOSIS — F319 Bipolar disorder, unspecified: Secondary | ICD-10-CM | POA: Diagnosis not present

## 2021-08-09 DIAGNOSIS — F411 Generalized anxiety disorder: Secondary | ICD-10-CM | POA: Diagnosis not present

## 2021-08-09 DIAGNOSIS — F502 Bulimia nervosa: Secondary | ICD-10-CM | POA: Diagnosis not present

## 2021-08-09 DIAGNOSIS — G4700 Insomnia, unspecified: Secondary | ICD-10-CM | POA: Diagnosis not present

## 2021-08-09 LAB — BASIC METABOLIC PANEL
BUN/Creatinine Ratio: 19 (ref 9–23)
BUN: 17 mg/dL (ref 6–24)
CO2: 20 mmol/L (ref 20–29)
Calcium: 9 mg/dL (ref 8.7–10.2)
Chloride: 105 mmol/L (ref 96–106)
Creatinine, Ser: 0.89 mg/dL (ref 0.57–1.00)
Glucose: 80 mg/dL (ref 70–99)
Potassium: 3.8 mmol/L (ref 3.5–5.2)
Sodium: 138 mmol/L (ref 134–144)
eGFR: 83 mL/min/{1.73_m2} (ref >59)

## 2021-08-09 MED ORDER — VRAYLAR 1.5 MG PO CAPS
1.5000 mg | ORAL_CAPSULE | Freq: Every day | ORAL | 0 refills | Status: DC
  Filled 2021-08-09: qty 30, 30d supply, fill #0

## 2021-08-09 MED ORDER — TRAZODONE HCL 100 MG PO TABS
100.0000 mg | ORAL_TABLET | Freq: Every day | ORAL | 0 refills | Status: DC
  Filled 2021-08-09: qty 30, 30d supply, fill #0

## 2021-08-14 ENCOUNTER — Telehealth: Payer: Self-pay | Admitting: Student-PharmD

## 2021-08-14 ENCOUNTER — Telehealth: Payer: Self-pay | Admitting: *Deleted

## 2021-08-14 NOTE — Telephone Encounter (Signed)
Received referral from Dr. Shari Prows to start semaglutide for this patient for weight loss. They meet FDA approved criteria for semaglutide for use in obesity given BMI 33.76. Obesity is complicated by chronic conditions including HTN, dyspnea on exertion, hypothyroidism, hyperglycemia. No contraindications seen in the chart to [facility] use.  ? ?Submitted PA for [facility] 08/14/21. We will receive the determination via fax.  ?

## 2021-08-14 NOTE — Telephone Encounter (Signed)
-----   Message from Melony Overly sent at 08/14/2021 12:30 PM EST ----- ?Regarding: ct heart ?Scheduled 09/11/21 at 10:00 ? ? ?Thanks, ?[place] ? ?

## 2021-08-15 ENCOUNTER — Other Ambulatory Visit (HOSPITAL_COMMUNITY): Payer: Self-pay

## 2021-08-16 ENCOUNTER — Other Ambulatory Visit (HOSPITAL_COMMUNITY): Payer: Self-pay

## 2021-08-16 MED ORDER — SEMAGLUTIDE-WEIGHT MANAGEMENT 1.7 MG/0.75ML ~~LOC~~ SOAJ
1.7000 mg | SUBCUTANEOUS | 0 refills | Status: AC
  Filled 2021-08-16 – 2021-11-13 (×2): qty 3, 28d supply, fill #0

## 2021-08-16 MED ORDER — SEMAGLUTIDE-WEIGHT MANAGEMENT 2.4 MG/0.75ML ~~LOC~~ SOAJ
2.4000 mg | SUBCUTANEOUS | 5 refills | Status: DC
  Filled 2021-08-16: qty 3, fill #0

## 2021-08-16 MED ORDER — SEMAGLUTIDE-WEIGHT MANAGEMENT 0.5 MG/0.5ML ~~LOC~~ SOAJ
0.5000 mg | SUBCUTANEOUS | 0 refills | Status: AC
  Filled 2021-08-16 – 2021-09-20 (×3): qty 2, 28d supply, fill #0

## 2021-08-16 MED ORDER — SEMAGLUTIDE-WEIGHT MANAGEMENT 0.25 MG/0.5ML ~~LOC~~ SOAJ
0.2500 mg | SUBCUTANEOUS | 0 refills | Status: DC
  Filled 2021-08-16: qty 2, 28d supply, fill #0

## 2021-08-16 MED ORDER — SEMAGLUTIDE-WEIGHT MANAGEMENT 1 MG/0.5ML ~~LOC~~ SOAJ
1.0000 mg | SUBCUTANEOUS | 0 refills | Status: AC
  Filled 2021-08-16 – 2021-10-15 (×2): qty 2, 28d supply, fill #0

## 2021-08-16 NOTE — Telephone Encounter (Signed)
[employer] prior auth approved through 11/15/21 for a maximum of 3 fills, then will need another reauthorization.  ? ?Lipid panel in Care Everywhere from 07/16/21: TC 147, TG 99, HDL 41, LDL 88 ? ?Most recent A1c was 5.4% on 04/10/20 ? ?Dose titration sent to [facility] outpatient pharmacy per pt request. Provided her with link to sign up for $0 copay card. She'll stop by my office to review injection training. ?

## 2021-08-20 DIAGNOSIS — R002 Palpitations: Secondary | ICD-10-CM | POA: Diagnosis not present

## 2021-08-21 ENCOUNTER — Encounter: Payer: Self-pay | Admitting: Cardiology

## 2021-08-21 DIAGNOSIS — R600 Localized edema: Secondary | ICD-10-CM

## 2021-08-21 DIAGNOSIS — R0602 Shortness of breath: Secondary | ICD-10-CM

## 2021-08-21 DIAGNOSIS — R072 Precordial pain: Secondary | ICD-10-CM

## 2021-08-21 DIAGNOSIS — E669 Obesity, unspecified: Secondary | ICD-10-CM

## 2021-08-21 DIAGNOSIS — R0609 Other forms of dyspnea: Secondary | ICD-10-CM

## 2021-08-21 NOTE — Telephone Encounter (Addendum)
Echo requisition order printed off and faxed to Dr. Verl Dicker office at [phone].   ? ?Confirmation fax received. ?

## 2021-08-21 NOTE — Telephone Encounter (Signed)
From patient.

## 2021-08-21 NOTE — Telephone Encounter (Signed)
In addition to this mychart message, pt is requesting to have her echo that Dr. Shari Prows ordered, sent for her to have this done at Dr. Verl Dicker office, due to cost being $2000 less.  ? ?Per the pt, Dr. Verl Dicker office approved of this, and requested that we send the echo order to their office at telephone number [phone] and Fax [phone].  ? ?Called Dr. Verl Dicker office to inquire how the echo order should be placed for their office to be able to see, whether it be a handwritten order vs order placed in Epic.  Spoke with the receptionist there, and she said she will have someone from their office call me back to instruct our office how to place the echo order accordingly, so that pt can have done there and Dr. Shari Prows still receive the results. ? ?Will await Dr. Verl Dicker office to call back. ?

## 2021-08-21 NOTE — Telephone Encounter (Signed)
Cardiac CT for 4/4 cancelled and new order for pt to get a Cardiac Calcium Score (self pay) placed.  ?Pt is aware that [facility] scheduling will call her soon to arrange Calcium Score to be done here in the office.  Pt aware that Cardiac CT was cancelled.  ?Will send [facility] and CT Scheduler a message to call the pt and arrange Calcium Score to be done.  ?Pt verbalized understanding and agrees with this plan. ?

## 2021-08-22 ENCOUNTER — Other Ambulatory Visit (HOSPITAL_COMMUNITY): Payer: Self-pay

## 2021-08-22 ENCOUNTER — Other Ambulatory Visit: Payer: Self-pay | Admitting: Cardiology

## 2021-08-22 DIAGNOSIS — R600 Localized edema: Secondary | ICD-10-CM

## 2021-08-22 DIAGNOSIS — R0602 Shortness of breath: Secondary | ICD-10-CM | POA: Diagnosis not present

## 2021-08-22 DIAGNOSIS — R059 Cough, unspecified: Secondary | ICD-10-CM | POA: Diagnosis not present

## 2021-08-22 DIAGNOSIS — F17210 Nicotine dependence, cigarettes, uncomplicated: Secondary | ICD-10-CM | POA: Diagnosis not present

## 2021-08-22 MED ORDER — ALBUTEROL SULFATE HFA 108 (90 BASE) MCG/ACT IN AERS
2.0000 | INHALATION_SPRAY | Freq: Four times a day (QID) | RESPIRATORY_TRACT | 1 refills | Status: AC | PRN
  Filled 2021-08-22: qty 6.7, 30d supply, fill #0
  Filled 2021-09-10: qty 6.7, 25d supply, fill #0

## 2021-08-22 NOTE — Telephone Encounter (Signed)
From pt

## 2021-08-23 NOTE — Telephone Encounter (Signed)
Pt will have her echo done at Dr. Verl Dicker office on 3/29.  She will have her Calcium score appt on 4/5 at 1 pm.  Pt aware of both appt dates and times.  ?

## 2021-08-30 ENCOUNTER — Other Ambulatory Visit (HOSPITAL_COMMUNITY): Payer: Self-pay

## 2021-09-05 ENCOUNTER — Other Ambulatory Visit: Payer: Medicaid Other

## 2021-09-05 ENCOUNTER — Other Ambulatory Visit (HOSPITAL_COMMUNITY): Payer: 59

## 2021-09-05 DIAGNOSIS — G4700 Insomnia, unspecified: Secondary | ICD-10-CM | POA: Diagnosis not present

## 2021-09-05 DIAGNOSIS — F411 Generalized anxiety disorder: Secondary | ICD-10-CM | POA: Diagnosis not present

## 2021-09-05 DIAGNOSIS — F319 Bipolar disorder, unspecified: Secondary | ICD-10-CM | POA: Diagnosis not present

## 2021-09-05 DIAGNOSIS — F502 Bulimia nervosa: Secondary | ICD-10-CM | POA: Diagnosis not present

## 2021-09-10 ENCOUNTER — Other Ambulatory Visit (HOSPITAL_COMMUNITY): Payer: Self-pay

## 2021-09-11 ENCOUNTER — Ambulatory Visit (HOSPITAL_COMMUNITY): Payer: 59

## 2021-09-11 DIAGNOSIS — J45909 Unspecified asthma, uncomplicated: Secondary | ICD-10-CM | POA: Diagnosis not present

## 2021-09-11 DIAGNOSIS — J209 Acute bronchitis, unspecified: Secondary | ICD-10-CM | POA: Diagnosis not present

## 2021-09-12 ENCOUNTER — Other Ambulatory Visit: Payer: 59

## 2021-09-12 ENCOUNTER — Other Ambulatory Visit (HOSPITAL_COMMUNITY): Payer: Self-pay

## 2021-09-12 MED ORDER — EPINEPHRINE 0.3 MG/0.3ML IJ SOAJ
0.3000 mg | INTRAMUSCULAR | 0 refills | Status: DC
  Filled 2021-09-12: qty 2, 2d supply, fill #0

## 2021-09-18 ENCOUNTER — Other Ambulatory Visit (HOSPITAL_COMMUNITY): Payer: Self-pay

## 2021-09-19 ENCOUNTER — Other Ambulatory Visit (HOSPITAL_COMMUNITY): Payer: Self-pay

## 2021-09-19 MED ORDER — ALPRAZOLAM 0.5 MG PO TABS
0.5000 mg | ORAL_TABLET | Freq: Three times a day (TID) | ORAL | 0 refills | Status: DC
  Filled 2021-09-19: qty 90, 30d supply, fill #0

## 2021-09-20 ENCOUNTER — Other Ambulatory Visit (HOSPITAL_COMMUNITY): Payer: Self-pay

## 2021-09-20 DIAGNOSIS — Z8616 Personal history of COVID-19: Secondary | ICD-10-CM | POA: Diagnosis not present

## 2021-09-20 DIAGNOSIS — Z20818 Contact with and (suspected) exposure to other bacterial communicable diseases: Secondary | ICD-10-CM | POA: Diagnosis not present

## 2021-09-20 DIAGNOSIS — J029 Acute pharyngitis, unspecified: Secondary | ICD-10-CM | POA: Diagnosis not present

## 2021-09-28 ENCOUNTER — Ambulatory Visit: Payer: Medicaid Other

## 2021-09-28 DIAGNOSIS — R0602 Shortness of breath: Secondary | ICD-10-CM

## 2021-09-28 DIAGNOSIS — R600 Localized edema: Secondary | ICD-10-CM

## 2021-10-10 ENCOUNTER — Ambulatory Visit (INDEPENDENT_AMBULATORY_CARE_PROVIDER_SITE_OTHER)
Admission: RE | Admit: 2021-10-10 | Discharge: 2021-10-10 | Disposition: A | Discharge status: Home / Self Care | Payer: Self-pay | Source: Ambulatory Visit | Attending: Cardiology | Admitting: Cardiology

## 2021-10-10 ENCOUNTER — Other Ambulatory Visit: Payer: 59

## 2021-10-10 DIAGNOSIS — R0609 Other forms of dyspnea: Secondary | ICD-10-CM

## 2021-10-10 DIAGNOSIS — R0602 Shortness of breath: Secondary | ICD-10-CM

## 2021-10-10 DIAGNOSIS — R072 Precordial pain: Secondary | ICD-10-CM

## 2021-10-10 DIAGNOSIS — R600 Localized edema: Secondary | ICD-10-CM

## 2021-10-10 DIAGNOSIS — E669 Obesity, unspecified: Secondary | ICD-10-CM

## 2021-10-15 ENCOUNTER — Other Ambulatory Visit (HOSPITAL_COMMUNITY): Payer: Self-pay

## 2021-10-23 ENCOUNTER — Encounter: Payer: Self-pay | Admitting: Cardiology

## 2021-10-25 DIAGNOSIS — F411 Generalized anxiety disorder: Secondary | ICD-10-CM | POA: Diagnosis not present

## 2021-10-25 DIAGNOSIS — F909 Attention-deficit hyperactivity disorder, unspecified type: Secondary | ICD-10-CM | POA: Diagnosis not present

## 2021-10-25 DIAGNOSIS — F502 Bulimia nervosa: Secondary | ICD-10-CM | POA: Diagnosis not present

## 2021-10-25 DIAGNOSIS — F319 Bipolar disorder, unspecified: Secondary | ICD-10-CM | POA: Diagnosis not present

## 2021-10-31 ENCOUNTER — Other Ambulatory Visit (HOSPITAL_COMMUNITY): Payer: Self-pay

## 2021-10-31 DIAGNOSIS — H1033 Unspecified acute conjunctivitis, bilateral: Secondary | ICD-10-CM | POA: Diagnosis not present

## 2021-10-31 MED ORDER — TOBRAMYCIN 0.3 % OP SOLN
1.0000 [drp] | OPHTHALMIC | 0 refills | Status: DC
  Filled 2021-10-31: qty 5, 8d supply, fill #0

## 2021-11-05 DIAGNOSIS — J069 Acute upper respiratory infection, unspecified: Secondary | ICD-10-CM | POA: Diagnosis not present

## 2021-11-06 ENCOUNTER — Other Ambulatory Visit (HOSPITAL_COMMUNITY): Payer: Self-pay

## 2021-11-06 MED ORDER — MOXIFLOXACIN HCL 0.5 % OP SOLN
1.0000 [drp] | Freq: Three times a day (TID) | OPHTHALMIC | 0 refills | Status: DC
Start: 2021-11-03 — End: 2022-10-11
  Filled 2021-11-06: qty 3, 10d supply, fill #0

## 2021-11-09 ENCOUNTER — Other Ambulatory Visit (HOSPITAL_COMMUNITY): Payer: Self-pay

## 2021-11-13 ENCOUNTER — Other Ambulatory Visit (HOSPITAL_COMMUNITY): Payer: Self-pay

## 2021-11-20 DIAGNOSIS — F909 Attention-deficit hyperactivity disorder, unspecified type: Secondary | ICD-10-CM | POA: Diagnosis not present

## 2021-11-20 DIAGNOSIS — F319 Bipolar disorder, unspecified: Secondary | ICD-10-CM | POA: Diagnosis not present

## 2021-11-20 DIAGNOSIS — F502 Bulimia nervosa: Secondary | ICD-10-CM | POA: Diagnosis not present

## 2021-11-20 DIAGNOSIS — F411 Generalized anxiety disorder: Secondary | ICD-10-CM | POA: Diagnosis not present

## 2021-11-26 ENCOUNTER — Other Ambulatory Visit (HOSPITAL_COMMUNITY): Payer: Self-pay

## 2021-11-26 MED ORDER — VRAYLAR 1.5 MG PO CAPS
1.5000 mg | ORAL_CAPSULE | Freq: Every day | ORAL | 2 refills | Status: DC
  Filled 2021-11-26: qty 30, 30d supply, fill #0

## 2021-11-26 MED ORDER — AMPHETAMINE-DEXTROAMPHETAMINE 30 MG PO TABS
30.0000 mg | ORAL_TABLET | Freq: Two times a day (BID) | ORAL | 0 refills | Status: DC
Start: 2021-11-26 — End: 2022-10-11
  Filled 2021-11-26: qty 60, 30d supply, fill #0

## 2021-12-04 ENCOUNTER — Emergency Department (HOSPITAL_BASED_OUTPATIENT_CLINIC_OR_DEPARTMENT_OTHER): Payer: 59

## 2021-12-04 ENCOUNTER — Emergency Department (HOSPITAL_BASED_OUTPATIENT_CLINIC_OR_DEPARTMENT_OTHER)
Admission: EM | Admit: 2021-12-04 | Discharge: 2021-12-05 | Disposition: A | Discharge status: Home / Self Care | Payer: 59 | Attending: Emergency Medicine | Admitting: Emergency Medicine

## 2021-12-04 ENCOUNTER — Other Ambulatory Visit: Payer: Self-pay

## 2021-12-04 ENCOUNTER — Encounter (HOSPITAL_BASED_OUTPATIENT_CLINIC_OR_DEPARTMENT_OTHER): Payer: Self-pay | Admitting: *Deleted

## 2021-12-04 DIAGNOSIS — D72829 Elevated white blood cell count, unspecified: Secondary | ICD-10-CM | POA: Insufficient documentation

## 2021-12-04 DIAGNOSIS — R109 Unspecified abdominal pain: Secondary | ICD-10-CM | POA: Diagnosis present

## 2021-12-04 DIAGNOSIS — N3000 Acute cystitis without hematuria: Secondary | ICD-10-CM | POA: Diagnosis not present

## 2021-12-04 DIAGNOSIS — N132 Hydronephrosis with renal and ureteral calculous obstruction: Secondary | ICD-10-CM | POA: Diagnosis not present

## 2021-12-04 DIAGNOSIS — N200 Calculus of kidney: Secondary | ICD-10-CM | POA: Diagnosis not present

## 2021-12-04 LAB — CBC WITH DIFFERENTIAL/PLATELET
Abs Immature Granulocytes: 0.04 10*3/uL (ref 0.00–0.07)
Basophils Absolute: 0.1 10*3/uL (ref 0.0–0.1)
Basophils Relative: 1 %
Eosinophils Absolute: 0.5 10*3/uL (ref 0.0–0.5)
Eosinophils Relative: 4 %
HCT: 40 % (ref 36.0–46.0)
Hemoglobin: 13.6 g/dL (ref 12.0–15.0)
Immature Granulocytes: 0 %
Lymphocytes Relative: 19 %
Lymphs Abs: 2.1 10*3/uL (ref 0.7–4.0)
MCH: 28.9 pg (ref 26.0–34.0)
MCHC: 34 g/dL (ref 30.0–36.0)
MCV: 84.9 fL (ref 80.0–100.0)
Monocytes Absolute: 0.7 10*3/uL (ref 0.1–1.0)
Monocytes Relative: 6 %
Neutro Abs: 7.7 10*3/uL (ref 1.7–7.7)
Neutrophils Relative %: 70 %
Platelets: 313 10*3/uL (ref 150–400)
RBC: 4.71 MIL/uL (ref 3.87–5.11)
RDW: 12.8 % (ref 11.5–15.5)
WBC: 11.1 10*3/uL — ABNORMAL HIGH (ref 4.0–10.5)
nRBC: 0 % (ref 0.0–0.2)

## 2021-12-04 LAB — URINALYSIS, ROUTINE W REFLEX MICROSCOPIC
Bilirubin Urine: NEGATIVE
Glucose, UA: NEGATIVE mg/dL
Ketones, ur: NEGATIVE mg/dL
Leukocytes,Ua: NEGATIVE
Nitrite: NEGATIVE
RBC / HPF: >50 RBC/hpf — ABNORMAL HIGH (ref 0–5)
Specific Gravity, Urine: 1.022 (ref 1.005–1.030)
pH: 6.5 (ref 5.0–8.0)

## 2021-12-04 LAB — PREGNANCY, URINE: Preg Test, Ur: NEGATIVE

## 2021-12-04 MED ORDER — ONDANSETRON HCL 4 MG/2ML IJ SOLN
4.0000 mg | Freq: Once | INTRAMUSCULAR | Status: AC
  Administered 2021-12-04: 4 mg via INTRAVENOUS
  Filled 2021-12-04: qty 2

## 2021-12-04 MED ORDER — KETOROLAC TROMETHAMINE 30 MG/ML IJ SOLN
30.0000 mg | Freq: Once | INTRAMUSCULAR | Status: AC
  Administered 2021-12-04: 30 mg via INTRAVENOUS

## 2021-12-04 MED ORDER — OXYCODONE-ACETAMINOPHEN 5-325 MG PO TABS
1.0000 | ORAL_TABLET | ORAL | Status: DC | PRN
  Administered 2021-12-04: 1 via ORAL
  Filled 2021-12-04: qty 1

## 2021-12-04 MED ORDER — IBUPROFEN 400 MG PO TABS
400.0000 mg | ORAL_TABLET | Freq: Once | ORAL | Status: AC | PRN
  Administered 2021-12-04: 400 mg via ORAL
  Filled 2021-12-04: qty 1

## 2021-12-04 NOTE — ED Provider Notes (Signed)
[facility] EMERGENCY DEPT Provider Note   CSN: [id number] Arrival date & time: 12/04/21  2151     History  Chief Complaint  Patient presents with   Flank Pain    Cynthia Townsend is a 41 y.o. female.  The history is provided by the patient.  Flank Pain This is a new problem. The current episode started yesterday. The problem occurs constantly. The problem has not changed since onset.Associated symptoms include abdominal pain. Pertinent negatives include no chest pain, no headaches and no shortness of breath. Nothing aggravates the symptoms. Nothing relieves the symptoms. She has tried nothing for the symptoms. The treatment provided no relief.       Home Medications Prior to Admission medications   Medication Sig Start Date End Date Taking? Authorizing Provider  albuterol (PROVENTIL HFA;VENTOLIN HFA) 108 (90 Base) MCG/ACT inhaler Inhale 2 puffs into the lungs every 6 (six) hours as needed for wheezing or shortness of breath. Patient taking differently: Inhale 2 puffs into the lungs every 4 (four) hours as needed for wheezing or shortness of breath. 04/01/17   Bobbitt, Heywood Iles, MD  albuterol (VENTOLIN HFA) 108 (90 Base) MCG/ACT inhaler Inhale 2 puffs into the lungs every 6 (six) hours as needed for wheezing 08/22/21     ALPRAZolam (XANAX) 0.5 MG tablet TAKE 1/2 TABLET BY MOUTH 3 TIMES A DAY AS NEEDED FOR ANXIETY 12/21/18   Corwin Levins, MD  ALPRAZolam Prudy Feeler) 0.5 MG tablet Take 1 tablet (0.5 mg total) by mouth 3 (three) times daily only if needed 09/19/21     amLODipine (NORVASC) 5 MG tablet Take 1 tablet (5 mg total) by mouth daily. Please make overdue appt with Dr. Delton See before anymore refills. 2nd attempt 02/08/19   Lars Masson, MD  amphetamine-dextroamphetamine (ADDERALL) 30 MG tablet Take 1 tablet by mouth 2 (two) times daily. 01/26/19   Corwin Levins, MD  amphetamine-dextroamphetamine (ADDERALL) 30 MG tablet Take 1 tablet by mouth 2 (two) times daily.  11/26/21     baclofen (LIORESAL) 10 MG tablet Take 10 mg by mouth 2 (two) times daily as needed. 06/17/21   [provider]  butalbital-acetaminophen-caffeine (FIORICET) 50-325-40 MG tablet TAKE 1 TABLET BY MOUTH EVERY 4 (FOUR) HOURS AS NEEDED FOR HEADACHE. ((NEED OV)) Patient not taking: Reported on 08/08/2021 01/26/19   Corwin Levins, MD  cariprazine (VRAYLAR) 1.5 MG capsule Take 1 capsule (1.5 mg total) by mouth every evening 08/09/21     cariprazine (VRAYLAR) 1.5 MG capsule Take 1 capsule (1.5 mg total) by mouth every evening 11/26/21     Cholecalciferol 125 MCG (5000 UT) capsule     [provider]  diphenhydrAMINE (BENADRYL) 25 MG tablet Take 75 mg by mouth at bedtime as needed for sleep.  Patient not taking: Reported on 08/08/2021    [provider]  doxycycline (VIBRA-TABS) 100 MG tablet Take 1 tablet (100 mg total) by mouth 2 (two) times daily. 1 po bid Patient not taking: Reported on 08/08/2021 09/19/18   Bennie Pierini, FNP  EPINEPHrine 0.3 mg/0.3 mL IJ SOAJ injection Use device as directed for severe allergic reaction. 09/12/21     Ferrous Sulfate (IRON) 325 (65 Fe) MG TABS     [provider]  furosemide (LASIX) 20 MG tablet Take 1 tablet (20 mg total) by mouth 2 (two) times a week as needed for lower extremity swelling 08/08/21   Meriam Sprague, MD  Ibuprofen 200 MG CAPS     [provider]  levonorgestrel (MIRENA) 20 MCG/DAY IUD by Intrauterine route. 12/04/20   [provider]  levothyroxine (SYNTHROID) 75 MCG tablet Take 1 tablet by mouth daily. 06/18/21   [provider]  loratadine (CLARITIN) 10 MG tablet Take 10 mg by mouth daily as needed for allergies.    [provider]  metoprolol tartrate (LOPRESSOR) 100 MG tablet Take 1 tablet (100 mg total) by mouth once for 1 dose. Take 90-120 minutes prior to scan. 08/08/21 08/09/21  Meriam Sprague, MD  moxifloxacin (VIGAMOX) 0.5 % ophthalmic solution Place 1 drop into  both eyes 3 (three) times daily. 11/03/21     potassium chloride SA (KLOR-CON M) 20 MEQ tablet Take 1 tablet (20 mEq total) by mouth twice weekly as needed when you take furosemide 08/09/21   Meriam Sprague, MD  Semaglutide-Weight Management 1.7 MG/0.75ML SOAJ Inject 1.7 mg into the skin once a week for 28 days. 11/11/21 12/11/21  Meriam Sprague, MD  Semaglutide-Weight Management 2.4 MG/0.75ML SOAJ Inject 2.4 mg into the skin once a week. 12/10/21   Meriam Sprague, MD  sertraline (ZOLOFT) 100 MG tablet TAKE 1.5 TABLETS (150 MG TOTAL) BY MOUTH DAILY. 07/27/18   Corwin Levins, MD  tobramycin (TOBREX) 0.3 % ophthalmic solution Place 1 drop into both eyes every 4 (four) hours. 10/31/21     topiramate (TOPAMAX) 100 MG tablet Take 1 tablet by mouth 2 (two) times daily. 03/26/21   [provider]  traZODone (DESYREL) 100 MG tablet Take 1 tablet (100 mg total) by mouth at bedtime. 08/09/21     traZODone (DESYREL) 150 MG tablet SMARTSIG:1-1.5 Tablet(s) By Mouth Every Evening 07/28/21   [provider]  VRAYLAR 1.5 MG capsule SMARTSIG:1 Capsule(s) By Mouth Every Evening 08/01/21   [provider]      Allergies    Penicillins and Sulfa antibiotics    Review of Systems   Review of Systems  Constitutional:  Negative for fever.  HENT:  Negative for facial swelling.   Respiratory:  Negative for shortness of breath.   Cardiovascular:  Negative for chest pain.  Gastrointestinal:  Positive for abdominal pain.  Genitourinary:  Positive for flank pain.  Neurological:  Negative for headaches.  Psychiatric/Behavioral:  Negative for agitation.   All other systems reviewed and are negative.   Physical Exam Updated Vital Signs BP 135/66   Pulse 72   Temp 98.1 F (36.7 C)   Resp 18   SpO2 100%  Physical Exam Vitals and nursing note reviewed.  Constitutional:      General: She is not in acute distress.    Appearance: Normal appearance. She is well-developed.  HENT:      Head: Normocephalic and atraumatic.     Nose: Nose normal.  Eyes:     Pupils: Pupils are equal, round, and reactive to light.  Cardiovascular:     Rate and Rhythm: Normal rate and regular rhythm.  Pulmonary:     Effort: Pulmonary effort is normal. No respiratory distress.     Breath sounds: Normal breath sounds.  Abdominal:     General: Abdomen is flat. Bowel sounds are normal. There is no distension.     Palpations: Abdomen is soft.     Tenderness: There is no abdominal tenderness. There is no guarding or rebound.  Genitourinary:    Vagina: No vaginal discharge.  Musculoskeletal:        General: Normal range of motion.     Cervical back: Neck supple.  Skin:  Capillary Refill: Capillary refill takes less than 2 seconds.     Findings: No erythema or rash.  Neurological:     General: No focal deficit present.     Mental Status: She is alert and oriented to person, place, and time.     Deep Tendon Reflexes: Reflexes normal.  Psychiatric:        Mood and Affect: Mood normal.        Behavior: Behavior normal.     ED Results / Procedures / Treatments   Labs (all labs ordered are listed, but only abnormal results are displayed) Results for orders placed or performed during the hospital encounter of 12/04/21  Urinalysis, Routine w reflex microscopic  Result Value Ref Range   Color, Urine YELLOW YELLOW   APPearance HAZY (A) CLEAR   Specific Gravity, Urine 1.022 1.005 - 1.030   pH 6.5 5.0 - 8.0   Glucose, UA NEGATIVE NEGATIVE mg/dL   Hgb urine dipstick LARGE (A) NEGATIVE   Bilirubin Urine NEGATIVE NEGATIVE   Ketones, ur NEGATIVE NEGATIVE mg/dL   Protein, ur TRACE (A) NEGATIVE mg/dL   Nitrite NEGATIVE NEGATIVE   Leukocytes,Ua NEGATIVE NEGATIVE   RBC / HPF >50 (H) 0 - 5 RBC/hpf   WBC, UA 0-5 0 - 5 WBC/hpf   Bacteria, UA RARE (A) NONE SEEN   Squamous Epithelial / LPF 21-50 0 - 5   Mucus PRESENT   Pregnancy, urine  Result Value Ref Range   Preg Test, Ur NEGATIVE NEGATIVE   CBC with Differential  Result Value Ref Range   WBC 11.1 (H) 4.0 - 10.5 K/uL   RBC 4.71 3.87 - 5.11 MIL/uL   Hemoglobin 13.6 12.0 - 15.0 g/dL   HCT 40.9 81.1 - 91.4 %   MCV 84.9 80.0 - 100.0 fL   MCH 28.9 26.0 - 34.0 pg   MCHC 34.0 30.0 - 36.0 g/dL   RDW 78.2 95.6 - 21.3 %   Platelets 313 150 - 400 K/uL   nRBC 0.0 0.0 - 0.2 %   Neutrophils Relative % 70 %   Neutro Abs 7.7 1.7 - 7.7 K/uL   Lymphocytes Relative 19 %   Lymphs Abs 2.1 0.7 - 4.0 K/uL   Monocytes Relative 6 %   Monocytes Absolute 0.7 0.1 - 1.0 K/uL   Eosinophils Relative 4 %   Eosinophils Absolute 0.5 0.0 - 0.5 K/uL   Basophils Relative 1 %   Basophils Absolute 0.1 0.0 - 0.1 K/uL   Immature Granulocytes 0 %   Abs Immature Granulocytes 0.04 0.00 - 0.07 K/uL  Basic metabolic panel  Result Value Ref Range   Sodium 138 135 - 145 mmol/L   Potassium 3.8 3.5 - 5.1 mmol/L   Chloride 106 98 - 111 mmol/L   CO2 23 22 - 32 mmol/L   Glucose, Bld 103 (H) 70 - 99 mg/dL   BUN 15 6 - 20 mg/dL   Creatinine, Ser 0.86 0.44 - 1.00 mg/dL   Calcium 9.8 8.9 - 57.8 mg/dL   GFR, Estimated >46 >96 mL/min   Anion gap 9 5 - 15   CT Renal Stone Study  Result Date: 12/04/2021 CLINICAL DATA:  Sharp right flank pain EXAM: CT ABDOMEN AND PELVIS WITHOUT CONTRAST TECHNIQUE: Multidetector CT imaging of the abdomen and pelvis was performed following the standard protocol without IV contrast. RADIATION DOSE REDUCTION: This exam was performed according to the departmental dose-optimization program which includes automated exposure control, adjustment of the mA and/or kV according to patient  size and/or use of iterative reconstruction technique. COMPARISON:  None Available. FINDINGS: Lower chest: No acute abnormality. Hepatobiliary: No focal liver abnormality is seen. No gallstones, gallbladder wall thickening, or biliary dilatation. Pancreas: Unremarkable. No pancreatic ductal dilatation or surrounding inflammatory changes. Spleen: Normal in size without  focal abnormality. Adrenals/Urinary Tract: Adrenal glands are normal. There is mild right hydronephrosis and hydroureter, secondary to a 3 mm stone at the right UVJ, series 2 image 71. There are punctate bilateral intrarenal stones. The bladder is unremarkable. Stomach/Bowel: Stomach is within normal limits. Appendix appears normal. No evidence of bowel wall thickening, distention, or inflammatory changes. Vascular/Lymphatic: No significant vascular findings are present. No enlarged abdominal or pelvic lymph nodes. Reproductive: IUD in the uterus.  No adnexal mass Other: Negative for pelvic effusion or free air Musculoskeletal: No acute or significant osseous findings. IMPRESSION: 1. Mild right hydronephrosis and hydroureter, secondary to a 3-4 mm stone at the right UVJ 2. Punctate bilateral intrarenal stone Electronically Signed   By: Jasmine Pang M.D.   On: 12/04/2021 22:32     Procedures Procedures    Medications Ordered in ED Medications  oxyCODONE-acetaminophen (PERCOCET/ROXICET) 5-325 MG per tablet 1 tablet (1 tablet Oral Given 12/04/21 2210)  ibuprofen (ADVIL) tablet 400 mg (400 mg Oral Given 12/04/21 2210)  ketorolac (TORADOL) 30 MG/ML injection 30 mg (30 mg Intravenous Given 12/04/21 2348)  ondansetron (ZOFRAN) injection 4 mg (4 mg Intravenous Given 12/04/21 2348)    ED Course/ Medical Decision Making/ A&P                           Medical Decision Making R flank pain radiated to pelvic area   Amount and/or Complexity of Data Reviewed Independent Historian: spouse    Details: see above External Data Reviewed: notes.    Details: previous notes reviewd Labs: ordered.    Details: all labs reviewed: large hemoglobin in urine.  No UTI.  Elevated white count 11.1 normal hemoglobin and platelets.  normal Sodium and potassium and creatinine. Radiology: ordered.  Risk Prescription drug management. Risk Details: Strain all urine.  Take medication as directed.  Follow up with urology as  directed.  No driving or drinking alcohol if taking narcotic pain medication.      Final Clinical Impression(s) / ED Diagnoses Final diagnoses:  None   Return for intractable cough, coughing up blood, fevers > 100.4 unrelieved by medication, shortness of breath, intractable vomiting, chest pain, shortness of breath, weakness, numbness, changes in speech, facial asymmetry, abdominal pain, passing out, Inability to tolerate liquids or food, cough, altered mental status or any concerns. No signs of systemic illness or infection. The patient is nontoxic-appearing on exam and vital signs are within normal limits.  I have reviewed the triage vital signs and the nursing notes. Pertinent labs & imaging results that were available during my care of the patient were reviewed by me and considered in my medical decision making (see chart for details). After history, exam, and medical workup I feel the patient has been appropriately medically screened and is safe for discharge home. Pertinent diagnoses were discussed with the patient. Patient was given return precautions.  Rx / DC Orders ED Discharge Orders     None         Keyira Mondesir, MD 12/05/21 8416

## 2021-12-05 DIAGNOSIS — N201 Calculus of ureter: Secondary | ICD-10-CM | POA: Diagnosis not present

## 2021-12-05 LAB — BASIC METABOLIC PANEL
Anion gap: 9 (ref 5–15)
BUN: 15 mg/dL (ref 6–20)
CO2: 23 mmol/L (ref 22–32)
Calcium: 9.8 mg/dL (ref 8.9–10.3)
Chloride: 106 mmol/L (ref 98–111)
Creatinine, Ser: 0.72 mg/dL (ref 0.44–1.00)
GFR, Estimated: >60 mL/min (ref >60)
Glucose, Bld: 103 mg/dL — ABNORMAL HIGH (ref 70–99)
Potassium: 3.8 mmol/L (ref 3.5–5.1)
Sodium: 138 mmol/L (ref 135–145)

## 2021-12-05 MED ORDER — TRAMADOL HCL 50 MG PO TABS: 50.0000 mg | ORAL_TABLET | Freq: Four times a day (QID) | ORAL | 0 refills | Status: DC | PRN

## 2021-12-05 MED ORDER — DICLOFENAC SODIUM ER 100 MG PO TB24: 100.0000 mg | ORAL_TABLET | Freq: Every day | ORAL | 0 refills | Status: DC

## 2021-12-05 MED ORDER — ONDANSETRON 8 MG PO TBDP: ORAL_TABLET | ORAL | 0 refills | Status: DC

## 2021-12-05 NOTE — ED Notes (Signed)
Pt verbalizes understanding of discharge instructions. Opportunity for questioning and answers were provided. Pt discharged from ED to home with husband.    

## 2021-12-09 DIAGNOSIS — S30861A Insect bite (nonvenomous) of abdominal wall, initial encounter: Secondary | ICD-10-CM | POA: Diagnosis not present

## 2021-12-09 DIAGNOSIS — W57XXXA Bitten or stung by nonvenomous insect and other nonvenomous arthropods, initial encounter: Secondary | ICD-10-CM | POA: Diagnosis not present

## 2021-12-17 DIAGNOSIS — M25561 Pain in right knee: Secondary | ICD-10-CM | POA: Diagnosis not present

## 2021-12-17 DIAGNOSIS — K649 Unspecified hemorrhoids: Secondary | ICD-10-CM | POA: Diagnosis not present

## 2022-04-11 ENCOUNTER — Other Ambulatory Visit (HOSPITAL_COMMUNITY): Payer: Self-pay

## 2022-04-11 MED ORDER — BACLOFEN 10 MG PO TABS
10.0000 mg | ORAL_TABLET | Freq: Two times a day (BID) | ORAL | 1 refills | Status: DC | PRN
  Filled 2022-04-11: qty 60, 30d supply, fill #0

## 2022-04-12 ENCOUNTER — Other Ambulatory Visit (HOSPITAL_COMMUNITY): Payer: Self-pay

## 2022-04-22 ENCOUNTER — Other Ambulatory Visit (HOSPITAL_COMMUNITY): Payer: Self-pay

## 2022-05-05 NOTE — Progress Notes (Deleted)
Cardiology Office Note:    Date:  05/05/2022   ID:  Cynthia Townsend, DOB 14-Jan-1981, MRN [id number]  PCP:  Margarito Courser, MD   [facility] Providers Cardiologist:  None { Click to update primary MD,subspecialty MD or APP then REFRESH:1}    Referring MD: Margarito Courser, MD   Chief Complaint: ***  History of Present Illness:    Cynthia Townsend is a *** 41 y.o. female with a hx of gestational HTN, postpartum preeclampsia with pulmonary edema requiring diuresis, anxiety, and asthma  This cardiology clinic visit was 08/08/2021 with Dr. Johney Frame for evaluation of palpitations.  She reported frequent palpitations.  Symptoms can occur almost every other day and are worse when laying down at night.  She tried the EKG function on her friend smart watch and was told she had A-fib 2 of the 3 time she tried.  Has associated SOB, but no lightheadedness or syncope.  She has been on Adderall for years.  She also reports shortness of breath with exertion believed to be 2/2 weight gain.  Has progressively worsened and she now feels more winded with simple exertion or with talking.  Occasional chest pain and left to central chest she describes as squeezing or pinching.  Chest pain occurs randomly, regardless of exertion.  Recently noticed pitting edema in her legs.  Her boyfriend convinced her to present to the ED.  In ED, troponins were negative, BNP normal and D-dimer negative.   Endorses a family history of cardiovascular disease in both her mother and father who had hypertension, hyperlipidemia, CAD, stents, and MI at an early age.  Her sister, maternal uncle and maternal grandfather also had heart disease.  Has seen a bariatric specialist, nutritionist, and tried the keto diet.  History of bulimia lasting 30 years starting when she was 41 years old.  Has since recovered after having children, however after pregnancy she has had trouble losing weight and wonders if she is a good candidate for GLP-1. She met  criteria and was started on Wegovy.  Cardiac monitor 08/2021 revealed dominant rhythm NSR with average HR 88 bpm, rare SVE, patient triggered events correlate with PVCs, no significant arrhythmias or pauses.  Was advised she could try metoprolol 25 mg twice daily to help with symptoms.  Echo 09/30/2021 revealed normal LV EF 65 to 13%, normal diastolic pattern, no significant valve disease.  CT cardiac score revealed calcium score of 0 Agatston units.  Today, she is here for 26-monthfollow-up.    Past Medical History:  Diagnosis Date   ADD (attention deficit disorder)    Anemia    post partum   Anemia requiring transfusions    of iron   Anxiety    Asthma    Blood transfusion without reported diagnosis    Carpal tunnel syndrome during pregnancy    Chicken pox    Classic migraine with aura 09/20/2015   DHarriet Phodisease (radial styloid tenosynovitis)    Depression    Ectopic pregnancy    Insomnia    Laceration of finger    left index with nerve involvement   Nasal polyps    denies   Pneumonia    Postpartum hemorrhage    Pre-eclampsia in postpartum period    Pregnancy induced hypertension    Pulmonary edema    Recurrent upper respiratory infection (URI)    Sciatic pain    Subclinical hypothyroidism    sub-clinical   Urticaria    dogs   Vitamin D deficiency  Past Surgical History:  Procedure Laterality Date   BREAST BIOPSY Right    benign  punch bx   DILATION AND CURETTAGE OF UTERUS     DILATION AND EVACUATION N/A 09/04/2015   Procedure: DILATATION AND EVACUATION;  Surgeon: Linda Hedges, DO;  Location: [facility];  Service: Gynecology;  Laterality: N/A;   DNC     I & D EXTREMITY Left 04/10/2017   Procedure: LEFT INDEX FINGER WOUND REPAIR AND NERVE REPAIR;  Surgeon: Iran Planas, MD;  Location: [facility];  Service: Orthopedics;  Laterality: Left;    Current Medications: No outpatient medications have been marked as taking for the 05/07/22 encounter (Appointment) with  Ann Maki, Lanice Schwab, NP.     Allergies:   Penicillins and Sulfa antibiotics   Social History   Socioeconomic History   Marital status: Single    Spouse name: Not on file   Number of children: 0   Years of education: 16   Highest education level: Not on file  Occupational History   Occupation: CMA    Comment: Heart Care  Tobacco Use   Smoking status: Former    Types: Cigarettes   Smokeless tobacco: Never  Vaping Use   Vaping Use: Every day  Substance and Sexual Activity   Alcohol use: No   Drug use: No   Sexual activity: Yes    Birth control/protection: I.U.D.  Other Topics Concern   Not on file  Social History Narrative   Lives at home w/ her fiance and new daughter.   Right-handed.   Drinks about 1-2 cups of caffeine per day.   Fun: Hiking, Zumba, sleep   Denies religious beliefs effecting health care.   Denies abuse and feels safe at home.    Social Determinants of Health   Financial Resource Strain: Not on file  Food Insecurity: Not on file  Transportation Needs: Not on file  Physical Activity: Not on file  Stress: Not on file  Social Connections: Not on file     Family History: The patient's ***family history includes AAA (abdominal aortic aneurysm) in her father; Alcohol abuse in her mother; COPD in her mother; Cancer in her sister; Colon cancer in her paternal uncle; Diabetes in her sister; Heart attack in her father and sister; Heart attack (age of onset: 79) in her maternal grandfather; Heart attack (age of onset: 12) in her sister; Heart disease in her father, mother, and sister; Heart disease (age of onset: 50) in her maternal uncle; Hyperlipidemia in her father and mother; Hyperthyroidism in her mother; Lung cancer in her mother; Mental illness in her mother and sister; Pulmonary embolism in her father; Rheum arthritis in her maternal grandmother; Transient ischemic attack in her father.  ROS:   Please see the history of present illness.    *** All other  systems reviewed and are negative.  Labs/Other Studies Reviewed:    The following studies were reviewed today:  Calcium Score 10/10/21  IMPRESSION: Coronary calcium score of 0 Agatston units. This suggests low risk for future cardiac events.  Echo 09/30/21  Echocardiogram 09/28/2021:  Normal LV systolic function with visual EF 65-70%. Left ventricle cavity  is normal in size. Normal left ventricular wall thickness. Normal global  wall motion. Normal diastolic filling pattern, normal LAP.  No significant valvular heart disease.  Compared to 07/2017 no significant change.  Cardiac Monitor 08/23/21  Patch wear time was 5 days and 7 hours Predominant rhythm was NSR with average HR 88bpm (ranging 53-133bpm) Rare SVE,  rare VE (<1%) Patient triggered events correlate with PVCs. No significant arrhythmias or pauses.  Recent Labs: 08/03/2021: B Natriuretic Peptide 23.1 12/04/2021: BUN 15; Creatinine, Ser 0.72; Hemoglobin 13.6; Platelets 313; Potassium 3.8; Sodium 138  Recent Lipid Panel No results found for: "CHOL", "TRIG", "HDL", "CHOLHDL", "VLDL", "LDLCALC", "LDLDIRECT"   Risk Assessment/Calculations:   {Does this patient have ATRIAL FIBRILLATION?:[phone]}       Physical Exam:    VS:  There were no vitals taken for this visit.    Wt Readings from Last 3 Encounters:  08/08/21 190 lb 9.6 oz (86.5 kg)  08/03/21 190 lb (86.2 kg)  06/26/17 230 lb (104.3 kg)     GEN: *** Well nourished, well developed in no acute distress HEENT: Normal NECK: No JVD; No carotid bruits CARDIAC: ***RRR, no murmurs, rubs, gallops RESPIRATORY:  Clear to auscultation without rales, wheezing or rhonchi  ABDOMEN: Soft, non-tender, non-distended MUSCULOSKELETAL:  No edema; No deformity. *** pedal pulses, ***bilaterally SKIN: Warm and dry NEUROLOGIC:  Alert and oriented x 3 PSYCHIATRIC:  Normal affect   EKG:  EKG is *** ordered today.  The ekg ordered today demonstrates ***  No BP recorded.   {Refresh Note OR Click here to enter BP  :1}***    Diagnoses:    No diagnosis found. Assessment and Plan:     Palpitations:  Hypertension: Obesity    {Are you ordering a CV Procedure (e.g. stress test, cath, DCCV, TEE, etc)?   Press F2        :[id number]}   Disposition:  Medication Adjustments/Labs and Tests Ordered: Current medicines are reviewed at length with the patient today.  Concerns regarding medicines are outlined above.  No orders of the defined types were placed in this encounter.  No orders of the defined types were placed in this encounter.   There are no Patient Instructions on file for this visit.   Signed, Emmaline Life, NP  05/05/2022 2:20 PM    [facility]

## 2022-05-07 ENCOUNTER — Ambulatory Visit: Payer: 59 | Admitting: Nurse Practitioner

## 2022-05-29 ENCOUNTER — Other Ambulatory Visit (HOSPITAL_COMMUNITY): Payer: Self-pay

## 2022-05-29 MED ORDER — DOXYCYCLINE HYCLATE 100 MG PO TABS
100.0000 mg | ORAL_TABLET | Freq: Two times a day (BID) | ORAL | 0 refills | Status: DC
  Filled 2022-05-29: qty 20, 10d supply, fill #0

## 2022-05-30 ENCOUNTER — Other Ambulatory Visit (HOSPITAL_COMMUNITY): Payer: Self-pay

## 2022-09-07 ENCOUNTER — Emergency Department (HOSPITAL_BASED_OUTPATIENT_CLINIC_OR_DEPARTMENT_OTHER): Payer: Medicaid Other | Admitting: Radiology

## 2022-09-07 ENCOUNTER — Emergency Department (HOSPITAL_BASED_OUTPATIENT_CLINIC_OR_DEPARTMENT_OTHER)
Admission: EM | Admit: 2022-09-07 | Discharge: 2022-09-07 | Disposition: A | Discharge status: Home / Self Care | Payer: Medicaid Other | Attending: Emergency Medicine | Admitting: Emergency Medicine

## 2022-09-07 ENCOUNTER — Encounter (HOSPITAL_BASED_OUTPATIENT_CLINIC_OR_DEPARTMENT_OTHER): Payer: Self-pay | Admitting: Emergency Medicine

## 2022-09-07 DIAGNOSIS — R002 Palpitations: Secondary | ICD-10-CM

## 2022-09-07 DIAGNOSIS — R600 Localized edema: Secondary | ICD-10-CM | POA: Insufficient documentation

## 2022-09-07 DIAGNOSIS — R0602 Shortness of breath: Secondary | ICD-10-CM | POA: Diagnosis present

## 2022-09-07 DIAGNOSIS — J45909 Unspecified asthma, uncomplicated: Secondary | ICD-10-CM | POA: Diagnosis not present

## 2022-09-07 DIAGNOSIS — R072 Precordial pain: Secondary | ICD-10-CM

## 2022-09-07 LAB — CBC
HCT: 37 % (ref 36.0–46.0)
Hemoglobin: 12.8 g/dL (ref 12.0–15.0)
MCH: 28.2 pg (ref 26.0–34.0)
MCHC: 34.6 g/dL (ref 30.0–36.0)
MCV: 81.5 fL (ref 80.0–100.0)
Platelets: 346 10*3/uL (ref 150–400)
RBC: 4.54 MIL/uL (ref 3.87–5.11)
RDW: 13 % (ref 11.5–15.5)
WBC: 9.9 10*3/uL (ref 4.0–10.5)
nRBC: 0 % (ref 0.0–0.2)

## 2022-09-07 LAB — D-DIMER, QUANTITATIVE: D-Dimer, Quant: 0.33 ug/mL-FEU (ref 0.00–0.50)

## 2022-09-07 LAB — BASIC METABOLIC PANEL
Anion gap: 11 (ref 5–15)
BUN: 9 mg/dL (ref 6–20)
CO2: 25 mmol/L (ref 22–32)
Calcium: 9.2 mg/dL (ref 8.9–10.3)
Chloride: 101 mmol/L (ref 98–111)
Creatinine, Ser: 0.6 mg/dL (ref 0.44–1.00)
GFR, Estimated: >60 mL/min (ref >60)
Glucose, Bld: 118 mg/dL — ABNORMAL HIGH (ref 70–99)
Potassium: 3.7 mmol/L (ref 3.5–5.1)
Sodium: 137 mmol/L (ref 135–145)

## 2022-09-07 LAB — TROPONIN I (HIGH SENSITIVITY): Troponin I (High Sensitivity): 4 ng/L (ref <18)

## 2022-09-07 LAB — BRAIN NATRIURETIC PEPTIDE: B Natriuretic Peptide: 11.9 pg/mL (ref 0.0–100.0)

## 2022-09-07 MED ORDER — FUROSEMIDE 20 MG PO TABS: 20.0000 mg | ORAL_TABLET | ORAL | 1 refills | Status: DC

## 2022-09-07 MED ORDER — IPRATROPIUM-ALBUTEROL 0.5-2.5 (3) MG/3ML IN SOLN
3.0000 mL | Freq: Once | RESPIRATORY_TRACT | Status: AC
  Administered 2022-09-07: 3 mL via RESPIRATORY_TRACT
  Filled 2022-09-07: qty 3

## 2022-09-07 MED ORDER — FUROSEMIDE 10 MG/ML IJ SOLN
40.0000 mg | Freq: Once | INTRAMUSCULAR | Status: AC
  Administered 2022-09-07: 40 mg via INTRAVENOUS
  Filled 2022-09-07: qty 4

## 2022-09-07 NOTE — Discharge Instructions (Signed)
You came to the emergent department today with shortness of breath.  As we discussed, your workup is reassuring.  We discussed the possibility of CAT scan of your chest and you agree that we likely do not need to do this at this time.  With any returning or worsening symptoms please go to the nearest emergency department.  Your Lasix has been refilled.  This will be at your pharmacy.  Please make an appointment with your PCP as soon as possible to discuss your edema and asthma.

## 2022-09-07 NOTE — ED Provider Notes (Signed)
[place] [facility] Provider Note   CSN: [id number] Arrival date & time: 09/07/22  1659     History  Chief Complaint  Patient presents with   Shortness of Breath    Cynthia Townsend is a 42 y.o. female with a past medical history of asthma, pulmonary edema and OSA presenting today with shortness of breath.  She reports that over the past 3 days she has felt increasingly short of breath.  Reports it is with ambulation and laying flat on her back.  Also has noticed some swelling in her bilateral lower extremities, mostly her left leg.  She says that the shortness of breath feels like when she had pulmonary edema in the past.  No history of heart failure and had a recent negative echocardiogram.  She also just started Symbicort for her asthma a month and a half ago and she is not sure whether or not it is working.  Started sleeping with a CPAP a couple weeks ago.  No chest pain.  No personal history of DVT/PE however has a sibling as well as a father who have had PEs.  No known familial coagulopathy.  No recent travel, did have a thyroidectomy a month ago.  No OCPs or tobacco use.   Shortness of Breath Associated symptoms: no chest pain, no cough and no fever        Home Medications Prior to Admission medications   Medication Sig Start Date End Date Taking? Authorizing Provider  albuterol (PROVENTIL HFA;VENTOLIN HFA) 108 (90 Base) MCG/ACT inhaler Inhale 2 puffs into the lungs every 6 (six) hours as needed for wheezing or shortness of breath. Patient taking differently: Inhale 2 puffs into the lungs every 4 (four) hours as needed for wheezing or shortness of breath. 04/01/17   Bobbitt, [place] Muta, MD  albuterol (VENTOLIN HFA) 108 (90 Base) MCG/ACT inhaler Inhale 2 puffs into the lungs every 6 (six) hours as needed for wheezing 08/22/21     ALPRAZolam (XANAX) 0.5 MG tablet TAKE 1/2 TABLET BY MOUTH 3 TIMES A DAY AS NEEDED FOR ANXIETY 12/21/18   Biagio Borg, MD  ALPRAZolam Duanne Moron) 0.5 MG tablet Take 1 tablet (0.5 mg total) by mouth 3 (three) times daily only if needed 09/19/21     amLODipine (NORVASC) 5 MG tablet Take 1 tablet (5 mg total) by mouth daily. Please make overdue appt with Dr. Meda Coffee before anymore refills. 2nd attempt 02/08/19   Dorothy Spark, MD  amphetamine-dextroamphetamine (ADDERALL) 30 MG tablet Take 1 tablet by mouth 2 (two) times daily. 01/26/19   Biagio Borg, MD  amphetamine-dextroamphetamine (ADDERALL) 30 MG tablet Take 1 tablet by mouth 2 (two) times daily. 11/26/21     baclofen (LIORESAL) 10 MG tablet Take 10 mg by mouth 2 (two) times daily as needed. 06/17/21   [provider]  baclofen (LIORESAL) 10 MG tablet Take 1 tablet (10 mg total) by mouth 2 (two) times daily as needed. 04/11/22     butalbital-acetaminophen-caffeine (FIORICET) 50-325-40 MG tablet TAKE 1 TABLET BY MOUTH EVERY 4 (FOUR) HOURS AS NEEDED FOR HEADACHE. ((NEED OV)) Patient not taking: Reported on 08/08/2021 01/26/19   Biagio Borg, MD  cariprazine (VRAYLAR) 1.5 MG capsule Take 1 capsule (1.5 mg total) by mouth every evening 08/09/21     cariprazine (VRAYLAR) 1.5 MG capsule Take 1 capsule (1.5 mg total) by mouth every evening 11/26/21     Cholecalciferol 125 MCG (5000 UT) capsule     [provider]  Diclofenac Sodium CR 100 MG 24 hr tablet Take 1 tablet (100 mg total) by mouth daily. 12/05/21   Palumbo, April, MD  diphenhydrAMINE (BENADRYL) 25 MG tablet Take 75 mg by mouth at bedtime as needed for sleep.  Patient not taking: Reported on 08/08/2021    [provider]  doxycycline (VIBRA-TABS) 100 MG tablet Take 1 tablet (100 mg total) by mouth 2 (two) times daily. 1 po bid Patient not taking: Reported on 08/08/2021 09/19/18   Chevis Pretty, FNP  doxycycline (VIBRA-TABS) 100 MG tablet Take 1 tablet (100 mg total) by mouth 2 (two) times daily for 10 days 05/29/22     EPINEPHrine 0.3 mg/0.3 mL IJ SOAJ injection Use device as directed for  severe allergic reaction. 09/12/21     Ferrous Sulfate (IRON) 325 (65 Fe) MG TABS     [provider]  furosemide (LASIX) 20 MG tablet Take 1 tablet (20 mg total) by mouth 2 (two) times a week as needed for lower extremity swelling 09/09/22   Raksha Wolfgang A, PA-C  Ibuprofen 200 MG CAPS     [provider]  levonorgestrel (MIRENA) 20 MCG/DAY IUD by Intrauterine route. 12/04/20   [provider]  levothyroxine (SYNTHROID) 75 MCG tablet Take 1 tablet by mouth daily. 06/18/21   [provider]  loratadine (CLARITIN) 10 MG tablet Take 10 mg by mouth daily as needed for allergies.    [provider]  metoprolol tartrate (LOPRESSOR) 100 MG tablet Take 1 tablet (100 mg total) by mouth once for 1 dose. Take 90-120 minutes prior to scan. 08/08/21 08/09/21  Freada Bergeron, MD  moxifloxacin (VIGAMOX) 0.5 % ophthalmic solution Place 1 drop into both eyes 3 (three) times daily. 11/03/21     ondansetron (ZOFRAN-ODT) 8 MG disintegrating tablet 8mg  ODT q8 hours prn nausea 12/05/21   Palumbo, April, MD  potassium chloride SA (KLOR-CON M) 20 MEQ tablet Take 1 tablet (20 mEq total) by mouth twice weekly as needed when you take furosemide 08/09/21   Freada Bergeron, MD  Semaglutide-Weight Management 2.4 MG/0.75ML SOAJ Inject 2.4 mg into the skin once a week. 12/10/21   Freada Bergeron, MD  sertraline (ZOLOFT) 100 MG tablet TAKE 1.5 TABLETS (150 MG TOTAL) BY MOUTH DAILY. 07/27/18   Biagio Borg, MD  tobramycin (TOBREX) 0.3 % ophthalmic solution Place 1 drop into both eyes every 4 (four) hours. 10/31/21     topiramate (TOPAMAX) 100 MG tablet Take 1 tablet by mouth 2 (two) times daily. 03/26/21   [provider]  traMADol (ULTRAM) 50 MG tablet Take 1 tablet (50 mg total) by mouth every 6 (six) hours as needed for severe pain. 12/05/21   Palumbo, April, MD  traZODone (DESYREL) 100 MG tablet Take 1 tablet (100 mg total) by mouth at bedtime. 08/09/21     traZODone (DESYREL)  150 MG tablet SMARTSIG:1-1.5 Tablet(s) By Mouth Every Evening 07/28/21   [provider]  VRAYLAR 1.5 MG capsule SMARTSIG:1 Capsule(s) By Mouth Every Evening 08/01/21   [provider]      Allergies    Penicillins and Sulfa antibiotics    Review of Systems   Review of Systems  Constitutional:  Negative for chills and fever.  Respiratory:  Positive for shortness of breath. Negative for cough, choking and chest tightness.   Cardiovascular:  Positive for leg swelling. Negative for chest pain and palpitations.    Physical Exam Updated Vital Signs BP (!) 143/83   Pulse  88   Temp 97.9 F (36.6 C) (Oral)   Resp (!) 22   LMP 08/31/2022   SpO2 99%  Physical Exam Vitals and nursing note reviewed.  Constitutional:      General: She is not in acute distress.    Appearance: Normal appearance. She is not ill-appearing.  HENT:     Head: Normocephalic and atraumatic.  Eyes:     General: No scleral icterus.    Conjunctiva/sclera: Conjunctivae normal.  Cardiovascular:     Rate and Rhythm: Normal rate and regular rhythm.  Pulmonary:     Effort: Pulmonary effort is normal. No tachypnea or respiratory distress.     Breath sounds: No decreased breath sounds, wheezing or rhonchi.  Chest:     Chest wall: No tenderness.  Musculoskeletal:     Right lower leg: Edema (1+ pitting) present.     Left lower leg: Edema (2+ pitting) present.  Skin:    General: Skin is warm and dry.     Findings: No rash.  Neurological:     Mental Status: She is alert.  Psychiatric:        Mood and Affect: Mood normal.     ED Results / Procedures / Treatments   Labs (all labs ordered are listed, but only abnormal results are displayed) Labs Reviewed  BASIC METABOLIC PANEL - Abnormal; Notable for the following components:      Result Value   Glucose, Bld 118 (*)    All other components within normal limits  CBC  BRAIN NATRIURETIC PEPTIDE  D-DIMER, QUANTITATIVE  TROPONIN I (HIGH  SENSITIVITY)  TROPONIN I (HIGH SENSITIVITY)    EKG None  Radiology DG Chest 2 View  Result Date: 09/07/2022 CLINICAL DATA:  Shortness of breath EXAM: CHEST - 2 VIEW COMPARISON:  08/03/2021 FINDINGS: The heart size and mediastinal contours are within normal limits. Both lungs are clear. The visualized skeletal structures are unremarkable. IMPRESSION: Negative. Electronically Signed   By: Rolm Baptise M.D.   On: 09/07/2022 17:27    Procedures Procedures   Medications Ordered in ED Medications  furosemide (LASIX) injection 40 mg (has no administration in time range)  ipratropium-albuterol (DUONEB) 0.5-2.5 (3) MG/3ML nebulizer solution 3 mL (has no administration in time range)    ED Course/ Medical Decision Making/ A&P                             Medical Decision Making Amount and/or Complexity of Data Reviewed Labs: ordered. Radiology: ordered.  Risk Prescription drug management.   42 year old female presenting with shortness of breath. The emergent differential diagnosis for shortness of breath includes, but is not limited to, Pulmonary edema, bronchoconstriction, Pneumonia, Pulmonary embolism, Pneumotherax/ Hemothorax, Dysrythmia, ACS.     This is not an exhaustive differential.    Past Medical History / Co-morbidities / Social History: Pulmonary edema, peripheral edema, asthma   Additional history: Per chart review patient has been previously prescribed Lasix.  Also has a noted history of pulmonary edema and peripheral edema.  Echocardiogram in 2023 unremarkable   Physical Exam: Pertinent physical exam findings include Pitting edema bilateral lower extremities  Lab Tests: I ordered, and personally interpreted labs.  The pertinent results include: Normal troponin and BNP Negative dimer   Imaging Studies: I ordered and independently visualized and interpreted chest x-ray and I agree with the radiologist that there are no acute findings such as pulmonary edema    Cardiac Monitoring:  The patient  was maintained on a cardiac monitor.  I viewed and interpreted the cardiac monitored which showed an underlying rhythm of: Sinus   Medications: I ordered medication including DuoNeb and Lasix. Reevaluation of the patient after these medicines showed that the patient improved.  Still has some dyspnea however believes that she is feeling better.  Also endorses some anxiety from albuterol inhaler  MDM/Disposition: This is a 42 year old female who presented today due to shortness of breath.  She said it felt like previous times of pulmonary edema.  Pulmonary edema was on the differential as well as pneumonia, pneumothorax and pleural effusion.  X-ray was negative for all of these.  Due to peripheral edema, considered heart failure exacerbation however patient has a normal BNP and had a normal echocardiogram 1 year ago.  Did also consider PE.  Risk factors include body habitus.  No confirmed familial coagulopathy however because of this I did order a D-dimer.  This was negative.  I did engage in shared decision making due to her familial history of PE however patient says that if everything looks okay she would like to be discharged to follow-up outpatient.  I believe this is reasonable with a negative dimer.  EKG nonischemic, regular rate and sinus.  She will be discharged with a refill of her Lasix and outpatient follow-up.  Final Clinical Impression(s) / ED Diagnoses Final diagnoses:  Palpitations  Precordial pain  SOB (shortness of breath) on exertion  Bilateral lower extremity edema    Rx / DC Orders ED Discharge Orders          Ordered    furosemide (LASIX) 20 MG tablet  2 times weekly        09/07/22 1739           Results and diagnoses were explained to the patient. Return precautions discussed in full. Patient had no additional questions and expressed complete understanding.   This chart was dictated using voice recognition software.  Despite  best efforts to proofread,  errors can occur which can change the documentation meaning.    Darliss Ridgel 09/07/22 1849    Tretha Sciara, MD 09/08/22 1124

## 2022-09-07 NOTE — ED Notes (Signed)
Dc instructions reviewed with patient. Patient voiced understanding. Dc with belongings.  °

## 2022-09-07 NOTE — ED Triage Notes (Signed)
Sob x 3 days Getting worse.some swelling in ankles Seen at [facility], sent for eval

## 2022-10-09 NOTE — Progress Notes (Signed)
Cardiology Office Note:    Date:  10/11/2022   ID:  Cynthia Townsend, DOB 04/03/1981, MRN [id number]  PCP:  Wilfred Curtis, MD   [facility] Providers Cardiologist:  None {    Referring MD: Wilfred Curtis, MD    History of Present Illness:    Cynthia Townsend is a 42 y.o. female with a hx of gestational HTN, postpartum preeclampsia with pulmonary edema requiring diuresis, anxiety, and asthma who was previously followed by Dr. Delton See who now presents to clinic for follow-up.  Was last seen in clinic in 08/2021 for palpitations, DOE and atypical, nonexertional chest pain. Cardiac monitor showed NSR with rare ectopy. Patient triggered events correlated with PVCs. TTE showed EF 65-70%, no valve disease. Ca score 0.  Today, the patient states that she is overall doing okay. She had a thyroidectomy and since that time, she has been gaining weight. She has noted worsening dyspnea on exertion since that time which she attributes to the weight gain. Also has noted worsening LE edema and was admitted in 08/2022 for IV diuresis with improvement. BNP at that time normal. Trop negative. She has since increased her lasix to 40mg  daily which has helped some. Complains of mild orthopnea but no significant PND. Has been started on CPAP for OSA.   Has gained about 60lbs since 03/2022. Her synthroid has been uptitrated  Past Medical History:  Diagnosis Date   ADD (attention deficit disorder)    Anemia    post partum   Anemia requiring transfusions    of iron   Anxiety    Asthma    Blood transfusion without reported diagnosis    Carpal tunnel syndrome during pregnancy    Chicken pox    Classic migraine with aura 09/20/2015   Suzette Battiest disease (radial styloid tenosynovitis)    Depression    Ectopic pregnancy    Insomnia    Laceration of finger    left index with nerve involvement   Pneumonia    Postpartum hemorrhage    Pre-eclampsia in postpartum period    Pregnancy induced hypertension     Pulmonary edema    Recurrent upper respiratory infection (URI)    Sciatic pain    Subclinical hypothyroidism    sub-clinical   Urticaria    dogs   Vitamin D deficiency     Past Surgical History:  Procedure Laterality Date   BREAST BIOPSY Right    benign  punch bx   DILATION AND CURETTAGE OF UTERUS     DILATION AND EVACUATION N/A 09/04/2015   Procedure: DILATATION AND EVACUATION;  Surgeon: Mitchel Honour, DO;  Location: [facility];  Service: Gynecology;  Laterality: N/A;   DNC     I & D EXTREMITY Left 04/10/2017   Procedure: LEFT INDEX FINGER WOUND REPAIR AND NERVE REPAIR;  Surgeon: Bradly Bienenstock, MD;  Location: [facility];  Service: Orthopedics;  Laterality: Left;   thyroidectomy      Current Medications: Current Meds  Medication Sig   albuterol (VENTOLIN HFA) 108 (90 Base) MCG/ACT inhaler Inhale 2 puffs into the lungs every 6 (six) hours as needed for wheezing   ALPRAZolam (XANAX) 0.5 MG tablet TAKE 1/2 TABLET BY MOUTH 3 TIMES A DAY AS NEEDED FOR ANXIETY   ALPRAZolam (XANAX) 0.5 MG tablet Take 1 tablet (0.5 mg total) by mouth 3 (three) times daily only if needed   amLODipine (NORVASC) 10 MG tablet Take 1 tablet (10 mg total) by mouth daily.   baclofen (LIORESAL) 10 MG tablet  Take 1 tablet (10 mg total) by mouth 2 (two) times daily as needed.   Cholecalciferol 125 MCG (5000 UT) capsule    dapagliflozin propanediol (FARXIGA) 10 MG TABS tablet Take 1 tablet (10 mg total) by mouth daily before breakfast.   dapagliflozin propanediol (FARXIGA) 10 MG TABS tablet Take 1 tablet (10 mg total) by mouth daily before breakfast.   EPINEPHrine 0.3 mg/0.3 mL IJ SOAJ injection Use device as directed for severe allergic reaction.   furosemide (LASIX) 40 MG tablet Take 1 tablet (40 mg total) by mouth daily.   levonorgestrel (MIRENA) 20 MCG/DAY IUD by Intrauterine route.   loratadine (CLARITIN) 10 MG tablet Take 10 mg by mouth daily as needed for allergies.   potassium chloride SA (KLOR-CON M) 20 MEQ tablet  Take 1 tablet (20 mEq total) by mouth daily.   spironolactone (ALDACTONE) 25 MG tablet Take 1 tablet (25 mg total) by mouth daily.   traZODone (DESYREL) 100 MG tablet Take 1 tablet (100 mg total) by mouth at bedtime.   traZODone (DESYREL) 150 MG tablet SMARTSIG:1-1.5 Tablet(s) By Mouth Every Evening   [DISCONTINUED] amLODipine (NORVASC) 5 MG tablet Take 1 tablet (5 mg total) by mouth daily. Please make overdue appt with Dr. Delton See before anymore refills. 2nd attempt   [DISCONTINUED] furosemide (LASIX) 20 MG tablet Take 1 tablet (20 mg total) by mouth 2 (two) times a week as needed for lower extremity swelling   [DISCONTINUED] potassium chloride SA (KLOR-CON M) 20 MEQ tablet Take 1 tablet (20 mEq total) by mouth twice weekly as needed when you take furosemide   [DISCONTINUED] potassium chloride SA (KLOR-CON M) 20 MEQ tablet Take 1 tablet (20 mEq total) by mouth 2 (two) times daily.     Allergies:   Penicillins and Sulfa antibiotics   Social History   Socioeconomic History   Marital status: Single    Spouse name: Not on file   Number of children: 0   Years of education: 16   Highest education level: Not on file  Occupational History   Occupation: CMA    Comment: Heart Care  Tobacco Use   Smoking status: Former    Types: Cigarettes   Smokeless tobacco: Never  Vaping Use   Vaping Use: Every day  Substance and Sexual Activity   Alcohol use: No   Drug use: No   Sexual activity: Yes    Birth control/protection: I.U.D.  Other Topics Concern   Not on file  Social History Narrative   Lives at home w/ her fiance and new daughter.   Right-handed.   Drinks about 1-2 cups of caffeine per day.   Fun: Hiking, Zumba, sleep   Denies religious beliefs effecting health care.   Denies abuse and feels safe at home.    Social Determinants of Health   Financial Resource Strain: Not on file  Food Insecurity: Not on file  Transportation Needs: Not on file  Physical Activity: Not on file   Stress: Not on file  Social Connections: Not on file     Family History: The patient's family history includes AAA (abdominal aortic aneurysm) in her father; Alcohol abuse in her mother; COPD in her mother; Cancer in her sister; Colon cancer in her paternal uncle; Diabetes in her sister; Heart attack in her father and sister; Heart attack (age of onset: 12) in her maternal grandfather; Heart attack (age of onset: 97) in her sister; Heart disease in her father, mother, and sister; Heart disease (age of onset: 65) in  her maternal uncle; Hyperlipidemia in her father and mother; Hyperthyroidism in her mother; Lung cancer in her mother; Mental illness in her mother and sister; Pulmonary embolism in her father; Rheum arthritis in her maternal grandmother; Transient ischemic attack in her father.  ROS:   Please see the history of present illness.    Review of Systems  Constitutional:  Positive for malaise/fatigue. Negative for weight loss.  HENT:  Negative for congestion and sore throat.   Respiratory:  Positive for shortness of breath. Negative for cough.   Cardiovascular:  Positive for chest pain, palpitations and leg swelling. Negative for orthopnea, claudication and PND.  Gastrointestinal:  Negative for heartburn and nausea.  Genitourinary:  Negative for frequency and urgency.  Musculoskeletal:  Negative for joint pain and myalgias.  Skin:  Negative for itching and rash.  Neurological:  Negative for dizziness, loss of consciousness and headaches.  Endo/Heme/Allergies:  Does not bruise/bleed easily.  Psychiatric/Behavioral:  The patient is nervous/anxious. The patient does not have insomnia.    All other systems reviewed and are negative.  EKGs/Labs/Other Studies Reviewed:    The following studies were reviewed today: TTE 26-Aug-2017: Study Conclusions  - Left ventricle: The cavity size was normal. Wall thickness was    normal. The estimated ejection fraction was 55%. Wall motion was     normal; there were no regional wall motion abnormalities. Left    ventricular diastolic function parameters were normal. Global    strain was only -15%, but strain images were difficult and    probably not accurate.  - Aortic valve: Aortic valve was not visualized in short axis.    There was no stenosis.  - Mitral valve: There was no regurgitation.  - Right ventricle: The cavity size was normal. Systolic function    was normal.  - Pulmonary arteries: No complete TR doppler jet so unable to    estimate PA systolic pressure.  - Inferior vena cava: The vessel was normal in size. The    respirophasic diameter changes were in the normal range (= 50%),    consistent with normal central venous pressure.   EKG:   No new tracing  Recent Labs: 09/07/2022: B Natriuretic Peptide 11.9; BUN 9; Creatinine, Ser 0.60; Hemoglobin 12.8; Platelets 346; Potassium 3.7; Sodium 137  Recent Lipid Panel No results found for: "CHOL", "TRIG", "HDL", "CHOLHDL", "VLDL", "LDLCALC", "LDLDIRECT"          Physical Exam:    VS:  BP (!) 156/92   Pulse 91   Ht 5\' 3"  (1.6 m)   Wt 250 lb (113.4 kg)   SpO2 98%   BMI 44.29 kg/m     Wt Readings from Last 3 Encounters:  10/11/22 250 lb (113.4 kg)  08/08/21 190 lb 9.6 oz (86.5 kg)  08/03/21 190 lb (86.2 kg)     GEN: Well developed, well nourished HEENT: Normal NECK: No JVD; No carotid bruits CARDIAC: RRR, 1/6 systolic murmur, no rubs or gallops RESPIRATORY:  Clear to auscultation without rales, wheezing or rhonchi  ABDOMEN: Soft, non-tender, non-distended MUSCULOSKELETAL:  1+ pitting edema; warm SKIN: Warm and dry NEUROLOGIC:  Alert and oriented x 3 PSYCHIATRIC:  Normal affect   ASSESSMENT:    1. Acute diastolic heart failure (HCC)   2. Dyspnea on exertion   3. Palpitations   4. Chronic hypertension   5. Weight gain   6. Acquired hypothyroidism   7. Heart failure, type unknown (HCC)    PLAN:    In order of problems  listed above:  #Acute  Diastolic HF: -Patient with worsening LE edema, orthopnea with recent admission for IV diuresis concerning for acute diastolic HF in the setting of significant HTN and weight gain -Currently mildly overloaded with NYHA class III symptoms -Will repeat TTE given acute decompensation -Start farxiga 10mg  daily -Start spiro 25mg  daily -Continue lasix 40mg  daily for now and can decrease down to 20mg  daily once volume status improves -Check BMET today and next week for monitoring on new regimen -Management of hypothyroidism/weight gain per primary  #HTN: -Elevated today and running 140-160s at home in the setting of weight gain -Likely contributing to worsening volume status -Will increase amlodipine to 10mg  daily -Start spiro as above -Goal BP <130/90 and if not at goal, will add ARB  #Palpitations: Zio monitor with no arrhythmias and rare ectopy. Symptoms correlated with PVCs. Currently, symptoms improved  #Hypothyroidism: #Weight gain: -Patient s/p thyroidectomy with >60lb weight gain -Synthroid uptitrated -Continue management per PCP -Discussed healthy diet and lifestyle modifications  #History of Pre-eclampsia: #History of Gestational HTN: Higher risk for CV complications -Ca score 0 -Lifestyle modifications as below      Exercise recommendations: Goal of exercising for at least 30 minutes a day, at least 5 times per week.  Please exercise to a moderate exertion.  This means that while exercising it is difficult to speak in full sentences, however you are not so short of breath that you feel you must stop, and not so comfortable that you can carry on a full conversation.  Exertion level should be approximately a 5/10, if 10 is the most exertion you can perform.  Diet recommendations: Recommend a heart healthy diet such as the Mediterranean diet.  This diet consists of plant based foods, healthy fats, lean meats, olive oil.  It suggests limiting the intake of simple carbohydrates  such as white breads, pastries, and pastas.  It also limits the amount of red meat, wine, and dairy products such as cheese that one should consume on a daily basis.     Medication Adjustments/Labs and Tests Ordered: Current medicines are reviewed at length with the patient today.  Concerns regarding medicines are outlined above.  Orders Placed This Encounter  Procedures   Basic Metabolic Panel (BMET)   Basic Metabolic Panel (BMET)   ECHOCARDIOGRAM COMPLETE   Meds ordered this encounter  Medications   dapagliflozin propanediol (FARXIGA) 10 MG TABS tablet    Sig: Take 1 tablet (10 mg total) by mouth daily before breakfast.    Dispense:  30 tablet    Refill:  6   DISCONTD: potassium chloride SA (KLOR-CON M) 20 MEQ tablet    Sig: Take 1 tablet (20 mEq total) by mouth 2 (two) times daily.    Dispense:  90 tablet    Refill:  3    Dosage increased   furosemide (LASIX) 40 MG tablet    Sig: Take 1 tablet (40 mg total) by mouth daily.    Dispense:  90 tablet    Refill:  3    Dosage increased   amLODipine (NORVASC) 10 MG tablet    Sig: Take 1 tablet (10 mg total) by mouth daily.    Dispense:  90 tablet    Refill:  3   spironolactone (ALDACTONE) 25 MG tablet    Sig: Take 1 tablet (25 mg total) by mouth daily.    Dispense:  90 tablet    Refill:  3   dapagliflozin propanediol (FARXIGA) 10 MG TABS tablet  Sig: Take 1 tablet (10 mg total) by mouth daily before breakfast.    Dispense:  28 tablet    Refill:  0    Order Specific Question:   Lot Number?    Answer:   [id number]    Order Specific Question:   Expiration Date?    Answer:   02/07/2025   potassium chloride SA (KLOR-CON M) 20 MEQ tablet    Sig: Take 1 tablet (20 mEq total) by mouth daily.    Dispense:  90 tablet    Refill:  1    Dose increase    Patient Instructions  Medication Instructions:   INCREASE LASIX TO 40 MG DAILY INCREASE AMLODIPINE TO 10 MG DAILY INCREASE POTASSIUM CHLORIDE TO 20 MEQ DAILY START TAKING FARXIGA  10 MG DAILY BEFORE BREAKFAST START TAKING SPIRONOLACTONE 25 MG DAILY  *If you need a refill on your cardiac medications before your next appointment, please call your pharmacy*   Lab Work: 1-TODAY--BMET    2. -IN ONE WEEK REPEAT BMET  If you have labs (blood work) drawn today and your tests are completely normal, you will receive your results only by: MyChart Message (if you have MyChart) OR A paper copy in the mail If you have any lab test that is abnormal or we need to change your treatment, we will call you to review the results.   Testing/Procedures: Your physician has requested that you have an echocardiogram. Echocardiography is a painless test that uses sound waves to create images of your heart. It provides your doctor with information about the size and shape of your heart and how well your heart's chambers and valves are working. This procedure takes approximately one hour. There are no restrictions for this procedure. Please do NOT wear cologne, perfume, aftershave, or lotions (deodorant is allowed). Please arrive 15 minutes prior to your appointment time.    Follow-Up:  3 MONTHS WITH EXTENDER IN THE OFFICE      Meriam Sprague, MD  10/11/2022 9:38 AM     [facility]

## 2022-10-11 ENCOUNTER — Ambulatory Visit: Payer: Medicaid Other | Attending: Cardiology | Admitting: Cardiology

## 2022-10-11 ENCOUNTER — Encounter: Payer: Self-pay | Admitting: Cardiology

## 2022-10-11 VITALS — BP 156/92 | HR 91 | Ht 63.0 in | Wt 250.0 lb

## 2022-10-11 DIAGNOSIS — R002 Palpitations: Secondary | ICD-10-CM

## 2022-10-11 DIAGNOSIS — R0609 Other forms of dyspnea: Secondary | ICD-10-CM

## 2022-10-11 DIAGNOSIS — I5031 Acute diastolic (congestive) heart failure: Secondary | ICD-10-CM

## 2022-10-11 DIAGNOSIS — I10 Essential (primary) hypertension: Secondary | ICD-10-CM

## 2022-10-11 DIAGNOSIS — E039 Hypothyroidism, unspecified: Secondary | ICD-10-CM

## 2022-10-11 DIAGNOSIS — R635 Abnormal weight gain: Secondary | ICD-10-CM

## 2022-10-11 DIAGNOSIS — I509 Heart failure, unspecified: Secondary | ICD-10-CM

## 2022-10-11 LAB — BASIC METABOLIC PANEL
BUN/Creatinine Ratio: 25 — ABNORMAL HIGH (ref 9–23)
BUN: 13 mg/dL (ref 6–24)
CO2: 22 mmol/L (ref 20–29)
Calcium: 9.3 mg/dL (ref 8.7–10.2)
Chloride: 101 mmol/L (ref 96–106)
Creatinine, Ser: 0.52 mg/dL — ABNORMAL LOW (ref 0.57–1.00)
Glucose: 103 mg/dL — ABNORMAL HIGH (ref 70–99)
Potassium: 4.2 mmol/L (ref 3.5–5.2)
Sodium: 137 mmol/L (ref 134–144)
eGFR: 119 mL/min/{1.73_m2} (ref >59)

## 2022-10-11 MED ORDER — POTASSIUM CHLORIDE CRYS ER 20 MEQ PO TBCR: 20.0000 meq | EXTENDED_RELEASE_TABLET | Freq: Two times a day (BID) | ORAL | 3 refills | Status: DC

## 2022-10-11 MED ORDER — FUROSEMIDE 40 MG PO TABS: 40.0000 mg | ORAL_TABLET | Freq: Every day | ORAL | 3 refills | Status: DC

## 2022-10-11 MED ORDER — DAPAGLIFLOZIN PROPANEDIOL 10 MG PO TABS: 10.0000 mg | ORAL_TABLET | Freq: Every day | ORAL | 6 refills | Status: DC

## 2022-10-11 MED ORDER — POTASSIUM CHLORIDE CRYS ER 20 MEQ PO TBCR: 20.0000 meq | EXTENDED_RELEASE_TABLET | Freq: Every day | ORAL | 1 refills | Status: DC

## 2022-10-11 MED ORDER — SPIRONOLACTONE 25 MG PO TABS: 25.0000 mg | ORAL_TABLET | Freq: Every day | ORAL | 3 refills | Status: DC

## 2022-10-11 MED ORDER — DAPAGLIFLOZIN PROPANEDIOL 10 MG PO TABS: 10.0000 mg | ORAL_TABLET | Freq: Every day | ORAL | 0 refills | Status: DC

## 2022-10-11 MED ORDER — AMLODIPINE BESYLATE 10 MG PO TABS: 10.0000 mg | ORAL_TABLET | Freq: Every day | ORAL | 3 refills | Status: DC

## 2022-10-11 NOTE — Patient Instructions (Signed)
Medication Instructions:   INCREASE LASIX TO 40 MG DAILY INCREASE AMLODIPINE TO 10 MG DAILY INCREASE POTASSIUM CHLORIDE TO 20 MEQ DAILY START TAKING FARXIGA 10 MG DAILY BEFORE BREAKFAST START TAKING SPIRONOLACTONE 25 MG DAILY  *If you need a refill on your cardiac medications before your next appointment, please call your pharmacy*   Lab Work: 1-TODAY--BMET    2. -IN ONE WEEK REPEAT BMET  If you have labs (blood work) drawn today and your tests are completely normal, you will receive your results only by: MyChart Message (if you have MyChart) OR A paper copy in the mail If you have any lab test that is abnormal or we need to change your treatment, we will call you to review the results.   Testing/Procedures: Your physician has requested that you have an echocardiogram. Echocardiography is a painless test that uses sound waves to create images of your heart. It provides your doctor with information about the size and shape of your heart and how well your heart's chambers and valves are working. This procedure takes approximately one hour. There are no restrictions for this procedure. Please do NOT wear cologne, perfume, aftershave, or lotions (deodorant is allowed). Please arrive 15 minutes prior to your appointment time.    Follow-Up:  3 MONTHS WITH EXTENDER IN THE OFFICE

## 2022-10-14 ENCOUNTER — Encounter: Payer: Self-pay | Admitting: Cardiology

## 2022-10-15 ENCOUNTER — Other Ambulatory Visit: Payer: Self-pay | Admitting: Cardiology

## 2022-10-15 MED ORDER — CARVEDILOL 6.25 MG PO TABS: 6.2500 mg | ORAL_TABLET | Freq: Two times a day (BID) | ORAL | 3 refills | Status: DC

## 2022-10-21 ENCOUNTER — Other Ambulatory Visit: Payer: Self-pay | Admitting: *Deleted

## 2022-10-21 DIAGNOSIS — I509 Heart failure, unspecified: Secondary | ICD-10-CM

## 2022-10-21 DIAGNOSIS — Z79899 Other long term (current) drug therapy: Secondary | ICD-10-CM

## 2022-10-21 DIAGNOSIS — I5031 Acute diastolic (congestive) heart failure: Secondary | ICD-10-CM

## 2022-10-21 DIAGNOSIS — R0609 Other forms of dyspnea: Secondary | ICD-10-CM

## 2022-10-21 NOTE — Progress Notes (Signed)
BMET order placed for the pt to have done at our [place] location today.  Order released as well.

## 2022-10-22 ENCOUNTER — Encounter: Payer: Self-pay | Admitting: Cardiology

## 2022-10-23 ENCOUNTER — Other Ambulatory Visit (HOSPITAL_COMMUNITY): Payer: Self-pay

## 2022-10-23 ENCOUNTER — Telehealth: Payer: Self-pay

## 2022-10-23 NOTE — Telephone Encounter (Signed)
Pharmacy Patient Advocate Encounter  Prior Authorization for Marcelline Deist has been approved by [employer]).    PA # [id number]  Effective dates: 5.14.24 through 5.15.25

## 2022-10-23 NOTE — Telephone Encounter (Signed)
Pt made aware of PA approval for farxiga.  Sent this information to her via mychart message, so she has for reference when following up with her Pharmacy.

## 2022-10-30 ENCOUNTER — Ambulatory Visit: Payer: Medicaid Other | Admitting: Physician Assistant

## 2022-11-11 LAB — BASIC METABOLIC PANEL
BUN/Creatinine Ratio: 20 (ref 9–23)
BUN: 11 mg/dL (ref 6–24)
CO2: 19 mmol/L — ABNORMAL LOW (ref 20–29)
Calcium: 9.1 mg/dL (ref 8.7–10.2)
Chloride: 103 mmol/L (ref 96–106)
Creatinine, Ser: 0.56 mg/dL — ABNORMAL LOW (ref 0.57–1.00)
Glucose: 172 mg/dL — ABNORMAL HIGH (ref 70–99)
Potassium: 4.4 mmol/L (ref 3.5–5.2)
Sodium: 136 mmol/L (ref 134–144)
eGFR: 117 mL/min/{1.73_m2} (ref >59)

## 2022-11-18 ENCOUNTER — Encounter: Payer: Self-pay | Admitting: Cardiology

## 2022-11-18 ENCOUNTER — Ambulatory Visit (HOSPITAL_COMMUNITY): Payer: Medicaid Other | Attending: Cardiology

## 2022-11-18 DIAGNOSIS — R0609 Other forms of dyspnea: Secondary | ICD-10-CM | POA: Diagnosis present

## 2022-11-18 DIAGNOSIS — E039 Hypothyroidism, unspecified: Secondary | ICD-10-CM | POA: Diagnosis present

## 2022-11-18 DIAGNOSIS — I5031 Acute diastolic (congestive) heart failure: Secondary | ICD-10-CM

## 2022-11-18 DIAGNOSIS — R635 Abnormal weight gain: Secondary | ICD-10-CM | POA: Diagnosis present

## 2022-11-18 DIAGNOSIS — I10 Essential (primary) hypertension: Secondary | ICD-10-CM

## 2022-11-18 DIAGNOSIS — I509 Heart failure, unspecified: Secondary | ICD-10-CM

## 2022-11-18 DIAGNOSIS — R002 Palpitations: Secondary | ICD-10-CM | POA: Diagnosis present

## 2022-11-18 LAB — ECHOCARDIOGRAM COMPLETE
Area-P 1/2: 4.17 cm2
S' Lateral: 2.9 cm

## 2022-11-25 NOTE — Telephone Encounter (Signed)
Called the pt.  She states she has been experiencing worsening LEE and has been managing this on her own by dosing her lasix and potassium.  Pt states she just got back from the beach and was pretty miserable the whole time.  She states she had swelling the whole time which worsened throughout the day.   Pt states with the lower extremity edema she is experiencing sob.    She confirmed she is taking all her cardiac/HF meds.  She has been taking extra lasix and potassium as needed for LEE/wt gain.  Pt states she had to miss work today due to symptoms.   Asked her if she would be available to come in and be seen tomorrow 6/18 at 3:10 pm with Jari Favre PA-C.  Pt asked for a later appt due to childcare, but nothing is available at that time for the whole week.  Advised her that she could bring the kids.   Scheduled the pt to come into the office to be seen tomorrow 6/18 at 3:10 pm.    Pt verbalized understanding and agrees with this plan.  Will send this information to Dr. Shari Prows as an Lorain Childes, to make her aware of this plan.

## 2022-11-26 ENCOUNTER — Encounter: Payer: Self-pay | Admitting: Physician Assistant

## 2022-11-26 ENCOUNTER — Other Ambulatory Visit: Payer: Self-pay

## 2022-11-26 ENCOUNTER — Ambulatory Visit: Payer: Medicaid Other | Attending: Physician Assistant | Admitting: Physician Assistant

## 2022-11-26 VITALS — BP 134/82 | HR 84 | Ht 62.0 in | Wt 257.6 lb

## 2022-11-26 DIAGNOSIS — R0602 Shortness of breath: Secondary | ICD-10-CM | POA: Diagnosis not present

## 2022-11-26 DIAGNOSIS — R600 Localized edema: Secondary | ICD-10-CM

## 2022-11-26 DIAGNOSIS — R072 Precordial pain: Secondary | ICD-10-CM

## 2022-11-26 DIAGNOSIS — R002 Palpitations: Secondary | ICD-10-CM

## 2022-11-26 DIAGNOSIS — E039 Hypothyroidism, unspecified: Secondary | ICD-10-CM

## 2022-11-26 DIAGNOSIS — R42 Dizziness and giddiness: Secondary | ICD-10-CM

## 2022-11-26 DIAGNOSIS — R635 Abnormal weight gain: Secondary | ICD-10-CM

## 2022-11-26 DIAGNOSIS — Z79899 Other long term (current) drug therapy: Secondary | ICD-10-CM

## 2022-11-26 DIAGNOSIS — I5031 Acute diastolic (congestive) heart failure: Secondary | ICD-10-CM

## 2022-11-26 DIAGNOSIS — R0609 Other forms of dyspnea: Secondary | ICD-10-CM

## 2022-11-26 DIAGNOSIS — I509 Heart failure, unspecified: Secondary | ICD-10-CM

## 2022-11-26 DIAGNOSIS — I10 Essential (primary) hypertension: Secondary | ICD-10-CM

## 2022-11-26 DIAGNOSIS — E669 Obesity, unspecified: Secondary | ICD-10-CM

## 2022-11-26 DIAGNOSIS — E66811 Obesity, class 1: Secondary | ICD-10-CM

## 2022-11-26 MED ORDER — POTASSIUM CHLORIDE CRYS ER 20 MEQ PO TBCR: 30.0000 meq | EXTENDED_RELEASE_TABLET | Freq: Every day | ORAL | 3 refills | Status: DC

## 2022-11-26 MED ORDER — AMLODIPINE BESYLATE 5 MG PO TABS: 5.0000 mg | ORAL_TABLET | Freq: Every day | ORAL | 3 refills | Status: DC

## 2022-11-26 MED ORDER — FUROSEMIDE 40 MG PO TABS: 60.0000 mg | ORAL_TABLET | Freq: Every day | ORAL | 3 refills | Status: DC

## 2022-11-26 NOTE — Patient Instructions (Signed)
Medication Instructions:  Your physician has recommended you make the following changes in your medication:  1.Decrease amlodipine to 5 mg daily 2.Increase furosemide to 60 mg daily (this will be 1 and 1/2 of the 40 mg tablets) 3.Increase potassium to 30 meq daily (this will be 1 and 1/2 of the 20 meq tablets) *If you need a refill on your cardiac medications before your next appointment, please call your pharmacy*   Lab Work: BMET, MAG-IN 1 WEEK If you have labs (blood work) drawn today and your tests are completely normal, you will receive your results only by: MyChart Message (if you have MyChart) OR A paper copy in the mail If you have any lab test that is abnormal or we need to change your treatment, we will call you to review the results.   Testing/Procedures: Your physician has requested that you have a carotid duplex. This test is an ultrasound of the carotid arteries in your neck. It looks at blood flow through these arteries that supply the brain with blood. Allow one hour for this exam. There are no restrictions or special instructions.    Follow-Up: At [facility], you and your health needs are our priority.  As part of our continuing mission to provide you with exceptional heart care, we have created designated Provider Care Teams.  These Care Teams include your primary Cardiologist (physician) and Advanced Practice Providers (APPs -  Physician Assistants and Nurse Practitioners) who all work together to provide you with the care you need, when you need it.   Your next appointment:   3-4 month(s)  Provider:   Dr Jacques Navy  Other Instructions Check your blood pressure daily, 1 hr after morning medications for 2 weeks, keep a log and send [place] the readings through mychart at the end of the 2 weeks.   Low-Sodium Eating Plan Salt (sodium) helps you keep a healthy balance of fluids in your body. Too much sodium can raise your blood pressure. It can also cause fluid and  waste to be held in your body. Your health care provider or dietitian may recommend a low-sodium eating plan if you have high blood pressure (hypertension), kidney disease, liver disease, or heart failure. Eating less sodium can help lower your blood pressure and reduce swelling. It can also protect your heart, liver, and kidneys. What are tips for following this plan? Reading food labels  Check food labels for the amount of sodium per serving. If you eat more than one serving, you must multiply the listed amount by the number of servings. Choose foods with less than 140 milligrams (mg) of sodium per serving. Avoid foods with 300 mg of sodium or more per serving. Always check how much sodium is in a product, even if the label says "unsalted" or "no salt added." Shopping  Buy products labeled as "low-sodium" or "no salt added." Buy fresh foods. Avoid canned foods and pre-made or frozen meals. Avoid canned, cured, or processed meats. Buy breads that have less than 80 mg of sodium per slice. Cooking  Eat more home-cooked food. Try to eat less restaurant, buffet, and fast food. Try not to add salt when you cook. Use salt-free seasonings or herbs instead of table salt or sea salt. Check with your provider or pharmacist before using salt substitutes. Cook with plant-based oils, such as canola, sunflower, or olive oil. Meal planning When eating at a restaurant, ask if your food can be made with less salt or no salt. Avoid dishes labeled  as brined, pickled, cured, or smoked. Avoid dishes made with soy sauce, miso, or teriyaki sauce. Avoid foods that have monosodium glutamate (MSG) in them. MSG may be added to some restaurant food, sauces, soups, bouillon, and canned foods. Make meals that can be grilled, baked, poached, roasted, or steamed. These are often made with less sodium. General information Try to limit your sodium intake to 1,500-2,300 mg each day, or the amount told by your  provider. What foods should I eat? Fruits Fresh, frozen, or canned fruit. Fruit juice. Vegetables Fresh or frozen vegetables. "No salt added" canned vegetables. "No salt added" tomato sauce and paste. Low-sodium or reduced-sodium tomato and vegetable juice. Grains Low-sodium cereals, such as oats, puffed wheat and rice, and shredded wheat. Low-sodium crackers. Unsalted rice. Unsalted pasta. Low-sodium bread. Whole grain breads and whole grain pasta. Meats and other proteins Fresh or frozen meat, poultry, seafood, and fish. These should have no added salt. Low-sodium canned tuna and salmon. Unsalted nuts. Dried peas, beans, and lentils without added salt. Unsalted canned beans. Eggs. Unsalted nut butters. Dairy Milk. Soy milk. Cheese that is naturally low in sodium, such as ricotta cheese, fresh mozzarella, or Swiss cheese. Low-sodium or reduced-sodium cheese. Cream cheese. Yogurt. Seasonings and condiments Fresh and dried herbs and spices. Salt-free seasonings. Low-sodium mustard and ketchup. Sodium-free salad dressing. Sodium-free light mayonnaise. Fresh or refrigerated horseradish. Lemon juice. Vinegar. Other foods Homemade, reduced-sodium, or low-sodium soups. Unsalted popcorn and pretzels. Low-salt or salt-free chips. The items listed above may not be all the foods and drinks you can have. Talk to a dietitian to learn more. What foods should I avoid? Vegetables Sauerkraut, pickled vegetables, and relishes. Olives. [place] fries. Onion rings. Regular canned vegetables, except low-sodium or reduced-sodium items. Regular canned tomato sauce and paste. Regular tomato and vegetable juice. Frozen vegetables in sauces. Grains Instant hot cereals. Bread stuffing, pancake, and biscuit mixes. Croutons. Seasoned rice or pasta mixes. Noodle soup cups. Boxed or frozen macaroni and cheese. Regular salted crackers. Self-rising flour. Meats and other proteins Meat or fish that is salted, canned, smoked,  spiced, or pickled. Precooked or cured meat, such as sausages or meat loaves. Cynthia Townsend. Ham. Pepperoni. Hot dogs. Corned beef. Chipped beef. Salt pork. Jerky. Pickled herring, anchovies, and sardines. Regular canned tuna. Salted nuts. Dairy Processed cheese and cheese spreads. Hard cheeses. Cheese curds. Blue cheese. Feta cheese. String cheese. Regular cottage cheese. Buttermilk. Canned milk. Fats and oils Salted butter. Regular margarine. Ghee. Bacon fat. Seasonings and condiments Onion salt, garlic salt, seasoned salt, table salt, and sea salt. Canned and packaged gravies. Worcestershire sauce. Tartar sauce. Barbecue sauce. Teriyaki sauce. Soy sauce, including reduced-sodium soy sauce. Steak sauce. Fish sauce. Oyster sauce. Cocktail sauce. Horseradish that you find on the shelf. Regular ketchup and mustard. Meat flavorings and tenderizers. Bouillon cubes. Hot sauce. Pre-made or packaged marinades. Pre-made or packaged taco seasonings. Relishes. Regular salad dressings. Salsa. Other foods Salted popcorn and pretzels. Corn chips and puffs. Potato and tortilla chips. Canned or dried soups. Pizza. Frozen entrees and pot pies. The items listed above may not be all the foods and drinks you should avoid. Talk to a dietitian to learn more. This information is not intended to replace advice given to you by your health care provider. Make sure you discuss any questions you have with your health care provider. Document Revised: 06/13/2022 Document Reviewed: 06/13/2022 Elsevier Patient Education  2024 [employer]   Heart-Healthy Eating Plan Many factors influence your heart health, including eating and exercise  habits. Heart health is also called coronary health. Coronary risk increases with abnormal blood fat (lipid) levels. A heart-healthy eating plan includes limiting unhealthy fats, increasing healthy fats, limiting salt (sodium) intake, and making other diet and lifestyle changes. What is my plan? Your  health care provider may recommend that: You limit your fat intake to _________% or less of your total calories each day. You limit your saturated fat intake to _________% or less of your total calories each day. You limit the amount of cholesterol in your diet to less than _________ mg per day. You limit the amount of sodium in your diet to less than _________ mg per day. What are tips for following this plan? Cooking Cook foods using methods other than frying. Baking, boiling, grilling, and broiling are all good options. Other ways to reduce fat include: Removing the skin from poultry. Removing all visible fats from meats. Steaming vegetables in water or broth. Meal planning  At meals, imagine dividing your plate into fourths: Fill one-half of your plate with vegetables and green salads. Fill one-fourth of your plate with whole grains. Fill one-fourth of your plate with lean protein foods. Eat 2-4 cups of vegetables per day. One cup of vegetables equals 1 cup (91 g) broccoli or cauliflower florets, 2 medium carrots, 1 large bell pepper, 1 large sweet potato, 1 large tomato, 1 medium white potato, 2 cups (150 g) raw leafy greens. Eat 1-2 cups of fruit per day. One cup of fruit equals 1 small apple, 1 large banana, 1 cup (237 g) mixed fruit, 1 large orange,  cup (82 g) dried fruit, 1 cup (240 mL) 100% fruit juice. Eat more foods that contain soluble fiber. Examples include apples, broccoli, carrots, beans, peas, and barley. Aim to get 25-30 g of fiber per day. Increase your consumption of legumes, nuts, and seeds to 4-5 servings per week. One serving of dried beans or legumes equals  cup (90 g) cooked, 1 serving of nuts is  oz (12 almonds, 24 pistachios, or 7 walnut halves), and 1 serving of seeds equals  oz (8 g). Fats Choose healthy fats more often. Choose monounsaturated and polyunsaturated fats, such as olive and canola oils, avocado oil, flaxseeds, walnuts, almonds, and seeds. Eat  more omega-3 fats. Choose salmon, mackerel, sardines, tuna, flaxseed oil, and ground flaxseeds. Aim to eat fish at least 2 times each week. Check food labels carefully to identify foods with trans fats or high amounts of saturated fat. Limit saturated fats. These are found in animal products, such as meats, butter, and cream. Plant sources of saturated fats include palm oil, palm kernel oil, and coconut oil. Avoid foods with partially hydrogenated oils in them. These contain trans fats. Examples are stick margarine, some tub margarines, cookies, crackers, and other baked goods. Avoid fried foods. General information Eat more home-cooked food and less restaurant, buffet, and fast food. Limit or avoid alcohol. Limit foods that are high in added sugar and simple starches such as foods made using white refined flour (white breads, pastries, sweets). Lose weight if you are overweight. Losing just 5-10% of your body weight can help your overall health and prevent diseases such as diabetes and heart disease. Monitor your sodium intake, especially if you have high blood pressure. Talk with your health care provider about your sodium intake. Try to incorporate more vegetarian meals weekly. What foods should I eat? Fruits All fresh, canned (in natural juice), or frozen fruits. Vegetables Fresh or frozen vegetables (raw, steamed, roasted,  or grilled). Green salads. Grains Most grains. Choose whole wheat and whole grains most of the time. Rice and pasta, including brown rice and pastas made with whole wheat. Meats and other proteins Lean, well-trimmed beef, veal, pork, and lamb. Chicken and [place] without skin. All fish and shellfish. Wild duck, rabbit, pheasant, and venison. Egg whites or low-cholesterol egg substitutes. Dried beans, peas, lentils, and tofu. Seeds and most nuts. Dairy Low-fat or nonfat cheeses, including ricotta and mozzarella. Skim or 1% milk (liquid, powdered, or evaporated). Buttermilk  made with low-fat milk. Nonfat or low-fat yogurt. Fats and oils Non-hydrogenated (trans-free) margarines. Vegetable oils, including soybean, sesame, sunflower, olive, avocado, peanut, safflower, corn, canola, and cottonseed. Salad dressings or mayonnaise made with a vegetable oil. Beverages Water (mineral or sparkling). Coffee and tea. Unsweetened ice tea. Diet beverages. Sweets and desserts Sherbet, gelatin, and fruit ice. Small amounts of dark chocolate. Limit all sweets and desserts. Seasonings and condiments All seasonings and condiments. The items listed above may not be a complete list of foods and beverages you can eat. Contact a dietitian for more options. What foods should I avoid? Fruits Canned fruit in heavy syrup. Fruit in cream or butter sauce. Fried fruit. Limit coconut. Vegetables Vegetables cooked in cheese, cream, or butter sauce. Fried vegetables. Grains Breads made with saturated or trans fats, oils, or whole milk. Croissants. Sweet rolls. Donuts. High-fat crackers, such as cheese crackers and chips. Meats and other proteins Fatty meats, such as hot dogs, ribs, sausage, bacon, rib-eye roast or steak. High-fat deli meats, such as salami and bologna. Caviar. Domestic duck and goose. Organ meats, such as liver. Dairy Cream, sour cream, cream cheese, and creamed cottage cheese. Whole-milk cheeses. Whole or 2% milk (liquid, evaporated, or condensed). Whole buttermilk. Cream sauce or high-fat cheese sauce. Whole-milk yogurt. Fats and oils Meat fat, or shortening. Cocoa butter, hydrogenated oils, palm oil, coconut oil, palm kernel oil. Solid fats and shortenings, including bacon fat, salt pork, lard, and butter. Nondairy cream substitutes. Salad dressings with cheese or sour cream. Beverages Regular sodas and any drinks with added sugar. Sweets and desserts Frosting. Pudding. Cookies. Cakes. Pies. Milk chocolate or white chocolate. Buttered syrups. Full-fat ice cream or ice  cream drinks. The items listed above may not be a complete list of foods and beverages to avoid. Contact a dietitian for more information. Summary Heart-healthy meal planning includes limiting unhealthy fats, increasing healthy fats, limiting salt (sodium) intake and making other diet and lifestyle changes. Lose weight if you are overweight. Losing just 5-10% of your body weight can help your overall health and prevent diseases such as diabetes and heart disease. Focus on eating a balance of foods, including fruits and vegetables, low-fat or nonfat dairy, lean protein, nuts and legumes, whole grains, and heart-healthy oils and fats. This information is not intended to replace advice given to you by your health care provider. Make sure you discuss any questions you have with your health care provider. Document Revised: 07/02/2021 Document Reviewed: 07/02/2021 Elsevier Patient Education  2024 [employer].

## 2022-11-26 NOTE — Progress Notes (Signed)
Cardiology Office Note:  .   Date:  11/26/2022  ID:  [name], DOB 1981/01/16, MRN [id number] PCP: Wilfred Curtis, MD  [facility] Providers Cardiologist:  None { History of Present Illness: Cynthia Townsend is a 42 y.o. female with a past medical history of gestational hypertension, postpartum preeclampsia with pulmonary edema requiring diuresis, anxiety, and asthma who presents for follow-up.  Seen in the clinic March 2023 for palpitations, DOE and atypical nonexertional chest pain.  Cardiac monitor showed normal sinus rhythm with rare ectopy.  Patient triggered events correlated with PVCs.  TTE showed EF 65 to 70%, no valve disease, CVA score 0.  Was seen by Dr. Shari Prows Oct 11, 2022 and at that time was doing okay.  Had a thyroidectomy and since that time has been gaining weight.  She had noted worsening dyspnea on exertion since that time which she attributed to weight gain.  Noted worsening lower extremity edema.  Admitted 08/2022 for IV diuresis with improvement.  BNP was normal at that time.  Troponin negative.  She had since increased her Lasix to 40 mg daily which helped some.  Complaining of mild orthopnea but no significant PND.  Had been started on CPAP for OSA.  Today, she tells me she went to ER, pitting edema never goes away. Getting dressed going to pass out. Thyroid taken out. So tired all the time. She has had some weight gain, 60 lbs in 8 months. She will see [facility], July 11th. She already had her cortisol checked and it was normal. Levothyroxin 200 mcg was added . Carvediol has helped with the racing heart rate. She is still struggling with LE edema.  We discussed increasing her Lasix in the good decreasing her amlodipine.  We also decided on checking her kidneys in a week.  Reports no shortness of breath nor dyspnea on exertion. Reports no chest pain, pressure, or tightness. No edema, orthopnea, PND. Reports no palpitations.    ROS: Please see HPI for  pertinent ROS  Studies Reviewed: Marland Kitchen        TTE 07/2017: Study Conclusions  - Left ventricle: The cavity size was normal. Wall thickness was    normal. The estimated ejection fraction was 55%. Wall motion was    normal; there were no regional wall motion abnormalities. Left    ventricular diastolic function parameters were normal. Global    strain was only -15%, but strain images were difficult and    probably not accurate.  - Aortic valve: Aortic valve was not visualized in short axis.    There was no stenosis.  - Mitral valve: There was no regurgitation.  - Right ventricle: The cavity size was normal. Systolic function    was normal.  - Pulmonary arteries: No complete TR doppler jet so unable to    estimate PA systolic pressure.  - Inferior vena cava: The vessel was normal in size. The    respirophasic diameter changes were in the normal range (= 50%),    consistent with normal central venous pressure.       Physical Exam:   VS:  BP 134/82   Pulse 84   Ht 5\' 2"  (1.575 m)   Wt 257 lb 9.6 oz (116.8 kg)   SpO2 97%   BMI 47.12 kg/m    Wt Readings from Last 3 Encounters:  11/26/22 257 lb 9.6 oz (116.8 kg)  10/11/22 250 lb (113.4 kg)  08/08/21 190 lb 9.6 oz (86.5 kg)  GEN: Well nourished, well developed in no acute distress NECK: No JVD; No carotid bruits CARDIAC: RRR, no murmurs, rubs, gallops RESPIRATORY:  Clear to auscultation without rales, wheezing or rhonchi  ABDOMEN: Soft, non-tender, non-distended EXTREMITIES:  No edema; No deformity   ASSESSMENT AND PLAN: .   1.  Acute diastolic heart failure -echo reviewed with the patient -Still struggling with lower extremity edema, increase Lasix to 60 mg daily and decreased her amlodipine to 5 mg daily, and increase potassium to 30 mEq -Will plan to check a BMP -check carotid US for dizziness with head turn  2.  HTN -Blood pressure well-controlled today 134/82 -Continue to check blood pressure daily an hour after  morning medications  3.  Palpitations -This is improved significantly since the addition of carvedilol -Continue current medications  4.  Hypothyroidism/weight gain -She was started on levothyroxine 200 mcg -She is followed by an endocrinologist for further workup -She was just on vacation and is currently working on a heart healthy, low-sodium diet  5.  History of preeclampsia and gestational hypertension      Dispo: Plan to come back and see Dr. Jacques Navy in about 6 months, new consult  Signed, Sharlene Dory, PA-C

## 2022-11-29 ENCOUNTER — Ambulatory Visit (HOSPITAL_COMMUNITY)
Admission: RE | Admit: 2022-11-29 | Discharge: 2022-11-29 | Disposition: A | Discharge status: Home / Self Care | Payer: Medicaid Other | Source: Ambulatory Visit | Attending: Cardiovascular Disease | Admitting: Cardiovascular Disease

## 2022-11-29 DIAGNOSIS — R42 Dizziness and giddiness: Secondary | ICD-10-CM | POA: Diagnosis not present

## 2023-01-06 ENCOUNTER — Emergency Department (HOSPITAL_COMMUNITY)
Admission: EM | Admit: 2023-01-06 | Discharge: 2023-01-07 | Disposition: A | Discharge status: Home / Self Care | Payer: Medicaid Other | Attending: Emergency Medicine | Admitting: Emergency Medicine

## 2023-01-06 ENCOUNTER — Other Ambulatory Visit: Payer: Self-pay

## 2023-01-06 ENCOUNTER — Emergency Department (HOSPITAL_COMMUNITY): Payer: Medicaid Other

## 2023-01-06 ENCOUNTER — Encounter (HOSPITAL_COMMUNITY): Payer: Self-pay

## 2023-01-06 DIAGNOSIS — I509 Heart failure, unspecified: Secondary | ICD-10-CM | POA: Diagnosis not present

## 2023-01-06 DIAGNOSIS — E119 Type 2 diabetes mellitus without complications: Secondary | ICD-10-CM | POA: Insufficient documentation

## 2023-01-06 DIAGNOSIS — Z7984 Long term (current) use of oral hypoglycemic drugs: Secondary | ICD-10-CM | POA: Diagnosis not present

## 2023-01-06 DIAGNOSIS — R0609 Other forms of dyspnea: Secondary | ICD-10-CM | POA: Diagnosis not present

## 2023-01-06 DIAGNOSIS — R55 Syncope and collapse: Secondary | ICD-10-CM | POA: Diagnosis present

## 2023-01-06 DIAGNOSIS — E871 Hypo-osmolality and hyponatremia: Secondary | ICD-10-CM | POA: Diagnosis not present

## 2023-01-06 DIAGNOSIS — R600 Localized edema: Secondary | ICD-10-CM | POA: Diagnosis not present

## 2023-01-06 HISTORY — DX: Heart failure, unspecified: I50.9

## 2023-01-06 LAB — BASIC METABOLIC PANEL
Anion gap: 13 (ref 5–15)
BUN: 16 mg/dL (ref 6–20)
CO2: 20 mmol/L — ABNORMAL LOW (ref 22–32)
Calcium: 9.3 mg/dL (ref 8.9–10.3)
Chloride: 100 mmol/L (ref 98–111)
Creatinine, Ser: 0.73 mg/dL (ref 0.44–1.00)
GFR, Estimated: >60 mL/min (ref >60)
Glucose, Bld: 148 mg/dL — ABNORMAL HIGH (ref 70–99)
Potassium: 3.4 mmol/L — ABNORMAL LOW (ref 3.5–5.1)
Sodium: 133 mmol/L — ABNORMAL LOW (ref 135–145)

## 2023-01-06 LAB — HCG, SERUM, QUALITATIVE: Preg, Serum: NEGATIVE

## 2023-01-06 LAB — CBC
HCT: 42.1 % (ref 36.0–46.0)
Hemoglobin: 13.6 g/dL (ref 12.0–15.0)
MCH: 26.2 pg (ref 26.0–34.0)
MCHC: 32.3 g/dL (ref 30.0–36.0)
MCV: 81.1 fL (ref 80.0–100.0)
Platelets: 391 10*3/uL (ref 150–400)
RBC: 5.19 MIL/uL — ABNORMAL HIGH (ref 3.87–5.11)
RDW: 14.1 % (ref 11.5–15.5)
WBC: 12.5 10*3/uL — ABNORMAL HIGH (ref 4.0–10.5)
nRBC: 0 % (ref 0.0–0.2)

## 2023-01-06 LAB — TROPONIN I (HIGH SENSITIVITY): Troponin I (High Sensitivity): 6 ng/L (ref <18)

## 2023-01-06 LAB — BRAIN NATRIURETIC PEPTIDE: B Natriuretic Peptide: 24.6 pg/mL (ref 0.0–100.0)

## 2023-01-06 NOTE — ED Triage Notes (Signed)
Pt arrives with c/o increased SOB over the last 1-2 days and dizziness. Per pt, she is having difficulty laying flat. Pt endorses dizziness, CP, and palpitations. Pt recently diagnosed with HF in May.

## 2023-01-07 ENCOUNTER — Emergency Department (HOSPITAL_COMMUNITY): Payer: Medicaid Other

## 2023-01-07 LAB — HEPATIC FUNCTION PANEL
ALT: 58 U/L — ABNORMAL HIGH (ref 0–44)
AST: 28 U/L (ref 15–41)
Albumin: 4 g/dL (ref 3.5–5.0)
Alkaline Phosphatase: 113 U/L (ref 38–126)
Bilirubin, Direct: <0.1 mg/dL (ref 0.0–0.2)
Total Bilirubin: 0.5 mg/dL (ref 0.3–1.2)
Total Protein: 8 g/dL (ref 6.5–8.1)

## 2023-01-07 MED ORDER — IOHEXOL 350 MG/ML SOLN
75.0000 mL | Freq: Once | INTRAVENOUS | Status: AC | PRN
  Administered 2023-01-07: 75 mL via INTRAVENOUS

## 2023-01-07 NOTE — ED Provider Notes (Signed)
[place] EMERGENCY DEPARTMENT AT [facility] Provider Note   CSN: [id number] Arrival date & time: 01/06/23  2118     History  Chief Complaint  Patient presents with   Shortness of Breath    Cynthia Townsend is a 42 y.o. female.  The history is provided by the patient and medical records.  Shortness of Breath Cynthia Townsend is a 42 y.o. female who presents to the Emergency Department complaining of near syncope.  She presents to the emergency department following a episode of near syncope that occurred today.  She reports dyspnea on exertion as well as pounding in her chest.  She noted her heart rate was elevated to 130 and she took an extra carvedilol.  She has been experiencing issues with shortness of breath and lower extremity edema since April.  She had a total thyroidectomy in February of this year.  Overall her lower extremity edema has been improved for her.  She also reports associated abdominal bloating.  She has a history of CHF, diabetes.  She did start on metformin 1 week ago.  She has a family history of PE in her father and sister.  She is unaware of hereditary clotting disorders in her family.  She does have a Mirena IUD.  No tobacco use.  She took one of her home Lasix today.      Home Medications Prior to Admission medications   Medication Sig Start Date End Date Taking? Authorizing Provider  albuterol (VENTOLIN HFA) 108 (90 Base) MCG/ACT inhaler Inhale 2 puffs into the lungs every 6 (six) hours as needed for wheezing 08/22/21     ALPRAZolam (XANAX) 0.5 MG tablet Take 1 tablet (0.5 mg total) by mouth 3 (three) times daily only if needed 09/19/21     amLODipine (NORVASC) 5 MG tablet Take 1 tablet (5 mg total) by mouth daily. 11/26/22   Sharlene Dory, PA-C  baclofen (LIORESAL) 10 MG tablet Take 1 tablet (10 mg total) by mouth 2 (two) times daily as needed. 04/11/22     carvedilol (COREG) 6.25 MG tablet Take 1 tablet (6.25 mg total) by mouth 2 (two) times daily.  10/15/22   Meriam Sprague, MD  Cholecalciferol 125 MCG (5000 UT) capsule     [provider]  dapagliflozin propanediol (FARXIGA) 10 MG TABS tablet Take 1 tablet (10 mg total) by mouth daily before breakfast. 10/11/22   Meriam Sprague, MD  EPINEPHrine 0.3 mg/0.3 mL IJ SOAJ injection Use device as directed for severe allergic reaction. 09/12/21     furosemide (LASIX) 40 MG tablet Take 1.5 tablets (60 mg total) by mouth daily. 11/26/22   Sharlene Dory, PA-C  levonorgestrel (MIRENA) 20 MCG/DAY IUD by Intrauterine route. 12/04/20   [provider]  loratadine (CLARITIN) 10 MG tablet Take 10 mg by mouth daily as needed for allergies.    [provider]  potassium chloride SA (KLOR-CON M) 20 MEQ tablet Take 1.5 tablets (30 mEq total) by mouth daily. 11/26/22   Sharlene Dory, PA-C  spironolactone (ALDACTONE) 25 MG tablet Take 1 tablet (25 mg total) by mouth daily. 10/11/22   Meriam Sprague, MD  traZODone (DESYREL) 100 MG tablet Take 1 tablet (100 mg total) by mouth at bedtime. Patient taking differently: Take 200 mg by mouth at bedtime. 08/09/21     traZODone (DESYREL) 150 MG tablet SMARTSIG:1-1.5 Tablet(s) By Mouth Every Evening 07/28/21   [provider]      Allergies  Penicillins and Sulfa antibiotics    Review of Systems   Review of Systems  Respiratory:  Positive for shortness of breath.   All other systems reviewed and are negative.   Physical Exam Updated Vital Signs BP (!) 143/78   Pulse 79   Temp 98.3 F (36.8 C) (Oral)   Resp (!) 25   Ht 5\' 2"  (1.575 m)   Wt 117.9 kg   SpO2 93%   BMI 47.55 kg/m  Physical Exam Vitals and nursing note reviewed.  Constitutional:      Appearance: She is well-developed.  HENT:     Head: Normocephalic and atraumatic.  Cardiovascular:     Rate and Rhythm: Normal rate and regular rhythm.     Heart sounds: No murmur heard. Pulmonary:     Effort: Pulmonary effort is normal. No respiratory distress.      Breath sounds: Normal breath sounds.  Abdominal:     Palpations: Abdomen is soft.     Tenderness: There is no abdominal tenderness. There is no guarding or rebound.  Musculoskeletal:        General: No tenderness.     Comments: Trace pitting edema to BLE.  Skin:    General: Skin is warm and dry.  Neurological:     Mental Status: She is alert and oriented to person, place, and time.  Psychiatric:        Behavior: Behavior normal.     ED Results / Procedures / Treatments   Labs (all labs ordered are listed, but only abnormal results are displayed) Labs Reviewed  BASIC METABOLIC PANEL - Abnormal; Notable for the following components:      Result Value   Sodium 133 (*)    Potassium 3.4 (*)    CO2 20 (*)    Glucose, Bld 148 (*)    All other components within normal limits  CBC - Abnormal; Notable for the following components:   WBC 12.5 (*)    RBC 5.19 (*)    All other components within normal limits  HEPATIC FUNCTION PANEL - Abnormal; Notable for the following components:   ALT 58 (*)    All other components within normal limits  HCG, SERUM, QUALITATIVE  BRAIN NATRIURETIC PEPTIDE  TROPONIN I (HIGH SENSITIVITY)  TROPONIN I (HIGH SENSITIVITY)    EKG EKG Interpretation Date/Time:  Monday January 06 2023 21:54:47 EDT Ventricular Rate:  90 PR Interval:  146 QRS Duration:  82 QT Interval:  364 QTC Calculation: 445 R Axis:   55  Text Interpretation: Normal sinus rhythm Normal ECG Confirmed by Tilden Fossa [phone]) on 01/07/2023 1:32:15 AM  Radiology CT Angio Chest PE W/Cm &/Or Wo Cm  Result Date: 01/07/2023 CLINICAL DATA:  Pulmonary embolism suspected, high probability. Increasing shortness of breath and dizziness. Chest pain and palpitations. EXAM: CT ANGIOGRAPHY CHEST WITH CONTRAST TECHNIQUE: Multidetector CT imaging of the chest was performed using the standard protocol during bolus administration of intravenous contrast. Multiplanar CT image reconstructions and MIPs  were obtained to evaluate the vascular anatomy. RADIATION DOSE REDUCTION: This exam was performed according to the departmental dose-optimization program which includes automated exposure control, adjustment of the mA and/or kV according to patient size and/or use of iterative reconstruction technique. CONTRAST:  75mL OMNIPAQUE IOHEXOL 350 MG/ML SOLN COMPARISON:  10/10/2021. FINDINGS: Cardiovascular: The heart is normal in size and there is a trace pericardial effusion. The aorta and pulmonary trunk are normal in caliber. No definite evidence of pulmonary embolism. Examination is limited due to mixing  artifact. Mediastinum/Nodes: No mediastinal, hilar, or axillary lymphadenopathy. Thyroid gland is not seen and surgical clips are present in the thyroid bed. The trachea and esophagus are within normal limits. Lungs/Pleura: Mild atelectasis is present at the lung bases. No consolidation, effusion, or pneumothorax. Upper Abdomen: Fatty infiltration of the liver is noted. No acute abnormality. Musculoskeletal: No acute osseous abnormality. Review of the MIP images confirms the above findings. IMPRESSION: 1. No definite evidence of pulmonary embolism. Examination is limited due to motion artifact. 2. No acute process in the chest. 3. Hepatic steatosis. Electronically Signed   By: Thornell Sartorius M.D.   On: 01/07/2023 02:05   DG Chest 2 View  Result Date: 01/06/2023 CLINICAL DATA:  Chest pain and shortness of breath. EXAM: CHEST - 2 VIEW COMPARISON:  Chest radiograph dated 09/07/2022. FINDINGS: The heart size and mediastinal contours are within normal limits. Both lungs are clear. The visualized skeletal structures are unremarkable. IMPRESSION: No active cardiopulmonary disease. Electronically Signed   By: Elgie Collard M.D.   On: 01/06/2023 22:42    Procedures Procedures    Medications Ordered in ED Medications  iohexol (OMNIPAQUE) 350 MG/ML injection 75 mL (75 mLs Intravenous Contrast Given 01/07/23 0150)     ED Course/ Medical Decision Making/ A&P                             Medical Decision Making Amount and/or Complexity of Data Reviewed Labs: ordered. Radiology: ordered.  Risk Prescription drug management.   Pt with hx/o CHF here for evaluation of DOE, near syncope today.  Most recent EF on outpatient echo in June 1960 percent.  On examination patient has clear lung sounds bilaterally with no respiratory distress.  She is able to ambulate without any hypoxia.  EKG was sinus rhythm.  Troponin is negative.  BNP is within normal limits.  BMP with mild hyponatremia.  Given her family history of PE a CTA was obtained, which is negative for pneumonia or pulmonary edema or PE.  Discussed with patient unclear source of her near syncopal event as well as shortness of breath.  Feel she is stable at this point for outpatient PCP/cardiology follow-up as well as return precautions.        Final Clinical Impression(s) / ED Diagnoses Final diagnoses:  Dyspnea on exertion  Near syncope    Rx / DC Orders ED Discharge Orders     None         Tilden Fossa, MD 01/07/23 [phone]

## 2023-01-07 NOTE — ED Notes (Signed)
Ambulated pt with pulse ox. O2 sat remained around 95 while ambulating. Pt said her shortness of breath was worse while walking

## 2023-01-17 NOTE — Progress Notes (Deleted)
Cardiology Office Note:  .   Date:  01/17/2023  ID:  [name], DOB [birth year], MRN [id number] PCP: Wilfred Curtis, MD  [facility] Providers Cardiologist:  None { Click to update primary MD,subspecialty MD or APP then REFRESH:1}   Patient Profile: .      PMH Gestational hypertension Postpartum preeclampsia with pulmonary edema requiring diuresis Anxiety Asthma Palpitations Family hx premature CAD Bulimia  Lasted 30 years starting at age 38  Seen by Dr. Shari Prows 08/08/2021 for evaluation of palpitations.  She reported these occurring frequently, almost every other day and worse when laying down at night.  She tried the EKG function of her friends smart watch and it reported A-fib 2-3 times.  She has associated shortness of breath but no lightheadedness or syncope.  Has been on Adderall for years.  Also reports shortness of breath with exertion believed to be secondary to weight gain.  This has progressively worsened and she now feels winded with simple exertion or with talking.  Occasional chest pain left and central chest described as squeezing or pinching.  Chest pain occurs randomly, regardless of exertion.  Had recently noticed pitting edema in her legs.  In ED, troponins were negative, BNP was normal and D-dimer was negative.  She was started on Wegovy for weight loss following pregnancies.  Cardiac monitor 08/2021 revealed dominant rhythm NSR with average HR 88 bpm, rare SVE, patient triggered events correlate with PVCs, no significant arrhythmias or pauses.  Advised she could try metoprolol 25 mg twice daily for symptoms.  Echo 09/30/2021 revealed normal LVEF 65 to 70%, normal diastolic pattern, no significant valve disease.  CT cardiac score revealed calcium score of 0 Agatston units.   Seen by Dr. Shari Prows 10/11/22 following thyroidectomy.  She reported she had been gaining weight and noted worsening dyspnea on exertion since that time.  Also noted worsening lower extremity  edema.  Admitted 08/2022 for IV diuresis with improvement.  BNP was normal at that time.  Troponin was negative.  Lasix was increased to 40 mg daily which helped some.  She was started on CPAP for OSA.  Last cardiology clinic visit was 11/26/2022 with Jari Favre, PA.  She reported she continued to be tired all of the time.  Cortisol level was checked and it was normal.  Carvedilol helped with racing heart rate.  She continued to struggle with LE edema.  Lasix was increased to 60 mg daily and amlodipine was decreased to 5 mg daily.  She is on potassium supplementation.  TTE completed 11/18/2022 revealed normal LVEF 60 to 65%, no RWMA, normal diastolic parameters, normal RV, no significant valve disease.  Carotid duplex was completed for dizziness when turning head. No evidence of stenosis in neck arteries.        History of Present Illness: Rye Dwelle is a *** 42 y.o. female who is here today for follow-up. Seen in ED 01/07/23 for near syncope. BNP, troponin were normal. BMP revealed mild hyponatremia. CTA negative. EKG was unremarkable.   ROS: ***       Studies Reviewed: .        *** Risk Assessment/Calculations:   {Does this patient have ATRIAL FIBRILLATION?:[phone]} No BP recorded.  {Refresh Note OR Click here to enter BP  :1}***       Physical Exam:   VS:  There were no vitals taken for this visit.   Wt Readings from Last 3 Encounters:  01/07/23 260 lb (117.9 kg)  11/26/22 257  lb 9.6 oz (116.8 kg)  10/11/22 250 lb (113.4 kg)    GEN: Well nourished, well developed in no acute distress NECK: No JVD; No carotid bruits CARDIAC: ***RRR, no murmurs, rubs, gallops RESPIRATORY:  Clear to auscultation without rales, wheezing or rhonchi  ABDOMEN: Soft, non-tender, non-distended EXTREMITIES:  No edema; No deformity     ASSESSMENT AND PLAN: .    LE Edema: Palpitations: Near syncope: Hypertension:     {Are you ordering a CV Procedure (e.g. stress test, cath, DCCV, TEE, etc)?    Press F2        :[id number]}  Dispo: ***  Signed, Eligha Bridegroom, NP-C

## 2023-01-20 ENCOUNTER — Ambulatory Visit: Payer: Medicaid Other | Admitting: Nurse Practitioner

## 2023-01-27 ENCOUNTER — Other Ambulatory Visit: Payer: Self-pay

## 2023-01-27 ENCOUNTER — Emergency Department (HOSPITAL_COMMUNITY): Payer: Medicaid Other

## 2023-01-27 ENCOUNTER — Encounter (HOSPITAL_COMMUNITY): Payer: Self-pay

## 2023-01-27 ENCOUNTER — Emergency Department (HOSPITAL_COMMUNITY)
Admission: EM | Admit: 2023-01-27 | Discharge: 2023-01-28 | Disposition: A | Discharge status: Other Acute Inpatient Hospital | Payer: Medicaid Other | Attending: Emergency Medicine | Admitting: Emergency Medicine

## 2023-01-27 DIAGNOSIS — Z794 Long term (current) use of insulin: Secondary | ICD-10-CM | POA: Insufficient documentation

## 2023-01-27 DIAGNOSIS — F332 Major depressive disorder, recurrent severe without psychotic features: Secondary | ICD-10-CM | POA: Diagnosis not present

## 2023-01-27 DIAGNOSIS — R45851 Suicidal ideations: Secondary | ICD-10-CM

## 2023-01-27 DIAGNOSIS — T424X2A Poisoning by benzodiazepines, intentional self-harm, initial encounter: Secondary | ICD-10-CM | POA: Insufficient documentation

## 2023-01-27 DIAGNOSIS — F329 Major depressive disorder, single episode, unspecified: Secondary | ICD-10-CM | POA: Diagnosis not present

## 2023-01-27 DIAGNOSIS — F909 Attention-deficit hyperactivity disorder, unspecified type: Secondary | ICD-10-CM | POA: Diagnosis not present

## 2023-01-27 DIAGNOSIS — X838XXA Intentional self-harm by other specified means, initial encounter: Secondary | ICD-10-CM | POA: Insufficient documentation

## 2023-01-27 DIAGNOSIS — J45909 Unspecified asthma, uncomplicated: Secondary | ICD-10-CM | POA: Diagnosis not present

## 2023-01-27 DIAGNOSIS — Z79899 Other long term (current) drug therapy: Secondary | ICD-10-CM | POA: Diagnosis not present

## 2023-01-27 DIAGNOSIS — F509 Eating disorder, unspecified: Secondary | ICD-10-CM | POA: Diagnosis not present

## 2023-01-27 DIAGNOSIS — F29 Unspecified psychosis not due to a substance or known physiological condition: Secondary | ICD-10-CM | POA: Diagnosis not present

## 2023-01-27 DIAGNOSIS — I10 Essential (primary) hypertension: Secondary | ICD-10-CM | POA: Diagnosis not present

## 2023-01-27 DIAGNOSIS — R55 Syncope and collapse: Secondary | ICD-10-CM | POA: Diagnosis not present

## 2023-01-27 DIAGNOSIS — T465X2A Poisoning by other antihypertensive drugs, intentional self-harm, initial encounter: Secondary | ICD-10-CM | POA: Insufficient documentation

## 2023-01-27 DIAGNOSIS — T1491XA Suicide attempt, initial encounter: Secondary | ICD-10-CM

## 2023-01-27 DIAGNOSIS — T50902A Poisoning by unspecified drugs, medicaments and biological substances, intentional self-harm, initial encounter: Secondary | ICD-10-CM | POA: Diagnosis present

## 2023-01-27 LAB — CBC
HCT: 37.5 % (ref 36.0–46.0)
Hemoglobin: 12.4 g/dL (ref 12.0–15.0)
MCH: 26.8 pg (ref 26.0–34.0)
MCHC: 33.1 g/dL (ref 30.0–36.0)
MCV: 81.2 fL (ref 80.0–100.0)
Platelets: 380 10*3/uL (ref 150–400)
RBC: 4.62 MIL/uL (ref 3.87–5.11)
RDW: 14.1 % (ref 11.5–15.5)
WBC: 10.3 10*3/uL (ref 4.0–10.5)
nRBC: 0 % (ref 0.0–0.2)

## 2023-01-27 LAB — COMPREHENSIVE METABOLIC PANEL
ALT: 48 U/L — ABNORMAL HIGH (ref 0–44)
AST: 27 U/L (ref 15–41)
Albumin: 3.5 g/dL (ref 3.5–5.0)
Alkaline Phosphatase: 96 U/L (ref 38–126)
Anion gap: 8 (ref 5–15)
BUN: 22 mg/dL — ABNORMAL HIGH (ref 6–20)
CO2: 20 mmol/L — ABNORMAL LOW (ref 22–32)
Calcium: 8.2 mg/dL — ABNORMAL LOW (ref 8.9–10.3)
Chloride: 107 mmol/L (ref 98–111)
Creatinine, Ser: 0.7 mg/dL (ref 0.44–1.00)
GFR, Estimated: >60 mL/min (ref >60)
Glucose, Bld: 98 mg/dL (ref 70–99)
Potassium: 3.2 mmol/L — ABNORMAL LOW (ref 3.5–5.1)
Sodium: 135 mmol/L (ref 135–145)
Total Bilirubin: 0.4 mg/dL (ref 0.3–1.2)
Total Protein: 6.9 g/dL (ref 6.5–8.1)

## 2023-01-27 LAB — ETHANOL: Alcohol, Ethyl (B): <10 mg/dL (ref <10)

## 2023-01-27 LAB — RAPID URINE DRUG SCREEN, HOSP PERFORMED
Amphetamines: NOT DETECTED
Barbiturates: POSITIVE — AB
Benzodiazepines: POSITIVE — AB
Cocaine: NOT DETECTED
Opiates: NOT DETECTED
Tetrahydrocannabinol: POSITIVE — AB

## 2023-01-27 LAB — MAGNESIUM: Magnesium: 2.1 mg/dL (ref 1.7–2.4)

## 2023-01-27 LAB — SALICYLATE LEVEL: Salicylate Lvl: <7 mg/dL — ABNORMAL LOW (ref 7.0–30.0)

## 2023-01-27 LAB — ACETAMINOPHEN LEVEL: Acetaminophen (Tylenol), Serum: 10 ug/mL (ref 10–30)

## 2023-01-27 LAB — HCG, QUANTITATIVE, PREGNANCY: hCG, Beta Chain, Quant, S: <1 m[IU]/mL (ref <5)

## 2023-01-27 LAB — CBG MONITORING, ED: Glucose-Capillary: 95 mg/dL (ref 70–99)

## 2023-01-27 MED ORDER — LACTATED RINGERS IV BOLUS
1000.0000 mL | Freq: Once | INTRAVENOUS | Status: AC
  Administered 2023-01-27: 1000 mL via INTRAVENOUS

## 2023-01-27 MED ORDER — CALCIUM GLUCONATE-NACL 1-0.675 GM/50ML-% IV SOLN
1.0000 g | Freq: Once | INTRAVENOUS | Status: AC
  Administered 2023-01-27: 1000 mg via INTRAVENOUS
  Filled 2023-01-27: qty 50

## 2023-01-27 MED ORDER — POTASSIUM CHLORIDE CRYS ER 20 MEQ PO TBCR
40.0000 meq | EXTENDED_RELEASE_TABLET | Freq: Once | ORAL | Status: AC
  Administered 2023-01-27: 40 meq via ORAL
  Filled 2023-01-27: qty 2

## 2023-01-27 MED ORDER — GLUCAGON HCL RDNA (DIAGNOSTIC) 1 MG IJ SOLR
1.0000 mg | Freq: Once | INTRAMUSCULAR | Status: AC
  Administered 2023-01-27: 1 mg via INTRAVENOUS
  Filled 2023-01-27: qty 1

## 2023-01-27 NOTE — ED Notes (Signed)
Pt returned from CT °

## 2023-01-27 NOTE — ED Notes (Signed)
Allowed pt to use her personal cell phone to call her ex-fiance to bring her CPAP machine to the ED. Granted 5 minutes worth of phone time.

## 2023-01-27 NOTE — ED Notes (Signed)
Pt is wanting to leave. Pt informed that she has not been medically cleared by a doctor and needs to talk to a counselor before she is able to leave. Pt informed that she will be with [place] throughout the night for sure. Pt asked for a soda. Soda given to pt.  This RN asked pt if pt was trying to commit suicide and pt answered yes. Asked pt how long they felt that way and pt stated "I wrote my first suicide note at 10"

## 2023-01-27 NOTE — ED Notes (Signed)
PC from poison control. RN provided update on patient's status - stable and in NAD. Poison control asked that an EKG be completed on pt. Reported one was completed at 15:00. Poison control asked what the plan of care was, explained we were still awaiting an evaluation from [facility] to determine next steps.

## 2023-01-27 NOTE — ED Notes (Signed)
Spoke with poison control, state they will continue to follow the pt throughout the night.

## 2023-01-27 NOTE — ED Notes (Signed)
Family brought patient's CPAP from home. Currently held at the nurses' station.

## 2023-01-27 NOTE — ED Notes (Signed)
Pt ambulated to the bathroom in NAD. Provided maroon scrubs to put on.

## 2023-01-27 NOTE — ED Notes (Signed)
EDP notified of hypotension at this time.

## 2023-01-27 NOTE — BH Assessment (Signed)
[facility] will complete TTS assessment. [facility] coordinator [phone]. Provider and time of assessment pending. Secure chat initiated.

## 2023-01-27 NOTE — ED Notes (Signed)
Pt assisted onto bedpan, voided approximately 300 cc of clear, amber urine.

## 2023-01-27 NOTE — ED Notes (Signed)
TTS monitor moved into patient's room for interview.

## 2023-01-27 NOTE — ED Notes (Signed)
IVC paperwork faxed to [phone] [facility].

## 2023-01-27 NOTE — ED Triage Notes (Signed)
Pt arrived [facility] for intentional overdose. Pt took 40 pills of carvedilol 6.25mg  ; xanax 20-3pills of 0.5mg ; amlodipine 30-4 pills of 5mg  at 10 pm last night. Pt stated she began to vomit around midnight x 4 times. Pt texted her finance  and told him what to do with the kids after her death. Finance then called EMS but pt refused care. Pt states this morning she called the crisis hotline because she didn't want to die a painful death. Pt had taken her bp at home and it was 74/75. Crisis line told pt to call [facility]. @0  G IV in right AC. 200 NS  given on the truck.

## 2023-01-27 NOTE — ED Provider Notes (Signed)
Cynthia Townsend EMERGENCY DEPARTMENT AT [facility], [place] Provider Note   CSN: [id number] Arrival date & time: 01/27/23  1452     History  Chief Complaint  Patient presents with   Drug Overdose    Cynthia Townsend is a 42 y.o. female.   Drug Overdose Associated symptoms include headaches.  Patient presents for suicide attempt.  Medical history includes anemia, HTN, anxiety, asthma, ADHD, depression.  She was seen in the ED 3 weeks ago for near syncope.  Last night, at 10 PM, she took the following: 40 tablets of 6.25 mg carvedilol, 20-30 tablets of 0.5 mg Xanax, 30-40 tablets at 5 mg amlodipine.  EMS was called to her home last night.  Patient refused transport at the time.  She did vomit midnight.  She subsequently went to sleep.  When she woke up this morning, she called the crisis hotline because she states that she did not want to die a slow painful death.  Crisis hotline then called 911.  Patient received 200 cc IVF prior to arrival.  Currently, she endorses fatigue and 6/10 severity headache.  She denies any prior suicide attempts.  She does state that she has had recurrent intrusive thoughts of suicide with plan in the past.  She denies any alcohol or illicit drug ingestion last night or today.  She denies any ingestions of medications today.     Home Medications Prior to Admission medications   Medication Sig Start Date End Date Taking? Authorizing Provider  albuterol (VENTOLIN HFA) 108 (90 Base) MCG/ACT inhaler Inhale 2 puffs into the lungs every 6 (six) hours as needed for wheezing 08/22/21  Yes   ALPRAZolam (XANAX) 0.5 MG tablet Take 1 tablet (0.5 mg total) by mouth 3 (three) times daily only if needed 09/19/21  Yes   amLODipine (NORVASC) 5 MG tablet Take 1 tablet (5 mg total) by mouth daily. 11/26/22  Yes Conte, Tessa N, PA-C  baclofen (LIORESAL) 10 MG tablet Take 1 tablet (10 mg total) by mouth 2 (two) times daily as needed. 04/11/22  Yes   carvedilol (COREG) 6.25 MG tablet Take  1 tablet (6.25 mg total) by mouth 2 (two) times daily. 10/15/22  Yes Meriam Sprague, MD  cetirizine (ZYRTEC) 10 MG tablet Take 10 mg by mouth at bedtime as needed for allergies.   Yes [provider]  Cholecalciferol 125 MCG (5000 UT) capsule    Yes [provider]  dapagliflozin propanediol (FARXIGA) 10 MG TABS tablet Take 1 tablet (10 mg total) by mouth daily before breakfast. 10/11/22  Yes Pemberton, Kathlynn Grate, MD  EPINEPHrine 0.3 mg/0.3 mL IJ SOAJ injection Use device as directed for severe allergic reaction. 09/12/21  Yes   furosemide (LASIX) 40 MG tablet Take 1.5 tablets (60 mg total) by mouth daily. Patient taking differently: Take 40 mg by mouth as needed for fluid or edema. 11/26/22  Yes Sharlene Dory, PA-C  levonorgestrel (MIRENA) 20 MCG/DAY IUD by Intrauterine route. 12/04/20  Yes [provider]  levothyroxine (SYNTHROID) 200 MCG tablet Take 200 mcg by mouth every morning. 12/16/22  Yes [provider]  lisinopril (ZESTRIL) 2.5 MG tablet Take 2.5 mg by mouth daily. 01/06/23 01/06/24 Yes [provider]  mirtazapine (REMERON) 30 MG tablet Take 60 mg by mouth at bedtime.   Yes [provider]  omeprazole (PRILOSEC) 40 MG capsule Take 40 mg by mouth daily. 12/07/22  Yes [provider]  OZEMPIC, 0.25 OR 0.5 MG/DOSE, 2 MG/3ML SOPN Inject 0.5 mg  into the skin once a week. 01/15/23  Yes [provider]  potassium chloride SA (KLOR-CON M) 20 MEQ tablet Take 1.5 tablets (30 mEq total) by mouth daily. Patient taking differently: Take 20 mEq by mouth as needed (with Lasix). 11/26/22  Yes Asa Lente, Tessa N, PA-C  spironolactone (ALDACTONE) 25 MG tablet Take 1 tablet (25 mg total) by mouth daily. 10/11/22  Yes Meriam Sprague, MD  SYMBICORT 160-4.5 MCG/ACT inhaler Inhale 2 puffs into the lungs daily as needed (wheezing, shortness of breath). 10/09/22  Yes [provider]  traZODone (DESYREL) 100 MG tablet Take 1 tablet (100 mg  total) by mouth at bedtime. Patient taking differently: Take 200 mg by mouth at bedtime. 08/09/21  Yes       Allergies    Bee venom, Penicillins, and Sulfa antibiotics    Review of Systems   Review of Systems  Constitutional:  Positive for fatigue.  Neurological:  Positive for headaches.  Psychiatric/Behavioral:  Positive for dysphoric mood and self-injury.   All other systems reviewed and are negative.   Physical Exam Updated Vital Signs BP 122/66   Pulse 93   Temp 98.6 F (37 C) (Oral)   Resp (!) 25   Ht 5\' 2"  (1.575 m)   Wt 117.9 kg   SpO2 92%   BMI 47.55 kg/m  Physical Exam Vitals and nursing note reviewed.  Constitutional:      General: She is not in acute distress.    Appearance: Normal appearance. She is well-developed. She is not ill-appearing, toxic-appearing or diaphoretic.  HENT:     Head: Normocephalic and atraumatic.     Right Ear: External ear normal.     Left Ear: External ear normal.     Nose: Nose normal.     Mouth/Throat:     Mouth: Mucous membranes are moist.  Eyes:     Extraocular Movements: Extraocular movements intact.     Conjunctiva/sclera: Conjunctivae normal.     Pupils: Pupils are equal, round, and reactive to light.  Cardiovascular:     Rate and Rhythm: Normal rate and regular rhythm.     Heart sounds: No murmur heard. Pulmonary:     Effort: Pulmonary effort is normal. No respiratory distress.     Breath sounds: Normal breath sounds. No wheezing or rales.  Abdominal:     General: There is no distension.     Palpations: Abdomen is soft.     Tenderness: There is no abdominal tenderness.  Musculoskeletal:        General: No swelling. Normal range of motion.     Cervical back: Normal range of motion and neck supple.     Right lower leg: No edema.     Left lower leg: No edema.  Skin:    General: Skin is warm and dry.     Coloration: Skin is not jaundiced or pale.  Neurological:     General: No focal deficit present.     Mental  Status: She is alert and oriented to person, place, and time.     Cranial Nerves: No cranial nerve deficit.     Sensory: No sensory deficit.     Motor: No weakness.     Coordination: Coordination normal.  Psychiatric:        Mood and Affect: Mood normal.        Behavior: Behavior normal.        Thought Content: Thought content normal.        Judgment: Judgment normal.  ED Results / Procedures / Treatments   Labs (all labs ordered are listed, but only abnormal results are displayed) Labs Reviewed  COMPREHENSIVE METABOLIC PANEL - Abnormal; Notable for the following components:      Result Value   Potassium 3.2 (*)    CO2 20 (*)    BUN 22 (*)    Calcium 8.2 (*)    ALT 48 (*)    All other components within normal limits  SALICYLATE LEVEL - Abnormal; Notable for the following components:   Salicylate Lvl <7.0 (*)    All other components within normal limits  RAPID URINE DRUG SCREEN, HOSP PERFORMED - Abnormal; Notable for the following components:   Benzodiazepines POSITIVE (*)    Tetrahydrocannabinol POSITIVE (*)    Barbiturates POSITIVE (*)    All other components within normal limits  MAGNESIUM  ETHANOL  ACETAMINOPHEN LEVEL  CBC  HCG, QUANTITATIVE, PREGNANCY  CBG MONITORING, ED    EKG EKG Interpretation Date/Time:  Monday January 27 2023 15:04:26 EDT Ventricular Rate:  91 PR Interval:  161 QRS Duration:  91 QT Interval:  374 QTC Calculation: 461 R Axis:   53  Text Interpretation: Sinus rhythm Confirmed by Gloris Manchester [phone]) on 01/27/2023 3:19:40 PM  Radiology CT Cervical Spine Wo Contrast  Result Date: 01/27/2023 CLINICAL DATA:  Trauma, fall. EXAM: CT CERVICAL SPINE WITHOUT CONTRAST TECHNIQUE: Multidetector CT imaging of the cervical spine was performed without intravenous contrast. Multiplanar CT image reconstructions were also generated. RADIATION DOSE REDUCTION: This exam was performed according to the departmental dose-optimization program which includes  automated exposure control, adjustment of the mA and/or kV according to patient size and/or use of iterative reconstruction technique. COMPARISON:  None Available. FINDINGS: Alignment: Normal. Skull base and vertebrae: No acute fracture. No primary bone lesion or focal pathologic process. Soft tissues and spinal canal: No prevertebral fluid or swelling. No visible canal hematoma. Disc levels: There are mild degenerative endplate changes at C5-C6. No significant central canal or neural foraminal stenosis at any level. Upper chest: Negative. Other: None. IMPRESSION: No acute fracture or traumatic subluxation of the cervical spine. Electronically Signed   By: Darliss Cheney M.D.   On: 01/27/2023 18:44   CT Head Wo Contrast  Result Date: 01/27/2023 CLINICAL DATA:  Head trauma EXAM: CT HEAD WITHOUT CONTRAST TECHNIQUE: Contiguous axial images were obtained from the base of the skull through the vertex without intravenous contrast. RADIATION DOSE REDUCTION: This exam was performed according to the departmental dose-optimization program which includes automated exposure control, adjustment of the mA and/or kV according to patient size and/or use of iterative reconstruction technique. COMPARISON:  MRI head 10/19/2015 FINDINGS: Brain: No evidence of acute infarction, hemorrhage, hydrocephalus, extra-axial collection or mass lesion/mass effect. Vascular: No hyperdense vessel or unexpected calcification. Skull: Normal. Negative for fracture or focal lesion. Sinuses/Orbits: No acute finding. Other: None. IMPRESSION: No acute intracranial abnormality. Electronically Signed   By: Darliss Cheney M.D.   On: 01/27/2023 18:42   DG Chest Portable 1 View  Result Date: 01/27/2023 CLINICAL DATA:  Intentional overdose.  Fall. EXAM: PORTABLE CHEST 1 VIEW COMPARISON:  Chest two views 01/06/2023 FINDINGS: Single frontal view of the chest. Cardiac silhouette and mediastinal contours are within limits. The lungs appear clear. No pleural  effusion is seen. No pneumothorax. No acute skeletal abnormality. Surgical clips again overlie the neck base. IMPRESSION: No active disease. Electronically Signed   By: Neita Garnet M.D.   On: 01/27/2023 17:59    Procedures Procedures  Medications Ordered in ED Medications  lactated ringers bolus 1,000 mL (0 mLs Intravenous Stopped 01/27/23 1932)  potassium chloride SA (KLOR-CON M) CR tablet 40 mEq (40 mEq Oral Given 01/27/23 1654)  lactated ringers bolus 1,000 mL (0 mLs Intravenous Stopped 01/27/23 2015)  calcium gluconate 1 g/ 50 mL sodium chloride IVPB (0 mg Intravenous Stopped 01/27/23 1940)  glucagon (human recombinant) (GLUCAGEN) injection 1 mg (1 mg Intravenous Given 01/27/23 1837)    ED Course/ Medical Decision Making/ A&P                                 Medical Decision Making Amount and/or Complexity of Data Reviewed Labs: ordered. Radiology: ordered.  Risk Prescription drug management.   This patient presents to the ED for concern of intentional overdose, this involves an extensive number of treatment options, and is a complaint that carries with it a high risk of complications and morbidity.  The differential diagnosis includes toxic effects of medication, ongoing suicidality   Co morbidities that complicate the patient evaluation  anemia, HTN, anxiety, asthma, ADHD, depression   Additional history obtained:  Additional history obtained from N/A External records from outside source obtained and reviewed including EMR   Lab Tests:  I Ordered, and personally interpreted labs.  The pertinent results include: Hypokalemia is present.  Hemoglobin is normal.  No leukocytosis is present.  Kidney function is normal.  UDS is positive for benzodiazepines, THC, and barbiturates.   Imaging Studies ordered:  I ordered imaging studies including chest x-ray, CT of head and cervical spine I independently visualized and interpreted imaging which showed no acute findings I  agree with the radiologist interpretation   Cardiac Monitoring: / EKG:  The patient was maintained on a cardiac monitor.  I personally viewed and interpreted the cardiac monitored which showed an underlying rhythm of: Sinus rhythm   Consultations Obtained:  I requested consultation with the poison control,  and discussed lab and imaging findings as well as pertinent plan - they recommend: 12-hour observation from time of patient's arrival in the ED, cardiac monitoring throughout, IV fluids for hypotension, glucagon and calcium for refractory hypotension, Levophed beyond that.  Atropine for bradycardia.   Problem List / ED Course / Critical interventions / Medication management  Patient presents after suicide attempt by medication overdose.  This occurred last night.  Currently, she is 17 hours postingestion.  She is well-appearing on exam.  Vital signs are currently normal.  Breathing is unlabored.  Abdomen is soft and nontender.  EKG shows normal sinus rhythm with narrow QRS.  Laboratory workup was initiated.  Patient was given IV fluids.  Poison control gave recommendations as per above.  Patient will be medically cleared at 3 AM.  IVC paperwork was completed.  Patient gave further history and stated that she did fall last night and did strike her head.  CT imaging of head and cervical spine were ordered.  Results showed no acute findings.  While in the ED, she did develop hypotension.  She was given additional IV fluids in addition to glucagon and calcium gluconate.  Her urinalysis was positive for benzodiazepines which is consistent with her history.  It is also positive for THC and barbiturates which suggests possible coingestions.  Patient's lab work showed some hypokalemia.  Replacement potassium was ordered.  Blood pressures remained stable.  Care of patient was signed out to oncoming ED provider. I ordered medication including IV  fluids, glucagon, calcium gluconate for hypotension, potassium  chloride for hypokalemia Reevaluation of the patient after these medicines showed that the patient improved I have reviewed the patients home medicines and have made adjustments as needed   Social Determinants of Health:  Has PCP         Final Clinical Impression(s) / ED Diagnoses Final diagnoses:  Suicide attempt [facility])    Rx / DC Orders ED Discharge Orders     None         Gloris Manchester, MD 01/27/23 2336

## 2023-01-27 NOTE — ED Notes (Signed)
Pt's belongings placed in bag at bedside.

## 2023-01-27 NOTE — ED Notes (Signed)
Faxed IVC paperwork to Magistrate.  

## 2023-01-28 ENCOUNTER — Inpatient Hospital Stay (HOSPITAL_COMMUNITY)
Admission: AD | Admit: 2023-01-28 | Discharge: 2023-02-02 | DRG: 917 | Disposition: A | Discharge status: Home / Self Care | Payer: Medicaid Other | Source: Intra-hospital | Attending: Emergency Medicine | Admitting: Emergency Medicine

## 2023-01-28 ENCOUNTER — Encounter (HOSPITAL_COMMUNITY): Payer: Self-pay | Admitting: Psychiatry

## 2023-01-28 ENCOUNTER — Other Ambulatory Visit: Payer: Self-pay

## 2023-01-28 DIAGNOSIS — I5033 Acute on chronic diastolic (congestive) heart failure: Secondary | ICD-10-CM | POA: Insufficient documentation

## 2023-01-28 DIAGNOSIS — I110 Hypertensive heart disease with heart failure: Secondary | ICD-10-CM | POA: Diagnosis present

## 2023-01-28 DIAGNOSIS — E785 Hyperlipidemia, unspecified: Secondary | ICD-10-CM | POA: Diagnosis present

## 2023-01-28 DIAGNOSIS — Z8249 Family history of ischemic heart disease and other diseases of the circulatory system: Secondary | ICD-10-CM

## 2023-01-28 DIAGNOSIS — T424X2A Poisoning by benzodiazepines, intentional self-harm, initial encounter: Principal | ICD-10-CM | POA: Diagnosis present

## 2023-01-28 DIAGNOSIS — F5081 Binge eating disorder: Secondary | ICD-10-CM | POA: Diagnosis present

## 2023-01-28 DIAGNOSIS — M7989 Other specified soft tissue disorders: Secondary | ICD-10-CM | POA: Diagnosis not present

## 2023-01-28 DIAGNOSIS — R0602 Shortness of breath: Secondary | ICD-10-CM | POA: Diagnosis present

## 2023-01-28 DIAGNOSIS — Z880 Allergy status to penicillin: Secondary | ICD-10-CM

## 2023-01-28 DIAGNOSIS — Y929 Unspecified place or not applicable: Secondary | ICD-10-CM

## 2023-01-28 DIAGNOSIS — F341 Dysthymic disorder: Secondary | ICD-10-CM | POA: Diagnosis present

## 2023-01-28 DIAGNOSIS — F332 Major depressive disorder, recurrent severe without psychotic features: Secondary | ICD-10-CM

## 2023-01-28 DIAGNOSIS — J4530 Mild persistent asthma, uncomplicated: Secondary | ICD-10-CM | POA: Diagnosis not present

## 2023-01-28 DIAGNOSIS — F3162 Bipolar disorder, current episode mixed, moderate: Secondary | ICD-10-CM | POA: Insufficient documentation

## 2023-01-28 DIAGNOSIS — Z5941 Food insecurity: Secondary | ICD-10-CM

## 2023-01-28 DIAGNOSIS — G4700 Insomnia, unspecified: Secondary | ICD-10-CM | POA: Diagnosis present

## 2023-01-28 DIAGNOSIS — T502X5A Adverse effect of carbonic-anhydrase inhibitors, benzothiadiazides and other diuretics, initial encounter: Secondary | ICD-10-CM | POA: Diagnosis present

## 2023-01-28 DIAGNOSIS — F411 Generalized anxiety disorder: Secondary | ICD-10-CM | POA: Diagnosis present

## 2023-01-28 DIAGNOSIS — Z79890 Hormone replacement therapy (postmenopausal): Secondary | ICD-10-CM | POA: Diagnosis not present

## 2023-01-28 DIAGNOSIS — Z91030 Bee allergy status: Secondary | ICD-10-CM

## 2023-01-28 DIAGNOSIS — I5031 Acute diastolic (congestive) heart failure: Secondary | ICD-10-CM

## 2023-01-28 DIAGNOSIS — Z79899 Other long term (current) drug therapy: Secondary | ICD-10-CM | POA: Diagnosis not present

## 2023-01-28 DIAGNOSIS — E119 Type 2 diabetes mellitus without complications: Secondary | ICD-10-CM | POA: Diagnosis present

## 2023-01-28 DIAGNOSIS — G4733 Obstructive sleep apnea (adult) (pediatric): Secondary | ICD-10-CM | POA: Diagnosis present

## 2023-01-28 DIAGNOSIS — Z6841 Body Mass Index (BMI) 40.0 and over, adult: Secondary | ICD-10-CM | POA: Diagnosis not present

## 2023-01-28 DIAGNOSIS — I10 Essential (primary) hypertension: Secondary | ICD-10-CM | POA: Insufficient documentation

## 2023-01-28 DIAGNOSIS — Z83438 Family history of other disorder of lipoprotein metabolism and other lipidemia: Secondary | ICD-10-CM

## 2023-01-28 DIAGNOSIS — E039 Hypothyroidism, unspecified: Secondary | ICD-10-CM | POA: Diagnosis present

## 2023-01-28 DIAGNOSIS — Z833 Family history of diabetes mellitus: Secondary | ICD-10-CM

## 2023-01-28 DIAGNOSIS — F4310 Post-traumatic stress disorder, unspecified: Secondary | ICD-10-CM | POA: Diagnosis present

## 2023-01-28 DIAGNOSIS — T447X2A Poisoning by beta-adrenoreceptor antagonists, intentional self-harm, initial encounter: Secondary | ICD-10-CM | POA: Diagnosis present

## 2023-01-28 DIAGNOSIS — F988 Other specified behavioral and emotional disorders with onset usually occurring in childhood and adolescence: Secondary | ICD-10-CM | POA: Diagnosis present

## 2023-01-28 DIAGNOSIS — Z825 Family history of asthma and other chronic lower respiratory diseases: Secondary | ICD-10-CM

## 2023-01-28 DIAGNOSIS — E876 Hypokalemia: Secondary | ICD-10-CM | POA: Diagnosis present

## 2023-01-28 DIAGNOSIS — Z882 Allergy status to sulfonamides status: Secondary | ICD-10-CM

## 2023-01-28 DIAGNOSIS — F502 Bulimia nervosa: Secondary | ICD-10-CM | POA: Diagnosis present

## 2023-01-28 DIAGNOSIS — Z7985 Long-term (current) use of injectable non-insulin antidiabetic drugs: Secondary | ICD-10-CM

## 2023-01-28 DIAGNOSIS — F909 Attention-deficit hyperactivity disorder, unspecified type: Secondary | ICD-10-CM | POA: Diagnosis present

## 2023-01-28 DIAGNOSIS — Z818 Family history of other mental and behavioral disorders: Secondary | ICD-10-CM

## 2023-01-28 DIAGNOSIS — R45851 Suicidal ideations: Secondary | ICD-10-CM

## 2023-01-28 DIAGNOSIS — Z5986 Financial insecurity: Secondary | ICD-10-CM

## 2023-01-28 DIAGNOSIS — Z8261 Family history of arthritis: Secondary | ICD-10-CM

## 2023-01-28 DIAGNOSIS — G2581 Restless legs syndrome: Secondary | ICD-10-CM | POA: Diagnosis present

## 2023-01-28 DIAGNOSIS — Z87891 Personal history of nicotine dependence: Secondary | ICD-10-CM

## 2023-01-28 DIAGNOSIS — F319 Bipolar disorder, unspecified: Principal | ICD-10-CM | POA: Diagnosis present

## 2023-01-28 DIAGNOSIS — F632 Kleptomania: Secondary | ICD-10-CM | POA: Diagnosis present

## 2023-01-28 MED ORDER — ALBUTEROL SULFATE HFA 108 (90 BASE) MCG/ACT IN AERS
2.0000 | INHALATION_SPRAY | Freq: Four times a day (QID) | RESPIRATORY_TRACT | Status: DC | PRN
  Filled 2023-01-28: qty 6.7

## 2023-01-28 MED ORDER — PANTOPRAZOLE SODIUM 40 MG PO TBEC
40.0000 mg | DELAYED_RELEASE_TABLET | Freq: Every day | ORAL | Status: DC
  Administered 2023-01-28 – 2023-02-02 (×6): 40 mg via ORAL
  Filled 2023-01-28 (×10): qty 1

## 2023-01-28 MED ORDER — MAGNESIUM HYDROXIDE 400 MG/5ML PO SUSP: 30.0000 mL | Freq: Every day | ORAL | Status: DC | PRN

## 2023-01-28 MED ORDER — TRAZODONE HCL 100 MG PO TABS
100.0000 mg | ORAL_TABLET | Freq: Every evening | ORAL | Status: DC | PRN
  Administered 2023-01-28: 100 mg via ORAL
  Filled 2023-01-28: qty 1

## 2023-01-28 MED ORDER — ACETAMINOPHEN 325 MG PO TABS: 650.0000 mg | ORAL_TABLET | Freq: Four times a day (QID) | ORAL | Status: DC | PRN

## 2023-01-28 MED ORDER — HALOPERIDOL LACTATE 5 MG/ML IJ SOLN: 5.0000 mg | Freq: Three times a day (TID) | INTRAMUSCULAR | Status: DC | PRN

## 2023-01-28 MED ORDER — TRAZODONE HCL 50 MG PO TABS
200.0000 mg | ORAL_TABLET | Freq: Every evening | ORAL | Status: DC | PRN
  Filled 2023-01-28: qty 4

## 2023-01-28 MED ORDER — CLONAZEPAM 0.125 MG PO TBDP
0.2500 mg | ORAL_TABLET | Freq: Three times a day (TID) | ORAL | Status: DC | PRN
  Filled 2023-01-28: qty 2

## 2023-01-28 MED ORDER — ALUM & MAG HYDROXIDE-SIMETH 200-200-20 MG/5ML PO SUSP: 30.0000 mL | ORAL | Status: DC | PRN

## 2023-01-28 MED ORDER — DAPAGLIFLOZIN PROPANEDIOL 10 MG PO TABS
10.0000 mg | ORAL_TABLET | Freq: Every day | ORAL | Status: DC
  Administered 2023-01-29 – 2023-02-02 (×5): 10 mg via ORAL
  Filled 2023-01-28 (×8): qty 1

## 2023-01-28 MED ORDER — DIPHENHYDRAMINE HCL 25 MG PO CAPS: 50.0000 mg | ORAL_CAPSULE | Freq: Three times a day (TID) | ORAL | Status: DC | PRN

## 2023-01-28 MED ORDER — CLONAZEPAM 0.25 MG PO TBDP
0.2500 mg | ORAL_TABLET | Freq: Three times a day (TID) | ORAL | Status: DC | PRN
  Administered 2023-01-30 – 2023-02-02 (×6): 0.25 mg via ORAL
  Filled 2023-01-28 (×7): qty 1

## 2023-01-28 MED ORDER — SPIRONOLACTONE 25 MG PO TABS
25.0000 mg | ORAL_TABLET | Freq: Every day | ORAL | Status: DC
  Administered 2023-01-29 – 2023-02-02 (×5): 25 mg via ORAL
  Filled 2023-01-28 (×8): qty 1

## 2023-01-28 MED ORDER — MIRTAZAPINE 30 MG PO TABS
60.0000 mg | ORAL_TABLET | Freq: Every day | ORAL | Status: DC
  Filled 2023-01-28 (×2): qty 2

## 2023-01-28 MED ORDER — POTASSIUM CHLORIDE CRYS ER 20 MEQ PO TBCR: 20.0000 meq | EXTENDED_RELEASE_TABLET | ORAL | Status: DC | PRN

## 2023-01-28 MED ORDER — SEMAGLUTIDE(0.25 OR 0.5MG/DOS) 2 MG/3ML ~~LOC~~ SOPN: 0.5000 mg | PEN_INJECTOR | SUBCUTANEOUS | Status: DC

## 2023-01-28 MED ORDER — LORATADINE 10 MG PO TABS
10.0000 mg | ORAL_TABLET | Freq: Every day | ORAL | Status: DC
  Administered 2023-01-28 – 2023-02-02 (×6): 10 mg via ORAL
  Filled 2023-01-28 (×10): qty 1

## 2023-01-28 MED ORDER — LEVOTHYROXINE SODIUM 200 MCG PO TABS
200.0000 ug | ORAL_TABLET | Freq: Every day | ORAL | Status: DC
  Administered 2023-01-29 – 2023-02-01 (×4): 200 ug via ORAL
  Filled 2023-01-28 (×7): qty 1
  Filled 2023-01-28: qty 2

## 2023-01-28 MED ORDER — HALOPERIDOL 5 MG PO TABS: 5.0000 mg | ORAL_TABLET | Freq: Three times a day (TID) | ORAL | Status: DC | PRN

## 2023-01-28 MED ORDER — FUROSEMIDE 40 MG PO TABS
40.0000 mg | ORAL_TABLET | ORAL | Status: DC | PRN
  Administered 2023-01-28 – 2023-01-29 (×3): 40 mg via ORAL
  Filled 2023-01-28 (×4): qty 2

## 2023-01-28 MED ORDER — LISINOPRIL 2.5 MG PO TABS
2.5000 mg | ORAL_TABLET | Freq: Every day | ORAL | Status: DC
  Administered 2023-01-29 – 2023-02-02 (×5): 2.5 mg via ORAL
  Filled 2023-01-28 (×8): qty 1

## 2023-01-28 MED ORDER — DIPHENHYDRAMINE HCL 50 MG/ML IJ SOLN: 50.0000 mg | Freq: Three times a day (TID) | INTRAMUSCULAR | Status: DC | PRN

## 2023-01-28 NOTE — ED Notes (Signed)
Pt with TTS 

## 2023-01-28 NOTE — ED Provider Notes (Signed)
  Physical Exam  BP 112/67   Pulse 85   Temp 98.5 F (36.9 C) (Oral)   Resp (!) 22   Ht 5\' 2"  (1.575 m)   Wt 117.9 kg   SpO2 93%   BMI 47.55 kg/m   Physical Exam Vitals and nursing note reviewed.  Constitutional:      General: She is not in acute distress.    Appearance: She is well-developed.  HENT:     Head: Normocephalic and atraumatic.  Eyes:     Conjunctiva/sclera: Conjunctivae normal.  Cardiovascular:     Rate and Rhythm: Normal rate and regular rhythm.     Heart sounds: No murmur heard. Pulmonary:     Effort: Pulmonary effort is normal. No respiratory distress.  Musculoskeletal:        General: No swelling.     Cervical back: Neck supple.  Skin:    General: Skin is warm and dry.     Capillary Refill: Capillary refill takes less than 2 seconds.  Neurological:     Mental Status: She is alert.  Psychiatric:        Mood and Affect: Mood normal.     Procedures  Procedures  ED Course / MDM    Medical Decision Making Amount and/or Complexity of Data Reviewed Labs: ordered. Radiology: ordered.  Risk Prescription drug management.   Patient received in handoff.  Calcium channel blocker overdose that will be med cleared at 3 AM.  Patient did not have any persistent hypotension or bradycardia while here in the emergency department.  Seen by TTS who is recommending inpatient hospitalization.       Glendora Score, MD 01/28/23 [id number]

## 2023-01-28 NOTE — Progress Notes (Signed)
Pt admitted IVC to [facility] adult in-patient unit.  Pt  reports a long history of depression and anxiety.  A stressor is living with ex-fiance.  Father has dementia.  Her sister lives with her father.  Mother passed 2018.  Recent health issues since February:  DM2, congestive heart failure, thyoidectomy.  Pt was feeling overwhelmed and OD on medication including carvedilol, xanax and amlodipine.  Pt denied SI, AVH at the time of admission.  Pt oriented to unit.  Pt safe on unit.

## 2023-01-28 NOTE — ED Notes (Signed)
RT consulted regarding pt's cpap set up

## 2023-01-28 NOTE — Plan of Care (Signed)
  Problem: Education: Goal: Knowledge of [facility] Education information/materials will improve Outcome: Progressing Goal: Verbalization of understanding the information provided will improve Outcome: Progressing   Problem: Coping: Goal: Ability to verbalize frustrations and anger appropriately will improve Outcome: Progressing Goal: Ability to demonstrate self-control will improve Outcome: Progressing
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
## 2023-01-28 NOTE — ED Notes (Signed)
Poison control updated to pt's disposition. Per their recommendation, 2nd EKG taken.  EKG given to EDP

## 2023-01-28 NOTE — ED Notes (Signed)
Pt eating lunch prior to transport. All belongings out of locker given to officer.

## 2023-01-28 NOTE — ED Notes (Signed)
Pt requesting her cpap machine as pt feels like she cannot breath well. Together we decided to hold off on her cpap until after TTS, which should be any moment then pt can get on cpap for the rest of the night

## 2023-01-28 NOTE — ED Notes (Signed)
Pt awake, alert and oriented at this time. Sitting on side of bed eating breakfast. Calm and cooperative request to call kids. Ivs removed and pt disconnected from monitor.

## 2023-01-28 NOTE — ED Notes (Signed)
[facility] Paperwork faxed to [phone]

## 2023-01-28 NOTE — ED Notes (Signed)
RT at bedside to set up cpap machine for pt.   Pt's significant other, Arlys John, called and updated regarding pt's disposition. Pt was worried about childcare for her children. Arlys John informed this RN that he took tomorrow off work to keep the kids. Pt was informed of this which helped alleviate some anxiety regarding her children.

## 2023-01-28 NOTE — ED Notes (Signed)
Pt ambulated to the bathroom with sitter 

## 2023-01-28 NOTE — Progress Notes (Signed)
    01/28/23 2110  Psych Admission Type (Psych Patients Only)  Admission Status Involuntary  Psychosocial Assessment  Patient Complaints Anxiety;Depression;Sadness  Eye Contact Fair  Facial Expression Anxious  Affect Anxious;Depressed  Speech Logical/coherent  Interaction Assertive  Motor Activity Fidgety  Appearance/Hygiene In scrubs  Behavior Characteristics Cooperative;Anxious  Mood Pleasant;Depressed;Anxious  Thought Process  Coherency WDL  Content WDL  Delusions None reported or observed  Perception WDL  Hallucination None reported or observed  Judgment WDL  Confusion None  Danger to Self  Current suicidal ideation? Denies  Danger to Others  Danger to Others None reported or observed

## 2023-01-28 NOTE — Progress Notes (Signed)
Pt has been accepted to [facility] TODAY 01/28/2023. Bed assignment: 300-1  Pt meets inpatient criteria per Ophelia Shoulder, NP  Attending Physician will be Nadir Abbott Pao, MD  Report can be called to: - Adult unit: [phone]  Pt can arrive after pending discharges; [facility] to coordinate arrival time  Care Team Notified: [facility] Malva Limes, RN, Dalia Heading, RN, Roseanne Reno, RN, Lorraine Lax, NT, and Ophelia Shoulder, NP  Cathie Beams, LCSW  01/28/2023 10:14 AM

## 2023-01-28 NOTE — Plan of Care (Signed)
  Problem: Education: Goal: Knowledge of [facility] Education information/materials will improve Outcome: Progressing Goal: Verbalization of understanding the information provided will improve Outcome: Progressing   

## 2023-01-28 NOTE — Progress Notes (Signed)
   01/28/23 1800  Psychosocial Assessment  Patient Complaints Anxiety;Crying spells;Depression  Eye Contact Fair  Facial Expression Anxious  Affect Anxious;Depressed  Speech Logical/coherent  Interaction Assertive  Motor Activity Fidgety  Appearance/Hygiene In scrubs  Behavior Characteristics Cooperative;Anxious  Mood Depressed;Anxious  Thought Process  Coherency WDL  Content WDL  Delusions None reported or observed  Perception WDL  Hallucination None reported or observed  Judgment WDL  Confusion None  Danger to Self  Current suicidal ideation? Denies  Danger to Others  Danger to Others None reported or observed

## 2023-01-28 NOTE — ED Notes (Addendum)
No note

## 2023-01-29 ENCOUNTER — Encounter (HOSPITAL_COMMUNITY): Payer: Self-pay

## 2023-01-29 ENCOUNTER — Encounter (HOSPITAL_COMMUNITY): Payer: Self-pay | Admitting: Adult Health

## 2023-01-29 ENCOUNTER — Other Ambulatory Visit: Payer: Self-pay

## 2023-01-29 ENCOUNTER — Inpatient Hospital Stay (HOSPITAL_COMMUNITY): Payer: Medicaid Other

## 2023-01-29 DIAGNOSIS — I5033 Acute on chronic diastolic (congestive) heart failure: Secondary | ICD-10-CM | POA: Insufficient documentation

## 2023-01-29 DIAGNOSIS — I5031 Acute diastolic (congestive) heart failure: Secondary | ICD-10-CM

## 2023-01-29 DIAGNOSIS — E039 Hypothyroidism, unspecified: Secondary | ICD-10-CM

## 2023-01-29 DIAGNOSIS — F332 Major depressive disorder, recurrent severe without psychotic features: Secondary | ICD-10-CM

## 2023-01-29 DIAGNOSIS — E876 Hypokalemia: Secondary | ICD-10-CM

## 2023-01-29 DIAGNOSIS — J4530 Mild persistent asthma, uncomplicated: Secondary | ICD-10-CM

## 2023-01-29 LAB — COMPREHENSIVE METABOLIC PANEL
ALT: 41 U/L (ref 0–44)
AST: 30 U/L (ref 15–41)
Albumin: 3.8 g/dL (ref 3.5–5.0)
Alkaline Phosphatase: 100 U/L (ref 38–126)
Anion gap: 13 (ref 5–15)
BUN: 11 mg/dL (ref 6–20)
CO2: 20 mmol/L — ABNORMAL LOW (ref 22–32)
Calcium: 8.6 mg/dL — ABNORMAL LOW (ref 8.9–10.3)
Chloride: 102 mmol/L (ref 98–111)
Creatinine, Ser: 0.54 mg/dL (ref 0.44–1.00)
GFR, Estimated: >60 mL/min (ref >60)
Glucose, Bld: 103 mg/dL — ABNORMAL HIGH (ref 70–99)
Potassium: 3.1 mmol/L — ABNORMAL LOW (ref 3.5–5.1)
Sodium: 135 mmol/L (ref 135–145)
Total Bilirubin: 0.4 mg/dL (ref 0.3–1.2)
Total Protein: 7.6 g/dL (ref 6.5–8.1)

## 2023-01-29 LAB — CBC WITH DIFFERENTIAL/PLATELET
Abs Immature Granulocytes: 0.16 10*3/uL — ABNORMAL HIGH (ref 0.00–0.07)
Basophils Absolute: 0.1 10*3/uL (ref 0.0–0.1)
Basophils Relative: 1 %
Eosinophils Absolute: 0.3 10*3/uL (ref 0.0–0.5)
Eosinophils Relative: 3 %
HCT: 39.8 % (ref 36.0–46.0)
Hemoglobin: 13.2 g/dL (ref 12.0–15.0)
Immature Granulocytes: 1 %
Lymphocytes Relative: 21 %
Lymphs Abs: 2.3 10*3/uL (ref 0.7–4.0)
MCH: 26.8 pg (ref 26.0–34.0)
MCHC: 33.2 g/dL (ref 30.0–36.0)
MCV: 80.7 fL (ref 80.0–100.0)
Monocytes Absolute: 0.7 10*3/uL (ref 0.1–1.0)
Monocytes Relative: 6 %
Neutro Abs: 7.5 10*3/uL (ref 1.7–7.7)
Neutrophils Relative %: 68 %
Platelets: 360 10*3/uL (ref 150–400)
RBC: 4.93 MIL/uL (ref 3.87–5.11)
RDW: 14.1 % (ref 11.5–15.5)
WBC: 11.1 10*3/uL — ABNORMAL HIGH (ref 4.0–10.5)
nRBC: 0.2 % (ref 0.0–0.2)

## 2023-01-29 LAB — TROPONIN I (HIGH SENSITIVITY)
Troponin I (High Sensitivity): 4 ng/L (ref <18)
Troponin I (High Sensitivity): 4 ng/L (ref <18)

## 2023-01-29 LAB — MAGNESIUM: Magnesium: 1.8 mg/dL (ref 1.7–2.4)

## 2023-01-29 LAB — BRAIN NATRIURETIC PEPTIDE: B Natriuretic Peptide: 56.6 pg/mL (ref 0.0–100.0)

## 2023-01-29 MED ORDER — FUROSEMIDE 10 MG/ML IJ SOLN
40.0000 mg | Freq: Once | INTRAMUSCULAR | Status: AC
  Administered 2023-01-29: 40 mg via INTRAVENOUS
  Filled 2023-01-29: qty 4

## 2023-01-29 MED ORDER — FUROSEMIDE 10 MG/ML IJ SOLN: 40.0000 mg | Freq: Two times a day (BID) | INTRAMUSCULAR | Status: DC

## 2023-01-29 MED ORDER — MIRTAZAPINE 30 MG PO TABS
30.0000 mg | ORAL_TABLET | Freq: Every day | ORAL | Status: DC
  Administered 2023-01-29 – 2023-01-30 (×2): 30 mg via ORAL
  Filled 2023-01-29 (×4): qty 1

## 2023-01-29 MED ORDER — FUROSEMIDE 10 MG/ML IJ SOLN
20.0000 mg | Freq: Once | INTRAMUSCULAR | Status: AC
  Administered 2023-01-29: 20 mg via INTRAVENOUS
  Filled 2023-01-29: qty 4

## 2023-01-29 MED ORDER — POTASSIUM CHLORIDE CRYS ER 20 MEQ PO TBCR
40.0000 meq | EXTENDED_RELEASE_TABLET | Freq: Once | ORAL | Status: AC
  Administered 2023-01-29: 40 meq via ORAL
  Filled 2023-01-29: qty 2

## 2023-01-29 MED ORDER — TRAZODONE HCL 100 MG PO TABS
100.0000 mg | ORAL_TABLET | Freq: Every day | ORAL | Status: DC
  Administered 2023-01-29: 100 mg via ORAL
  Filled 2023-01-29 (×5): qty 1

## 2023-01-29 MED ORDER — SERTRALINE HCL 50 MG PO TABS
50.0000 mg | ORAL_TABLET | Freq: Every day | ORAL | Status: DC
  Administered 2023-01-29 – 2023-01-31 (×3): 50 mg via ORAL
  Filled 2023-01-29 (×5): qty 1

## 2023-01-29 MED ORDER — FUROSEMIDE 10 MG/ML IJ SOLN: 40.0000 mg | Freq: Once | INTRAMUSCULAR | Status: DC

## 2023-01-29 MED ORDER — ARIPIPRAZOLE 2 MG PO TABS
2.0000 mg | ORAL_TABLET | Freq: Every day | ORAL | Status: DC
  Administered 2023-01-29 – 2023-01-31 (×3): 2 mg via ORAL
  Filled 2023-01-29 (×5): qty 1

## 2023-01-29 NOTE — Progress Notes (Signed)
   01/29/23 0529  15 Minute Checks  Location Bedroom  Visual Appearance Calm  Behavior Sleeping  Sleep (Behavioral Health Patients Only)  Calculate sleep? (Click Yes once per 24 hr at 0600 safety check) Yes  Documented sleep last 24 hours 7.75

## 2023-01-29 NOTE — Group Note (Signed)
Recreation Therapy Group Note   Group Topic:Problem Solving  Group Date: 01/29/2023 Start Time: 0935 End Time: 1005 Facilitators: Tekoa Hamor-McCall, LRT,CTRS Location: [address] Dayroom   Goal Area(s) Addresses:  Patient will effectively work with peer towards shared goal.  Patient will identify skills used to make activity successful.  Patient will share challenges and verbalize solution-driven approaches used. Patient will identify how skills used during activity can be used to reach post d/c goals.   Group Description: [employer]. Patients were provided the following materials: 4 drinking straws, 5 rubber bands, 5 paper clips, 2 index cards, 2 drinking cups, and 2 toilet paper rolls. Using the provided materials patients were asked to build a launching mechanism to launch a ping pong ball across the room, approximately 10 feet. Patients were divided into teams of 3-5. Instructions required all materials be incorporated into the device, functionality of items left to the peer group's discretion.   Affect/Mood: N/A   Participation Level: Did not attend    Clinical Observations/Individualized Feedback:     Plan: Continue to engage patient in RT group sessions 2-3x/week.   Caroll Rancher, LRT,CTRS 01/29/2023 12:21 PM

## 2023-01-29 NOTE — Progress Notes (Addendum)
Patient report given to [facility] ED charge nurse regarding transfer of patient for treatment of symptoms related to possible exacerbation of CHF.

## 2023-01-29 NOTE — Progress Notes (Signed)
Cynthia Townsend is a 42 yo female with a reported PPHx of depression, anxiety, ADHD, eating disorders (bulimia, anorexia, binging)and a prior diagnosis of bipolar disorder. She has 1 prior psychiatric hospitalization and reports a history of chronic depression and suicidal ideations. She arrived to [facility] on 01/27/2023 for SI with attempted overdose of prescribed medications in the setting of worsening depression and multiple chronic health conditions. She was admitted to [facility] on 01/28/2023 under IVC. PMHx is significant for HTN, asthma, hypothyroidism s/p thyroidectomy (Feb 2024), CHF, and OSA on CPAP.   Subjective: Received report from RN that patient has been reporting worsening SOB and BLE swelling.  Spoke with patient, who confirms this. Denies CP, dizziness, or lightheadedness. Has worsened SOB when ambulating on unit that was not present earlier today. Believes her home medications, which were restarted are not working  Hx of CHF (LVEF 60-65% on 11/18/2022) on lasix 40 mg BID and spironolactone 25 mg QD  Objective Vitals:   01/29/23 0640 01/29/23 1631  BP: (!) 111/55 113/65  Pulse: 87 85  Resp:  (!) 22  Temp:  98.2 F (36.8 C)  SpO2:  99%   Physical Exam Constitutional:      General: She is not in acute distress. HENT:     Head: Normocephalic and atraumatic.  Pulmonary:     Effort: No respiratory distress.     Breath sounds: Examination of the right-lower field reveals decreased breath sounds. Examination of the left-lower field reveals decreased breath sounds. Decreased breath sounds present.  Skin:    Comments: +3 Pitting edema at BLE  Neurological:     General: No focal deficit present.    Assessment/Plan: Likely acute on chronic CHF exacerbation. Consult to [facility] placed at ~4:30 PM, spoke with Dr. Idelle Leech, who recommended patient be transferred to [facility] for further work up.  Contacted [facility] ED provider, Dr. Durwin Nora, who has agreed to accept patient. Patient is  currently under IVC and can return to [facility] once medically cleared.    Dr. Liston Alba, MD PGY-2, Psychiatry Residency
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
## 2023-01-29 NOTE — ED Triage Notes (Signed)
BIB EMS from [facility] for ankle swelling and exertional dyspnea. Pt has CHF and takes lasix and spironolactone and these have not been helping. Pitting edema noted to bilateral ankles. Denies SOB at rest

## 2023-01-29 NOTE — H&P (Signed)
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 in scheduled group therapies   -- Short Term Goals: Ability to identify changes in lifestyle to reduce recurrence of condition will improve and Ability to verbalize feelings will improve  -- Long Term Goals: Improvement in symptoms so as ready for discharge Other PRNS: Tylenol, albuterol, Maalox/Mylanta, Lasix, milk of magnesia, trazodone,   3. Medical Issues Being Addressed:  # Hypokalemia Repeat BMP for 01/30/2023, replete if low  4. Discharge Planning:   -- Social work and case management to assist with discharge planning and identification of hospital follow-up needs prior to discharge  -- Estimated LOS: 5-7 days  -- Discharge Concerns: Need to establish a safety plan; Medication compliance and effectiveness  -- Discharge Goals: Return home with outpatient referrals for mental health follow-up including  medication management/psychotherapy   I certify that inpatient services furnished can reasonably be expected to improve the patient's condition.   This note was created using a voice recognition software as a result there may be grammatical errors inadvertently enclosed that do not reflect the nature of this encounter. Every attempt is made to correct such errors.   Signed: Dr. Liston Alba, MD PGY-2, Psychiatry Residency  8/21/20242:53 PM

## 2023-01-29 NOTE — BH IP Treatment Plan (Signed)
Interdisciplinary Treatment and Diagnostic Plan Update  01/29/2023 Time of Session: 10:15am Cynthia Townsend MRN: [id number]  Principal Diagnosis: Bipolar affective disorder [facility])  Secondary Diagnoses: Principal Problem:   Bipolar affective disorder (HCC)   Current Medications:  Current Facility-Administered Medications  Medication Dose Route Frequency Provider Last Rate Last Admin   acetaminophen (TYLENOL) tablet 650 mg  650 mg Oral Q6H PRN Abbott Pao, Nadir, MD       albuterol (VENTOLIN HFA) 108 (90 Base) MCG/ACT inhaler 2 puff  2 puff Inhalation Q6H PRN Abbott Pao, Nadir, MD       alum & mag hydroxide-simeth (MAALOX/MYLANTA) 200-200-20 MG/5ML suspension 30 mL  30 mL Oral Q4H PRN Abbott Pao, Nadir, MD       clonazePAM (KLONOPIN) disintegrating tablet 0.25 mg  0.25 mg Oral TID PRN Sarita Bottom, MD       dapagliflozin propanediol (FARXIGA) tablet 10 mg  10 mg Oral QAC breakfast Abbott Pao, Nadir, MD   10 mg at 01/29/23 8469   diphenhydrAMINE (BENADRYL) capsule 50 mg  50 mg Oral TID PRN Sarita Bottom, MD       Or   diphenhydrAMINE (BENADRYL) injection 50 mg  50 mg Intramuscular TID PRN Abbott Pao, Nadir, MD       furosemide (LASIX) tablet 40 mg  40 mg Oral PRN Abbott Pao, Nadir, MD   40 mg at 01/29/23 0716   haloperidol (HALDOL) tablet 5 mg  5 mg Oral TID PRN Sarita Bottom, MD       Or   haloperidol lactate (HALDOL) injection 5 mg  5 mg Intramuscular TID PRN Sarita Bottom, MD       levothyroxine (SYNTHROID) tablet 200 mcg  200 mcg Oral Q0600 Abbott Pao, Nadir, MD   200 mcg at 01/29/23 0624   lisinopril (ZESTRIL) tablet 2.5 mg  2.5 mg Oral Daily Abbott Pao, Nadir, MD   2.5 mg at 01/29/23 0938   loratadine (CLARITIN) tablet 10 mg  10 mg Oral Daily Abbott Pao, Nadir, MD   10 mg at 01/29/23 6295   magnesium hydroxide (MILK OF MAGNESIA) suspension 30 mL  30 mL Oral Daily PRN Abbott Pao, Nadir, MD       mirtazapine (REMERON) tablet 60 mg  60 mg Oral QHS Attiah, Nadir, MD       pantoprazole (PROTONIX) EC tablet 40 mg  40 mg Oral  Daily Attiah, Nadir, MD   40 mg at 01/29/23 2841   potassium chloride SA (KLOR-CON M) CR tablet 20 mEq  20 mEq Oral PRN Sarita Bottom, MD       Semaglutide(0.25 or 0.5MG /DOS) SOPN 0.5 mg  0.5 mg Subcutaneous Weekly Attiah, Nadir, MD       spironolactone (ALDACTONE) tablet 25 mg  25 mg Oral Daily Abbott Pao, Nadir, MD   25 mg at 01/29/23 3244   traZODone (DESYREL) tablet 100 mg  100 mg Oral QHS PRN Sarita Bottom, MD   100 mg at 01/28/23 2110   PTA Medications: Medications Prior to Admission  Medication Sig Dispense Refill Last Dose   albuterol (VENTOLIN HFA) 108 (90 Base) MCG/ACT inhaler Inhale 2 puffs into the lungs every 6 (six) hours as needed for wheezing 6.7 g 1    ALPRAZolam (XANAX) 0.5 MG tablet Take 1 tablet (0.5 mg total) by mouth 3 (three) times daily only if needed 90 tablet 0    amLODipine (NORVASC) 5 MG tablet Take 1 tablet (5 mg total) by mouth daily. 90 tablet 3    baclofen (LIORESAL) 10 MG tablet Take 1 tablet (10 mg total) by mouth  2 (two) times daily as needed. 180 tablet 1    carvedilol (COREG) 6.25 MG tablet Take 1 tablet (6.25 mg total) by mouth 2 (two) times daily. 180 tablet 3    cetirizine (ZYRTEC) 10 MG tablet Take 10 mg by mouth at bedtime as needed for allergies.      Cholecalciferol 125 MCG (5000 UT) capsule       dapagliflozin propanediol (FARXIGA) 10 MG TABS tablet Take 1 tablet (10 mg total) by mouth daily before breakfast. 30 tablet 6    EPINEPHrine 0.3 mg/0.3 mL IJ SOAJ injection Use device as directed for severe allergic reaction. 2 each 0    furosemide (LASIX) 40 MG tablet Take 1.5 tablets (60 mg total) by mouth daily. (Patient taking differently: Take 40 mg by mouth as needed for fluid or edema.) 135 tablet 3    levonorgestrel (MIRENA) 20 MCG/DAY IUD by Intrauterine route.      levothyroxine (SYNTHROID) 200 MCG tablet Take 200 mcg by mouth every morning.      lisinopril (ZESTRIL) 2.5 MG tablet Take 2.5 mg by mouth daily.      mirtazapine (REMERON) 30 MG tablet  Take 60 mg by mouth at bedtime.      omeprazole (PRILOSEC) 40 MG capsule Take 40 mg by mouth daily.      OZEMPIC, 0.25 OR 0.5 MG/DOSE, 2 MG/3ML SOPN Inject 0.5 mg into the skin once a week.      potassium chloride SA (KLOR-CON M) 20 MEQ tablet Take 1.5 tablets (30 mEq total) by mouth daily. (Patient taking differently: Take 20 mEq by mouth as needed (with Lasix).) 135 tablet 3    spironolactone (ALDACTONE) 25 MG tablet Take 1 tablet (25 mg total) by mouth daily. 90 tablet 3    SYMBICORT 160-4.5 MCG/ACT inhaler Inhale 2 puffs into the lungs daily as needed (wheezing, shortness of breath).      traZODone (DESYREL) 100 MG tablet Take 1 tablet (100 mg total) by mouth at bedtime. (Patient taking differently: Take 200 mg by mouth at bedtime.) 30 tablet 0     Patient Stressors:    Patient Strengths:    Treatment Modalities: Medication Management, Group therapy, Case management,  1 to 1 session with clinician, Psychoeducation, Recreational therapy.   Physician Treatment Plan for Primary Diagnosis: Bipolar affective disorder (HCC) Long Term Goal(s):     Short Term Goals:    Medication Management: Evaluate patient's response, side effects, and tolerance of medication regimen.  Therapeutic Interventions: 1 to 1 sessions, Unit Group sessions and Medication administration.  Evaluation of Outcomes: Progressing  Physician Treatment Plan for Secondary Diagnosis: Principal Problem:   Bipolar affective disorder (HCC)  Long Term Goal(s):     Short Term Goals:       Medication Management: Evaluate patient's response, side effects, and tolerance of medication regimen.  Therapeutic Interventions: 1 to 1 sessions, Unit Group sessions and Medication administration.  Evaluation of Outcomes: Progressing   RN Treatment Plan for Primary Diagnosis: Bipolar affective disorder (HCC) Long Term Goal(s): Knowledge of disease and therapeutic regimen to maintain health will improve  Short Term Goals:  Ability to remain free from injury will improve, Ability to verbalize frustration and anger appropriately will improve, Ability to participate in decision making will improve, Ability to verbalize feelings will improve, Ability to identify and develop effective coping behaviors will improve, and Compliance with prescribed medications will improve  Medication Management: RN will administer medications as ordered by provider, will assess and evaluate patient's response and  provide education to patient for prescribed medication. RN will report any adverse and/or side effects to prescribing provider.  Therapeutic Interventions: 1 on 1 counseling sessions, Psychoeducation, Medication administration, Evaluate responses to treatment, Monitor vital signs and CBGs as ordered, Perform/monitor CIWA, COWS, AIMS and Fall Risk screenings as ordered, Perform wound care treatments as ordered.  Evaluation of Outcomes: Progressing   LCSW Treatment Plan for Primary Diagnosis: Bipolar affective disorder (HCC) Long Term Goal(s): Safe transition to appropriate next level of care at discharge, Engage patient in therapeutic group addressing interpersonal concerns.  Short Term Goals: Engage patient in aftercare planning with referrals and resources, Increase social support, Increase emotional regulation, Facilitate acceptance of mental health diagnosis and concerns, Identify triggers associated with mental health/substance abuse issues, and Increase skills for wellness and recovery  Therapeutic Interventions: Assess for all discharge needs, 1 to 1 time with Social worker, Explore available resources and support systems, Assess for adequacy in community support network, Educate family and significant other(s) on suicide prevention, Complete Psychosocial Assessment, Interpersonal group therapy.  Evaluation of Outcomes: Progressing   Progress in Treatment: Attending groups: No. Participating in groups: No. Taking  medication as prescribed: Yes. Toleration medication: Yes. Family/Significant other contact made: No, will contact:  Marcelina Morel Fiance [phone] Patient understands diagnosis: Yes. Discussing patient identified problems/goals with staff: Yes. Medical problems stabilized or resolved: No. Denies suicidal/homicidal ideation: Yes. Issues/concerns per patient self-inventory: No.  New problem(s) identified: No, Describe:  none reported   New Short Term/Long Term Goal(s):medication stabilization, elimination of SI thoughts, development of comprehensive mental wellness plan.    Patient Goals:  "to adjust to my meds and feel better."  Discharge Plan or Barriers: Patient recently admitted. CSW will continue to follow and assess for appropriate referrals and possible discharge planning.    Reason for Continuation of Hospitalization: Anxiety Depression Medication stabilization Suicidal ideation  Estimated Length of Stay: 5-7 days  Last 3 [place] Suicide Severity Risk Score: Flowsheet Row [facility] (Current[facility] from 01/27/2023 in [facility] ED from 01/06/2023 in [facility] Emergency Department at [facility]  C-SSRS RISK CATEGORY High Risk High Risk No Risk       Last PHQ 2/9 Scores:    05/01/2015    9:22 AM  Depression screen PHQ 2/9  Decreased Interest 0  Down, Depressed, Hopeless 0  PHQ - 2 Score 0    Scribe for Treatment Team: Izell , Alexander Mt 01/29/2023 12:59 PM

## 2023-01-29 NOTE — ED Provider Notes (Addendum)
EMERGENCY DEPARTMENT AT [facility] Provider Note   CSN: [id number] Arrival date & time: 01/29/23  1744     History  Chief Complaint  Patient presents with   Leg Swelling    Cynthia Townsend is a 42 y.o. female hx of HTN, here presenting with leg swelling.  Patient is currently at behavioral health.  Patient states that today she has worsening shortness of breath and leg swelling.  She had about a 6 pound weight gain.  Patient has a history of diastolic heart failure.  Patient is currently on Lasix 40 mg twice daily as well as spironolactone.  The psychiatrist consulted the hospitalist and patient was sent here for diuresis and possible admission  The history is provided by the patient.       Home Medications Prior to Admission medications   Medication Sig Start Date End Date Taking? Authorizing Provider  albuterol (VENTOLIN HFA) 108 (90 Base) MCG/ACT inhaler Inhale 2 puffs into the lungs every 6 (six) hours as needed for wheezing 08/22/21     ALPRAZolam (XANAX) 0.5 MG tablet Take 1 tablet (0.5 mg total) by mouth 3 (three) times daily only if needed 09/19/21     amLODipine (NORVASC) 5 MG tablet Take 1 tablet (5 mg total) by mouth daily. 11/26/22   Sharlene Dory, PA-C  baclofen (LIORESAL) 10 MG tablet Take 1 tablet (10 mg total) by mouth 2 (two) times daily as needed. 04/11/22     carvedilol (COREG) 6.25 MG tablet Take 1 tablet (6.25 mg total) by mouth 2 (two) times daily. 10/15/22   Meriam Sprague, MD  cetirizine (ZYRTEC) 10 MG tablet Take 10 mg by mouth at bedtime as needed for allergies.    [provider]  Cholecalciferol 125 MCG (5000 UT) capsule     [provider]  dapagliflozin propanediol (FARXIGA) 10 MG TABS tablet Take 1 tablet (10 mg total) by mouth daily before breakfast. 10/11/22   Meriam Sprague, MD  EPINEPHrine 0.3 mg/0.3 mL IJ SOAJ injection Use device as directed for severe allergic reaction. 09/12/21     furosemide (LASIX)  40 MG tablet Take 1.5 tablets (60 mg total) by mouth daily. Patient taking differently: Take 40 mg by mouth as needed for fluid or edema. 11/26/22   Sharlene Dory, PA-C  levonorgestrel (MIRENA) 20 MCG/DAY IUD by Intrauterine route. 12/04/20   [provider]  levothyroxine (SYNTHROID) 200 MCG tablet Take 200 mcg by mouth every morning. 12/16/22   [provider]  lisinopril (ZESTRIL) 2.5 MG tablet Take 2.5 mg by mouth daily. 01/06/23 01/06/24  [provider]  mirtazapine (REMERON) 30 MG tablet Take 60 mg by mouth at bedtime.    [provider]  omeprazole (PRILOSEC) 40 MG capsule Take 40 mg by mouth daily. 12/07/22   [provider]  OZEMPIC, 0.25 OR 0.5 MG/DOSE, 2 MG/3ML SOPN Inject 0.5 mg into the skin once a week. 01/15/23   [provider]  potassium chloride SA (KLOR-CON M) 20 MEQ tablet Take 1.5 tablets (30 mEq total) by mouth daily. Patient taking differently: Take 20 mEq by mouth as needed (with Lasix). 11/26/22   Sharlene Dory, PA-C  spironolactone (ALDACTONE) 25 MG tablet Take 1 tablet (25 mg total) by mouth daily. 10/11/22   Meriam Sprague, MD  SYMBICORT 160-4.5 MCG/ACT inhaler Inhale 2 puffs into the lungs daily as needed (wheezing, shortness of breath). 10/09/22   [provider]  traZODone (DESYREL) 100 MG tablet  Take 1 tablet (100 mg total) by mouth at bedtime. Patient taking differently: Take 200 mg by mouth at bedtime. 08/09/21         Allergies    Bee venom, Penicillins, and Sulfa antibiotics    Review of Systems   Review of Systems  Respiratory:  Positive for shortness of breath.   Cardiovascular:  Positive for leg swelling.  All other systems reviewed and are negative.   Physical Exam Updated Vital Signs BP 122/61   Pulse 78   Temp 98.2 F (36.8 C) (Oral)   Resp 15   Ht 5\' 2"  (1.575 m)   Wt 120.3 kg   SpO2 100%   BMI 48.51 kg/m  Physical Exam Vitals and nursing note reviewed.  Constitutional:       Appearance: Normal appearance.  HENT:     Head: Normocephalic.     Nose: Nose normal.     Mouth/Throat:     Mouth: Mucous membranes are moist.  Eyes:     Extraocular Movements: Extraocular movements intact.     Pupils: Pupils are equal, round, and reactive to light.  Cardiovascular:     Rate and Rhythm: Normal rate and regular rhythm.     Pulses: Normal pulses.     Heart sounds: Normal heart sounds.  Pulmonary:     Effort: Pulmonary effort is normal.     Comments: Crackles bilateral bases Abdominal:     General: Abdomen is flat.     Palpations: Abdomen is soft.  Musculoskeletal:     Cervical back: Normal range of motion and neck supple.     Comments: 2+ edema bilaterally  Skin:    General: Skin is warm.     Capillary Refill: Capillary refill takes less than 2 seconds.  Neurological:     General: No focal deficit present.     Mental Status: She is alert and oriented to person, place, and time.  Psychiatric:        Mood and Affect: Mood normal.        Behavior: Behavior normal.     ED Results / Procedures / Treatments   Labs (all labs ordered are listed, but only abnormal results are displayed) Labs Reviewed  CBC WITH DIFFERENTIAL/PLATELET - Abnormal; Notable for the following components:      Result Value   WBC 11.1 (*)    Abs Immature Granulocytes 0.16 (*)    All other components within normal limits  COMPREHENSIVE METABOLIC PANEL  BRAIN NATRIURETIC PEPTIDE  BASIC METABOLIC PANEL  TSH  LIPID PANEL  HEMOGLOBIN A1C  TROPONIN I (HIGH SENSITIVITY)    EKG None  Radiology No results found.  Procedures Procedures    Medications Ordered in ED Medications  clonazepam (KLONOPIN) disintegrating tablet 0.25 mg (has no administration in time range)  albuterol (VENTOLIN HFA) 108 (90 Base) MCG/ACT inhaler 2 puff (has no administration in time range)  loratadine (CLARITIN) tablet 10 mg (10 mg Oral Given 01/29/23 0938)  dapagliflozin propanediol (FARXIGA) tablet 10 mg  (10 mg Oral Given 01/29/23 0938)  levothyroxine (SYNTHROID) tablet 200 mcg (200 mcg Oral Given 01/29/23 0624)  pantoprazole (PROTONIX) EC tablet 40 mg (40 mg Oral Given 01/29/23 0938)  Semaglutide(0.25 or 0.5MG /DOS) SOPN 0.5 mg (0.5 mg Subcutaneous Not Given 01/29/23 1246)  furosemide (LASIX) tablet 40 mg (40 mg Oral Given 01/29/23 1607)  spironolactone (ALDACTONE) tablet 25 mg (25 mg Oral Given 01/29/23 0938)  lisinopril (ZESTRIL) tablet 2.5 mg (2.5 mg Oral Given 01/29/23 0938)  acetaminophen (TYLENOL)  tablet 650 mg (has no administration in time range)  alum & mag hydroxide-simeth (MAALOX/MYLANTA) 200-200-20 MG/5ML suspension 30 mL (has no administration in time range)  magnesium hydroxide (MILK OF MAGNESIA) suspension 30 mL (has no administration in time range)  haloperidol (HALDOL) tablet 5 mg (has no administration in time range)    Or  haloperidol lactate (HALDOL) injection 5 mg (has no administration in time range)  diphenhydrAMINE (BENADRYL) capsule 50 mg (has no administration in time range)    Or  diphenhydrAMINE (BENADRYL) injection 50 mg (has no administration in time range)  mirtazapine (REMERON) tablet 30 mg (has no administration in time range)  traZODone (DESYREL) tablet 100 mg (has no administration in time range)  sertraline (ZOLOFT) tablet 50 mg (50 mg Oral Given 01/29/23 1647)  ARIPiprazole (ABILIFY) tablet 2 mg (2 mg Oral Given 01/29/23 1647)  furosemide (LASIX) injection 40 mg (40 mg Intravenous Given 01/29/23 1827)    ED Course/ Medical Decision Making/ A&P                                 Medical Decision Making Gearlean Curnutte is a 42 y.o. female here presenting with shortness of breath.  Patient has about a 6 pound weight gain today.  Patient appears volume overloaded.  Plan to get CBC and CMP and BNP and chest x-ray.  Will give IV Lasix and reassess.  8:29 PM I reviewed patient's labs and her BNP is normal at 56.  Chest x-ray showed cardiomegaly but no pulmonary edema.   Patient was able to ambulate and her oxygen saturation remains normal.  Patient also urinated twice since given IV Lasix.  I discussed with the hospitalist regarding medication management of her Lasix.  10:57 PM Dr. Joneen Roach saw patient and agreed that patient is stable for discharge back to behavioral health.  I notified the behavioral health team.  Dr. Haroldine Laws will make some medication changes.  Patient can be transferred back if there is any concerns. I notified the [facility] team and discussed with NP, [place], regarding the plan of care to transfer back to [facility].   Problems Addressed: Leg swelling: chronic illness or injury Shortness of breath: acute illness or injury  Amount and/or Complexity of Data Reviewed Labs: ordered. Decision-making details documented in ED Course. Radiology: ordered and independent interpretation performed. Decision-making details documented in ED Course. ECG/medicine tests: ordered.  Risk Prescription drug management.    Final Clinical Impression(s) / ED Diagnoses Final diagnoses:  None    Rx / DC Orders ED Discharge Orders     None         Charlynne Pander, MD 01/29/23 2258    Charlynne Pander, MD 01/29/23 2322

## 2023-01-29 NOTE — Progress Notes (Signed)
   01/29/23 0900  Psych Admission Type (Psych Patients Only)  Admission Status Involuntary  Psychosocial Assessment  Patient Complaints Anxiety;Depression;Sadness  Eye Contact Fair  Facial Expression Anxious;Animated  Affect Anxious;Depressed  Furniture conservator/restorer  Appearance/Hygiene In scrubs  Behavior Characteristics Cooperative;Anxious  Mood Depressed;Anxious;Pleasant  Thought Process  Coherency WDL  Content WDL  Delusions None reported or observed  Perception WDL  Hallucination None reported or observed  Judgment WDL  Confusion None  Danger to Self  Current suicidal ideation? Denies

## 2023-01-29 NOTE — Plan of Care (Signed)
  Problem: Education: Goal: Emotional status will improve Outcome: Progressing Goal: Mental status will improve Outcome: Progressing Goal: Verbalization of understanding the information provided will improve Outcome: Progressing   Problem: Activity: Goal: Interest or engagement in activities will improve Outcome: Progressing Goal: Sleeping patterns will improve Outcome: Progressing   Problem: Coping: Goal: Ability to verbalize frustrations and anger appropriately will improve Outcome: Progressing Goal: Ability to demonstrate self-control will improve Outcome: Progressing   Problem: Health Behavior/Discharge Planning: Goal: Identification of resources available to assist in meeting health care needs will improve Outcome: Progressing Goal: Compliance with treatment plan for underlying cause of condition will improve Outcome: Progressing   

## 2023-01-29 NOTE — ED Notes (Signed)
Pt ambulated with a steady gait and without assistance. Pt ambulated with a pulse of around 95 and an O2 of around 98%.

## 2023-01-29 NOTE — Group Note (Signed)
Date:  01/29/2023 Time:  11:46 AM  Group Topic/Focus:  Goals Group:   The focus of this group is to help patients establish daily goals to achieve during treatment and discuss how the patient can incorporate goal setting into their daily lives to aide in recovery.    Participation Level:  Did Not Attend  Participation Quality:      Affect:      Cognitive:      Insight: None  Engagement in Group:    Modes of Intervention:      Additional Comments:     Reymundo Poll 01/29/2023, 11:46 AM

## 2023-01-29 NOTE — Consult Note (Incomplete)
PCP:   Wilfred Curtis, MD   Chief Complaint:  Shortness of breath  HPI: This a 42 year old female with past medical history of ADD, depression, OSA on CPAP, HFpEF, HTN, hypothyroidism, RLS, and T2DM.  Patient admitted to [facility] 01/29/2023 after a suicide attempt.  She is IVCed.  Patient attempted to OD on Norvasc, Coreg, Xanax.  These medications have been on hold since [facility] admission. Patient endorses increasing bilateral LE pitting edema since [facility] admission despite being maintained on  lasix 40 mg BID and spironolactone 25 mg QD.  These medications have not been interrupted.  Despite this she complains of shortness of breath and DOE.  She was sent to the ER.  Patient has been given her scheduled 40 mg p.o. Lasix and additional 40 mg p.o. prior to being sent to the ER.  In the ER patient vitals have been stable sats 97% on room air.Marland Kitchen  Potassium 3.1, BNP 56.6, troponin 4 => 4.  Patient's checks x-ray shows no acute abnormalities.  EKG NSR.  Patient given 40 mg IV Lasix X 1.  She reports significant improvement in her dyspneic symptoms.  She has ambulated in the ER and maintained sats in 95 - 98% on room air.  Review of Systems:  Per HPI  Past Medical History: Past Medical History:  Diagnosis Date   ADD (attention deficit disorder)    Anemia    post partum   Anemia requiring transfusions    of iron   Anxiety    Asthma    Blood transfusion without reported diagnosis    Carpal tunnel syndrome during pregnancy    CHF (congestive heart failure) (HCC)    Chicken pox    Classic migraine with aura 09/20/2015   Suzette Battiest disease (radial styloid tenosynovitis)    Depression    Ectopic pregnancy    Insomnia    Laceration of finger    left index with nerve involvement   Pneumonia    Postpartum hemorrhage    Pre-eclampsia in postpartum period    Pregnancy induced hypertension    Pulmonary edema    Recurrent upper respiratory infection (URI)    Sciatic pain    Subclinical hypothyroidism     sub-clinical   Urticaria    dogs   Vitamin D deficiency    Past Surgical History:  Procedure Laterality Date   BREAST BIOPSY Right    benign  punch bx   DILATION AND CURETTAGE OF UTERUS     DILATION AND EVACUATION N/A 09/04/2015   Procedure: DILATATION AND EVACUATION;  Surgeon: Mitchel Honour, DO;  Location: [facility] OR[facility];  Service: Gynecology;  Laterality: N/A;   DNC     I & D EXTREMITY Left 04/10/2017   Procedure: LEFT INDEX FINGER WOUND REPAIR AND NERVE REPAIR;  Surgeon: Bradly Bienenstock, MD;  Location: [facility] OR;  Service: Orthopedics;  Laterality: Left;   thyroidectomy      Medications: Prior to Admission medications   Medication Sig Start Date End Date Taking? Authorizing Provider  albuterol (VENTOLIN HFA) 108 (90 Base) MCG/ACT inhaler Inhale 2 puffs into the lungs every 6 (six) hours as needed for wheezing 08/22/21     ALPRAZolam (XANAX) 0.5 MG tablet Take 1 tablet (0.5 mg total) by mouth 3 (three) times daily only if needed 09/19/21     amLODipine (NORVASC) 5 MG tablet Take 1 tablet (5 mg total) by mouth daily. 11/26/22   Sharlene Dory, PA-C  baclofen (LIORESAL) 10 MG tablet Take 1 tablet (10 mg total)  by mouth 2 (two) times daily as needed. 04/11/22     carvedilol (COREG) 6.25 MG tablet Take 1 tablet (6.25 mg total) by mouth 2 (two) times daily. 10/15/22   Meriam Sprague, MD  cetirizine (ZYRTEC) 10 MG tablet Take 10 mg by mouth at bedtime as needed for allergies.    [provider]  Cholecalciferol 125 MCG (5000 UT) capsule     [provider]  dapagliflozin propanediol (FARXIGA) 10 MG TABS tablet Take 1 tablet (10 mg total) by mouth daily before breakfast. 10/11/22   Meriam Sprague, MD  EPINEPHrine 0.3 mg/0.3 mL IJ SOAJ injection Use device as directed for severe allergic reaction. 09/12/21     furosemide (LASIX) 40 MG tablet Take 1.5 tablets (60 mg total) by mouth daily. Patient taking differently: Take 40 mg by mouth as needed for fluid or edema. 11/26/22   Sharlene Dory, PA-C  levonorgestrel (MIRENA) 20 MCG/DAY IUD by Intrauterine route. 12/04/20   [provider]  levothyroxine (SYNTHROID) 200 MCG tablet Take 200 mcg by mouth every morning. 12/16/22   [provider]  lisinopril (ZESTRIL) 2.5 MG tablet Take 2.5 mg by mouth daily. 01/06/23 01/06/24  [provider]  mirtazapine (REMERON) 30 MG tablet Take 60 mg by mouth at bedtime.    [provider]  omeprazole (PRILOSEC) 40 MG capsule Take 40 mg by mouth daily. 12/07/22   [provider]  OZEMPIC, 0.25 OR 0.5 MG/DOSE, 2 MG/3ML SOPN Inject 0.5 mg into the skin once a week. 01/15/23   [provider]  potassium chloride SA (KLOR-CON M) 20 MEQ tablet Take 1.5 tablets (30 mEq total) by mouth daily. Patient taking differently: Take 20 mEq by mouth as needed (with Lasix). 11/26/22   Sharlene Dory, PA-C  spironolactone (ALDACTONE) 25 MG tablet Take 1 tablet (25 mg total) by mouth daily. 10/11/22   Meriam Sprague, MD  SYMBICORT 160-4.5 MCG/ACT inhaler Inhale 2 puffs into the lungs daily as needed (wheezing, shortness of breath). 10/09/22   [provider]  traZODone (DESYREL) 100 MG tablet Take 1 tablet (100 mg total) by mouth at bedtime. Patient taking differently: Take 200 mg by mouth at bedtime. 08/09/21       Allergies:   Allergies  Allergen Reactions   Bee Venom Anaphylaxis   Penicillins Shortness Of Breath, Swelling, Rash and Other (See Comments)   Sulfa Antibiotics Hives and Rash    Social History:  reports that she has quit smoking. Her smoking use included cigarettes. She has never used smokeless tobacco. She reports current alcohol use. She reports current drug use. Drug: Marijuana.  Family History: Family History  Problem Relation Age of Onset   Heart disease Mother        STENT PLACEMENT   Alcohol abuse Mother    Lung cancer Mother    Hyperlipidemia Mother    Mental illness Mother    Hyperthyroidism Mother    COPD Mother    Heart  attack Father        STENT PLACEMENT   Hyperlipidemia Father    Heart disease Father    Transient ischemic attack Father    Pulmonary embolism Father    AAA (abdominal aortic aneurysm) Father    Heart disease Sister    Mental illness Sister    Heart attack Sister    Diabetes Sister    Cancer Sister        CERVICAL   Heart attack Sister 53  Rheum arthritis Maternal Grandmother    Heart attack Maternal Grandfather 30   Heart disease Maternal Uncle 40   Colon cancer Paternal Uncle     Physical Exam: Vitals:   01/29/23 1631 01/29/23 1804 01/29/23 1805 01/29/23 1830  BP: 113/65  113/65 122/61  Pulse: 85  81 78  Resp: (!) 22  18 15   Temp: 98.2 F (36.8 C)     TempSrc: Oral     SpO2: 99%  99% 100%  Weight:  120.3 kg    Height:  5\' 2"  (1.575 m)      General:  Alert and oriented times three, well developed and nourished, no acute distress.  Comfortable on room air. Eyes: Pink conjunctiva, no scleral icterus ENT: Moist oral mucosa, neck supple.  No JVD Lungs: CTA B/L, no wheeze, no crackles, no use of accessory muscles Cardiovascular: RRR, no regurgitation, no gallops, no murmurs. No carotid bruits, no JVD Abdomen: soft, positive BS, NTND, not an acute abdomen GU: not examined Neuro: CN II - XII grossly intact, sensation intact Musculoskeletal: strength 5/5 all extremities. 2+ B/L LE edema Skin: no rash, no subcutaneous crepitation, no decubitus Psych: appropriate patient  Labs on Admission:  Recent Labs    01/27/23 1544 01/29/23 1756  NA 135 135  K 3.2* 3.1*  CL 107 102  CO2 20* 20*  GLUCOSE 98 103*  BUN 22* 11  CREATININE 0.70 0.54  CALCIUM 8.2* 8.6*  MG 2.1  --    Recent Labs    01/27/23 1544 01/29/23 1756  AST 27 30  ALT 48* 41  ALKPHOS 96 100  BILITOT 0.4 0.4  PROT 6.9 7.6  ALBUMIN 3.5 3.8    Recent Labs    01/27/23 1544 01/29/23 1756  WBC 10.3 11.1*  NEUTROABS  --  7.5  HGB 12.4 13.2  HCT 37.5 39.8  MCV 81.2 80.7  PLT 380 360     Radiological Exams on Admission: DG Chest Port 1 View  Result Date: 01/29/2023 CLINICAL DATA:  Shortness of breath EXAM: PORTABLE CHEST 1 VIEW COMPARISON:  Radiograph 01/27/2023 FINDINGS: Cardiomegaly. No focal consolidation, pleural effusion, or pneumothorax. No displaced rib fractures. IMPRESSION: No acute cardiopulmonary process.  Cardiomegaly. Electronically Signed   By: Minerva Fester M.D.   On: 01/29/2023 19:29    Assessment/Plan Present on Admission:  Acute diastolic heart failure, right-sided -Much improved after 40 mg IV Lasix.  Stable on room air.  -On chart review patient is gained 7 kg since patient admission 12/28/2022 -Patient stable to return to [facility].  Will give an additional 20mg  IV Lasix prior to ER discharge.  BMP, magnesium levels in AM.  Follow creatinine.   -Discussion had with [facility] NP.  They are unable to do IV Lasix.  At baseline patient maintained on 60 mg p.o. Lasix daily.  Increase to 60 mg PO BiD x 48 hours.  Spironolactone ordered, will continue. -Change diet to to low-sodium heart healthy diet.  Fluid restrict -Follow I/O's for the next 48 hours.  Daily weights -Most recent echo 6/24 EF 60 to 65%    Hypokalemia -Due to diuresis.  40 mill equivalents potassium given in ER. - in AM. -Continue spironolactone -BMP in a.m.   Mild persistent asthma -Restart Symbicort, continue Zyrtec -MDI as needed ordered.   Hypothyroidism -Synthroid resumed   HTN -Patient's home medications lisinopril, Coreg, Norvasc resumed   OSA -Patient on CPAP   Morbid obesity -On Ozempic for weight loss.  Hemoglobin A1c 4.9   Generalized  anxiety disorder and ADD //suicide attempt -Per BH  Glanda Spanbauer 01/29/2023, 9:00 PM

## 2023-01-29 NOTE — ED Notes (Signed)
Pt. Walked no oxygen desaturation noted.

## 2023-01-29 NOTE — [facility] Group Notes (Signed)
Spirituality group facilitated by Chaplain Katy Claussen, BCC.   Group Description: Group focused on topic of hope. Patients participated in facilitated discussion around topic, connecting with one another around experiences and definitions for hope. Group members engaged with visual explorer photos, reflecting on what hope looks like for them today. Group engaged in discussion around how their definitions of hope are present today in hospital.   Modalities: Psycho-social ed, Adlerian, Narrative, MI   Patient Progress: Did not attend.  

## 2023-01-30 DIAGNOSIS — F332 Major depressive disorder, recurrent severe without psychotic features: Secondary | ICD-10-CM | POA: Diagnosis not present

## 2023-01-30 DIAGNOSIS — I5033 Acute on chronic diastolic (congestive) heart failure: Secondary | ICD-10-CM

## 2023-01-30 LAB — URINALYSIS, ROUTINE W REFLEX MICROSCOPIC
Bilirubin Urine: NEGATIVE
Glucose, UA: >=500 mg/dL — AB
Hgb urine dipstick: NEGATIVE
Ketones, ur: NEGATIVE mg/dL
Leukocytes,Ua: NEGATIVE
Nitrite: NEGATIVE
Protein, ur: NEGATIVE mg/dL
Specific Gravity, Urine: 1.011 (ref 1.005–1.030)
pH: 5 (ref 5.0–8.0)

## 2023-01-30 LAB — BASIC METABOLIC PANEL
Anion gap: 12 (ref 5–15)
BUN: 12 mg/dL (ref 6–20)
CO2: 22 mmol/L (ref 22–32)
Calcium: 8.5 mg/dL — ABNORMAL LOW (ref 8.9–10.3)
Chloride: 100 mmol/L (ref 98–111)
Creatinine, Ser: 0.67 mg/dL (ref 0.44–1.00)
GFR, Estimated: >60 mL/min (ref >60)
Glucose, Bld: 114 mg/dL — ABNORMAL HIGH (ref 70–99)
Potassium: 3.4 mmol/L — ABNORMAL LOW (ref 3.5–5.1)
Sodium: 134 mmol/L — ABNORMAL LOW (ref 135–145)

## 2023-01-30 MED ORDER — MOMETASONE FURO-FORMOTEROL FUM 200-5 MCG/ACT IN AERO
2.0000 | INHALATION_SPRAY | Freq: Two times a day (BID) | RESPIRATORY_TRACT | Status: DC
  Administered 2023-01-30 – 2023-02-02 (×6): 2 via RESPIRATORY_TRACT
  Filled 2023-01-30: qty 8.8

## 2023-01-30 MED ORDER — POTASSIUM CHLORIDE CRYS ER 20 MEQ PO TBCR
40.0000 meq | EXTENDED_RELEASE_TABLET | ORAL | Status: AC
  Administered 2023-01-30 (×2): 40 meq via ORAL
  Filled 2023-01-30 (×3): qty 2

## 2023-01-30 MED ORDER — TRAZODONE HCL 150 MG PO TABS
150.0000 mg | ORAL_TABLET | Freq: Every day | ORAL | Status: DC
  Administered 2023-01-30: 150 mg via ORAL
  Filled 2023-01-30 (×2): qty 1

## 2023-01-30 MED ORDER — FUROSEMIDE 40 MG PO TABS
60.0000 mg | ORAL_TABLET | Freq: Two times a day (BID) | ORAL | Status: DC
  Administered 2023-01-30: 60 mg via ORAL
  Filled 2023-01-30 (×4): qty 1

## 2023-01-30 MED ORDER — FUROSEMIDE 40 MG PO TABS
60.0000 mg | ORAL_TABLET | Freq: Every day | ORAL | Status: DC
  Filled 2023-01-30: qty 1

## 2023-01-30 MED ORDER — TORSEMIDE 20 MG PO TABS
40.0000 mg | ORAL_TABLET | Freq: Two times a day (BID) | ORAL | Status: DC
  Administered 2023-01-30 – 2023-01-31 (×2): 40 mg via ORAL
  Filled 2023-01-30 (×6): qty 2

## 2023-01-30 MED ORDER — TRAZODONE HCL 100 MG PO TABS
100.0000 mg | ORAL_TABLET | Freq: Once | ORAL | Status: AC
  Administered 2023-01-30: 100 mg via ORAL
  Filled 2023-01-30 (×2): qty 1

## 2023-01-30 MED ORDER — FUROSEMIDE 40 MG PO TABS: 60.0000 mg | ORAL_TABLET | Freq: Every day | ORAL | Status: DC

## 2023-01-30 MED ORDER — POLYETHYLENE GLYCOL 3350 17 G PO PACK
17.0000 g | PACK | Freq: Every day | ORAL | Status: DC | PRN
  Administered 2023-01-31: 17 g via ORAL
  Filled 2023-01-30: qty 1

## 2023-01-30 MED ORDER — FUROSEMIDE 40 MG PO TABS
40.0000 mg | ORAL_TABLET | Freq: Every day | ORAL | Status: DC
  Filled 2023-01-30: qty 1

## 2023-01-30 NOTE — Progress Notes (Signed)

## 2023-01-30 NOTE — [facility] Group Notes (Signed)
[facility] Group Notes:  (Nursing/MHT/Case Management/Adjunct)  Date:  01/30/2023  Time:  2000  Type of Therapy:   Wrap up group  Participation Level:  Active  Participation Quality:  Appropriate, Attentive, Sharing, and Supportive  Affect:  Appropriate  Cognitive:  Alert  Insight:  Improving  Engagement in Group:  Engaged  Modes of Intervention:  Clarification, Education, and Support  Summary of Progress/Problems: Positive thinking and positive change were discussed.   [name] 01/30/2023, 8:49 PM

## 2023-01-30 NOTE — [facility] Suicide Risk Assessment (Signed)
[facility] INPATIENT:  Family/Significant Other Suicide Prevention Education  Suicide Prevention Education:  Education Completed; Marcelina Morel [id number] [phone],   has been identified by the patient as the family member/significant other with whom the patient will be residing, and identified as the person(s) who will aid the patient in the event of a mental health crisis (suicidal ideations/suicide attempt).  With written consent from the patient, the family member/significant other has been provided the following suicide prevention education, prior to the and/or following the discharge of the patient.  The suicide prevention education provided includes the following: Suicide risk factors Suicide prevention and interventions National Suicide Hotline telephone number [facility] assessment telephone number [facility] Emergency Assistance 911 [facility] and/or Residential Mobile Crisis Unit telephone number  Request made of family/significant other to: Remove weapons (e.g., guns, rifles, knives), all items previously/currently identified as safety concern.   Remove drugs/medications (over-the-counter, prescriptions, illicit drugs), all items previously/currently identified as a safety concern.  The family member/significant other verbalizes understanding of the suicide prevention education information provided.  The family member/significant other agrees to remove the items of safety concern listed above.  Cynthia Townsend 01/30/2023, 12:06 PM

## 2023-01-30 NOTE — Progress Notes (Signed)
[facility] MD Progress Note  01/30/2023 8:58 AM Cynthia Townsend  MRN:  [id number]  Principal Problem: Bipolar affective disorder (HCC) Diagnosis: Principal Problem:   Bipolar affective disorder (HCC) Active Problems:   Generalized anxiety disorder   Hypothyroidism   Mild persistent asthma   Hypokalemia   HTN (hypertension)   MDD (major depressive disorder), recurrent severe, without psychosis (HCC)   Acute diastolic heart failure (HCC)   Reason for Admission:  Cynthia Townsend is a 42 yo female with a reported PPHx of depression, anxiety, ADHD, eating disorders (bulimia, anorexia, binging)and a prior diagnosis of bipolar disorder. She has 1 prior psychiatric hospitalization and reports a history of chronic depression and suicidal ideations. She arrived to [facility] on 01/27/2023 for SI with attempted overdose of prescribed medications in the setting of worsening depression and multiple chronic health conditions. She was admitted to [facility] on 01/28/2023 under IVC. PMHx is significant for HTN, asthma, hypothyroidism s/p thyroidectomy (Feb 2024), CHF, and OSA on CPAP.  (admitted on 01/28/2023, total  LOS: 2 days )  Yesterday, the psychiatry team made following recommendations:  Patient was sent to [facility] yesterday for SOB for concerns fo acute on chronic CHF exacerbation Discontinue Xanax Start Klonopin 0.5 mg twice daily as needed for anxiety We plan on discontinuing Remeron, will start the process of tapering off Decrease Remeron 60 mg to 30 mg nightly tonight Start Zoloft 50 mg for depression, with plan to titrate up to 100 mg by end of hospitalization Start Abilify 2 mg for mood stabilization, with plan to titrate up to 5 mg by end of hospitalization Continue trazodone 100 mg nightly for insomnia  Information Obtained Today During Patient Interview:  Patient evaluated at bedside this a.m. reports improvement in depression and anxiety since yesterday, states "I feel like I have a pep in my step".  Reports  sleep has been poor, request that her trazodone 100 mg nightly be increased.  She reports passive SI today, improved from yesterday.  She rates her anxiety today a 2/10, 10 being severe anxiety.  Patient denies any side effects to currently prescribed psychiatric medications.  Denies any homicidal ideation, hallucinations, paranoia, or delusional thought processes.  Patient reports shortness of breath has improved since yesterday, continues to have BLE swelling.  Continues to have difficulty ambulating without getting out of breath.   Past Psychiatric Hx: Current Psychiatrist: Dr. Tonna Corner at [facility]  Current Therapist: Denies Previous Psych Diagnoses: Anxiety diagnosed in her early 75s, bipolar disorder, depression, ADD Current psychiatric medications: Reports Xanax 0.5 mg 3 times daily for anxiety, mirtazapine 60 mg nightly for depression, and trazodone 100 mg nightly for insomnia Psychiatric medication history: Reports previously trying lithium, Lexapro, Depakote, Celexa and a short time period.  Reports feeling very "crazy" during this time.  Is unable to specify if any of these medications were helpful Reports tremors while on Vraylar Reports Zoloft when she had given birth to her son.  At that time she was on 50 mg, did not find this medication helpful.  Denied any side effects to this medication in the past Psychotherapy hx: Denies but received unspecified therapy sessions on coping skills History of suicide: as described in the HPI History of homicide or aggression: as described in the HPI Psychiatric medication compliance history: Neuromodulation history: None identified on chart review     Substance Abuse Hx: Alcohol: Reports infrequent use.  Tobacco: Quit Oct 2023 "cold [place]". Prior to that she smoked 0.5 ppd for 10 years.  Cannabis: consumes delta  8 gummies once every other week. Reports it helps her relax.  Illicit drugs: No recent illicit substance use.  Rx drug  abuse: Used adderall in the past for weight loss Rehab hx: Denies   Past Medical History: PCP: Dr. Wilfred Curtis, MD  Dx: Anemia, HTN, asthma, hypothyroidism s/p thyroidectomy,HLD, migraines, Meds:  Amlodipine (HTN), budesonide-formoterol fumarate (asthma), carvedilol (palpitations in past, has SOB + edema on this med), metformi, levothyroxine, furosemide (CHF), omeprazole, spironolactone, semaglutide ALL: penicillins, sulfa, bee venom Hosp: Not formally assessed Surgeries: Thyroidectomy in Feb 2023 after identifying malignancy on biopsy Trauma: No head trauma, concussions, LOC identified on chart review Seizures: None identified on chart review   Reproductive Psychiatric History: Mood shifts around menses: Denies Pregnancy history:  2017: led to post birth pulm edema and eclampsia and post partum hemorrhage  Perinatal/postpartum psychiatric symptoms: Peripartum anxiety and panic attacks Lactating: No Planning to become pregnant: No  LMP: Irregular 2/2 IUD Contraceptives: IUD for last 3 years   Family History: Psych: Mom- alcohol use disorder Substance use family hx: Reports 3 sisters have substance use issues   Social History: Living situation: Lives with ex-fiance, [place], and her two children in [place]. She also has a boxer named Scientist, clinical (histocompatibility and immunogenetics).  Education: completed 2 associates degree Work: has been Engineer, site since 2014 Marital Status: Single Children: Has 2 children, Cynthia Townsend 7 yo, Cynthia Townsend 42yo Abuse: None except as detailed in HPI Legal: Denies Military: No     Past Medical History:  Past Medical History:  Diagnosis Date   ADD (attention deficit disorder)    Anemia    post partum   Anemia requiring transfusions    of iron   Anxiety    Asthma    Blood transfusion without reported diagnosis    Carpal tunnel syndrome during pregnancy    CHF (congestive heart failure) (HCC)    Chicken pox    Classic migraine with aura 09/20/2015   Suzette Battiest disease (radial  styloid tenosynovitis)    Depression    Ectopic pregnancy    Insomnia    Laceration of finger    left index with nerve involvement   Pneumonia    Postpartum hemorrhage    Pre-eclampsia in postpartum period    Pregnancy induced hypertension    Pulmonary edema    Recurrent upper respiratory infection (URI)    Sciatic pain    Subclinical hypothyroidism    sub-clinical   Urticaria    dogs   Vitamin D deficiency    Family History:  Family History  Problem Relation Age of Onset   Heart disease Mother        STENT PLACEMENT   Alcohol abuse Mother    Lung cancer Mother    Hyperlipidemia Mother    Mental illness Mother    Hyperthyroidism Mother    COPD Mother    Heart attack Father        STENT PLACEMENT   Hyperlipidemia Father    Heart disease Father    Transient ischemic attack Father    Pulmonary embolism Father    AAA (abdominal aortic aneurysm) Father    Heart disease Sister    Mental illness Sister    Heart attack Sister    Diabetes Sister    Cancer Sister        CERVICAL   Heart attack Sister 2   Rheum arthritis Maternal Grandmother    Heart attack Maternal Grandfather 30   Heart disease Maternal Uncle 40   Colon cancer Paternal Uncle  Current Medications: Current Facility-Administered Medications  Medication Dose Route Frequency Provider Last Rate Last Admin   acetaminophen (TYLENOL) tablet 650 mg  650 mg Oral Q6H PRN Attiah, Nadir, MD       albuterol (VENTOLIN HFA) 108 (90 Base) MCG/ACT inhaler 2 puff  2 puff Inhalation Q6H PRN Abbott Pao, Nadir, MD       alum & mag hydroxide-simeth (MAALOX/MYLANTA) 200-200-20 MG/5ML suspension 30 mL  30 mL Oral Q4H PRN Abbott Pao, Nadir, MD       ARIPiprazole (ABILIFY) tablet 2 mg  2 mg Oral Daily Carrion-Carrero, Kashana Breach, MD   2 mg at 01/29/23 1647   clonazePAM (KLONOPIN) disintegrating tablet 0.25 mg  0.25 mg Oral TID PRN Sarita Bottom, MD       dapagliflozin propanediol (FARXIGA) tablet 10 mg  10 mg Oral QAC breakfast Abbott Pao,  Nadir, MD   10 mg at 01/30/23 0645   diphenhydrAMINE (BENADRYL) capsule 50 mg  50 mg Oral TID PRN Sarita Bottom, MD       Or   diphenhydrAMINE (BENADRYL) injection 50 mg  50 mg Intramuscular TID PRN Abbott Pao, Nadir, MD       furosemide (LASIX) tablet 60 mg  60 mg Oral BID Joneen Roach, Debby, MD   60 mg at 01/30/23 0757   Followed by   Melene Muller ON 02/01/2023] furosemide (LASIX) tablet 60 mg  60 mg Oral Daily Crosley, Debby, MD       haloperidol (HALDOL) tablet 5 mg  5 mg Oral TID PRN Abbott Pao, Nadir, MD       Or   haloperidol lactate (HALDOL) injection 5 mg  5 mg Intramuscular TID PRN Abbott Pao, Nadir, MD       levothyroxine (SYNTHROID) tablet 200 mcg  200 mcg Oral Q0600 Abbott Pao, Nadir, MD   200 mcg at 01/30/23 0647   lisinopril (ZESTRIL) tablet 2.5 mg  2.5 mg Oral Daily Attiah, Nadir, MD   2.5 mg at 01/30/23 0758   loratadine (CLARITIN) tablet 10 mg  10 mg Oral Daily Attiah, Nadir, MD   10 mg at 01/30/23 0758   magnesium hydroxide (MILK OF MAGNESIA) suspension 30 mL  30 mL Oral Daily PRN Abbott Pao, Nadir, MD       mirtazapine (REMERON) tablet 30 mg  30 mg Oral QHS Carrion-Carrero, Lashika Erker, MD   30 mg at 01/29/23 2121   mometasone-formoterol (DULERA) 200-5 MCG/ACT inhaler 2 puff  2 puff Inhalation BID Crosley, Debby, MD       pantoprazole (PROTONIX) EC tablet 40 mg  40 mg Oral Daily Attiah, Nadir, MD   40 mg at 01/30/23 0759   Semaglutide(0.25 or 0.5MG /DOS) SOPN 0.5 mg  0.5 mg Subcutaneous Weekly Attiah, Nadir, MD       sertraline (ZOLOFT) tablet 50 mg  50 mg Oral Daily Carrion-Carrero, Quince Santana, MD   50 mg at 01/29/23 1647   spironolactone (ALDACTONE) tablet 25 mg  25 mg Oral Daily Attiah, Nadir, MD   25 mg at 01/30/23 0758   traZODone (DESYREL) tablet 100 mg  100 mg Oral QHS Carrion-Carrero, Mikia Delaluz, MD   100 mg at 01/29/23 2121    Lab Results:  Results for orders placed or performed during the hospital encounter of 01/28/23 (from the past 48 hour(s))  CBC with Differential     Status: Abnormal   Collection  Time: 01/29/23  5:56 PM  Result Value Ref Range   WBC 11.1 (H) 4.0 - 10.5 K/uL   RBC 4.93 3.87 - 5.11 MIL/uL   Hemoglobin 13.2 12.0 - 15.0  g/dL   HCT 45.4 09.8 - 11.9 %   MCV 80.7 80.0 - 100.0 fL   MCH 26.8 26.0 - 34.0 pg   MCHC 33.2 30.0 - 36.0 g/dL   RDW 14.7 82.9 - 56.2 %   Platelets 360 150 - 400 K/uL   nRBC 0.2 0.0 - 0.2 %   Neutrophils Relative % 68 %   Neutro Abs 7.5 1.7 - 7.7 K/uL   Lymphocytes Relative 21 %   Lymphs Abs 2.3 0.7 - 4.0 K/uL   Monocytes Relative 6 %   Monocytes Absolute 0.7 0.1 - 1.0 K/uL   Eosinophils Relative 3 %   Eosinophils Absolute 0.3 0.0 - 0.5 K/uL   Basophils Relative 1 %   Basophils Absolute 0.1 0.0 - 0.1 K/uL   Immature Granulocytes 1 %   Abs Immature Granulocytes 0.16 (H) 0.00 - 0.07 K/uL    Comment: Performed at [facility], [address] [place], [place] [address]  Comprehensive metabolic panel     Status: Abnormal   Collection Time: 01/29/23  5:56 PM  Result Value Ref Range   Sodium 135 135 - 145 mmol/L   Potassium 3.1 (L) 3.5 - 5.1 mmol/L   Chloride 102 98 - 111 mmol/L   CO2 20 (L) 22 - 32 mmol/L   Glucose, Bld 103 (H) 70 - 99 mg/dL    Comment: Glucose reference range applies only to samples taken after fasting for at least 8 hours.   BUN 11 6 - 20 mg/dL   Creatinine, Ser 5.78 0.44 - 1.00 mg/dL   Calcium 8.6 (L) 8.9 - 10.3 mg/dL   Total Protein 7.6 6.5 - 8.1 g/dL   Albumin 3.8 3.5 - 5.0 g/dL   AST 30 15 - 41 U/L   ALT 41 0 - 44 U/L   Alkaline Phosphatase 100 38 - 126 U/L   Total Bilirubin 0.4 0.3 - 1.2 mg/dL   GFR, Estimated >46 >96 mL/min    Comment: (NOTE) Calculated using the CKD-EPI Creatinine Equation (2021)    Anion gap 13 5 - 15    Comment: Performed at [facility], [address], [place], [place] [address]  Brain natriuretic peptide     Status: None   Collection Time: 01/29/23  5:56 PM  Result Value Ref Range   B Natriuretic Peptide 56.6 0.0 - 100.0 pg/mL    Comment:  Performed at [facility], [address] [place], [place] [address]  Troponin I (High Sensitivity)     Status: None   Collection Time: 01/29/23  5:56 PM  Result Value Ref Range   Troponin I (High Sensitivity) 4 <18 ng/L    Comment: (NOTE) Elevated high sensitivity troponin I (hsTnI) values and significant  changes across serial measurements may suggest ACS but many other  chronic and acute conditions are known to elevate hsTnI results.  Refer to the "Links" section for chest pain algorithms and additional  guidance. Performed at [facility], [address] [place], [place] [address]   Troponin I (High Sensitivity)     Status: None   Collection Time: 01/29/23  9:28 PM  Result Value Ref Range   Troponin I (High Sensitivity) 4 <18 ng/L    Comment: (NOTE) Elevated high sensitivity troponin I (hsTnI) values and significant  changes across serial measurements may suggest ACS but many other  chronic and acute conditions are known to elevate hsTnI results.  Refer to the "Links" section for chest  pain algorithms and additional  guidance. Performed at [facility], [address] [place], [place] [address]   Magnesium     Status: None   Collection Time: 01/29/23  9:28 PM  Result Value Ref Range   Magnesium 1.8 1.7 - 2.4 mg/dL    Comment: Performed at [facility], [address], [place], [place] [address]  Basic metabolic panel     Status: Abnormal   Collection Time: 01/30/23  6:27 AM  Result Value Ref Range   Sodium 134 (L) 135 - 145 mmol/L   Potassium 3.4 (L) 3.5 - 5.1 mmol/L   Chloride 100 98 - 111 mmol/L   CO2 22 22 - 32 mmol/L   Glucose, Bld 114 (H) 70 - 99 mg/dL    Comment: Glucose reference range applies only to samples taken after fasting for at least 8 hours.   BUN 12 6 - 20 mg/dL   Creatinine, Ser 2.59 0.44 - 1.00 mg/dL   Calcium 8.5 (L) 8.9 - 10.3 mg/dL   GFR, Estimated >56 >38 mL/min     Comment: (NOTE) Calculated using the CKD-EPI Creatinine Equation (2021)    Anion gap 12 5 - 15    Comment: Performed at [facility], [address] [place], [place] [address]    Blood Alcohol level:  Lab Results  Component Value Date   ETH <10 01/27/2023    Metabolic Labs: Lab Results  Component Value Date   HGBA1C 4.9 06/26/2017   No results found for: "PROLACTIN" No results found for: "CHOL", "TRIG", "HDL", "CHOLHDL", "VLDL", "LDLCALC"  Sleep:Sleep: Poor   Physical Findings: AIMS: No  CIWA:    COWS:     Psychiatric Specialty Exam:  Presentation  General Appearance: Appropriate for Environment; Casual; Fairly Groomed  Eye Contact:Fair; Absent; Good  Speech:Clear and Coherent; Normal Rate  Speech Volume:Normal  Handedness:-- (not assessed)   Mood and Affect  Mood:-- ("I feel like I have a pep in my step")  Affect:Appropriate; Full Range; Congruent   Thought Process  Thought Processes:Coherent; Goal Directed; Linear  Descriptions of Associations:Intact  Orientation:Full (Time, Place and Person)  Thought Content:Logical; WDL  History of Schizophrenia/Schizoaffective disorder:No data recorded Duration of Psychotic Symptoms:No data recorded Hallucinations:Hallucinations: None  Ideas of Reference:None  Suicidal Thoughts:Suicidal Thoughts: Yes, Passive (reports it is less prominent today) SI Active Intent and/or Plan: Without Intent; Without Plan SI Passive Intent and/or Plan: Without Intent; Without Plan  Homicidal Thoughts:Homicidal Thoughts: No   Sensorium  Memory:Immediate Fair  Judgment:Fair  Insight:Fair   Executive Functions  Concentration:Fair  Attention Span:Fair  Recall:Fair  Fund of Knowledge:Fair  Language:Fair   Psychomotor Activity  Psychomotor Activity:Psychomotor Activity: Normal   Assets  Assets:Communication Skills; Desire for Improvement; Resilience   Sleep  Sleep:Sleep:  Poor    Physical Exam: Physical Exam Review of Systems  Constitutional:  Negative for chills and fever.  Respiratory:  Positive for shortness of breath.   Cardiovascular:  Positive for leg swelling.   Blood pressure 121/71, pulse 83, temperature 98.6 F (37 C), temperature source Oral, resp. rate 20, height 5\' 2"  (1.575 m), weight 120.3 kg, SpO2 99%. Body mass index is 48.51 kg/m.  Treatment Plan Summary: Daily contact with patient to assess and evaluate symptoms and progress in treatment and Medication management   ASSESSMENT: Izzabel Dumire is a 42 yo female with a reported PPHx of depression, anxiety, ADHD, eating disorders (bulimia, anorexia, binging)and a prior diagnosis of bipolar disorder. She has 1 prior  psychiatric hospitalization and reports a history of chronic depression and suicidal ideations. She arrived to [facility] on 01/27/2023 for SI with attempted overdose of prescribed medications in the setting of worsening depression and multiple chronic health conditions. She was admitted to [facility] on 01/28/2023 under IVC. PMHx is significant for HTN, asthma, hypothyroidism s/p thyroidectomy (Feb 2024), CHF, and OSA on CPAP   8/21: Transferred to [facility] for concerns of acute on chronic CHF exacerbation.   Diagnoses / Active Problems: Persistent Depressive Disorder (dysthymia) GAD with panic attacks PTSD Binge eating disorder Tobacco use disorder, in sustained remission Stimulant use disorder (adderall), in sustained remission R/o kleptomania Rule out borderline personality disorder  PLAN: Safety and Monitoring:  -- INVOLUNTARY admission to inpatient psychiatric unit for safety, stabilization and treatment  -- Daily contact with patient to assess and evaluate symptoms and progress in treatment  -- Patient's case to be discussed in multi-disciplinary team meeting  -- Observation Level : q15 minute checks  -- Vital signs:  q12 hours  -- Precautions: suicide, elopement, and  assault  2. Psychiatric Diagnoses and Treatment:  Discontinue Xanax on admission Klonopin 0.5 mg twice daily as needed for anxiety We plan on discontinuing Remeron, will start the process of tapering off Decrease Remeron 30 to 15 mg Continue Zoloft 50 mg for depression, low threshold to d/c if patient starts to become manic Continue Abilify 2 mg for mood stabilization, with plan to titrate up to 5 mg by end of hospitalization Increase trazodone 100 mg to 150 mgnightly for insomnia -- The risks/benefits/side-effects/alternatives to this medication were discussed in detail with the patient and time was given for questions. The patient consents to medication trial.              -- Metabolic profile and EKG monitoring obtained while on an atypical antipsychotic  BMI: 47.26 kg/m2 TSH: pending Lipid Panel: pending HbgA1c: pending QTc: 461 on 01/29/2023             -- Encouraged patient to participate in unit milieu and in scheduled group therapies   -- Short Term Goals: Ability to identify changes in lifestyle to reduce recurrence of condition will improve and Ability to verbalize feelings will improve  -- Long Term Goals: Improvement in symptoms so as ready for discharge    3. Medical Issues Being Addressed:   [facility] consulted, confirmed patient will be followed by Dr. Lowell Guitar, MD. Appreciate their collaboration, see their note for today's recommendations.   #Acute diastolic HF, R-sided (LVEF 60-65% 11/2022) I/Os Satting appropriately, VSS Low sodium diet  Lasix 60 mg PO BiD x 48 hours.   #HTN Home meds were restarted in ED: lisinopril, Coreg, Norvasc   #Hypokalemia K+ 3.4 on 01/30/2023 Start Kcl 40 mEq x2  Repeat BMP tomorrow  Continue spironolactone  #Mild persistent asthma Symbicort  Zyrtec MDI PRN  #Hypothyroidism TSH pending Synthroid 200 mcg daily  #OSA CPAP  #Morbid Obesity #T2DM, controlled (A1C 4.9%) Semaglutide for weight loss   4. Discharge  Planning:   -- Social work and case management to assist with discharge planning and identification of hospital follow-up needs prior to discharge  -- Estimated LOS: 5-7 days  -- Discharge Concerns: Need to establish a safety plan; Medication compliance and effectiveness  -- Discharge Goals: Return home with outpatient referrals for mental health follow-up including medication management/psychotherapy   I certify that inpatient services furnished can reasonably be expected to improve the patient's condition.   This note was created using a voice  recognition software as a result there may be grammatical errors inadvertently enclosed that do not reflect the nature of this encounter. Every attempt is made to correct such errors.   Dr. Liston Alba, MD PGY-2, Psychiatry Residency  8/22/20248:58 AM

## 2023-01-30 NOTE — Consult Note (Signed)
PROGRESS NOTE    Cynthia Townsend  ZOX:[id number] DOB: 06/09/1981 DOA: 01/28/2023 PCP: Wilfred Curtis, MD  Chief Complaint  Patient presents with   Leg Swelling    Brief Narrative:    42 year old female with past medical history of ADD, depression, OSA on CPAP, HFpEF, HTN, hypothyroidism, RLS, and T2DM.  Patient admitted to [facility] 01/29/2023 after Cynthia Townsend suicide attempt.  She is IVCed.  Patient attempted to OD on Norvasc, Coreg, Xanax.  These medications have been on hold since [facility] admission. Patient endorses increasing bilateral LE pitting edema since [facility] admission despite being maintained on  lasix 40 mg BID and spironolactone 25 mg QD.  These medications have not been interrupted.  Despite this she complains of shortness of breath and DOE.  She was sent to the ER.  Patient has been given her scheduled 40 mg p.o. Lasix and additional 40 mg p.o. prior to being sent to the ER.   Assessment & Plan:   Principal Problem:   Bipolar affective disorder (HCC) Active Problems:   Generalized anxiety disorder   Hypothyroidism   Mild persistent asthma   Hypokalemia   HTN (hypertension)   MDD (major depressive disorder), recurrent severe, without psychosis (HCC)   Acute diastolic heart failure (HCC)  Heart Failure with preserved Ejection Fraction with Exacerbation Shortness of breath with exertion  Lower Extremity Swelling - reportedly compliant with low salt diet, meds (coreg, norvasc were held on admission) - notes continued SOB with exertion, orthopnea (required extra pillow) - 116.8 kg 6/18, now 120.3 kg - will transition to torsemide 40 mg BID - continue spironolactone - echo 11/2022 with EF 60-65%, no RWMA - will check UA for proteinuria.  LFT's wnl 8/21. - daily weights  Wt Readings from Last 3 Encounters:  01/29/23 120.3 kg  01/29/23 120.3 kg  01/27/23 117.9 kg   Hypokalemia Replace and follow   Mild persistent asthma -Restart Symbicort, continue Zyrtec -MDI as needed ordered. No  wheezing on exam    Hypothyroidism -Synthroid resumed -follow TSH    HTN -lisinopril -coreg and norvasc on hold     OSA -Patient on CPAP    Morbid obesity -On Ozempic for weight loss.  Hemoglobin A1c 4.9    Generalized anxiety disorder and ADD //suicide attempt -Per [facility]    Family Communication: none Disposition: per psychiatry who is primary  Procedures:  none  Antimicrobials:  Anti-infectives (From admission, onward)    None       Subjective: Notes SOB with exertion, orthopnea  Objective: Vitals:   01/29/23 2250 01/29/23 2252 01/30/23 0637 01/30/23 0639  BP:  (!) 112/54 (!) 111/56 121/71  Pulse:  81 87 83  Resp:  (!) 23 20   Temp: 98.5 F (36.9 C)   98.6 F (37 C)  TempSrc: Oral   Oral  SpO2:  97% 99%   Weight:      Height:       No intake or output data in the 24 hours ending 01/30/23 1455 Filed Weights   01/28/23 1550 01/29/23 1804  Weight: 117.2 kg 120.3 kg    Examination:  General exam: Appears calm and comfortable  Respiratory system: Clear to auscultation. Cardiovascular system: RRR Gastrointestinal system: Abdomen is nondistended, soft and nontender.  Central nervous system: Alert and oriented. No focal neurological deficits. Extremities: bilateral LE edema   Data Reviewed: I have personally reviewed following labs and imaging studies  CBC: Recent Labs  Lab 01/27/23 1544 01/29/23 1756  WBC 10.3 11.1*  NEUTROABS  --  7.5  HGB 12.4 13.2  HCT 37.5 39.8  MCV 81.2 80.7  PLT 380 360    Basic Metabolic Panel: Recent Labs  Lab 01/27/23 1544 01/29/23 1756 01/29/23 2128 01/30/23 0627  NA 135 135  --  134*  K 3.2* 3.1*  --  3.4*  CL 107 102  --  100  CO2 20* 20*  --  22  GLUCOSE 98 103*  --  114*  BUN 22* 11  --  12  CREATININE 0.70 0.54  --  0.67  CALCIUM 8.2* 8.6*  --  8.5*  MG 2.1  --  1.8  --     GFR: Estimated Creatinine Clearance: 113.1 mL/min (by C-G formula based on SCr of 0.67 mg/dL).  Liver Function  Tests: Recent Labs  Lab 01/27/23 1544 01/29/23 1756  AST 27 30  ALT 48* 41  ALKPHOS 96 100  BILITOT 0.4 0.4  PROT 6.9 7.6  ALBUMIN 3.5 3.8    CBG: Recent Labs  Lab 01/27/23 1537  GLUCAP 95     No results found for this or any previous visit (from the past 240 hour(s)).       Radiology Studies: DG Chest Port 1 View  Result Date: 01/29/2023 CLINICAL DATA:  Shortness of breath EXAM: PORTABLE CHEST 1 VIEW COMPARISON:  Radiograph 01/27/2023 FINDINGS: Cardiomegaly. No focal consolidation, pleural effusion, or pneumothorax. No displaced rib fractures. IMPRESSION: No acute cardiopulmonary process.  Cardiomegaly. Electronically Signed   By: Minerva Fester M.D.   On: 01/29/2023 19:29        Scheduled Meds:  ARIPiprazole  2 mg Oral Daily   dapagliflozin propanediol  10 mg Oral QAC breakfast   levothyroxine  200 mcg Oral Q0600   lisinopril  2.5 mg Oral Daily   loratadine  10 mg Oral Daily   mirtazapine  30 mg Oral QHS   mometasone-formoterol  2 puff Inhalation BID   pantoprazole  40 mg Oral Daily   potassium chloride  40 mEq Oral Q4H   Semaglutide(0.25 or 0.5MG /DOS)  0.5 mg Subcutaneous Weekly   sertraline  50 mg Oral Daily   spironolactone  25 mg Oral Daily   torsemide  40 mg Oral BID   traZODone  150 mg Oral QHS   Continuous Infusions:   LOS: 2 days    Time spent: over 30 min    Lacretia Nicks, MD [facility]   To contact the attending provider between 7A-7P or the covering provider during after hours 7P-7A, please log into the web site [email or web address] and access using universal [place] password for that web site. If you do not have the password, please call the hospital operator.  01/30/2023, 2:55 PM

## 2023-01-30 NOTE — Group Note (Signed)
LCSW Group Therapy Note   Group Date: 01/30/2023 Start Time: 1100 End Time: 1200  Type of Therapy and Topic:  Group Therapy - Who Am I?  Participation Level:  Active  Description of Group The focus of this group was to aid patients in self-exploration and awareness. Patients were guided in exploring various factors of oneself to include interests, readiness to change, management of emotions, and individual perception of self. Patients were provided with complementary worksheets exploring hidden talents, ease of asking other for help, music/media preferences, understanding and responding to feelings/emotions, and hope for the future. At group closing, patients were encouraged to adhere to discharge plan to assist in continued self-exploration and understanding.  Therapeutic Goals Patients learned that self-exploration and awareness is an ongoing process Patients identified their individual skills, preferences, and abilities Patients explored their openness to establish and confide in supports Patients explored their readiness for change and progression of mental health  Summary of Patient Progress:  Patient actively engaged in introductory check-in. Patient actively engaged in activity of self-exploration and identification, completing complementary worksheet to assist in discussion. Patient identified various factors ranging from hidden talents, favorite music and movies, trusted individuals, accountability, and individual perceptions of self and hope. Pt engaged in processing thoughts and feelings as well as means of reframing thoughts. Pt proved receptive of alternate group members input and feedback from CSW.   Therapeutic Modalities Cognitive Behavioral Therapy Motivational Interviewing Kathrynn Humble 01/30/2023  12:32 PM

## 2023-01-30 NOTE — Progress Notes (Signed)
Patient received PRN clonazepam at 1905. Patient was visibly anxious and had just received news that her father of 84 years had fallen down.

## 2023-01-30 NOTE — Progress Notes (Addendum)
Patient denies SI, HI and AVH. Patient rates depression 3/10 and anxiety 3/10. States she feels more hopeful since arriving. Patient complained of increased lower extremity edema today. Patient denies worsening of respiratory symptoms, is still experiencing shortness of breath on exertion. Patient has received all scheduled medications with no issues. Remains safe on the unit Q 15 minute safety checks ongoing.   01/30/23 0800  Psych Admission Type (Psych Patients Only)  Admission Status Involuntary  Psychosocial Assessment  Patient Complaints Anxiety;Depression  Eye Contact Fair  Facial Expression Anxious;Animated  Affect Anxious;Depressed  Surveyor, minerals Activity Slow  Appearance/Hygiene Unremarkable  Behavior Characteristics Cooperative;Anxious  Mood Depressed;Anxious;Pleasant  Thought Process  Coherency WDL  Content WDL  Delusions None reported or observed  Perception WDL  Hallucination None reported or observed  Judgment Impaired  Confusion None  Danger to Self  Current suicidal ideation? Denies  Danger to Others  Danger to Others None reported or observed

## 2023-01-30 NOTE — Group Note (Unsigned)
Date:  01/30/2023 Time:  3:49 PM  Group Topic/Focus:   Goals Group:   The focus of this group is to help patients establish daily goals to achieve during treatment and discuss how the patient can incorporate goal setting into their daily lives to aide in recovery.     Participation Level:  {BHH PARTICIPATION ZOXWR:60454}  Participation Quality:  {BHH PARTICIPATION QUALITY:22265}  Affect:  {BHH AFFECT:22266}  Cognitive:  {BHH COGNITIVE:22267}  Insight: {BHH Insight2:20797}  Engagement in Group:  {BHH ENGAGEMENT IN UJWJX:91478}  Modes of Intervention:  {BHH MODES OF INTERVENTION:22269}  Additional Comments:  ***  Reymundo Poll 01/30/2023, 3:49 PM

## 2023-01-30 NOTE — Plan of Care (Signed)
  Problem: Education: Goal: Emotional status will improve Outcome: Progressing Goal: Mental status will improve Outcome: Progressing   Problem: Activity: Goal: Interest or engagement in activities will improve Outcome: Progressing   Problem: Coping: Goal: Ability to verbalize frustrations and anger appropriately will improve Outcome: Progressing Goal: Ability to demonstrate self-control will improve Outcome: Progressing   Problem: Health Behavior/Discharge Planning: Goal: Identification of resources available to assist in meeting health care needs will improve Outcome: Progressing   Problem: Physical Regulation: Goal: Ability to maintain clinical measurements within normal limits will improve Outcome: Progressing   Problem: Safety: Goal: Periods of time without injury will increase Outcome: Progressing

## 2023-01-31 DIAGNOSIS — F332 Major depressive disorder, recurrent severe without psychotic features: Secondary | ICD-10-CM | POA: Diagnosis not present

## 2023-01-31 DIAGNOSIS — F3162 Bipolar disorder, current episode mixed, moderate: Secondary | ICD-10-CM | POA: Insufficient documentation

## 2023-01-31 DIAGNOSIS — M7989 Other specified soft tissue disorders: Principal | ICD-10-CM

## 2023-01-31 LAB — BASIC METABOLIC PANEL
Anion gap: 13 (ref 5–15)
Anion gap: 12 (ref 5–15)
BUN: 17 mg/dL (ref 6–20)
BUN: 15 mg/dL (ref 6–20)
CO2: 22 mmol/L (ref 22–32)
CO2: 24 mmol/L (ref 22–32)
Calcium: 8.5 mg/dL — ABNORMAL LOW (ref 8.9–10.3)
Calcium: 8.7 mg/dL — ABNORMAL LOW (ref 8.9–10.3)
Chloride: 95 mmol/L — ABNORMAL LOW (ref 98–111)
Chloride: 97 mmol/L — ABNORMAL LOW (ref 98–111)
Creatinine, Ser: 0.73 mg/dL (ref 0.44–1.00)
Creatinine, Ser: 0.77 mg/dL (ref 0.44–1.00)
GFR, Estimated: >60 mL/min (ref >60)
GFR, Estimated: >60 mL/min (ref >60)
Glucose, Bld: 97 mg/dL (ref 70–99)
Glucose, Bld: 112 mg/dL — ABNORMAL HIGH (ref 70–99)
Potassium: 3.5 mmol/L (ref 3.5–5.1)
Potassium: 3.3 mmol/L — ABNORMAL LOW (ref 3.5–5.1)
Sodium: 130 mmol/L — ABNORMAL LOW (ref 135–145)
Sodium: 133 mmol/L — ABNORMAL LOW (ref 135–145)

## 2023-01-31 LAB — LIPID PANEL
Cholesterol: 171 mg/dL (ref 0–200)
HDL: 36 mg/dL — ABNORMAL LOW (ref >40)
LDL Cholesterol: 113 mg/dL — ABNORMAL HIGH (ref 0–99)
Total CHOL/HDL Ratio: 4.8 ratio
Triglycerides: 110 mg/dL (ref <150)
VLDL: 22 mg/dL (ref 0–40)

## 2023-01-31 LAB — HEMOGLOBIN A1C
Hgb A1c MFr Bld: 6.2 % — ABNORMAL HIGH (ref 4.8–5.6)
Mean Plasma Glucose: 131.24 mg/dL

## 2023-01-31 LAB — MAGNESIUM: Magnesium: 2.3 mg/dL (ref 1.7–2.4)

## 2023-01-31 LAB — GLUCOSE, CAPILLARY: Glucose-Capillary: 149 mg/dL — ABNORMAL HIGH (ref 70–99)

## 2023-01-31 LAB — TSH: TSH: 12.639 u[IU]/mL — ABNORMAL HIGH (ref 0.350–4.500)

## 2023-01-31 MED ORDER — MELATONIN 5 MG PO TABS
10.0000 mg | ORAL_TABLET | Freq: Every day | ORAL | Status: DC
  Administered 2023-01-31: 10 mg via ORAL
  Filled 2023-01-31 (×4): qty 2

## 2023-01-31 MED ORDER — TRAZODONE HCL 100 MG PO TABS
200.0000 mg | ORAL_TABLET | Freq: Every day | ORAL | Status: DC
  Administered 2023-01-31 – 2023-02-01 (×2): 200 mg via ORAL
  Filled 2023-01-31 (×4): qty 2

## 2023-01-31 MED ORDER — ARIPIPRAZOLE 2 MG PO TABS
2.0000 mg | ORAL_TABLET | Freq: Once | ORAL | Status: AC
  Administered 2023-01-31: 2 mg via ORAL
  Filled 2023-01-31 (×2): qty 1

## 2023-01-31 MED ORDER — MELATONIN 5 MG PO TABS: 5.0000 mg | ORAL_TABLET | Freq: Every day | ORAL | Status: DC

## 2023-01-31 MED ORDER — TORSEMIDE 20 MG PO TABS
20.0000 mg | ORAL_TABLET | Freq: Once | ORAL | Status: AC
  Administered 2023-01-31: 20 mg via ORAL
  Filled 2023-01-31: qty 1

## 2023-01-31 MED ORDER — POTASSIUM CHLORIDE CRYS ER 20 MEQ PO TBCR
40.0000 meq | EXTENDED_RELEASE_TABLET | ORAL | Status: AC
  Administered 2023-01-31 (×2): 40 meq via ORAL
  Filled 2023-01-31 (×3): qty 2

## 2023-01-31 MED ORDER — MIRTAZAPINE 15 MG PO TABS
15.0000 mg | ORAL_TABLET | Freq: Every day | ORAL | Status: DC
  Administered 2023-01-31: 15 mg via ORAL
  Filled 2023-01-31 (×2): qty 1

## 2023-01-31 MED ORDER — MELATONIN 5 MG PO TABS
5.0000 mg | ORAL_TABLET | Freq: Every day | ORAL | Status: DC
  Administered 2023-01-31 – 2023-02-01 (×2): 5 mg via ORAL
  Filled 2023-01-31 (×5): qty 1

## 2023-01-31 MED ORDER — ARIPIPRAZOLE 5 MG PO TABS
5.0000 mg | ORAL_TABLET | Freq: Every day | ORAL | Status: DC
  Administered 2023-02-01 – 2023-02-02 (×2): 5 mg via ORAL
  Filled 2023-01-31 (×5): qty 1

## 2023-01-31 NOTE — Progress Notes (Signed)
   01/30/23 2116  Psych Admission Type (Psych Patients Only)  Admission Status Involuntary  Psychosocial Assessment  Patient Complaints Anxiety;Depression  Eye Contact Fair  Facial Expression Animated;Anxious  Affect Anxious;Depressed  Speech Logical/coherent  Interaction Assertive  Motor Activity Slow  Appearance/Hygiene Unremarkable  Behavior Characteristics Cooperative;Anxious  Mood Anxious;Pleasant  Thought Process  Coherency WDL  Content WDL  Delusions None reported or observed  Perception WDL  Hallucination None reported or observed  Judgment Impaired  Confusion None  Danger to Self  Current suicidal ideation? Denies  Agreement Not to Harm Self Yes  Description of Agreement verbal  Danger to Others  Danger to Others None reported or observed  Danger to Others Abnormal  Harmful Behavior to others No threats or harm toward other people   Pt is pleasant and cooperative. Pt was offered support and encouragement. Pt was given scheduled medications. Q 15 minute checks were done for safety. Pt attended group and interacts well with peers and staff.  Pt has no complaints.Pt receptive to treatment and safety maintained on unit.

## 2023-01-31 NOTE — Progress Notes (Signed)
Obtained collateral from patient's Cynthia Townsend, at [phone] on 01/31/2023 1:45 PM.   He reports patient has improved, he has spoken to her daily.  Today when speaking to the patient, he believes that she is back at baseline.  Describes her baseline as bubbly and energetic. He misses her and is ready to have her back home. Confirms there are no firearms in the home.   Dr. Liston Alba, MD PGY-2, Psychiatry Residency

## 2023-01-31 NOTE — Progress Notes (Signed)
   01/31/23 0554  15 Minute Checks  Location Bedroom  Visual Appearance Calm  Behavior Composed  Sleep (Behavioral Health Patients Only)  Calculate sleep? (Click Yes once per 24 hr at 0600 safety check) Yes  Documented sleep last 24 hours 5.75

## 2023-01-31 NOTE — Progress Notes (Signed)
Patient ID: Cynthia Townsend, female   DOB: September 11, 1980, 42 y.o.   MRN: [id number]   Pt c/o increased anxiety and requested medication because "My husband is dropping some stuff off, and I know I won't be able to see him." Klonopin 25 mg, PRN given.

## 2023-01-31 NOTE — Progress Notes (Signed)
[name] MD Progress Note  01/31/2023 11:45 AM Cynthia Townsend  MRN:  [id number]  Principal Problem: <principal problem not specified> Diagnosis: Active Problems:   Generalized anxiety disorder   Hypothyroidism   Mild persistent asthma   Hypokalemia   HTN (hypertension)   Acute on chronic diastolic CHF (congestive heart failure) (HCC)   Bipolar 1 disorder, mixed, moderate (HCC)   MDD (major depressive disorder), recurrent severe, without psychosis (HCC)   Reason for Admission:  Cynthia Townsend is a 42 yo female with a reported PPHx of depression, anxiety, ADHD, eating disorders (bulimia, anorexia, binging)and a prior diagnosis of bipolar disorder. She has 1 prior psychiatric hospitalization and reports a history of chronic depression and suicidal ideations. She arrived to [facility] on 01/27/2023 for SI with attempted overdose of prescribed medications in the setting of worsening depression and multiple chronic health conditions. She was admitted to [facility] on 01/28/2023 under IVC. PMHx is significant for HTN, asthma, hypothyroidism s/p thyroidectomy (Feb 2024), CHF, and OSA on CPAP.  (admitted on 01/28/2023, total  LOS: 3 days )  Yesterday, the psychiatry team made following recommendations:  Discontinued Xanax on admission Klonopin 0.5 mg twice daily as needed for anxiety We plan on discontinuing Remeron, will start the process of tapering off Decrease Remeron 30 to 15 mg Continue Zoloft 50 mg for depression, low threshold to d/c if patient starts to become manic Continue Abilify 2 mg for mood stabilization, with plan to titrate up to 5 mg by end of hospitalization Increase trazodone 100 mg to 150 mgnightly for insomnia  Information Obtained Today During Patient Interview:  Patient seen at bedside this morning. Is hyperverbal and distractable. Reports she has a lot of energy and is feeling hopeful. Reports sleep and appetite as "wonderful"  She denies SI, HI, and AVH.  She denies any side effects to  currently prescribed psychiatric medications.  Denies shortness of breath, denies chest pain.  Reports BLE swelling is improving.   Past Psychiatric Hx: Current Psychiatrist: Dr. Tonna Corner at [facility]  Current Therapist: Denies Previous Psych Diagnoses: Anxiety diagnosed in her early 23s, bipolar disorder, depression, ADD Current psychiatric medications: Reports Xanax 0.5 mg 3 times daily for anxiety, mirtazapine 60 mg nightly for depression, and trazodone 100 mg nightly for insomnia Psychiatric medication history: Reports previously trying lithium, Lexapro, Depakote, Celexa and a short time period.  Reports feeling very "crazy" during this time.  Is unable to specify if any of these medications were helpful Reports tremors while on Vraylar Reports Zoloft when she had given birth to her son.  At that time she was on 50 mg, did not find this medication helpful.  Denied any side effects to this medication in the past Psychotherapy hx: Denies but received unspecified therapy sessions on coping skills History of suicide: as described in the HPI History of homicide or aggression: as described in the HPI Psychiatric medication compliance history: Neuromodulation history: None identified on chart review     Substance Abuse Hx: Alcohol: Reports infrequent use.  Tobacco: Quit Oct 2023 "cold [place]". Prior to that she smoked 0.5 ppd for 10 years.  Cannabis: consumes delta 8 gummies once every other week. Reports it helps her relax.  Illicit drugs: No recent illicit substance use.  Rx drug abuse: Used adderall in the past for weight loss Rehab hx: Denies   Past Medical History: PCP: Dr. Wilfred Curtis, MD  Dx: Anemia, HTN, asthma, hypothyroidism s/p thyroidectomy,HLD, migraines, Meds:  Amlodipine (HTN), budesonide-formoterol fumarate (asthma), carvedilol (palpitations in past,  has SOB + edema on this med), metformi, levothyroxine, furosemide (CHF), omeprazole, spironolactone,  semaglutide ALL: penicillins, sulfa, bee venom Hosp: Not formally assessed Surgeries: Thyroidectomy in Feb 2023 after identifying malignancy on biopsy Trauma: No head trauma, concussions, LOC identified on chart review Seizures: None identified on chart review   Reproductive Psychiatric History: Mood shifts around menses: Denies Pregnancy history:  2017: led to post birth pulm edema and eclampsia and post partum hemorrhage  Perinatal/postpartum psychiatric symptoms: Peripartum anxiety and panic attacks Lactating: No Planning to become pregnant: No  LMP: Irregular 2/2 IUD Contraceptives: IUD for last 3 years   Family History: Psych: Mom- alcohol use disorder Substance use family hx: Reports 3 sisters have substance use issues   Social History: Living situation: Lives with ex-fiance, [place], and her two children in [place]. She also has a boxer named Scientist, clinical (histocompatibility and immunogenetics).  Education: completed 2 associates degree Work: has been Engineer, site since 2014 Marital Status: Single Children: Has 2 children, Cynthia Townsend 7 yo, Cynthia Townsend 43yo Abuse: None except as detailed in HPI Legal: Denies Military: No     Past Medical History:  Past Medical History:  Diagnosis Date   ADD (attention deficit disorder)    Anemia    post partum   Anemia requiring transfusions    of iron   Anxiety    Asthma    Blood transfusion without reported diagnosis    Carpal tunnel syndrome during pregnancy    CHF (congestive heart failure) (HCC)    Chicken pox    Classic migraine with aura 09/20/2015   Suzette Battiest disease (radial styloid tenosynovitis)    Depression    Ectopic pregnancy    Insomnia    Laceration of finger    left index with nerve involvement   Pneumonia    Postpartum hemorrhage    Pre-eclampsia in postpartum period    Pregnancy induced hypertension    Pulmonary edema    Recurrent upper respiratory infection (URI)    Sciatic pain    Subclinical hypothyroidism    sub-clinical   Urticaria     dogs   Vitamin D deficiency    Family History:  Family History  Problem Relation Age of Onset   Heart disease Mother        STENT PLACEMENT   Alcohol abuse Mother    Lung cancer Mother    Hyperlipidemia Mother    Mental illness Mother    Hyperthyroidism Mother    COPD Mother    Heart attack Father        STENT PLACEMENT   Hyperlipidemia Father    Heart disease Father    Transient ischemic attack Father    Pulmonary embolism Father    AAA (abdominal aortic aneurysm) Father    Heart disease Sister    Mental illness Sister    Heart attack Sister    Diabetes Sister    Cancer Sister        CERVICAL   Heart attack Sister 17   Rheum arthritis Maternal Grandmother    Heart attack Maternal Grandfather 30   Heart disease Maternal Uncle 40   Colon cancer Paternal Uncle    Current Medications: Current Facility-Administered Medications  Medication Dose Route Frequency Provider Last Rate Last Admin   acetaminophen (TYLENOL) tablet 650 mg  650 mg Oral Q6H PRN Attiah, Nadir, MD       albuterol (VENTOLIN HFA) 108 (90 Base) MCG/ACT inhaler 2 puff  2 puff Inhalation Q6H PRN Sarita Bottom, MD  alum & mag hydroxide-simeth (MAALOX/MYLANTA) 200-200-20 MG/5ML suspension 30 mL  30 mL Oral Q4H PRN Abbott Pao, Nadir, MD       ARIPiprazole (ABILIFY) tablet 2 mg  2 mg Oral Once Lorri Frederick, MD       Melene Muller ON 02/01/2023] ARIPiprazole (ABILIFY) tablet 5 mg  5 mg Oral Daily Carrion-Carrero, Jazsmine Macari, MD       clonazePAM (KLONOPIN) disintegrating tablet 0.25 mg  0.25 mg Oral TID PRN Abbott Pao, Nadir, MD   0.25 mg at 01/31/23 1102   dapagliflozin propanediol (FARXIGA) tablet 10 mg  10 mg Oral QAC breakfast Abbott Pao, Nadir, MD   10 mg at 01/31/23 8119   diphenhydrAMINE (BENADRYL) capsule 50 mg  50 mg Oral TID PRN Sarita Bottom, MD       Or   diphenhydrAMINE (BENADRYL) injection 50 mg  50 mg Intramuscular TID PRN Abbott Pao, Nadir, MD       haloperidol (HALDOL) tablet 5 mg  5 mg Oral TID PRN Abbott Pao,  Nadir, MD       Or   haloperidol lactate (HALDOL) injection 5 mg  5 mg Intramuscular TID PRN Sarita Bottom, MD       levothyroxine (SYNTHROID) tablet 200 mcg  200 mcg Oral Q0600 Abbott Pao, Nadir, MD   200 mcg at 01/31/23 0619   lisinopril (ZESTRIL) tablet 2.5 mg  2.5 mg Oral Daily Abbott Pao, Nadir, MD   2.5 mg at 01/31/23 0929   loratadine (CLARITIN) tablet 10 mg  10 mg Oral Daily Abbott Pao, Nadir, MD   10 mg at 01/31/23 1478   magnesium hydroxide (MILK OF MAGNESIA) suspension 30 mL  30 mL Oral Daily PRN Abbott Pao, Nadir, MD       melatonin tablet 10 mg  10 mg Oral QHS Sindy Guadeloupe, NP   10 mg at 01/31/23 0048   melatonin tablet 5 mg  5 mg Oral QHS Carrion-Carrero, Jaquanda Wickersham, MD       mirtazapine (REMERON) tablet 15 mg  15 mg Oral QHS Carrion-Carrero, Benjerman Molinelli, MD       mometasone-formoterol (DULERA) 200-5 MCG/ACT inhaler 2 puff  2 puff Inhalation BID Gery Pray, MD   2 puff at 01/31/23 0821   pantoprazole (PROTONIX) EC tablet 40 mg  40 mg Oral Daily Attiah, Nadir, MD   40 mg at 01/31/23 0820   polyethylene glycol (MIRALAX / GLYCOLAX) packet 17 g  17 g Oral Daily PRN Lorri Frederick, MD   17 g at 01/31/23 0831   Semaglutide(0.25 or 0.5MG /DOS) Namon Cirri 0.5 mg  0.5 mg Subcutaneous Weekly Abbott Pao, Nadir, MD       spironolactone (ALDACTONE) tablet 25 mg  25 mg Oral Daily Abbott Pao, Nadir, MD   25 mg at 01/31/23 0821   torsemide (DEMADEX) tablet 40 mg  40 mg Oral BID Zigmund Daniel., MD   40 mg at 01/31/23 0820   traZODone (DESYREL) tablet 200 mg  200 mg Oral QHS Carrion-Carrero, Karle Starch, MD        Lab Results:  Results for orders placed or performed during the hospital encounter of 01/28/23 (from the past 48 hour(s))  CBC with Differential     Status: Abnormal   Collection Time: 01/29/23  5:56 PM  Result Value Ref Range   WBC 11.1 (H) 4.0 - 10.5 K/uL   RBC 4.93 3.87 - 5.11 MIL/uL   Hemoglobin 13.2 12.0 - 15.0 g/dL   HCT 29.5 62.1 - 30.8 %   MCV 80.7 80.0 - 100.0 fL   MCH 26.8 26.0 - 34.0 pg  MCHC 33.2 30.0 - 36.0 g/dL   RDW 44.0 34.7 - 42.5 %   Platelets 360 150 - 400 K/uL   nRBC 0.2 0.0 - 0.2 %   Neutrophils Relative % 68 %   Neutro Abs 7.5 1.7 - 7.7 K/uL   Lymphocytes Relative 21 %   Lymphs Abs 2.3 0.7 - 4.0 K/uL   Monocytes Relative 6 %   Monocytes Absolute 0.7 0.1 - 1.0 K/uL   Eosinophils Relative 3 %   Eosinophils Absolute 0.3 0.0 - 0.5 K/uL   Basophils Relative 1 %   Basophils Absolute 0.1 0.0 - 0.1 K/uL   Immature Granulocytes 1 %   Abs Immature Granulocytes 0.16 (H) 0.00 - 0.07 K/uL    Comment: Performed at [facility], [address], [place], [place] [address]  Comprehensive metabolic panel     Status: Abnormal   Collection Time: 01/29/23  5:56 PM  Result Value Ref Range   Sodium 135 135 - 145 mmol/L   Potassium 3.1 (L) 3.5 - 5.1 mmol/L   Chloride 102 98 - 111 mmol/L   CO2 20 (L) 22 - 32 mmol/L   Glucose, Bld 103 (H) 70 - 99 mg/dL    Comment: Glucose reference range applies only to samples taken after fasting for at least 8 hours.   BUN 11 6 - 20 mg/dL   Creatinine, Ser 7.56 0.44 - 1.00 mg/dL   Calcium 8.6 (L) 8.9 - 10.3 mg/dL   Total Protein 7.6 6.5 - 8.1 g/dL   Albumin 3.8 3.5 - 5.0 g/dL   AST 30 15 - 41 U/L   ALT 41 0 - 44 U/L   Alkaline Phosphatase 100 38 - 126 U/L   Total Bilirubin 0.4 0.3 - 1.2 mg/dL   GFR, Estimated >43 >32 mL/min    Comment: (NOTE) Calculated using the CKD-EPI Creatinine Equation (2021)    Anion gap 13 5 - 15    Comment: Performed at [facility], [address] [place], [place] [address]  Brain natriuretic peptide     Status: None   Collection Time: 01/29/23  5:56 PM  Result Value Ref Range   B Natriuretic Peptide 56.6 0.0 - 100.0 pg/mL    Comment: Performed at [facility], [address] [place], [place] [address]  Troponin I (High Sensitivity)     Status: None   Collection Time: 01/29/23  5:56 PM  Result Value Ref Range   Troponin I (High Sensitivity) 4 <18  ng/L    Comment: (NOTE) Elevated high sensitivity troponin I (hsTnI) values and significant  changes across serial measurements may suggest ACS but many other  chronic and acute conditions are known to elevate hsTnI results.  Refer to the "Links" section for chest pain algorithms and additional  guidance. Performed at [facility], [address], [place], [place] [address]   Troponin I (High Sensitivity)     Status: None   Collection Time: 01/29/23  9:28 PM  Result Value Ref Range   Troponin I (High Sensitivity) 4 <18 ng/L    Comment: (NOTE) Elevated high sensitivity troponin I (hsTnI) values and significant  changes across serial measurements may suggest ACS but many other  chronic and acute conditions are known to elevate hsTnI results.  Refer to the "Links" section for chest pain algorithms and additional  guidance. Performed at [facility], [address] [place], [place] [address]   Magnesium     Status:  None   Collection Time: 01/29/23  9:28 PM  Result Value Ref Range   Magnesium 1.8 1.7 - 2.4 mg/dL    Comment: Performed at [facility], [address] [place], [place] [address]  Basic metabolic panel     Status: Abnormal   Collection Time: 01/30/23  6:27 AM  Result Value Ref Range   Sodium 134 (L) 135 - 145 mmol/L   Potassium 3.4 (L) 3.5 - 5.1 mmol/L   Chloride 100 98 - 111 mmol/L   CO2 22 22 - 32 mmol/L   Glucose, Bld 114 (H) 70 - 99 mg/dL    Comment: Glucose reference range applies only to samples taken after fasting for at least 8 hours.   BUN 12 6 - 20 mg/dL   Creatinine, Ser 5.63 0.44 - 1.00 mg/dL   Calcium 8.5 (L) 8.9 - 10.3 mg/dL   GFR, Estimated >87 >56 mL/min    Comment: (NOTE) Calculated using the CKD-EPI Creatinine Equation (2021)    Anion gap 12 5 - 15    Comment: Performed at [facility], [address] [place], [place] [address]  Urinalysis, Routine w reflex microscopic  -Urine, Clean Catch     Status: Abnormal   Collection Time: 01/30/23  4:44 PM  Result Value Ref Range   Color, Urine STRAW (A) YELLOW   APPearance CLEAR CLEAR   Specific Gravity, Urine 1.011 1.005 - 1.030   pH 5.0 5.0 - 8.0   Glucose, UA >=500 (A) NEGATIVE mg/dL   Hgb urine dipstick NEGATIVE NEGATIVE   Bilirubin Urine NEGATIVE NEGATIVE   Ketones, ur NEGATIVE NEGATIVE mg/dL   Protein, ur NEGATIVE NEGATIVE mg/dL   Nitrite NEGATIVE NEGATIVE   Leukocytes,Ua NEGATIVE NEGATIVE   RBC / HPF 0-5 0 - 5 RBC/hpf   WBC, UA 0-5 0 - 5 WBC/hpf   Bacteria, UA RARE (A) NONE SEEN   Squamous Epithelial / HPF 0-5 0 - 5 /HPF    Comment: Performed at [facility], [address], [place], [place] [address]  Basic metabolic panel     Status: Abnormal   Collection Time: 01/31/23  6:18 AM  Result Value Ref Range   Sodium 133 (L) 135 - 145 mmol/L   Potassium 3.3 (L) 3.5 - 5.1 mmol/L   Chloride 97 (L) 98 - 111 mmol/L   CO2 24 22 - 32 mmol/L   Glucose, Bld 112 (H) 70 - 99 mg/dL    Comment: Glucose reference range applies only to samples taken after fasting for at least 8 hours.   BUN 15 6 - 20 mg/dL   Creatinine, Ser 1.66 0.44 - 1.00 mg/dL   Calcium 8.7 (L) 8.9 - 10.3 mg/dL   GFR, Estimated >06 >30 mL/min    Comment: (NOTE) Calculated using the CKD-EPI Creatinine Equation (2021)    Anion gap 12 5 - 15    Comment: Performed at [facility], [address], [place], [place] [address]  Hemoglobin A1c     Status: Abnormal   Collection Time: 01/31/23  6:18 AM  Result Value Ref Range   Hgb A1c MFr Bld 6.2 (H) 4.8 - 5.6 %    Comment: (NOTE) Pre diabetes:          5.7%-6.4%  Diabetes:              >6.4%  Glycemic control for   <7.0% adults with diabetes    Mean Plasma Glucose 131.24 mg/dL    Comment:  Performed at [facility], [address] [place], [place] [address]  Lipid panel     Status: Abnormal   Collection Time: 01/31/23  6:18 AM  Result Value Ref  Range   Cholesterol 171 0 - 200 mg/dL   Triglycerides 914 <782 mg/dL   HDL 36 (L) >95 mg/dL   Total CHOL/HDL Ratio 4.8 RATIO   VLDL 22 0 - 40 mg/dL   LDL Cholesterol 621 (H) 0 - 99 mg/dL    Comment:        Total Cholesterol/HDL:CHD Risk Coronary Heart Disease Risk Table                     Men   Women  1/2 Average Risk   3.4   3.3  Average Risk       5.0   4.4  2 X Average Risk   9.6   7.1  3 X Average Risk  23.4   11.0        Use the calculated Patient Ratio above and the CHD Risk Table to determine the patient's CHD Risk.        ATP III CLASSIFICATION (LDL):  <100     mg/dL   Optimal  308-657  mg/dL   Near or Above                    Optimal  130-159  mg/dL   Borderline  846-962  mg/dL   High  >952     mg/dL   Very High Performed at [facility], [address] [place], [place] [address]     Blood Alcohol level:  Lab Results  Component Value Date   ETH <10 01/27/2023    Metabolic Labs: Lab Results  Component Value Date   HGBA1C 6.2 (H) 01/31/2023   MPG 131.24 01/31/2023   No results found for: "PROLACTIN" Lab Results  Component Value Date   CHOL 171 01/31/2023   TRIG 110 01/31/2023   HDL 36 (L) 01/31/2023   CHOLHDL 4.8 01/31/2023   VLDL 22 01/31/2023   LDLCALC 113 (H) 01/31/2023    Sleep:Sleep: Fair   Physical Findings: AIMS: No  CIWA:    COWS:     Psychiatric Specialty Exam:   Presentation  General Appearance: Appropriate for Environment; Casual; Fairly Groomed  Eye Contact:Fair  Speech:Clear and Coherent (Hyperverbal)  Speech Volume:Normal  Handedness:-- (Not assessed)   Mood and Affect  Mood:-- (Really helpful, lots of energy)  Affect:Appropriate; Full Range; Congruent   Thought Process  Thought Processes:Coherent; Goal Directed; Linear  Descriptions of Associations:Tangential  Orientation:-- (Not formally assessed)  Thought Content:Logical; WDL  History of Schizophrenia/Schizoaffective disorder:No  data recorded Duration of Psychotic Symptoms:No data recorded Hallucinations:Hallucinations: None  Ideas of Reference:None  Suicidal Thoughts:Suicidal Thoughts: No SI Passive Intent and/or Plan: Without Intent; Without Plan  Homicidal Thoughts:Homicidal Thoughts: No   Sensorium  Memory:Immediate Fair  Judgment:Fair  Insight:Fair   Executive Functions  Concentration:Poor  Attention Span:Fair  Recall:Fair  Fund of Knowledge:Fair  Language:Fair   Psychomotor Activity  Psychomotor Activity:Psychomotor Activity: Normal   Assets  Assets:Communication Skills; Desire for Improvement; Resilience   Sleep  Sleep:Sleep: Fair    Physical Exam: Physical Exam Review of Systems  Constitutional:  Negative for chills and fever.  Respiratory:  Positive for shortness of breath.   Cardiovascular:  Positive for leg swelling.   Blood pressure 110/78, pulse 90, temperature 98.6 F (37 C), temperature source Oral, resp. rate  18, height 5\' 2"  (1.575 m), weight 114.9 kg, SpO2 98%. Body mass index is 46.35 kg/m.  Treatment Plan Summary: Daily contact with patient to assess and evaluate symptoms and progress in treatment and Medication management   ASSESSMENT: Avenleigh Unnerstall is a 42 yo female with a reported PPHx of depression, anxiety, ADHD, eating disorders (bulimia, anorexia, binging)and a prior diagnosis of bipolar disorder. She has 1 prior psychiatric hospitalization and reports a history of chronic depression and suicidal ideations. She arrived to [facility] on 01/27/2023 for SI with attempted overdose of prescribed medications in the setting of worsening depression and multiple chronic health conditions. She was admitted to [facility] on 01/28/2023 under IVC. PMHx is significant for HTN, asthma, hypothyroidism s/p thyroidectomy (Feb 2024), CHF, and OSA on CPAP   8/21: Transferred to [facility] for concerns of acute on chronic CHF exacerbation.  8/23: Patient presenting hypomanic today,  hyperverbal, distractible.  We will continue to monitor, Zoloft will be discontinued.  Patient's current presentation meets criteria for a diagnosis of bipolar 1 disorder.   Diagnoses / Active Problems: Bipolar 1 disorder, current episode mixed GAD with panic attacks PTSD Binge eating disorder Tobacco use disorder, in sustained remission Stimulant use disorder (adderall), in sustained remission R/o kleptomania Rule out borderline personality disorder  PLAN: Safety and Monitoring:  -- INVOLUNTARY admission to inpatient psychiatric unit for safety, stabilization and treatment  -- Daily contact with patient to assess and evaluate symptoms and progress in treatment  -- Patient's case to be discussed in multi-disciplinary team meeting  -- Observation Level : q15 minute checks  -- Vital signs:  q12 hours  -- Precautions: suicide, elopement, and assault  2. Psychiatric Diagnoses and Treatment:   Discontinue Xanax on admission Discontinue Zoloft, concern for worsening mania Continue Klonopin 0.5 mg twice daily as needed for anxiety We plan on discontinuing Remeron, currently tapering off Decrease Remeron 30 to 15 mg Increase Abilify 2 mg to 5 mg starting tomorrow.  Patient will receive additional 2 mg today, for total of Abilify 4 mg. Increase trazodone 100 mg to 150 mgnightly for insomnia -- The risks/benefits/side-effects/alternatives to this medication were discussed in detail with the patient and time was given for questions. The patient consents to medication trial.              -- Metabolic profile and EKG monitoring obtained while on an atypical antipsychotic  BMI: 47.26 kg/m2 TSH: pending Lipid Panel: pending HbgA1c: pending QTc: 461 on 01/29/2023             -- Encouraged patient to participate in unit milieu and in scheduled group therapies   -- Short Term Goals: Ability to identify changes in lifestyle to reduce recurrence of condition will improve and Ability to verbalize  feelings will improve  -- Long Term Goals: Improvement in symptoms so as ready for discharge    3. Medical Issues Being Addressed:  Dr. Lowell Guitar, MD from [facility] following. Appreciate his recommendations.   #HFpEF (LVEF 60-65% 11/2022) Low-salt diet Daily weights Torsemide 40 mg twice daily Spironolactone 25 mg daily UA- bacteria +, glucose >500  #HTN Lisinopril Holding Coreg and Norvasc  #Hypokalemia K+ 3.4 on 01/30/2023 BMP today 3.3 Continue spironolactone  #Mild persistent asthma Symbicort  Zyrtec MDI PRN  #Hypothyroidism TSH pending  Synthroid 200 mcg daily  #OSA CPAP  #Morbid Obesity #T2DM, controlled (A1C 4.9%) Semaglutide for weight loss   4. Discharge Planning:   -- Social work and case management to assist with discharge planning and  identification of hospital follow-up needs prior to discharge  -- Estimated LOS: 8/26/8/27  -- Discharge Concerns: Need to establish a safety plan; Medication compliance and effectiveness  -- Discharge Goals: Return home with outpatient referrals for mental health follow-up including medication management/psychotherapy   I certify that inpatient services furnished can reasonably be expected to improve the patient's condition.   This note was created using a voice recognition software as a result there may be grammatical errors inadvertently enclosed that do not reflect the nature of this encounter. Every attempt is made to correct such errors.   Dr. Liston Alba, MD PGY-2, Psychiatry Residency  8/23/202411:45 AM Patient evaluated on the unit, reports she feels "hopeful and energized" today

## 2023-01-31 NOTE — Progress Notes (Signed)
   01/31/23 2144  Psych Admission Type (Psych Patients Only)  Admission Status Involuntary  Psychosocial Assessment  Patient Complaints Anxiety  Eye Contact Fair  Facial Expression Animated  Affect Anxious  Speech Logical/coherent  Interaction Assertive  Motor Activity Other (Comment) (WDL)  Appearance/Hygiene Unremarkable  Behavior Characteristics Cooperative;Appropriate to situation  Mood Euthymic  Thought Process  Coherency WDL  Content WDL  Delusions None reported or observed  Perception WDL  Hallucination None reported or observed  Judgment WDL  Confusion None  Danger to Self  Current suicidal ideation? Denies  Agreement Not to Harm Self Yes  Description of Agreement verbal  Danger to Others  Danger to Others None reported or observed

## 2023-01-31 NOTE — Plan of Care (Signed)
  Problem: Education: Goal: Emotional status will improve Outcome: Progressing Goal: Mental status will improve Outcome: Progressing   Problem: Activity: Goal: Interest or engagement in activities will improve Outcome: Progressing   Problem: Coping: Goal: Ability to demonstrate self-control will improve Outcome: Progressing   

## 2023-01-31 NOTE — Plan of Care (Signed)
°  Problem: Education: Goal: Emotional status will improve Outcome: Progressing Goal: Mental status will improve Outcome: Progressing   Problem: Activity: Goal: Interest or engagement in activities will improve Outcome: Progressing   Problem: Coping: Goal: Ability to verbalize frustrations and anger appropriately will improve Outcome: Progressing   Problem: Safety: Goal: Periods of time without injury will increase Outcome: Progressing

## 2023-01-31 NOTE — Group Note (Signed)
Recreation Therapy Group Note   Group Topic:Relaxation  Group Date: 01/31/2023 Start Time: 1610 End Time: 1015 Facilitators: Arath Kaigler-McCall, LRT,CTRS Location: [address] Dayroom   Goal Area(s) Addresses:  Patient will identify positive stress management techniques. Patient will identify benefits of using stress management post d/c.  Group Description: Meditation.  LRT and patients discussed the importance of taking time to release stress and clear the mind. LRT played a meditation that walked patients through breathing techniques and ways to refocus on positive feelings and mindset.    Affect/Mood: N/A   Participation Level: Did not attend    Clinical Observations/Individualized Feedback:     Plan: Continue to engage patient in RT group sessions 2-3x/week.   Yunis Voorheis-McCall, LRT,CTRS 01/31/2023 11:45 AM

## 2023-01-31 NOTE — Consult Note (Signed)
PROGRESS NOTE    Cynthia [facility]  ZOX:[id number] DOB: 1981/01/27 DOA: 01/28/2023 PCP: Wilfred Curtis, MD  Chief Complaint  Patient presents with   Leg Swelling    Brief Narrative:    42 year old female with past medical history of ADD, depression, OSA on CPAP, HFpEF, HTN, hypothyroidism, RLS, and T2DM.  Patient admitted to [facility] 01/29/2023 after Amanuel Sinkfield suicide attempt.  She is IVCed.  Patient attempted to OD on Norvasc, Coreg, Xanax.  These medications have been on hold since [facility] admission. Patient endorses increasing bilateral LE pitting edema since [facility] admission despite being maintained on  lasix 40 mg BID and spironolactone 25 mg QD.  These medications have not been interrupted.  Despite this she complains of shortness of breath and DOE.  She was sent to the ER.  Patient has been given her scheduled 40 mg p.o. Lasix and additional 40 mg p.o. prior to being sent to the ER.   Assessment & Plan:   Active Problems:   Generalized anxiety disorder   Hypothyroidism   Mild persistent asthma   Hypokalemia   HTN (hypertension)   Acute on chronic diastolic CHF (congestive heart failure) (HCC)   Bipolar 1 disorder, mixed, moderate (HCC)   MDD (major depressive disorder), recurrent severe, without psychosis (HCC)  Heart Failure with preserved Ejection Fraction with Exacerbation Shortness of breath with exertion  Lower Extremity Swelling - reportedly compliant with low salt diet, meds (coreg, norvasc were held on admission) - SOB with exertion unchanged today, LE edema seems mildly improved - weight down today (unclear how accurate these are) - she c/o LH, cramping -> maybe getting close to limits with diuresis - hold PM torsemide until orthostatics done, if normal, consider dosing torsemide - continue spironolactone - echo 11/2022 with EF 60-65%, no RWMA - UA without proteinuria  LFT's wnl 8/21. - daily weights  Wt Readings from Last 3 Encounters:  01/31/23 114.9 kg  01/29/23 120.3 kg  01/27/23  117.9 kg   Lightheadedness Started after prolonged standing - possibly related to diuresis? Check orthostatics Consider adjustment in BP meds, jardiance, diuresis based on results  Hypokalemia Replace and follow   Cramping Hasn't received potassium yet today, likely related to diuresis   Mild persistent asthma -Restart Symbicort, continue Zyrtec -MDI as needed ordered. No wheezing on exam    Hypothyroidism -Synthroid resumed -follow TSH    HTN -lisinopril -coreg and norvasc on hold     OSA -Patient on CPAP    Morbid obesity -On Ozempic for weight loss.  Hemoglobin A1c 4.9    Generalized anxiety disorder and ADD //suicide attempt -Per [facility]    Family Communication: none Disposition: per psychiatry who is primary  Procedures:  none  Antimicrobials:  Anti-infectives (From admission, onward)    None       Subjective: Noted lightheadedness today  Objective: Vitals:   01/31/23 0500 01/31/23 0611 01/31/23 0613 01/31/23 0925  BP:  112/62 (!) 101/53 110/78  Pulse:  90 86 90  Resp:  18    Temp:  98.6 F (37 C)    TempSrc:  Oral    SpO2:  97%  98%  Weight: 114.9 kg     Height:       No intake or output data in the 24 hours ending 01/31/23 1413 Filed Weights   01/28/23 1550 01/29/23 1804 01/31/23 0500  Weight: 117.2 kg 120.3 kg 114.9 kg    Examination:  General: No acute distress. Cardiovascular: Heart sounds show Dillan Candela regular rate, and rhythm.  Lungs: Clear to auscultation bilaterally Neurological: Alert and oriented 3. Moves all extremities 4 with equal strength. Cranial nerves II through XII grossly intact. Extremities: continued bilateral LE pitting edema    Data Reviewed: I have personally reviewed following labs and imaging studies  CBC: Recent Labs  Lab 01/27/23 1544 01/29/23 1756  WBC 10.3 11.1*  NEUTROABS  --  7.5  HGB 12.4 13.2  HCT 37.5 39.8  MCV 81.2 80.7  PLT 380 360    Basic Metabolic Panel: Recent Labs  Lab  01/27/23 1544 01/29/23 1756 01/29/23 2128 01/30/23 0627 01/31/23 0618  NA 135 135  --  134* 133*  K 3.2* 3.1*  --  3.4* 3.3*  CL 107 102  --  100 97*  CO2 20* 20*  --  22 24  GLUCOSE 98 103*  --  114* 112*  BUN 22* 11  --  12 15  CREATININE 0.70 0.54  --  0.67 0.77  CALCIUM 8.2* 8.6*  --  8.5* 8.7*  MG 2.1  --  1.8  --   --     GFR: Estimated Creatinine Clearance: 109.9 mL/min (by C-G formula based on SCr of 0.77 mg/dL).  Liver Function Tests: Recent Labs  Lab 01/27/23 1544 01/29/23 1756  AST 27 30  ALT 48* 41  ALKPHOS 96 100  BILITOT 0.4 0.4  PROT 6.9 7.6  ALBUMIN 3.5 3.8    CBG: Recent Labs  Lab 01/27/23 1537  GLUCAP 95     No results found for this or any previous visit (from the past 240 hour(s)).       Radiology Studies: DG Chest Port 1 View  Result Date: 01/29/2023 CLINICAL DATA:  Shortness of breath EXAM: PORTABLE CHEST 1 VIEW COMPARISON:  Radiograph 01/27/2023 FINDINGS: Cardiomegaly. No focal consolidation, pleural effusion, or pneumothorax. No displaced rib fractures. IMPRESSION: No acute cardiopulmonary process.  Cardiomegaly. Electronically Signed   By: Minerva Fester M.D.   On: 01/29/2023 19:29        Scheduled Meds:  [START ON 02/01/2023] ARIPiprazole  5 mg Oral Daily   dapagliflozin propanediol  10 mg Oral QAC breakfast   levothyroxine  200 mcg Oral Q0600   lisinopril  2.5 mg Oral Daily   loratadine  10 mg Oral Daily   melatonin  5 mg Oral QHS   mirtazapine  15 mg Oral QHS   mometasone-formoterol  2 puff Inhalation BID   pantoprazole  40 mg Oral Daily   potassium chloride  40 mEq Oral Q4H   Semaglutide(0.25 or 0.5MG /DOS)  0.5 mg Subcutaneous Weekly   spironolactone  25 mg Oral Daily   traZODone  200 mg Oral QHS   Continuous Infusions:   LOS: 3 days    Time spent: over 30 min    Lacretia Nicks, MD [facility]   To contact the attending provider between 7A-7P or the covering provider during after hours 7P-7A,  please log into the web site [email or web address] and access using universal [place] password for that web site. If you do not have the password, please call the hospital operator.  01/31/2023, 2:13 PM

## 2023-01-31 NOTE — [facility] Group Notes (Signed)
[facility] Group Notes:  (Nursing/MHT/Case Management/Adjunct)  Date:  01/31/2023  Time:  9:15 PM  Type of Therapy:   Wrap-up group  Participation Level:  Active  Participation Quality:  Appropriate  Affect:  Appropriate  Cognitive:  Appropriate  Insight:  Appropriate  Engagement in Group:  Engaged  Modes of Intervention:  Education  Summary of Progress/Problems: Pt goal to get medications stable. Rated day 8/10.   Cynthia Townsend 01/31/2023, 9:15 PM

## 2023-01-31 NOTE — [facility] Group Notes (Signed)
The focus of this group is to help patients establish daily goals to achieve during treatment and discuss how the patient can incorporate goal setting into their daily lives to aide in recovery.     Scale 1-10   8 out of 10       Goal   Make sure I'm comfortable with my medication

## 2023-01-31 NOTE — Progress Notes (Signed)
   01/31/23 1000  Psych Admission Type (Psych Patients Only)  Admission Status Involuntary  Psychosocial Assessment  Patient Complaints Anxiety  Eye Contact Fair  Facial Expression Animated  Affect Anxious  Speech Incoherent  Interaction Assertive  Motor Activity Other (Comment) (WNL)  Appearance/Hygiene Unremarkable  Behavior Characteristics Cooperative;Appropriate to situation  Mood Euthymic  Thought Process  Coherency WDL  Content WDL  Delusions None reported or observed  Perception WDL  Hallucination None reported or observed  Judgment WDL  Confusion None  Danger to Self  Current suicidal ideation? Denies  Agreement Not to Harm Self Yes  Description of Agreement verbally contracts for safety  Danger to Others  Danger to Others None reported or observed  Danger to Others Abnormal  Harmful Behavior to others No threats or harm toward other people  Destructive Behavior No threats or harm toward property

## 2023-01-31 NOTE — Plan of Care (Signed)
  Problem: Coping: Goal: Ability to verbalize frustrations and anger appropriately will improve Outcome: Progressing   Problem: Coping: Goal: Ability to demonstrate self-control will improve Outcome: Progressing   Problem: Health Behavior/Discharge Planning: Goal: Compliance with treatment plan for underlying cause of condition will improve Outcome: Progressing   

## 2023-02-01 DIAGNOSIS — M7989 Other specified soft tissue disorders: Secondary | ICD-10-CM

## 2023-02-01 DIAGNOSIS — F332 Major depressive disorder, recurrent severe without psychotic features: Secondary | ICD-10-CM | POA: Diagnosis not present

## 2023-02-01 LAB — BASIC METABOLIC PANEL
Anion gap: 12 (ref 5–15)
BUN: 17 mg/dL (ref 6–20)
CO2: 23 mmol/L (ref 22–32)
Calcium: 8.2 mg/dL — ABNORMAL LOW (ref 8.9–10.3)
Chloride: 98 mmol/L (ref 98–111)
Creatinine, Ser: 0.75 mg/dL (ref 0.44–1.00)
GFR, Estimated: >60 mL/min (ref >60)
Glucose, Bld: 122 mg/dL — ABNORMAL HIGH (ref 70–99)
Potassium: 3.2 mmol/L — ABNORMAL LOW (ref 3.5–5.1)
Sodium: 133 mmol/L — ABNORMAL LOW (ref 135–145)

## 2023-02-01 LAB — MAGNESIUM: Magnesium: 2.2 mg/dL (ref 1.7–2.4)

## 2023-02-01 MED ORDER — TORSEMIDE 20 MG PO TABS
40.0000 mg | ORAL_TABLET | Freq: Once | ORAL | Status: AC
  Administered 2023-02-01: 40 mg via ORAL
  Filled 2023-02-01 (×2): qty 2

## 2023-02-01 MED ORDER — LEVOTHYROXINE SODIUM 75 MCG PO TABS: 225.0000 ug | ORAL_TABLET | Freq: Every day | ORAL | 0 refills | Status: DC

## 2023-02-01 MED ORDER — FUROSEMIDE 40 MG PO TABS
40.0000 mg | ORAL_TABLET | Freq: Two times a day (BID) | ORAL | Status: DC
  Administered 2023-02-02: 40 mg via ORAL
  Filled 2023-02-01 (×4): qty 1
  Filled 2023-02-01: qty 2
  Filled 2023-02-01: qty 1

## 2023-02-01 MED ORDER — POTASSIUM CHLORIDE CRYS ER 20 MEQ PO TBCR: 20.0000 meq | EXTENDED_RELEASE_TABLET | Freq: Two times a day (BID) | ORAL | 0 refills | Status: AC

## 2023-02-01 MED ORDER — TORSEMIDE 20 MG PO TABS
40.0000 mg | ORAL_TABLET | Freq: Once | ORAL | Status: AC
  Administered 2023-02-01: 40 mg via ORAL
  Filled 2023-02-01: qty 2

## 2023-02-01 MED ORDER — LEVOTHYROXINE SODIUM 25 MCG PO TABS
225.0000 ug | ORAL_TABLET | Freq: Every day | ORAL | Status: DC
  Administered 2023-02-02: 225 ug via ORAL
  Filled 2023-02-01: qty 2
  Filled 2023-02-01 (×4): qty 1

## 2023-02-01 MED ORDER — POTASSIUM CHLORIDE CRYS ER 20 MEQ PO TBCR
40.0000 meq | EXTENDED_RELEASE_TABLET | ORAL | Status: AC
  Administered 2023-02-01 (×3): 40 meq via ORAL
  Filled 2023-02-01 (×3): qty 2

## 2023-02-01 MED ORDER — FUROSEMIDE 40 MG PO TABS: 40.0000 mg | ORAL_TABLET | Freq: Two times a day (BID) | ORAL | 0 refills | Status: AC

## 2023-02-01 NOTE — [facility] Group Notes (Signed)
[facility] Group Notes:  (Nursing)  Date:  02/01/2023  Time:  7:01 PM  Type of Therapy:  Group Therapy  Participation Level:  Active  Participation Quality:  Attentive  Affect:  Appropriate  Cognitive:  Alert and Appropriate  Insight:  Appropriate and Good  Engagement in Group:  Engaged  Modes of Intervention:  Activity, Exploration, Rapport Building, and Socialization  Summary of Progress/Problems: The focus of this group is helping patients create a visual composition (collage) representing their thoughts emotions and experiences. Selecting and arranging these elements allow individuals to tap into their subconscious and express their emotions in a non verbal and symbolic way.   Shela Nevin 02/01/2023, 7:01 PM

## 2023-02-01 NOTE — Progress Notes (Signed)
   02/01/23 0531  15 Minute Checks  Location Bedroom  Visual Appearance Calm  Behavior Composed  Sleep (Behavioral Health Patients Only)  Calculate sleep? (Click Yes once per 24 hr at 0600 safety check) Yes  Documented sleep last 24 hours 5

## 2023-02-01 NOTE — Plan of Care (Signed)
°  Problem: Education: °Goal: Emotional status will improve °Outcome: Progressing °Goal: Mental status will improve °Outcome: Progressing °Goal: Verbalization of understanding the information provided will improve °Outcome: Progressing °  °

## 2023-02-01 NOTE — Consult Note (Addendum)
Consult NOTE    Cynthia Townsend  VHQ:[id number] DOB: [birth year] DOA: 01/28/2023 PCP: Wilfred Curtis, MD  Chief Complaint  Patient presents with   Leg Swelling    Brief Narrative:    42 year old female with past medical history of ADD, depression, OSA on CPAP, HFpEF, HTN, hypothyroidism, RLS, and T2DM.  Patient admitted to [facility] 01/29/2023 after [facility] suicide attempt.  She is IVCed.  Patient attempted to OD on Norvasc, Coreg, Xanax.  These medications have been on hold since Cyn[facility] admission. Patient endorses increasing bilateral LE pitting edema since [facility] admission despite being maintained on  lasix 40 mg BID and spironolactone 25 mg QD.  These medications have not been interrupted.  Despite this she complains of shortness of breath and DOE.  She was sent to the ER.  Patient has been given her scheduled 40 mg p.o. Lasix and additional 40 mg p.o. prior to being sent to the ER.   We've been consulting for volume overload while she's been admitted to [facility].  Remains stable for discharge at this time.    Assessment & Plan:   Active Problems:   Generalized anxiety disorder   Hypothyroidism   Mild persistent asthma   Hypokalemia   HTN (hypertension)   Acute on chronic diastolic CHF (congestive heart failure) (HCC)   Bipolar 1 disorder, mixed, moderate (HCC)   MDD (major depressive disorder), recurrent severe, without psychosis (HCC)   Leg swelling  Discharge recommendations Resume home lasix 40 mg twice [name] day at discharge, continue low sodium <2 gm/day diet.  Take 20 meq kcl with lasix. She should be instructed to weigh herself daily, if weights go up 2-3 lbs in Cynthia Townsend day or 5 lbs in Cynthia [facility]send week, take an extra dose of lasix for 3 days and call cardiology for further instructions Ok from my standpoint to resume amlodipine and coreg at discharge if ok with psych  She should call PCP and primary cardiologist for follow up appointments Synthroid increased, needs repeat labs in 4-6 weeks I prescribed lasix 40 mg BID,  klor con 20 meq BID, and synthroid 225 mcg    -------------------------------------------------------------------------------------------------   Heart Failure with preserved Ejection Fraction with Exacerbation Shortness of breath with exertion  Lower Extremity Swelling - suspect Klare Criss result of IV fluids in ED and no diuretic until 8/21  - reportedly compliant with low salt diet, meds (coreg, norvasc were held on admission) - SOB with exertion unchanged today, LE edema seems mildly improved - weight down today (unclear how accurate these are) - she c/o LH, cramping -> maybe getting close to limits with diuresis - continue torsemide today, at discharge, resume home lasix tomorrow (she notes her prescription at home is for 40 mg twice [name] day) - continue spironolactone - echo 11/2022 with EF 60-65%, no RWMA - UA without proteinuria  LFT's wnl 8/21. - daily weights (weight 11/2022 was 116.8, currently weight is lower than that)  Wt Readings from Last 3 Encounters:  02/01/23 114.9 kg  01/29/23 120.3 kg  01/27/23 117.9 kg   Lightheadedness Started after prolonged standing - possibly related to diuresis? Check orthostatics -> negative Consider adjustment in BP meds, jardiance, diuresis based on results  Hypokalemia Replace and follow   Cramping Hasn't received potassium yet today, likely related to diuresis   Mild persistent asthma -Restart Symbicort, continue Zyrtec -MDI as needed ordered. No wheezing on exam    Hypothyroidism -Synthroid resumed - TSH suggestive of underdosing of synthroid, will increase to 225 micrograms and recommend repeat  labs in 4-6 weeks with endocrinology    HTN -lisinopril -coreg and norvasc on hold - ok to resume at discharge from my standpoint     OSA -Patient on CPAP    Morbid obesity -On Ozempic for weight loss.  Hemoglobin A1c 4.9    Generalized anxiety disorder and ADD //suicide attempt -Per [facility]    Family Communication: none Disposition:  per psychiatry who is primary  Procedures:  none  Antimicrobials:  Anti-infectives (From admission, onward)    None       Subjective: Feels ok today, lightheadedness is improved SOB with exertion unchanged  Objective: Vitals:   02/01/23 0550 02/01/23 0554 02/01/23 0558 02/01/23 0559  BP:  125/73 130/83   Pulse:  84 87 86  Resp:  18    Temp: 98.4 F (36.9 C)     TempSrc: Oral     SpO2:    99%  Weight:      Height:        Intake/Output Summary (Last 24 hours) at 02/01/2023 1627 Last data filed at 02/01/2023 1600 Gross per 24 hour  Intake 2220 ml  Output 2650 ml  Net -430 ml   Filed Weights   01/29/23 1804 01/31/23 0500 02/01/23 0500  Weight: 120.3 kg 114.9 kg 114.9 kg    Examination:  General: No acute distress. Cardiovascular: Heart sounds show Cynthia [facility]send regular rate, and rhythm.  Lungs: Clear to auscultation bilaterally Abdomen: Soft, nontender, nondistended Neurological: Alert and oriented 3. Moves all extremities 4 with equal strength. Cranial nerves II through XII grossly intact. Extremities: continued pitting edema, seems to be gradually improving    Data Reviewed: I have personally reviewed following labs and imaging studies  CBC: Recent Labs  Lab 01/27/23 1544 01/29/23 1756  WBC 10.3 11.1*  NEUTROABS  --  7.5  HGB 12.4 13.2  HCT 37.5 39.8  MCV 81.2 80.7  PLT 380 360    Basic Metabolic Panel: Recent Labs  Lab 01/27/23 1544 01/29/23 1756 01/29/23 2128 01/30/23 0627 01/30/23 1929 01/31/23 0618 01/31/23 1814 02/01/23 0635  NA 135 135  --  134* 130* 133*  --  133*  K 3.2* 3.1*  --  3.4* 3.5 3.3*  --  3.2*  CL 107 102  --  100 95* 97*  --  98  CO2 20* 20*  --  22 22 24   --  23  GLUCOSE 98 103*  --  114* 97 112*  --  122*  BUN 22* 11  --  12 17 15   --  17  CREATININE 0.70 0.54  --  0.67 0.73 0.77  --  0.75  CALCIUM 8.2* 8.6*  --  8.5* 8.5* 8.7*  --  8.2*  MG 2.1  --  1.8  --   --   --  2.3 2.2    GFR: Estimated Creatinine Clearance:  109.9 mL/min (by C-G formula based on SCr of 0.75 mg/dL).  Liver Function Tests: Recent Labs  Lab 01/27/23 1544 01/29/23 1756  AST 27 30  ALT 48* 41  ALKPHOS 96 100  BILITOT 0.4 0.4  PROT 6.9 7.6  ALBUMIN 3.5 3.8    CBG: Recent Labs  Lab 01/27/23 1537 01/31/23 1457  GLUCAP 95 149*     No results found for this or any previous visit (from the past 240 hour(s)).       Radiology Studies: No results found.      Scheduled Meds:  ARIPiprazole  5 mg Oral Daily   dapagliflozin  propanediol  10 mg Oral QAC breakfast   levothyroxine  200 mcg Oral Q0600   lisinopril  2.5 mg Oral Daily   loratadine  10 mg Oral Daily   melatonin  5 mg Oral QHS   mometasone-formoterol  2 puff Inhalation BID   pantoprazole  40 mg Oral Daily   Semaglutide(0.25 or 0.5MG /DOS)  0.5 mg Subcutaneous Weekly   spironolactone  25 mg Oral Daily   torsemide  40 mg Oral Once   traZODone  200 mg Oral QHS   Continuous Infusions:   LOS: 4 days    Time spent: over 30 min    Lacretia Nicks, MD [facility]   To contact the attending provider between 7A-7P or the covering provider during after hours 7P-7A, please log into the web site [email or web address] and access using universal [place] password for that web site. If you do not have the password, please call the [facility] operator.  02/01/2023, 4:27 PM

## 2023-02-01 NOTE — [facility] Group Notes (Signed)
LCSW Wellness Group Note   02/01/2023 1:00pm  Type of Group and Topic: Psychoeducational Group:  Wellness  Participation Level:  Active  Description of Group  Wellness group introduces the topic and its focus on developing healthy habits across the spectrum and its relationship to a decrease in hospital admissions.  Six areas of wellness are discussed: physical, social spiritual, intellectual, occupational, and emotional.  Patients are asked to consider their current wellness habits and to identify areas of wellness where they are interested and able to focus on improvements.    Therapeutic Goals Patients will understand components of wellness and how they can positively impact overall health.  Patients will identify areas of wellness where they have developed good habits. Patients will identify areas of wellness where they would like to make improvements.    Summary of Patient Progress:  pt active in group discussion and attentive throughout.  Pt identified spritual and social as wellness areas of strength and identified emotional wellness as an area that needs improvement.       Therapeutic Modalities: Cognitive Behavioral Therapy Psychoeducation    Lorri Frederick, LCSW

## 2023-02-01 NOTE — [facility] Group Notes (Signed)
[facility] Group Notes:  (Nursing/MHT/Case Management/Adjunct)  Date:  02/01/2023  Time:  8:48 AM  Type of Therapy:   Goals group  Participation Level:  Active  Participation Quality:  Appropriate  Affect:  Appropriate  Cognitive:  Appropriate  Insight:  Appropriate  Engagement in Group:  Engaged  Modes of Intervention:  Orientation  Summary of Progress/Problems: Goal is to not sleep too much.  Cynthia [facility]send 02/01/2023, 8:48 AM

## 2023-02-01 NOTE — Progress Notes (Signed)
[facility] MD Progress Note  02/01/2023 12:17 PM Cynthia Townsend  MRN:  [id number]  Principal Problem: <principal problem not specified> Diagnosis: Active Problems:   Generalized anxiety disorder   Hypothyroidism   Mild persistent asthma   Hypokalemia   HTN (hypertension)   Acute on chronic diastolic CHF (congestive heart failure) (HCC)   Bipolar 1 disorder, mixed, moderate (HCC)   MDD (major depressive disorder), recurrent severe, without psychosis (HCC)   Leg swelling   Reason for Admission:  Cynthia Townsend is a 42 yo female with a reported PPHx of depression, anxiety, ADHD, eating disorders (bulimia, anorexia, binging)and a prior diagnosis of bipolar disorder. She has 1 prior psychiatric hospitalization and reports a history of chronic depression and suicidal ideations. She arrived to [facility] on 01/27/2023 for SI with attempted overdose of prescribed medications in the setting of worsening depression and multiple chronic health conditions. She was admitted to [facility] on 01/28/2023 under IVC. PMHx is significant for HTN, asthma, hypothyroidism s/p thyroidectomy (Feb 2024), CHF, and OSA on CPAP.  (admitted on 01/28/2023, total  LOS: 4 days )  Yesterday, the psychiatry team made following recommendations:  Discontinue Xanax on admission Discontinue Zoloft, concern for worsening mania Continue Klonopin 0.5 mg twice daily as needed for anxiety We plan on discontinuing Remeron, currently tapering off Decrease Remeron 30 to 15 mg Increase Abilify 2 mg to 5 mg starting tomorrow.  Patient will receive additional 2 mg today, for total of Abilify 4 mg. Increase trazodone 100 mg to 150 mgnightly for insomnia  Information Obtained Today During Patient Interview:  Patient seen this AM. Patient reports feeling well. She reports sleeping fairly, noting 5 hours of sleep. She reports good appetite. Patient feels that her medications have been helpful. She denies side effects from the medications. She feels ready to  return home to her husband and two children. She agrees with following up with her PCP for her medical conditions. She denies SI, HI, and AVH.  Past Psychiatric Hx: Current Psychiatrist: Dr. Tonna Corner at [facility]  Current Therapist: Denies Previous Psych Diagnoses: Anxiety diagnosed in her early 8s, bipolar disorder, depression, ADD Current psychiatric medications: Reports Xanax 0.5 mg 3 times daily for anxiety, mirtazapine 60 mg nightly for depression, and trazodone 100 mg nightly for insomnia Psychiatric medication history: Reports previously trying lithium, Lexapro, Depakote, Celexa and a short time period.  Reports feeling very "crazy" during this time.  Is unable to specify if any of these medications were helpful Reports tremors while on Vraylar Reports Zoloft when she had given birth to her son.  At that time she was on 50 mg, did not find this medication helpful.  Denied any side effects to this medication in the past Psychotherapy hx: Denies but received unspecified therapy sessions on coping skills History of suicide: as described in the HPI History of homicide or aggression: as described in the HPI Psychiatric medication compliance history: Neuromodulation history: None identified on chart review     Substance Abuse Hx: Alcohol: Reports infrequent use.  Tobacco: Quit Oct 2023 "cold [place]". Prior to that she smoked 0.5 ppd for 10 years.  Cannabis: consumes delta 8 gummies once every other week. Reports it helps her relax.  Illicit drugs: No recent illicit substance use.  Rx drug abuse: Used adderall in the past for weight loss Rehab hx: Denies   Past Medical History: PCP: Dr. Wilfred Curtis, MD  Dx: Anemia, HTN, asthma, hypothyroidism s/p thyroidectomy,HLD, migraines, Meds:  Amlodipine (HTN), budesonide-formoterol fumarate (asthma), carvedilol (palpitations in  past, has SOB + edema on this med), metformi, levothyroxine, furosemide (CHF), omeprazole, spironolactone,  semaglutide ALL: penicillins, sulfa, bee venom Hosp: Not formally assessed Surgeries: Thyroidectomy in Feb 2023 after identifying malignancy on biopsy Trauma: No head trauma, concussions, LOC identified on chart review Seizures: None identified on chart review   Reproductive Psychiatric History: Mood shifts around menses: Denies Pregnancy history:  2017: led to post birth pulm edema and eclampsia and post partum hemorrhage  Perinatal/postpartum psychiatric symptoms: Peripartum anxiety and panic attacks Lactating: No Planning to become pregnant: No  LMP: Irregular 2/2 IUD Contraceptives: IUD for last 3 years   Family History: Psych: Mom- alcohol use disorder Substance use family hx: Reports 3 sisters have substance use issues   Social History: Living situation: Lives with ex-fiance, [place], and her two children in [place]. She also has a boxer named Scientist, clinical (histocompatibility and immunogenetics).  Education: completed 2 associates degree Work: has been Engineer, site since 2014 Marital Status: Single Children: Has 2 children, Cynthia Townsend 7 yo, Cynthia Townsend 42yo Abuse: None except as detailed in HPI Legal: Denies Military: No     Past Medical History:  Past Medical History:  Diagnosis Date   ADD (attention deficit disorder)    Anemia    post partum   Anemia requiring transfusions    of iron   Anxiety    Asthma    Blood transfusion without reported diagnosis    Carpal tunnel syndrome during pregnancy    CHF (congestive heart failure) (HCC)    Chicken pox    Classic migraine with aura 09/20/2015   Suzette Battiest disease (radial styloid tenosynovitis)    Depression    Ectopic pregnancy    Insomnia    Laceration of finger    left index with nerve involvement   Pneumonia    Postpartum hemorrhage    Pre-eclampsia in postpartum period    Pregnancy induced hypertension    Pulmonary edema    Recurrent upper respiratory infection (URI)    Sciatic pain    Subclinical hypothyroidism    sub-clinical   Urticaria     dogs   Vitamin D deficiency    Family History:  Family History  Problem Relation Age of Onset   Heart disease Mother        STENT PLACEMENT   Alcohol abuse Mother    Lung cancer Mother    Hyperlipidemia Mother    Mental illness Mother    Hyperthyroidism Mother    COPD Mother    Heart attack Father        STENT PLACEMENT   Hyperlipidemia Father    Heart disease Father    Transient ischemic attack Father    Pulmonary embolism Father    AAA (abdominal aortic aneurysm) Father    Heart disease Sister    Mental illness Sister    Heart attack Sister    Diabetes Sister    Cancer Sister        CERVICAL   Heart attack Sister 72   Rheum arthritis Maternal Grandmother    Heart attack Maternal Grandfather 30   Heart disease Maternal Uncle 40   Colon cancer Paternal Uncle    Current Medications: Current Facility-Administered Medications  Medication Dose Route Frequency Provider Last Rate Last Admin   acetaminophen (TYLENOL) tablet 650 mg  650 mg Oral Q6H PRN Attiah, Nadir, MD       albuterol (VENTOLIN HFA) 108 (90 Base) MCG/ACT inhaler 2 puff  2 puff Inhalation Q6H PRN Sarita Bottom, MD  alum & mag hydroxide-simeth (MAALOX/MYLANTA) 200-200-20 MG/5ML suspension 30 mL  30 mL Oral Q4H PRN Abbott Pao, Nadir, MD       ARIPiprazole (ABILIFY) tablet 5 mg  5 mg Oral Daily Carrion-Carrero, Margely, MD   5 mg at 02/01/23 3086   clonazePAM (KLONOPIN) disintegrating tablet 0.25 mg  0.25 mg Oral TID PRN Sarita Bottom, MD   0.25 mg at 01/31/23 2334   dapagliflozin propanediol (FARXIGA) tablet 10 mg  10 mg Oral QAC breakfast Abbott Pao, Nadir, MD   10 mg at 02/01/23 5784   diphenhydrAMINE (BENADRYL) capsule 50 mg  50 mg Oral TID PRN Sarita Bottom, MD       Or   diphenhydrAMINE (BENADRYL) injection 50 mg  50 mg Intramuscular TID PRN Abbott Pao, Nadir, MD       haloperidol (HALDOL) tablet 5 mg  5 mg Oral TID PRN Sarita Bottom, MD       Or   haloperidol lactate (HALDOL) injection 5 mg  5 mg Intramuscular TID  PRN Sarita Bottom, MD       levothyroxine (SYNTHROID) tablet 200 mcg  200 mcg Oral [id number] Abbott Pao, Nadir, MD   200 mcg at 02/01/23 5284   lisinopril (ZESTRIL) tablet 2.5 mg  2.5 mg Oral Daily Abbott Pao, Nadir, MD   2.5 mg at 02/01/23 0751   loratadine (CLARITIN) tablet 10 mg  10 mg Oral Daily Abbott Pao, Nadir, MD   10 mg at 02/01/23 0751   magnesium hydroxide (MILK OF MAGNESIA) suspension 30 mL  30 mL Oral Daily PRN Abbott Pao, Nadir, MD       melatonin tablet 5 mg  5 mg Oral QHS Carrion-Carrero, Margely, MD   5 mg at 01/31/23 2144   mometasone-formoterol (DULERA) 200-5 MCG/ACT inhaler 2 puff  2 puff Inhalation BID Gery Pray, MD   2 puff at 02/01/23 0750   pantoprazole (PROTONIX) EC tablet 40 mg  40 mg Oral Daily Attiah, Nadir, MD   40 mg at 02/01/23 0751   polyethylene glycol (MIRALAX / GLYCOLAX) packet 17 g  17 g Oral Daily PRN Lorri Frederick, MD   17 g at 01/31/23 0831   potassium chloride SA (KLOR-CON M) CR tablet 40 mEq  40 mEq Oral Q4H Zigmund Daniel., MD   40 mEq at 02/01/23 1114   Semaglutide(0.25 or 0.5MG /DOS) SOPN 0.5 mg  0.5 mg Subcutaneous Weekly Abbott Pao, Nadir, MD       spironolactone (ALDACTONE) tablet 25 mg  25 mg Oral Daily Abbott Pao, Nadir, MD   25 mg at 02/01/23 0751   traZODone (DESYREL) tablet 200 mg  200 mg Oral QHS Carrion-Carrero, Margely, MD   200 mg at 01/31/23 2144    Lab Results:  Results for orders placed or performed during the hospital encounter of 01/28/23 (from the past 48 hour(s))  Urinalysis, Routine w reflex microscopic -Urine, Clean Catch     Status: Abnormal   Collection Time: 01/30/23  4:44 PM  Result Value Ref Range   Color, Urine STRAW (A) YELLOW   APPearance CLEAR CLEAR   Specific Gravity, Urine 1.011 1.005 - 1.030   pH 5.0 5.0 - 8.0   Glucose, UA >=500 (A) NEGATIVE mg/dL   Hgb urine dipstick NEGATIVE NEGATIVE   Bilirubin Urine NEGATIVE NEGATIVE   Ketones, ur NEGATIVE NEGATIVE mg/dL   Protein, ur NEGATIVE NEGATIVE mg/dL   Nitrite NEGATIVE  NEGATIVE   Leukocytes,Ua NEGATIVE NEGATIVE   RBC / HPF 0-5 0 - 5 RBC/hpf   WBC, UA 0-5 0 - 5 WBC/hpf  Bacteria, UA RARE (A) NONE SEEN   Squamous Epithelial / HPF 0-5 0 - 5 /HPF    Comment: Performed at [facility], [address] [place], [place] [address]  Basic metabolic panel     Status: Abnormal   Collection Time: 01/30/23  7:29 PM  Result Value Ref Range   Sodium 130 (L) 135 - 145 mmol/L   Potassium 3.5 3.5 - 5.1 mmol/L   Chloride 95 (L) 98 - 111 mmol/L   CO2 22 22 - 32 mmol/L   Glucose, Bld 97 70 - 99 mg/dL    Comment: Glucose reference range applies only to samples taken after fasting for at least 8 hours.   BUN 17 6 - 20 mg/dL   Creatinine, Ser 5.28 0.44 - 1.00 mg/dL   Calcium 8.5 (L) 8.9 - 10.3 mg/dL   GFR, Estimated >41 >32 mL/min    Comment: (NOTE) Calculated using the CKD-EPI Creatinine Equation (2021)    Anion gap 13 5 - 15    Comment: Performed at [facility], [address], [place], [place] [address]  Basic metabolic panel     Status: Abnormal   Collection Time: 01/31/23  6:18 AM  Result Value Ref Range   Sodium 133 (L) 135 - 145 mmol/L   Potassium 3.3 (L) 3.5 - 5.1 mmol/L   Chloride 97 (L) 98 - 111 mmol/L   CO2 24 22 - 32 mmol/L   Glucose, Bld 112 (H) 70 - 99 mg/dL    Comment: Glucose reference range applies only to samples taken after fasting for at least 8 hours.   BUN 15 6 - 20 mg/dL   Creatinine, Ser 2.72 0.44 - 1.00 mg/dL   Calcium 8.7 (L) 8.9 - 10.3 mg/dL   GFR, Estimated >53 >66 mL/min    Comment: (NOTE) Calculated using the CKD-EPI Creatinine Equation (2021)    Anion gap 12 5 - 15    Comment: Performed at [facility], [address] [place], [place] [address]  Hemoglobin A1c     Status: Abnormal   Collection Time: 01/31/23  6:18 AM  Result Value Ref Range   Hgb A1c MFr Bld 6.2 (H) 4.8 - 5.6 %    Comment: (NOTE) Pre diabetes:          5.7%-6.4%  Diabetes:               >6.4%  Glycemic control for   <7.0% adults with diabetes    Mean Plasma Glucose 131.24 mg/dL    Comment: Performed at [facility], [address], [place], [place] [address]  Lipid panel     Status: Abnormal   Collection Time: 01/31/23  6:18 AM  Result Value Ref Range   Cholesterol 171 0 - 200 mg/dL   Triglycerides 563 <875 mg/dL   HDL 36 (L) >64 mg/dL   Total CHOL/HDL Ratio 4.8 RATIO   VLDL 22 0 - 40 mg/dL   LDL Cholesterol 332 (H) 0 - 99 mg/dL    Comment:        Total Cholesterol/HDL:CHD Risk Coronary Heart Disease Risk Table                     Men   Women  1/2 Average Risk   3.4   3.3  Average Risk       5.0   4.4  2 X Average Risk   9.6   7.1  3 X Average  Risk  23.4   11.0        Use the calculated Patient Ratio above and the CHD Risk Table to determine the patient's CHD Risk.        ATP III CLASSIFICATION (LDL):  <100     mg/dL   Optimal  132-440  mg/dL   Near or Above                    Optimal  130-159  mg/dL   Borderline  102-725  mg/dL   High  >366     mg/dL   Very High Performed at [facility], [address], [place], [place] [address]   Glucose, capillary     Status: Abnormal   Collection Time: 01/31/23  2:57 PM  Result Value Ref Range   Glucose-Capillary 149 (H) 70 - 99 mg/dL    Comment: Glucose reference range applies only to samples taken after fasting for at least 8 hours.  Magnesium     Status: None   Collection Time: 01/31/23  6:14 PM  Result Value Ref Range   Magnesium 2.3 1.7 - 2.4 mg/dL    Comment: Performed at [facility], [address], [place], [place] [address]  TSH     Status: Abnormal   Collection Time: 01/31/23  6:14 PM  Result Value Ref Range   TSH 12.639 (H) 0.350 - 4.500 uIU/mL    Comment: Performed by a 3rd Generation assay with a functional sensitivity of <=0.01 uIU/mL. Performed at [facility], [address] [place], [place] [address]   Basic metabolic panel      Status: Abnormal   Collection Time: 02/01/23  6:35 AM  Result Value Ref Range   Sodium 133 (L) 135 - 145 mmol/L   Potassium 3.2 (L) 3.5 - 5.1 mmol/L   Chloride 98 98 - 111 mmol/L   CO2 23 22 - 32 mmol/L   Glucose, Bld 122 (H) 70 - 99 mg/dL    Comment: Glucose reference range applies only to samples taken after fasting for at least 8 hours.   BUN 17 6 - 20 mg/dL   Creatinine, Ser 5.64 0.44 - 1.00 mg/dL   Calcium 8.2 (L) 8.9 - 10.3 mg/dL   GFR, Estimated >33 >29 mL/min    Comment: (NOTE) Calculated using the CKD-EPI Creatinine Equation (2021)    Anion gap 12 5 - 15    Comment: Performed at [facility], [address], [place], [place] [address]  Magnesium     Status: None   Collection Time: 02/01/23  6:35 AM  Result Value Ref Range   Magnesium 2.2 1.7 - 2.4 mg/dL    Comment: Performed at [facility], [address] [place], [place] [address]    Blood Alcohol level:  Lab Results  Component Value Date   ETH <10 01/27/2023    Metabolic Labs: Lab Results  Component Value Date   HGBA1C 6.2 (H) 01/31/2023   MPG 131.24 01/31/2023   No results found for: "PROLACTIN" Lab Results  Component Value Date   CHOL 171 01/31/2023   TRIG 110 01/31/2023   HDL 36 (L) 01/31/2023   CHOLHDL 4.8 01/31/2023   VLDL 22 01/31/2023   LDLCALC 113 (H) 01/31/2023    Sleep:Sleep: Fair Number of Hours of Sleep: 5   Physical Findings: AIMS: No  CIWA:    COWS:     Psychiatric Specialty Exam:   Presentation  General  Appearance: Appropriate for Environment; Casual  Eye Contact:Good  Speech:Clear and Coherent  Speech Volume:Normal  Handedness:-- (Not assessed)   Mood and Affect  Mood:Euthymic  Affect:Congruent   Thought Process  Thought Processes:Coherent; Goal Directed  Descriptions of Associations:Intact  Orientation:Full (Time, Place and Person)  Thought Content:Logical  History of Schizophrenia/Schizoaffective disorder:  none Duration of Psychotic Symptoms: none Hallucinations:Hallucinations: None  Ideas of Reference:None  Suicidal Thoughts:Suicidal Thoughts: No  Homicidal Thoughts:Homicidal Thoughts: No   Sensorium  Memory:Immediate Good; Recent Good; Remote Good  Judgment:Fair  Insight:Fair   Executive Functions  Concentration:Fair  Attention Span:Fair  Recall:Fair  Fund of Knowledge:Fair  Language:Fair   Psychomotor Activity  Psychomotor Activity:Psychomotor Activity: Normal   Assets  Assets:Communication Skills; Desire for Improvement; Leisure Time; Social Support; Resilience   Sleep  Sleep:Sleep: Fair Number of Hours of Sleep: 5    Physical Exam: Physical Exam Vitals reviewed.  Constitutional:      Appearance: She is obese.  HENT:     Head: Normocephalic and atraumatic.  Cardiovascular:     Rate and Rhythm: Normal rate.  Pulmonary:     Effort: Pulmonary effort is normal.  Neurological:     Mental Status: She is alert.    Review of Systems  Constitutional:  Negative for chills and fever.  Respiratory:  Positive for shortness of breath.   Cardiovascular:  Positive for leg swelling.   Blood pressure 130/83, pulse 86, temperature 98.4 F (36.9 C), temperature source Oral, resp. rate 18, height 5\' 2"  (1.575 m), weight 114.9 kg, SpO2 99%. Body mass index is 46.35 kg/m.  Treatment Plan Summary: Daily contact with patient to assess and evaluate symptoms and progress in treatment and Medication management   ASSESSMENT: Kirstein Erbacher is a 42 yo female with a reported PPHx of depression, anxiety, ADHD, eating disorders (bulimia, anorexia, binging)and a prior diagnosis of bipolar disorder. She has 1 prior psychiatric hospitalization and reports a history of chronic depression and suicidal ideations. She arrived to [facility] on 01/27/2023 for SI with attempted overdose of prescribed medications in the setting of worsening depression and multiple chronic health conditions.  She was admitted to [facility] on 01/28/2023 under IVC. PMHx is significant for HTN, asthma, hypothyroidism s/p thyroidectomy (Feb 2024), CHF, and OSA on CPAP   Diagnoses / Active Problems: Bipolar 1 disorder, current episode mixed GAD with panic attacks PTSD Binge eating disorder Tobacco use disorder, in sustained remission Stimulant use disorder (adderall), in sustained remission R/o kleptomania Rule out borderline personality disorder  PLAN: Safety and Monitoring:  -- INVOLUNTARY admission to inpatient psychiatric unit for safety, stabilization and treatment  -- Daily contact with patient to assess and evaluate symptoms and progress in treatment  -- Patient's case to be discussed in multi-disciplinary team meeting  -- Observation Level : q15 minute checks  -- Vital signs:  q12 hours  -- Precautions: suicide, elopement, and assault  2. Psychiatric Diagnoses and Treatment:   Remeron taper finished today Increase Abilify to 5 mg (from 4 mg) Continue Klonopin 0.5 mg twice daily as needed for anxiety Continue trazodone 200 mg nightly for insomnia -- The risks/benefits/side-effects/alternatives to this medication were discussed in detail with the patient and time was given for questions. The patient consents to medication trial.              -- Metabolic profile and EKG monitoring obtained while on an atypical antipsychotic  BMI: 47.26 kg/m2 TSH: 12 Lipid Panel: LDL113 HbgA1c: 6.2 QTc: 461 on 01/29/2023 Continue Melatonin 5 mg Discontinued  Xanax on admission Discontinued Zoloft, concern for worsening mania             -- Encouraged patient to participate in unit milieu and in scheduled group therapies   -- Short Term Goals: Ability to identify changes in lifestyle to reduce recurrence of condition will improve and Ability to verbalize feelings will improve  -- Long Term Goals: Improvement in symptoms so as ready for discharge    3. Medical Issues Being Addressed:  Dr. Lowell Guitar, MD from  [facility] following. Appreciate his recommendations.   #HFpEF (LVEF 60-65% 11/2022) Low-salt diet Daily weights Torsemide 40 mg twice daily Spironolactone 25 mg daily UA- bacteria +, glucose >500  #HTN Lisinopril Holding Coreg and Norvasc  #Hypokalemia K+ 3.4 on 01/30/2023 BMP today 3.3 Continue spironolactone  #Mild persistent asthma Symbicort  Zyrtec MDI PRN  #Hypothyroidism TSH 12.6  Synthroid 200 mcg daily  #OSA CPAP  #Morbid Obesity #T2DM, controlled (A1C 4.9%) Semaglutide for weight loss   4. Discharge Planning:   -- Social work and case management to assist with discharge planning and identification of hospital follow-up needs prior to discharge  -- Estimated LOS: 8/26/8/27  -- Discharge Concerns: Need to establish a safety plan; Medication compliance and effectiveness  -- Discharge Goals: Return home with outpatient referrals for mental health follow-up including medication management/psychotherapy   I certify that inpatient services furnished can reasonably be expected to improve the patient's condition.   This note was created using a voice recognition software as a result there may be grammatical errors inadvertently enclosed that do not reflect the nature of this encounter. Every attempt is made to correct such errors.   Kizzie Ide, MD PGY-2 Psychiatry

## 2023-02-01 NOTE — Progress Notes (Signed)
   02/01/23 2208  Psych Admission Type (Psych Patients Only)  Admission Status Involuntary  Psychosocial Assessment  Patient Complaints Anxiety  Eye Contact Fair  Facial Expression Animated  Affect Appropriate to circumstance  Speech Logical/coherent  Interaction Assertive  Motor Activity Other (Comment) (WDL)  Appearance/Hygiene Unremarkable  Behavior Characteristics Appropriate to situation  Mood Pleasant  Thought Process  Coherency WDL  Content WDL  Delusions None reported or observed  Perception WDL  Hallucination None reported or observed  Judgment WDL  Confusion None  Danger to Self  Current suicidal ideation? Denies  Agreement Not to Harm Self Yes  Description of Agreement verbal  Danger to Others  Danger to Others None reported or observed  Danger to Others Abnormal  Harmful Behavior to others No threats or harm toward other people  Destructive Behavior No threats or harm toward property

## 2023-02-01 NOTE — Progress Notes (Signed)
Tiffany slept for 5 hrs last night. Pt denies SI/HI/AVH. Pt was pleasant on approach, no new c/o's. Pt was given a log and a toilet hat to track I's and O's. Pt took morning meds. Pt remains safe.

## 2023-02-01 NOTE — [facility] Group Notes (Signed)
[facility] Group Notes:  (Nursing/MHT/Case Management/Adjunct)  Date:  02/01/2023  Time: 2015  Type of Therapy:   wrap up group  Participation Level:  Active  Participation Quality:  Appropriate, Attentive, Sharing, and Supportive  Affect:  Appropriate  Cognitive:  Alert  Insight:  Improving  Engagement in Group:  Engaged  Modes of Intervention:  Clarification, Education, and Socialization  Summary of Progress/Problems: Positive thinking and self-care were discussed.   Cynthia Townsend 02/01/2023, 9:07 PM

## 2023-02-01 NOTE — Progress Notes (Signed)
I and O for today starting at [phone]  Input: 2220 mL  Output: 2650 mL
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
## 2023-02-02 DIAGNOSIS — F332 Major depressive disorder, recurrent severe without psychotic features: Secondary | ICD-10-CM | POA: Diagnosis not present

## 2023-02-02 MED ORDER — TRAZODONE HCL 100 MG PO TABS: 200.0000 mg | ORAL_TABLET | Freq: Every day | ORAL | 0 refills | Status: AC

## 2023-02-02 MED ORDER — ARIPIPRAZOLE 5 MG PO TABS: 5.0000 mg | ORAL_TABLET | Freq: Every day | ORAL | 0 refills | Status: DC

## 2023-02-02 MED ORDER — CLONAZEPAM 0.25 MG PO TBDP: 0.2500 mg | ORAL_TABLET | Freq: Three times a day (TID) | ORAL | 0 refills | Status: DC | PRN

## 2023-02-02 MED ORDER — MELATONIN 5 MG PO TABS: 5.0000 mg | ORAL_TABLET | Freq: Every day | ORAL | 0 refills | Status: DC

## 2023-02-02 MED ORDER — SPIRONOLACTONE 25 MG PO TABS: 25.0000 mg | ORAL_TABLET | Freq: Every day | ORAL | 0 refills | Status: DC

## 2023-02-02 NOTE — Progress Notes (Signed)
  [facility] Adult Case Management Discharge Plan :  Will you be returning to the same living situation after discharge:  Yes,  Patient will be returning home. At discharge, do you have transportation home?: Yes,  Patient states fiance will be picking her up at discharge. Do you have the ability to pay for your medications: Yes,  Patient has insurance to cover   Release of information consent forms completed and in the chart;  Patient's signature needed at discharge.  Patient to Follow up at:  Follow-up Information     [facility] Follow up on 02/24/2023.   Why: You have an appointment for medication management services on  02/24/23 at 6:20 pm.  This appointment will be Virtual via telehealth. Contact information: [address] [place] [address] [phone]         [facility] Go on 02/19/2023.   Why: You have an appointment for therapy services on 02/19/23 at 9:30 am.  This appointment will be held in person. Contact information: [address] [place] [address] [phone]                 Next level of care provider has access to [facility] Link:no  Safety Planning and Suicide Prevention discussed: Yes,  completed with fiance Arlys John     Has patient been referred to the Quitline?: Patient does not use tobacco/nicotine products  Patient has been referred for addiction treatment: No known substance use disorder.  Dennice Tindol O Trase Bunda, LCSWA 02/02/2023, 9:15 AM

## 2023-02-02 NOTE — Group Note (Signed)
Date:  02/02/2023 Time:  10:01 AM  Group Topic/Focus:  Goals Group:   The focus of this group is to help patients establish daily goals to achieve during treatment and discuss how the patient can incorporate goal setting into their daily lives to aide in recovery.    Participation Level:  Active  Participation Quality:  Appropriate  Affect:  Appropriate and Blunted  Cognitive:  Alert  Insight: Appropriate  Engagement in Group:  Engaged  Modes of Intervention:  Discussion  Additional Comments:   Beckie Busing 02/02/2023, 10:01 AM

## 2023-02-02 NOTE — Progress Notes (Signed)
   02/02/23 0530  15 Minute Checks  Location Bedroom  Visual Appearance Calm  Behavior Composed  Sleep (Behavioral Health Patients Only)  Calculate sleep? (Click Yes once per 24 hr at 0600 safety check) Yes  Documented sleep last 24 hours 4.5

## 2023-02-02 NOTE — Consult Note (Signed)
Patient to be discharged from [facility] today.  See final recommendations in note from 8/24.  If any additional questions or concerns, please don't hesitate to let us know.  Lacretia Nicks, MD [facility]

## 2023-02-02 NOTE — Progress Notes (Signed)
Pt discharged to lobby. Husband and two kids were there for pt. Pt was stable and appreciative at that time. All papers and prescriptions were given and valuables returned. Suicide safety plan completed and copy given to patient. Verbal understanding expressed. Denies SI/HI and A/VH. Pt given opportunity to express concerns and ask questions.

## 2023-02-02 NOTE — Plan of Care (Signed)
  Problem: Education: Goal: Knowledge of  General Education information/materials will improve Outcome: Progressing   Problem: Education: Goal: Verbalization of understanding the information provided will improve Outcome: Progressing

## 2023-02-02 NOTE — [facility] Suicide Risk Assessment (Signed)
[facility] Discharge Suicide Risk Assessment   Principal Problem: <principal problem not specified> Discharge Diagnoses: Active Problems:   Generalized anxiety disorder   Hypothyroidism   Mild persistent asthma   Hypokalemia   HTN (hypertension)   Acute on chronic diastolic CHF (congestive heart failure) (HCC)   Bipolar 1 disorder, mixed, moderate (HCC)   MDD (major depressive disorder), recurrent severe, without psychosis (HCC)   Leg swelling   Total Time spent with patient: 45 minutes  Reason for admission: Cynthia Townsend is a 42 yo female with a reported PPHx of depression, anxiety, ADHD, eating disorders (bulimia, anorexia, binging)and a prior diagnosis of bipolar disorder. She has 1 prior psychiatric hospitalization and reports a history of chronic depression and suicidal ideations. She arrived to [facility] on 01/27/2023 for SI with attempted overdose of prescribed medications in the setting of worsening depression and multiple chronic health conditions. She was admitted to [facility] on 01/28/2023 under IVC. PMHx is significant for HTN, asthma, hypothyroidism s/p thyroidectomy (Feb 2024), CHF, and OSA on CPAP.   PTA Medications:  Medications Prior to Admission  Medication Sig Dispense Refill Last Dose   albuterol (VENTOLIN HFA) 108 (90 Base) MCG/ACT inhaler Inhale 2 puffs into the lungs every 6 (six) hours as needed for wheezing 6.7 g 1     ALPRAZolam (XANAX) 0.5 MG tablet Take 1 tablet (0.5 mg total) by mouth 3 (three) times daily only if needed 90 tablet 0     amLODipine (NORVASC) 5 MG tablet Take 1 tablet (5 mg total) by mouth daily. 90 tablet 3     baclofen (LIORESAL) 10 MG tablet Take 1 tablet (10 mg total) by mouth 2 (two) times daily as needed. 180 tablet 1     carvedilol (COREG) 6.25 MG tablet Take 1 tablet (6.25 mg total) by mouth 2 (two) times daily. 180 tablet 3     cetirizine (ZYRTEC) 10 MG tablet Take 10 mg by mouth at bedtime as needed for allergies.         Cholecalciferol 125 MCG (5000  UT) capsule           dapagliflozin propanediol (FARXIGA) 10 MG TABS tablet Take 1 tablet (10 mg total) by mouth daily before breakfast. 30 tablet 6     EPINEPHrine 0.3 mg/0.3 mL IJ SOAJ injection Use device as directed for severe allergic reaction. 2 each 0     furosemide (LASIX) 40 MG tablet Take 1.5 tablets (60 mg total) by mouth daily. (Patient taking differently: Take 40 mg by mouth as needed for fluid or edema.) 135 tablet 3     levonorgestrel (MIRENA) 20 MCG/DAY IUD by Intrauterine route.         levothyroxine (SYNTHROID) 200 MCG tablet Take 200 mcg by mouth every morning.         lisinopril (ZESTRIL) 2.5 MG tablet Take 2.5 mg by mouth daily.         mirtazapine (REMERON) 30 MG tablet Take 60 mg by mouth at bedtime.         omeprazole (PRILOSEC) 40 MG capsule Take 40 mg by mouth daily.         OZEMPIC, 0.25 OR 0.5 MG/DOSE, 2 MG/3ML SOPN Inject 0.5 mg into the skin once a week.         potassium chloride SA (KLOR-CON M) 20 MEQ tablet Take 1.5 tablets (30 mEq total) by mouth daily. (Patient taking differently: Take 20 mEq by mouth as needed (with Lasix).) 135 tablet 3     spironolactone (  ALDACTONE) 25 MG tablet Take 1 tablet (25 mg total) by mouth daily. 90 tablet 3     SYMBICORT 160-4.5 MCG/ACT inhaler Inhale 2 puffs into the lungs daily as needed (wheezing, shortness of breath).         traZODone (DESYREL) 100 MG tablet Take 1 tablet (100 mg total) by mouth at bedtime. (Patient taking differently: Take 200 mg by mouth at bedtime.)       Hospital Course:  During initial evaluation patient reports a lifelong history of depression and anxiety since she was a child.  She has had worsening depression and chronic suicidal ideations for the past 3 years.  She admits that she has been writing goodbye letters on the notes section of her phone for these past 3 years, had been planning an attempt for some time.  She is unable to identify any specific triggers that caused her to attempt recently.   Reports that she has been accumulating her prescription medications and had been minimizing her symptoms with her outpatient psychiatrist.  She is now regretful of this attempt, continues to experience thoughts of death and dying during the interview, without plan or intent.  Her current medications include Remeron for depression and trazodone for insomnia.  She reports that the Remeron has not improved her symptoms of depression.  During the patient's hospitalization, patient had extensive initial psychiatric evaluation, and follow-up psychiatric evaluations every day.  Psychiatric diagnoses provided upon initial assessment: MDD recurrent severe, this diagnosis was changed during hospital stay to bipolar disorder type I mixed episode recent based on information obtained and symptomatology reported during hospital stay  Patient's psychiatric medications were adjusted on admission: Discontinue Xanax Start Klonopin 0.5 mg twice daily as needed for anxiety We plan on discontinuing Remeron, will start the process of tapering off Decrease Remeron 60 mg to 30 mg nightly tonight Start Zoloft 50 mg for depression, with plan to titrate up to 100 mg by end of hospitalization Start Abilify 2 mg for mood stabilization, with plan to titrate up to 5 mg by end of hospitalization Continue trazodone 100 mg nightly for insomnia  During the hospitalization, other adjustments were made to the patient's psychiatric medication regimen: Abilify was titrated up to 5 mg daily for mood stabilization and depression, Zoloft was discontinued secondary to causing some symptoms consistent with hypomania, trazodone was titrated to 200 mg to help further with sleep, Remeron was discontinued to avoid contributing to hypomania or mania During hospital stay patient was noted to redirect her suicide attempt all through her stay able to discuss different coping skills with the stressors after discharge.  According to her fianc who visited  her several times during hospital stay she is back to baseline prior to discharge.  During hospital stay patient did report significant lower extremity edema and some shortness of breath on exertion, she was sent to emergency room and given IV Lasix cleared medically and sent back to [facility], in the next few days she was followed by hospitalist team who managed her medications for CHF and hypertension and provided recommendations at time of discharge as noted below. Patient's care was discussed during the interdisciplinary team meeting every day during the hospitalization.  The patient denied having side effects to prescribed psychiatric medication.  Gradually, patient started adjusting to milieu. The patient was evaluated each day by a clinical provider to ascertain response to treatment. Improvement was noted by the patient's report of decreasing symptoms, improved sleep and appetite, affect, medication tolerance, behavior, and participation  in unit programming.  Patient was asked each day to complete a self inventory noting mood, mental status, pain, new symptoms, anxiety and concerns.    Symptoms were reported as significantly decreased or resolved completely by discharge.   On day of discharge, patient was evaluated on 02/02/2023, patient continues to be excited about discharge denies any symptoms consistent with mania or hypomania, continues to present goal and future oriented.  The patient reports that their mood is stable. The patient denied having suicidal thoughts for more than 48 hours prior to discharge.  Patient denies having homicidal thoughts.  Patient denies having auditory hallucinations.  Patient denies any visual hallucinations or other symptoms of psychosis. The patient was motivated to continue taking medication with a goal of continued improvement in mental health.   The patient reports their target psychiatric symptoms of depression and SI responded well to the psychiatric medications,  and the patient reports overall benefit other psychiatric hospitalization. Supportive psychotherapy was provided to the patient. The patient also participated in regular group therapy while hospitalized. Coping skills, problem solving as well as relaxation therapies were also part of the unit programming.  Labs were reviewed with the patient, and abnormal results were discussed with the patient.  The patient is able to verbalize their individual safety plan to this provider.  Behavioral Events: None  Restraints: None  Groups: Attended and participated  Medications Changes: As above  Sleep  Sleep:Sleep: Fair Number of Hours of Sleep: 5 Patient reports some interrupted sleep the night before discharge mainly secondary to feeling excited about discharge plan.  She confirmed that she feels she will have slept much better if she is at home given also change of environment.  Musculoskeletal: Strength & Muscle Tone: within normal limits Gait & Station: normal Patient leans: N/A  Psychiatric Specialty Exam  General Appearance: appears at stated age, fairly dressed and groomed  Behavior: pleasant and cooperative  Psychomotor Activity:No psychomotor agitation or retardation noted   Eye Contact: good Speech: normal amount, tone, volume and latency   Mood: euthymic Affect: congruent, pleasant and interactive  Thought Process: linear, goal directed, no circumstantial or tangential thought process noted, no racing thoughts or flight of ideas Descriptions of Associations: intact Thought Content: Hallucinations: denies AH, VH , does not appear responding to stimuli Delusions: No paranoia or other delusions noted Suicidal Thoughts: denies SI, intention, plan  Homicidal Thoughts: denies HI, intention, plan   Alertness/Orientation: alert and fully oriented  Insight: fair, improved Judgment: fair, improved  Memory: intact  Executive Functions  Concentration: intact  Attention  Span: Fair Recall: intact Fund of Knowledge: fair   Art therapist  Concentration: intact Attention Span: Fair Recall: intact Fund of Knowledge: fair   Assets  Assets: Manufacturing systems engineer; Desire for Improvement; Leisure Time; Social Support; Resilience   Physical Exam: Physical Exam ROS Blood pressure (!) 144/80, pulse 100, temperature 98.2 F (36.8 C), temperature source Oral, resp. rate 20, height 5\' 2"  (1.575 m), weight 109 kg, SpO2 99%. Body mass index is 43.97 kg/m.  Mental Status Per Nursing Assessment::   On Admission:  NA  Demographic Factors:  Caucasian  Loss Factors: NA  Historical Factors: Prior suicide attempts and Impulsivity  Risk Reduction Factors:   Responsible for children under 19 years of age, Positive social support, Positive therapeutic relationship, and Positive coping skills or problem solving skills  Continued Clinical Symptoms: Symptoms improved significantly during hospital stay Bipolar Disorder:   Mixed State  Cognitive Features That Contribute To Risk:  None    Suicide Risk:  Minimal: No identifiable suicidal ideation.  Patients presenting with no risk factors but with morbid ruminations; may be classified as minimal risk based on the severity of the depressive symptoms   Follow-up Information     [facility] Follow up on 02/24/2023.   Why: You have an appointment for medication management services on  02/24/23 at 6:20 pm.  This appointment will be Virtual via telehealth. Contact information: [address] [place] [address] [phone]         [facility] Go on 02/19/2023.   Why: You have an appointment for therapy services on 02/19/23 at 9:30 am.  This appointment will be held in person. Contact information: [address] [place] [address] [phone]                 Plan Of Care/Follow-up recommendations:    Discharge recommendations:    Following  the recommendations were provided to patient as per hospitalist team: Resume home Lasix 40 mg twice daily, continue low-sodium diet and continue potassium 20 meq with Lasix Patient to weigh herself daily if weight goes up 2 to 3 pounds in a day or 5 pounds in a week to take an extra dose of Lasix for 3 days Patient to restart Coreg at time of discharge, amlodipine was discontinued by patient's cardiologist few weeks prior to admission Synthroid dose was increased during hospital stay patient has an appointment with her endocrinologist in 4 to 6 weeks with recommendation to repeat lab work at that time for further management.  Patient was instructed and she agreed to have her fianc at home manage her medications to provide her medication every 2 to 3 days this is to avoid any intentional or unintentional overdose.  Activity: as tolerated  Diet: Low-sodium diet  # It is recommended to the patient to continue psychiatric medications as prescribed, after discharge from the hospital.     # It is recommended to the patient to follow up with your outpatient psychiatric provider and PCP.   # It was discussed with the patient, the impact of alcohol, drugs, tobacco have been there overall psychiatric and medical wellbeing, and total abstinence from substance use was recommended the patient.ed.   # Prescriptions provided or sent directly to preferred pharmacy at discharge. Patient agreeable to plan. Given opportunity to ask questions. Appears to feel comfortable with discharge.    # In the event of worsening symptoms, the patient is instructed to call the crisis hotline, 911 and or go to the nearest ED for appropriate evaluation and treatment of symptoms. To follow-up with primary care provider for other medical issues, concerns and or health care needs   # Patient was discharged home with a plan to follow up as noted above.  -Follow-up with outpatient primary care doctor and other specialists -for  management of chronic medical disease, including: Patient was instructed and she agreed to call her primary care provider office and cardiologist office tomorrow morning to schedule follow-up appointment in the next 2 weeks to address medication management for congestive heart failure as well as medical problems.   Patient agrees with D/C instructions and plan.  The patient received suicide prevention pamphlet:  Yes Belongings returned:  Clothing and Valuables  Total Time Spent in Direct Patient Care:  I personally spent 45 minutes on the unit in direct patient care. The direct patient care time included face-to-face time with the patient, reviewing  the patient's chart, communicating with other professionals, and coordinating care. Greater than 50% of this time was spent in counseling or coordinating care with the patient regarding goals of hospitalization, psycho-education, and discharge planning needs.   Rylei Codispoti Abbott Pao 02/02/2023, 7:48 AM

## 2023-02-02 NOTE — Discharge Summary (Signed)
Physician Discharge Summary Note  Patient:  Cynthia Townsend is an 42 y.o., female MRN:  [id number] DOB:  [birth year] Patient phone:  [phone] (home)  Patient address:   [address] [place] [address],  Total Time spent with patient: 45 minutes  Date of Admission:  01/28/2023 Date of Discharge: 02/02/2023  Reason for Admission:  Cynthia Townsend is a 42 yo female with a reported PPHx of depression, anxiety, ADHD, eating disorders (bulimia, anorexia, binging)and a prior diagnosis of bipolar disorder. She has 1 prior psychiatric hospitalization and reports a history of chronic depression and suicidal ideations. She arrived to [facility] on 01/27/2023 for SI with attempted overdose of prescribed medications in the setting of worsening depression and multiple chronic health conditions. She was admitted to [facility] on 01/28/2023 under IVC. PMHx is significant for HTN, asthma, hypothyroidism s/p thyroidectomy (Feb 2024), CHF, and OSA on CPAP.   Principal Problem: <principal problem not specified> Discharge Diagnoses: Active Problems:   Generalized anxiety disorder   Hypothyroidism   Mild persistent asthma   Hypokalemia   HTN (hypertension)   Acute on chronic diastolic CHF (congestive heart failure) (HCC)   Bipolar 1 disorder, mixed, moderate (HCC)   MDD (major depressive disorder), recurrent severe, without psychosis (HCC)   Leg swelling   Past Psychiatric History:  Current Psychiatrist: Dr. Tonna Corner at [facility]  Current Therapist: Denies Previous Psych Diagnoses: Anxiety diagnosed in her early 23s, bipolar disorder, depression, ADD Current psychiatric medications: Reports Xanax 0.5 mg 3 times daily for anxiety, mirtazapine 60 mg nightly for depression, and trazodone 100 mg nightly for insomnia Psychiatric medication history: Reports previously trying lithium, Lexapro, Depakote, Celexa and a short time period.  Reports feeling very "crazy" during this time.  Is unable to specify if any  of these medications were helpful Reports tremors while on Vraylar Reports Zoloft when she had given birth to her son.  At that time she was on 50 mg, did not find this medication helpful.  Denied any side effects to this medication in the past Psychotherapy hx: Denies but received unspecified therapy sessions on coping skills History of suicide: as described in the HPI History of homicide or aggression: as described in the HPI Psychiatric medication compliance history: Neuromodulation history: None identified on chart review  Past Medical History:  Past Medical History:  Diagnosis Date   ADD (attention deficit disorder)    Anemia    post partum   Anemia requiring transfusions    of iron   Anxiety    Asthma    Blood transfusion without reported diagnosis    Carpal tunnel syndrome during pregnancy    CHF (congestive heart failure) (HCC)    Chicken pox    Classic migraine with aura 09/20/2015   Suzette Battiest disease (radial styloid tenosynovitis)    Depression    Ectopic pregnancy    Insomnia    Laceration of finger    left index with nerve involvement   Pneumonia    Postpartum hemorrhage    Pre-eclampsia in postpartum period    Pregnancy induced hypertension    Pulmonary edema    Recurrent upper respiratory infection (URI)    Sciatic pain    Subclinical hypothyroidism    sub-clinical   Urticaria    dogs   Vitamin D deficiency     Past Surgical History:  Procedure Laterality Date   BREAST BIOPSY Right    benign  punch bx   DILATION AND CURETTAGE OF UTERUS     DILATION AND  EVACUATION N/A 09/04/2015   Procedure: DILATATION AND EVACUATION;  Surgeon: Mitchel Honour, DO;  Location: [facility] OR[facility];  Service: Gynecology;  Laterality: N/A;   DNC     I & D EXTREMITY Left 04/10/2017   Procedure: LEFT INDEX FINGER WOUND REPAIR AND NERVE REPAIR;  Surgeon: Bradly Bienenstock, MD;  Location: [facility] OR;  Service: Orthopedics;  Laterality: Left;   thyroidectomy      Family History:  Family  History  Problem Relation Age of Onset   Heart disease Mother        STENT PLACEMENT   Alcohol abuse Mother    Lung cancer Mother    Hyperlipidemia Mother    Mental illness Mother    Hyperthyroidism Mother    COPD Mother    Heart attack Father        STENT PLACEMENT   Hyperlipidemia Father    Heart disease Father    Transient ischemic attack Father    Pulmonary embolism Father    AAA (abdominal aortic aneurysm) Father    Heart disease Sister    Mental illness Sister    Heart attack Sister    Diabetes Sister    Cancer Sister        CERVICAL   Heart attack Sister 1   Rheum arthritis Maternal Grandmother    Heart attack Maternal Grandfather 30   Heart disease Maternal Uncle 40   Colon cancer Paternal Uncle    Family Psychiatric  History:  Psych: Mom- alcohol use disorder Substance use family hx: Reports 3 sisters have substance use issues Social History:  Social History   Substance and Sexual Activity  Alcohol Use Yes     Social History   Substance and Sexual Activity  Drug Use Yes   Types: Marijuana   Comment: delta 8    Social History   Socioeconomic History   Marital status: Single    Spouse name: Not on file   Number of children: 0   Years of education: 16   Highest education level: Not on file  Occupational History   Occupation: CMA    Comment: [employer]  Tobacco Use   Smoking status: Former    Types: Cigarettes   Smokeless tobacco: Never  Vaping Use   Vaping status: Former  Substance and Sexual Activity   Alcohol use: Yes   Drug use: Yes    Types: Marijuana    Comment: delta 8   Sexual activity: Yes    Birth control/protection: I.U.D.  Other Topics Concern   Not on file  Social History Narrative   Lives at home w/ her fiance and new daughter.   Right-handed.   Drinks about 1-2 cups of caffeine per day.   Fun: Hiking, Zumba, sleep   Denies religious beliefs effecting health care.   Denies abuse and feels safe at home.    Social  Determinants of Health   Financial Resource Strain: Low Risk  (08/08/2022)   Received from [facility], [facility]   Overall Financial Resource Strain (CARDIA)    Difficulty of Paying Living Expenses: Not very hard  Recent Concern: Financial Resource Strain - Medium Risk (06/26/2022)   Received from [facility]   Overall Financial Resource Strain (CARDIA)    Difficulty of Paying Living Expenses: Somewhat hard  Food Insecurity: Food Insecurity Present (01/28/2023)   Hunger Vital Sign    Worried About Running Out of Food in the Last Year: Sometimes true    Ran Out of Food in the Last Year:  Sometimes true  Transportation Needs: No Transportation Needs (01/28/2023)   PRAPARE - Administrator, Civil Service (Medical): No    Lack of Transportation (Non-Medical): No  Physical Activity: Insufficiently Active (06/26/2022)   Received from [facility], [facility]   Exercise Vital Sign    Days of Exercise per Week: 1 day    Minutes of Exercise per Session: 40 min  Stress: Stress Concern Present (08/08/2022)   Received from [facility], [facility] - Occupational Stress Questionnaire    Feeling of Stress : Rather much  Social Connections: Socially Isolated (07/02/2022)   Received from [facility], [facility]   Social Network    How would you rate your social network (family, work, friends)?: Little participation, lonely and socially isolated   Living situation: Lives with ex-fiance, Glendora, and her two children in [place]. She also has a boxer named Scientist, clinical (histocompatibility and immunogenetics).  Education: completed 2 associates degree Work: has been Engineer, site since 2014 Marital Status: Single Children: Has 2 children, [name] 7 yo, [name] 5yo Abuse: None except as detailed in HPI Legal: Denies Military: No  Substance Use History:  Alcohol: Reports infrequent use.  Tobacco: Quit Oct 2023 "cold [place]". Prior to that she smoked 0.5 ppd for 10 years.   Cannabis: consumes delta 8 gummies once every other week. Reports it helps her relax.  Illicit drugs: No recent illicit substance use.  Rx drug abuse: Used adderall in the past for weight loss Rehab hx: Denies  Hospital Course:   During initial evaluation patient reports a lifelong history of depression and anxiety since she was a child.  She has had worsening depression and chronic suicidal ideations for the past 3 years.  She admits that she has been writing goodbye letters on the notes section of her phone for these past 3 years, had been planning an attempt for some time.  She is unable to identify any specific triggers that caused her to attempt recently.  Reports that she has been accumulating her prescription medications and had been minimizing her symptoms with her outpatient psychiatrist.  She is now regretful of this attempt, continues to experience thoughts of death and dying during the interview, without plan or intent.  Her current medications include Remeron for depression and trazodone for insomnia.  She reports that the Remeron has not improved her symptoms of depression.  During the patient's hospitalization, patient had extensive initial psychiatric evaluation, and follow-up psychiatric evaluations every day.   Psychiatric diagnoses provided upon initial assessment: MDD recurrent severe, this diagnosis was changed during hospital stay to bipolar disorder type I mixed episode recent based on information obtained and symptomatology reported during hospital stay   Patient's psychiatric medications were adjusted on admission: Discontinue Xanax Start Klonopin 0.5 mg twice daily as needed for anxiety We plan on discontinuing Remeron, will start the process of tapering off Decrease Remeron 60 mg to 30 mg nightly tonight Start Zoloft 50 mg for depression, with plan to titrate up to 100 mg by end of hospitalization Start Abilify 2 mg for mood stabilization, with plan to titrate up to 5 mg  by end of hospitalization Continue trazodone 100 mg nightly for insomnia   During the hospitalization, other adjustments were made to the patient's psychiatric medication regimen: Abilify was titrated up to 5 mg daily for mood stabilization and depression, Zoloft was discontinued secondary to causing some symptoms consistent with hypomania, trazodone was titrated to 200 mg to help further  with sleep, Remeron was discontinued to avoid contributing to hypomania or mania During hospital stay patient was noted to redirect her suicide attempt all through her stay able to discuss different coping skills with the stressors after discharge.  According to her fianc who visited her several times during hospital stay she is back to baseline prior to discharge.   During hospital stay patient did report significant lower extremity edema and some shortness of breath on exertion, she was sent to emergency room and given IV Lasix cleared medically and sent back to [facility], in the next few days she was followed by hospitalist team who managed her medications for CHF and hypertension and provided recommendations at time of discharge as noted below. Patient's care was discussed during the interdisciplinary team meeting every day during the hospitalization.   The patient denied having side effects to prescribed psychiatric medication.   Gradually, patient started adjusting to milieu. The patient was evaluated each day by a clinical provider to ascertain response to treatment. Improvement was noted by the patient's report of decreasing symptoms, improved sleep and appetite, affect, medication tolerance, behavior, and participation in unit programming.  Patient was asked each day to complete a self inventory noting mood, mental status, pain, new symptoms, anxiety and concerns.     Symptoms were reported as significantly decreased or resolved completely by discharge.    On day of discharge, patient was evaluated on 02/02/2023,  patient continues to be excited about discharge denies any symptoms consistent with mania or hypomania, continues to present goal and future oriented.  The patient reports that their mood is stable. The patient denied having suicidal thoughts for more than 48 hours prior to discharge.  Patient denies having homicidal thoughts.  Patient denies having auditory hallucinations.  Patient denies any visual hallucinations or other symptoms of psychosis. The patient was motivated to continue taking medication with a goal of continued improvement in mental health.    The patient reports their target psychiatric symptoms of depression and SI responded well to the psychiatric medications, and the patient reports overall benefit other psychiatric hospitalization. Supportive psychotherapy was provided to the patient. The patient also participated in regular group therapy while hospitalized. Coping skills, problem solving as well as relaxation therapies were also part of the unit programming.   Labs were reviewed with the patient, and abnormal results were discussed with the patient.   The patient is able to verbalize their individual safety plan to this provider.   Behavioral Events: None   Restraints: None   Groups: Attended and participated   Medications Changes: As above   Sleep  Sleep:Sleep: Fair Number of Hours of Sleep: 5 Patient reports some interrupted sleep the night before discharge mainly secondary to feeling excited about discharge plan.  She confirmed that she feels she will have slept much better if she is at home given also change of environment.  Physical Findings: AIMS: Facial and Oral Movements Muscles of Facial Expression: None, normal Lips and Perioral Area: None, normal Jaw: None, normal Tongue: None, normal,Extremity Movements Upper (arms, wrists, hands, fingers): None, normal Lower (legs, knees, ankles, toes): None, normal, Trunk Movements Neck, shoulders, hips: None, normal,  Overall Severity Severity of abnormal movements (highest score from questions above): None, normal Incapacitation due to abnormal movements: None, normal Patient's awareness of abnormal movements (rate only patient's report): No Awareness, Dental Status Current problems with teeth and/or dentures?: No Does patient usually wear dentures?: No  CIWA:    COWS:  Musculoskeletal: Strength & Muscle Tone: within normal limits Gait & Station: normal Patient leans: N/A   Psychiatric Specialty Exam:  General Appearance: appears at stated age, fairly dressed and groomed  Behavior: pleasant and cooperative  Psychomotor Activity:No psychomotor agitation or retardation noted   Eye Contact: good Speech: normal amount, tone, volume and latency   Mood: euthymic Affect: congruent, pleasant and interactive  Thought Process: linear, goal directed, no circumstantial or tangential thought process noted, no racing thoughts or flight of ideas Descriptions of Associations: intact Thought Content: Hallucinations: denies AH, VH , does not appear responding to stimuli Delusions: No paranoia or other delusions noted Suicidal Thoughts: denies SI, intention, plan  Homicidal Thoughts: denies HI, intention, plan   Alertness/Orientation: alert and fully oriented  Insight: fair, improved Judgment: fair, improved  Memory: intact  Executive Functions  Concentration: intact  Attention Span: Fair Recall: intact Fund of Knowledge: fair   Assets  Assets: Manufacturing systems engineer; Desire for Improvement; Leisure Time; Social Support; Resilience    Physical Exam:  Physical Exam Vitals and nursing note reviewed.  Constitutional:      Appearance: Normal appearance.  HENT:     Head: Normocephalic and atraumatic.     Nose: Nose normal.  Eyes:     Pupils: Pupils are equal, round, and reactive to light.  Pulmonary:     Effort: Pulmonary effort is normal.  Musculoskeletal:        General: Normal  range of motion.     Cervical back: Normal range of motion.  Neurological:     General: No focal deficit present.     Mental Status: She is alert and oriented to person, place, and time. Mental status is at baseline.  Psychiatric:        Mood and Affect: Mood normal.        Behavior: Behavior normal.        Thought Content: Thought content normal.    Review of Systems  All other systems reviewed and are negative.  Blood pressure (!) 144/80, pulse 100, temperature 98.2 F (36.8 C), temperature source Oral, resp. rate 20, height 5\' 2"  (1.575 m), weight 109 kg, SpO2 99%. Body mass index is 43.97 kg/m.   Social History   Tobacco Use  Smoking Status Former   Types: Cigarettes  Smokeless Tobacco Never   Tobacco Cessation:  N/A, patient does not currently use tobacco products   Blood Alcohol level:  Lab Results  Component Value Date   ETH <10 01/27/2023    Metabolic Disorder Labs:  Lab Results  Component Value Date   HGBA1C 6.2 (H) 01/31/2023   MPG 131.24 01/31/2023   No results found for: "PROLACTIN" Lab Results  Component Value Date   CHOL 171 01/31/2023   TRIG 110 01/31/2023   HDL 36 (L) 01/31/2023   CHOLHDL 4.8 01/31/2023   VLDL 22 01/31/2023   LDLCALC 113 (H) 01/31/2023    See Psychiatric Specialty Exam and Suicide Risk Assessment completed by Attending Physician prior to discharge.  Discharge destination:  Home  Is patient on multiple antipsychotic therapies at discharge:  No   Has Patient had three or more failed trials of antipsychotic monotherapy by history:  No  Recommended Plan for Multiple Antipsychotic Therapies: NA  Discharge Instructions     Diet - low sodium heart healthy   Complete by: As directed    Discharge instructions   Complete by: As directed    I think your volume overload is related to missing your lasix dose  when you were first admitted as well as administered IV fluids.  At home, resume your lasix 40 mg twice daily.  Check  your weights every day.  If you weigh increases by 3 lbs in a day or 5 lbs in a week, take an extra dose of lasix for 3 days and call cardiology for follow up (additional instructions).  Take potassium with your lasix.    Your TSH (thyroid stimulating hormone) suggests your synthroid dose needs to be higher.  We'll increase your synthroid to 225 micrograms and have you follow up for repeat labs with endocrine in 4-6 weeks.   Call your cardiologist and PCP for appointments after discharge.   Return for new, recurrent, or worsening symptoms.    Please ask your PCP to request records from this hospitalization so they know what was done and what the next steps will be.   Increase activity slowly   Complete by: As directed       Allergies as of 02/02/2023       Reactions   Bee Venom Anaphylaxis   Penicillins Shortness Of Breath, Swelling, Rash, Other (See Comments)   Sulfa Antibiotics Hives, Rash        Medication List     STOP taking these medications    ALPRAZolam 0.5 MG tablet Commonly known as: XANAX   amLODipine 5 MG tablet Commonly known as: NORVASC   baclofen 10 MG tablet Commonly known as: LIORESAL   carvedilol 6.25 MG tablet Commonly known as: COREG   Cholecalciferol 125 MCG (5000 UT) capsule   EPINEPHrine 0.3 mg/0.3 mL Soaj injection Commonly known as: EPI-PEN   mirtazapine 30 MG tablet Commonly known as: REMERON       TAKE these medications      Indication  albuterol 108 (90 Base) MCG/ACT inhaler Commonly known as: VENTOLIN HFA Inhale 2 puffs into the lungs every 6 (six) hours as needed for wheezing  Indication: Asthma   ARIPiprazole 5 MG tablet Commonly known as: ABILIFY Take 1 tablet (5 mg total) by mouth daily.  Indication: MIXED BIPOLAR AFFECTIVE DISORDER   cetirizine 10 MG tablet Commonly known as: ZYRTEC Take 10 mg by mouth at bedtime as needed for allergies.  Indication: Hayfever   clonazePAM 0.25 MG disintegrating tablet Commonly  known as: KLONOPIN Take 1 tablet (0.25 mg total) by mouth 3 (three) times daily as needed (anxiety).  Indication: Feeling Anxious   dapagliflozin propanediol 10 MG Tabs tablet Commonly known as: Farxiga Take 1 tablet (10 mg total) by mouth daily before breakfast.  Indication: Cardiac Failure   furosemide 40 MG tablet Commonly known as: LASIX Take 1 tablet (40 mg total) by mouth 2 (two) times daily. Follow with cardiology or PCP for refills What changed:  how much to take when to take this additional instructions  Indication: Cardiac Failure   levonorgestrel 20 MCG/DAY Iud Commonly known as: MIRENA by Intrauterine route.  Indication: Birth Control Treatment   levothyroxine 75 MCG tablet Commonly known as: SYNTHROID Take 3 tablets (225 mcg total) by mouth daily before breakfast. Follow with your PCP for refills (you'll need repeat labs in 4-6 weeks with your PCP or endocrine doctor) What changed:  medication strength how much to take when to take this additional instructions  Indication: Underactive Thyroid   lisinopril 2.5 MG tablet Commonly known as: ZESTRIL Take 2.5 mg by mouth daily.  Indication: High Blood Pressure Disorder   melatonin 5 MG Tabs Take 1 tablet (5 mg total) by mouth at  bedtime.  Indication: Trouble Sleeping   omeprazole 40 MG capsule Commonly known as: PRILOSEC Take 40 mg by mouth daily.  Indication: Gastroesophageal Reflux Disease   Ozempic (0.25 or 0.5 MG/DOSE) 2 MG/3ML Sopn Generic drug: Semaglutide(0.25 or 0.5MG /DOS) Inject 0.5 mg into the skin once a week.  Indication: Type 2 Diabetes   potassium chloride SA 20 MEQ tablet Commonly known as: KLOR-CON M Take 1 tablet (20 mEq total) by mouth 2 (two) times daily. Twice a day with lasix.  If you take an extra dose of lasix, take an extra dose of potassium.  Follow with cardiology or your PCP for refills What changed:  how much to take when to take this additional instructions  Indication:  Low Amount of Potassium in the Blood   spironolactone 25 MG tablet Commonly known as: ALDACTONE Take 1 tablet (25 mg total) by mouth daily. Start taking on: February 03, 2023  Indication: High Blood Pressure Disorder   Symbicort 160-4.5 MCG/ACT inhaler Generic drug: budesonide-formoterol Inhale 2 puffs into the lungs daily as needed (wheezing, shortness of breath).  Indication: Asthma   traZODone 100 MG tablet Commonly known as: DESYREL Take 2 tablets (200 mg total) by mouth at bedtime.  Indication: Trouble Sleeping        Follow-up Information     [facility] Follow up on 02/24/2023.   Why: You have an appointment for medication management services on  02/24/23 at 6:20 pm.  This appointment will be Virtual via telehealth. Contact information: [address] [place] [address] [phone]         [facility] Go on 02/19/2023.   Why: You have an appointment for therapy services on 02/19/23 at 9:30 am.  This appointment will be held in person. Contact information: [address] [place] [address] [phone]                 Discharge recommendations:   Following the recommendations were provided to patient as per hospitalist team: Resume home Lasix 40 mg twice daily, continue low-sodium diet and continue potassium 20 meq with Lasix Patient to weigh herself daily if weight goes up 2 to 3 pounds in a day or 5 pounds in a week to take an extra dose of Lasix for 3 days Patient to restart Coreg at time of discharge, amlodipine was discontinued by patient's cardiologist few weeks prior to admission Synthroid dose was increased during hospital stay patient has an appointment with her endocrinologist in 4 to 6 weeks with recommendation to repeat lab work at that time for further management.   Patient was instructed and she agreed to have her fianc at home manage her medications to provide her medication every 2 to 3 days  this is to avoid any intentional or unintentional overdose. Activity: as tolerated  Diet: heart healthy  # It is recommended to the patient to continue psychiatric medications as prescribed, after discharge from the hospital.     # It is recommended to the patient to follow up with your outpatient psychiatric provider and PCP.   # It was discussed with the patient, the impact of alcohol, drugs, tobacco have been there overall psychiatric and medical wellbeing, and total abstinence from substance use was recommended the patient.ed.   # Prescriptions provided or sent directly to preferred pharmacy at discharge. Patient agreeable to plan. Given opportunity to ask questions. Appears to feel comfortable with discharge.    # In the event of worsening  symptoms, the patient is instructed to call the crisis hotline, 911 and or go to the nearest ED for appropriate evaluation and treatment of symptoms. To follow-up with primary care provider for other medical issues, concerns and or health care needs   # Patient was discharged home with a plan to follow up as noted above.  -Follow-up with outpatient primary care doctor and other specialists -for management of chronic medical disease, including: Patient was instructed and she agreed to call her primary care provider office and cardiologist office tomorrow morning to schedule follow-up appointment in the next 2 weeks to address medication management for congestive heart failure as well as medical problems.     Patient agrees with D/C instructions and plan.   The patient received suicide prevention pamphlet:  Yes Belongings returned:  Clothing and Valuables  Total Time Spent in Direct Patient Care:  I personally spent 45 minutes on the unit in direct patient care. The direct patient care time included face-to-face time with the patient, reviewing the patient's chart, communicating with other professionals, and coordinating care. Greater than 50% of  this time was spent in counseling or coordinating care with the patient regarding goals of hospitalization, psycho-education, and discharge planning needs.    SignedSarita Bottom, MD 02/02/2023, 8:21 AM

## 2023-03-20 ENCOUNTER — Encounter: Payer: Self-pay | Admitting: Internal Medicine

## 2023-03-20 ENCOUNTER — Ambulatory Visit: Payer: Medicaid Other | Attending: Internal Medicine | Admitting: Internal Medicine

## 2023-03-20 VITALS — BP 124/84 | HR 84 | Ht 62.0 in | Wt 254.0 lb

## 2023-03-20 DIAGNOSIS — R072 Precordial pain: Secondary | ICD-10-CM

## 2023-03-20 DIAGNOSIS — R002 Palpitations: Secondary | ICD-10-CM | POA: Diagnosis not present

## 2023-03-20 DIAGNOSIS — R635 Abnormal weight gain: Secondary | ICD-10-CM

## 2023-03-20 DIAGNOSIS — Z79899 Other long term (current) drug therapy: Secondary | ICD-10-CM

## 2023-03-20 DIAGNOSIS — R600 Localized edema: Secondary | ICD-10-CM

## 2023-03-20 DIAGNOSIS — E66811 Obesity, class 1: Secondary | ICD-10-CM

## 2023-03-20 DIAGNOSIS — E039 Hypothyroidism, unspecified: Secondary | ICD-10-CM

## 2023-03-20 DIAGNOSIS — R0609 Other forms of dyspnea: Secondary | ICD-10-CM

## 2023-03-20 DIAGNOSIS — R0602 Shortness of breath: Secondary | ICD-10-CM

## 2023-03-20 LAB — BASIC METABOLIC PANEL
BUN/Creatinine Ratio: 27 — ABNORMAL HIGH (ref 9–23)
BUN: 14 mg/dL (ref 6–24)
CO2: 23 mmol/L (ref 20–29)
Calcium: 9.5 mg/dL (ref 8.7–10.2)
Chloride: 101 mmol/L (ref 96–106)
Creatinine, Ser: 0.52 mg/dL — ABNORMAL LOW (ref 0.57–1.00)
Glucose: 115 mg/dL — ABNORMAL HIGH (ref 70–99)
Potassium: 4.7 mmol/L (ref 3.5–5.2)
Sodium: 138 mmol/L (ref 134–144)
eGFR: 119 mL/min/{1.73_m2} (ref >59)

## 2023-03-20 MED ORDER — METOPROLOL SUCCINATE ER 25 MG PO TB24: 25.0000 mg | ORAL_TABLET | Freq: Every day | ORAL | 3 refills | Status: DC

## 2023-03-20 NOTE — Patient Instructions (Addendum)
Medication Instructions:  STOP Carvedilol  START Metoprolol Succinate 25 mg daily *If you need a refill on your cardiac medications before your next appointment, please call your pharmacy*   Lab Work: BMP- today If you have labs (blood work) drawn today and your tests are completely normal, you will receive your results only by: MyChart Message (if you have MyChart) OR A paper copy in the mail If you have any lab test that is abnormal or we need to change your treatment, we will call you to review the results.   Testing/Procedures:   Your cardiac CT will be scheduled at one of the below locations:   [facility] [address] [place], [place] [address] [phone]  If scheduled at [facility], please arrive at the [facility] Entrance (Entrance C2) of [facility] 30 minutes prior to test start time. You can use the FREE valet parking offered at entrance C (encouraged to control the heart rate for the test)  Proceed to the [facility] Radiology Department (first floor) to check-in and test prep.  All radiology patients and guests should use entrance C2 at [facility], accessed from [facility], even though the hospital's physical address listed is [address]        Please follow these instructions carefully (unless otherwise directed):  An IV will be required for this test and Nitroglycerin will be given.    On the Night Before the Test: Be sure to Drink plenty of water. Do not consume any caffeinated/decaffeinated beverages or chocolate 12 hours prior to your test. Do not take any antihistamines 12 hours prior to your test.  On the Day of the Test: Drink plenty of water until 1 hour prior to the test. Do not eat any food 1 hour prior to test. You may take your regular medications prior to the test.  Take and extra Metoprolol Succinate the morning of the CT If you take  Furosemide/Hydrochlorothiazide/Spironolactone, please HOLD on the morning of the test. FEMALES- please wear underwire-free bra if available, avoid dresses & tight clothing   After the Test: Drink plenty of water. After receiving IV contrast, you may experience a mild flushed feeling. This is normal. On occasion, you may experience a mild rash up to 24 hours after the test. This is not dangerous. If this occurs, you can take Benadryl 25 mg and increase your fluid intake. If you experience trouble breathing, this can be serious. If it is severe call 911 IMMEDIATELY. If it is mild, please call our office. If you take any of these medications: Glipizide/Metformin, Avandament, Glucavance, please do not take 48 hours after completing test unless otherwise instructed.  We will call to schedule your test 2-4 weeks out understanding that some insurance companies will need an authorization prior to the service being performed.   For more information and frequently asked questions, please visit our website : [email or web address]  For non-scheduling related questions, please contact the cardiac imaging nurse navigator should you have any questions/concerns: Cardiac Imaging Nurse Navigators Direct Office Dial: [phone]   For scheduling needs, including cancellations and rescheduling, please call [place], [phone].    Follow-Up: At [facility], you and your health needs are our priority.  As part of our continuing mission to provide you with exceptional heart care, we have created designated Provider Care Teams.  These Care Teams include your primary Cardiologist (physician) and Advanced Practice Providers (APPs -  Physician Assistants and Nurse  Practitioners) who all work together to provide you with the care you need, when you need it.  We recommend signing up for the patient portal called "MyChart".  Sign up information is provided on this After Visit Summary.  MyChart is  used to connect with patients for Virtual Visits (Telemedicine).  Patients are able to view lab/test results, encounter notes, upcoming appointments, etc.  Non-urgent messages can be sent to your provider as well.   To learn more about what you can do with MyChart, go to [email or web address].    Your next appointment:   3 months  Provider:   Dr Jacques Navy

## 2023-03-20 NOTE — Progress Notes (Signed)
Cardiology Office Note:  .   Date:  03/20/2023  ID:  [name], DOB [birth year], MRN [id number] PCP: Wilfred Curtis, MD  [facility] Providers Cardiologist:  None    History of Present Illness: Cynthia Townsend is a 42 y.o. female.  Discussed the use of AI scribe software for clinical note transcription with the patient, who gave verbal consent to proceed.  History of Present Illness   The patient, with a history of gestational hypertension, postpartum preeclampsia with pulmonary edema, anxiety, and asthma, presents with ongoing fatigue and low blood pressure. The patient reports feeling tired throughout the day, regardless of blood pressure readings, which have been as low as 80/50 and as high as 130/85. The patient has been experiencing this fatigue since their thyroidectomy in February of this year.  The patient also reports sporadic chest pains described as a piercing pain that does not last long. Left chest. The patient has been experiencing worsening shortness of breath, which they attribute to fluid retention. The patient has been using CPAP for OSA and reports compliance with the treatment.  The patient has been treated for lower extremity edema with Lasix 60 mg daily, which they take as needed, approximately once or twice a week. The patient reports that their legs have significantly improved and are the best they've looked in a long time.  The patient has a family history of cardiac disease, with a sister who had a heart attack before 64, and both parents have had stents. The patient's cholesterol is above the desired level. LDL 113.        ROS: negative except per HPI above.  Studies Reviewed: .        Results   LABS TSH: 12 (01/2023) A1c: 6.2% (01/2023)  RADIOLOGY Calcium score: 0  DIAGNOSTIC REPORTS Cardiac monitor: Sinus rhythm with rare ectopy Echocardiogram: Normal     Risk Assessment/Calculations:             Physical Exam:   VS:  BP 124/84    Pulse 84   Ht 5\' 2"  (1.575 m)   Wt 254 lb (115.2 kg)   SpO2 97%   BMI 46.46 kg/m    Wt Readings from Last 3 Encounters:  03/20/23 254 lb (115.2 kg)  02/02/23 240 lb 6.4 oz (109 kg)  01/29/23 265 lb 3.4 oz (120.3 kg)     Physical Exam   CHEST: Lungs clear to auscultation. CARDIOVASCULAR: Heart sounds normal.     GEN: Well nourished, well developed in no acute distress NECK: No JVD; No carotid bruits CARDIAC: RRR, no murmurs, rubs, gallops RESPIRATORY:  Clear to auscultation without rales, wheezing or rhonchi  ABDOMEN: Soft, non-tender, non-distended EXTREMITIES:  No edema; No deformity   ASSESSMENT AND PLAN: .    1. Dyspnea on exertion   2. Weight gain   3. Palpitations   4. Acquired hypothyroidism   5. SOB (shortness of breath) on exertion   6. Obesity (BMI 30.0-34.9)   7. Precordial pain   8. Medication management   9. Bilateral lower extremity edema     Assessment and Plan    Hypertension History of postpartum preeclampsia. Currently experiencing low blood pressure readings at home (80/50, 90/40). On carvedilol for palpitations and tachycardia, which may be contributing to low blood pressure. -Switch carvedilol to metoprolol succinate 25mg  once daily to reduce blood pressure lowering effect while maintaining control of tachycardia. -Continue monitoring blood pressure at home.  Fatigue Chronic diastolic HF per prior cardiologist  Chronic, ongoing issue since thyroidectomy. Currently managed by endocrinology. Blood sugar levels not regularly monitored despite diagnosis of diabetes/prediabetes. -Continue follow-up with endocrinology. on farxiga for chronic diastolic HF. - on most recent echo, medial E' 12, likely normal diastolic function.   Hyperlipidemia Chest pain DOE Family history of early cardiac disease. Current cholesterol levels above target. -Order coronary CT scan to assess for plaque and blockage. -Take additional metoprolol on the morning of the scan  if heart rate is above 65.  Edema Improvement noted with discontinuation of amlodipine and use of Lasix 60mg  as needed. -Continue current management.  Obstructive Sleep Apnea Managed with CPAP, compliance encouraged. -Continue current management.  Follow-up in 3 months to assess response to medication changes and results of coronary CT scan.           Total time of encounter: 30 minutes total time of encounter, including 20 minutes spent in face-to-face patient care on the date of this encounter. This time includes coordination of care and counseling regarding above mentioned problem list. Remainder of non-face-to-face time involved reviewing chart documents/testing relevant to the patient encounter and documentation in the medical record. I have independently reviewed documentation from referring provider.   Weston Brass, MD, [facility]  [facility]

## 2023-03-31 ENCOUNTER — Ambulatory Visit (HOSPITAL_COMMUNITY): Payer: Medicaid Other

## 2023-04-10 ENCOUNTER — Telehealth (HOSPITAL_COMMUNITY): Payer: Self-pay | Admitting: *Deleted

## 2023-04-10 NOTE — Telephone Encounter (Signed)
Reaching out to patient to offer assistance regarding upcoming cardiac imaging study; pt verbalizes understanding of appt date/time, parking situation and where to check in, pre-test NPO status and medications ordered, and verified current allergies; name and call back number provided for further questions should they arise  Larey Brick RN Navigator Cardiac Imaging [facility] [phone] office [phone] cell  Patient to take 50mg  metoprolol succinate two hours prior to her cardiac CT scan. She is aware to arrive at 8:30 AM.

## 2023-04-11 ENCOUNTER — Ambulatory Visit (HOSPITAL_COMMUNITY)
Admission: RE | Admit: 2023-04-11 | Discharge: 2023-04-11 | Disposition: A | Discharge status: Home / Self Care | Payer: Medicaid Other | Source: Ambulatory Visit | Attending: Internal Medicine | Admitting: Internal Medicine

## 2023-04-11 ENCOUNTER — Telehealth: Payer: Self-pay

## 2023-04-11 ENCOUNTER — Encounter: Payer: Self-pay | Admitting: *Deleted

## 2023-04-11 ENCOUNTER — Other Ambulatory Visit (HOSPITAL_COMMUNITY): Payer: Self-pay | Admitting: *Deleted

## 2023-04-11 ENCOUNTER — Encounter (HOSPITAL_COMMUNITY): Payer: Self-pay

## 2023-04-11 DIAGNOSIS — R0609 Other forms of dyspnea: Secondary | ICD-10-CM | POA: Diagnosis present

## 2023-04-11 MED ORDER — IVABRADINE HCL 7.5 MG PO TABS: ORAL_TABLET | ORAL | 0 refills | Status: DC

## 2023-04-11 MED ORDER — DILTIAZEM HCL 25 MG/5ML IV SOLN
INTRAVENOUS | Status: AC
Start: 2023-04-11 — End: ?
  Filled 2023-04-11: qty 5

## 2023-04-11 MED ORDER — NITROGLYCERIN 0.4 MG SL SUBL: 0.8000 mg | SUBLINGUAL_TABLET | Freq: Once | SUBLINGUAL | Status: DC

## 2023-04-11 MED ORDER — DILTIAZEM HCL 25 MG/5ML IV SOLN
5.0000 mg | Freq: Once | INTRAVENOUS | Status: AC
  Administered 2023-04-11: 5 mg via INTRAVENOUS

## 2023-04-11 NOTE — Telephone Encounter (Addendum)
Called pt on 11/1. We discussed new plan for coronary CTA. Order for Ivabradine already sent in via Imaging RN. Pt will take lorazapam and 15mg  of ivabradine (2 hours prior to CCTA). Pt verbalizes understanding.     ----- Message from Parke Poisson sent at 04/11/2023 10:37 AM EDT ----- Le'ts make sure she gets 15 mg ivabradine and takes her lorazepam prior to the scan on nov 15. [place] ----- Message ----- From: Quintella Reichert, MD Sent: 04/11/2023   9:04 AM EDT To: Parke Poisson, MD  Patient presented for coronary CTA but heart rate in the 80's despite 50mg  Lopressor.  Given Cardizem IV with no significant change in HR. Patient was very anxious about the study and usually takes Lorazepam but did not take it.    CT cancelled.  Please reschedule and consider addition of Ivabradine prior to study and encourage patient to take her Lorazepam for the study

## 2023-04-11 NOTE — Progress Notes (Signed)
Patient ID: Cynthia Townsend, female   DOB: 11-30-1980, 42 y.o.   MRN: [id number] pt ct heart cancelled per Dr. Armanda Magic heart rate 78 bp 106/65 after 5mg  cardizem pt states" very anxious"

## 2023-04-16 ENCOUNTER — Encounter: Payer: Self-pay | Admitting: Internal Medicine

## 2023-04-17 MED ORDER — METOPROLOL SUCCINATE ER 50 MG PO TB24: 50.0000 mg | ORAL_TABLET | Freq: Every day | ORAL | 3 refills | Status: DC

## 2023-04-24 ENCOUNTER — Encounter: Payer: Self-pay | Admitting: Internal Medicine

## 2023-04-25 ENCOUNTER — Ambulatory Visit (HOSPITAL_COMMUNITY): Payer: Medicaid Other

## 2023-04-28 ENCOUNTER — Telehealth (HOSPITAL_COMMUNITY): Payer: Self-pay | Admitting: *Deleted

## 2023-04-28 NOTE — Telephone Encounter (Signed)
Reaching out to patient to offer assistance regarding upcoming cardiac imaging study; pt verbalizes understanding of appt date/time, parking situation and where to check in, pre-test NPO status and medications ordered, and verified current allergies; name and call back number provided for further questions should they arise  Larey Brick RN Navigator Cardiac Imaging [facility] [phone] office [phone] cell  Patient to take 15mg  ivabradine two hours  prior to her cardiac CT. Patient made aware she will need a driver if she is taking any anti-anxiety medications such as lorazapam.

## 2023-04-29 ENCOUNTER — Ambulatory Visit (HOSPITAL_COMMUNITY)
Admission: RE | Admit: 2023-04-29 | Discharge: 2023-04-29 | Disposition: A | Discharge status: Home / Self Care | Payer: Medicaid Other | Source: Ambulatory Visit | Attending: Internal Medicine | Admitting: Internal Medicine

## 2023-04-29 DIAGNOSIS — R072 Precordial pain: Secondary | ICD-10-CM | POA: Diagnosis not present

## 2023-04-29 DIAGNOSIS — R0609 Other forms of dyspnea: Secondary | ICD-10-CM | POA: Diagnosis present

## 2023-04-29 MED ORDER — METOPROLOL TARTRATE 5 MG/5ML IV SOLN
5.0000 mg | Freq: Once | INTRAVENOUS | Status: AC
  Administered 2023-04-29: 5 mg via INTRAVENOUS

## 2023-04-29 MED ORDER — DILTIAZEM HCL 25 MG/5ML IV SOLN
5.0000 mg | Freq: Once | INTRAVENOUS | Status: AC
  Administered 2023-04-29: 5 mg via INTRAVENOUS

## 2023-04-29 MED ORDER — IOHEXOL 350 MG/ML SOLN
100.0000 mL | Freq: Once | INTRAVENOUS | Status: AC | PRN
  Administered 2023-04-29: 100 mL via INTRAVENOUS

## 2023-04-29 MED ORDER — DILTIAZEM HCL 25 MG/5ML IV SOLN
INTRAVENOUS | Status: AC
Start: 2023-04-29 — End: ?
  Filled 2023-04-29: qty 5

## 2023-04-29 MED ORDER — METOPROLOL TARTRATE 5 MG/5ML IV SOLN
INTRAVENOUS | Status: AC
  Filled 2023-04-29: qty 10

## 2023-04-29 MED ORDER — NITROGLYCERIN 0.4 MG SL SUBL
0.8000 mg | SUBLINGUAL_TABLET | Freq: Once | SUBLINGUAL | Status: AC
  Administered 2023-04-29: 0.8 mg via SUBLINGUAL

## 2023-04-29 MED ORDER — NITROGLYCERIN 0.4 MG SL SUBL
SUBLINGUAL_TABLET | SUBLINGUAL | Status: AC
Start: 2023-04-29 — End: ?
  Filled 2023-04-29: qty 2

## 2023-06-27 ENCOUNTER — Ambulatory Visit: Payer: Medicaid Other | Admitting: Internal Medicine

## 2023-09-30 ENCOUNTER — Ambulatory Visit: Payer: Medicaid Other | Admitting: Internal Medicine

## 2023-10-29 ENCOUNTER — Other Ambulatory Visit: Payer: Self-pay

## 2023-10-29 DIAGNOSIS — E039 Hypothyroidism, unspecified: Secondary | ICD-10-CM

## 2023-10-29 DIAGNOSIS — R002 Palpitations: Secondary | ICD-10-CM

## 2023-10-29 DIAGNOSIS — I10 Essential (primary) hypertension: Secondary | ICD-10-CM

## 2023-10-29 DIAGNOSIS — I5031 Acute diastolic (congestive) heart failure: Secondary | ICD-10-CM

## 2023-10-29 DIAGNOSIS — I509 Heart failure, unspecified: Secondary | ICD-10-CM

## 2023-10-29 DIAGNOSIS — R0609 Other forms of dyspnea: Secondary | ICD-10-CM

## 2023-10-29 DIAGNOSIS — R635 Abnormal weight gain: Secondary | ICD-10-CM

## 2023-10-29 MED ORDER — DAPAGLIFLOZIN PROPANEDIOL 10 MG PO TABS: 10.0000 mg | ORAL_TABLET | Freq: Every day | ORAL | 5 refills | Status: DC

## 2023-11-24 ENCOUNTER — Emergency Department (HOSPITAL_BASED_OUTPATIENT_CLINIC_OR_DEPARTMENT_OTHER)

## 2023-11-24 ENCOUNTER — Encounter (HOSPITAL_BASED_OUTPATIENT_CLINIC_OR_DEPARTMENT_OTHER): Payer: Self-pay | Admitting: Emergency Medicine

## 2023-11-24 ENCOUNTER — Emergency Department (HOSPITAL_BASED_OUTPATIENT_CLINIC_OR_DEPARTMENT_OTHER)
Admission: EM | Admit: 2023-11-24 | Discharge: 2023-11-24 | Disposition: A | Discharge status: Home / Self Care | Attending: Emergency Medicine | Admitting: Emergency Medicine

## 2023-11-24 ENCOUNTER — Other Ambulatory Visit: Payer: Self-pay

## 2023-11-24 DIAGNOSIS — Y92410 Unspecified street and highway as the place of occurrence of the external cause: Secondary | ICD-10-CM | POA: Insufficient documentation

## 2023-11-24 DIAGNOSIS — S0990XA Unspecified injury of head, initial encounter: Secondary | ICD-10-CM | POA: Diagnosis not present

## 2023-11-24 DIAGNOSIS — S161XXA Strain of muscle, fascia and tendon at neck level, initial encounter: Secondary | ICD-10-CM | POA: Insufficient documentation

## 2023-11-24 DIAGNOSIS — S39012A Strain of muscle, fascia and tendon of lower back, initial encounter: Secondary | ICD-10-CM | POA: Diagnosis not present

## 2023-11-24 DIAGNOSIS — M25552 Pain in left hip: Secondary | ICD-10-CM | POA: Diagnosis not present

## 2023-11-24 DIAGNOSIS — S199XXA Unspecified injury of neck, initial encounter: Secondary | ICD-10-CM | POA: Diagnosis present

## 2023-11-24 DIAGNOSIS — Z79899 Other long term (current) drug therapy: Secondary | ICD-10-CM | POA: Diagnosis not present

## 2023-11-24 LAB — PREGNANCY, URINE: Preg Test, Ur: NEGATIVE

## 2023-11-24 MED ORDER — IBUPROFEN 600 MG PO TABS: 600.0000 mg | ORAL_TABLET | Freq: Three times a day (TID) | ORAL | 0 refills | Status: AC | PRN

## 2023-11-24 MED ORDER — METHOCARBAMOL 500 MG PO TABS: 500.0000 mg | ORAL_TABLET | Freq: Two times a day (BID) | ORAL | 0 refills | Status: DC | PRN
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
## 2023-11-24 NOTE — ED Notes (Signed)
 Pt c/o blurry vision. RN and Dr. Randal Bury notified

## 2024-03-02 ENCOUNTER — Other Ambulatory Visit: Payer: Self-pay

## 2024-03-03 MED ORDER — SPIRONOLACTONE 25 MG PO TABS: 25.0000 mg | ORAL_TABLET | Freq: Every day | ORAL | 0 refills | Status: DC

## 2024-03-26 ENCOUNTER — Other Ambulatory Visit: Payer: Self-pay | Admitting: Internal Medicine

## 2024-04-13 ENCOUNTER — Encounter: Payer: Self-pay | Admitting: Internal Medicine

## 2024-04-13 ENCOUNTER — Other Ambulatory Visit (HOSPITAL_COMMUNITY): Payer: Self-pay

## 2024-04-13 DIAGNOSIS — R002 Palpitations: Secondary | ICD-10-CM

## 2024-04-13 DIAGNOSIS — R0609 Other forms of dyspnea: Secondary | ICD-10-CM

## 2024-04-13 DIAGNOSIS — I5031 Acute diastolic (congestive) heart failure: Secondary | ICD-10-CM

## 2024-04-13 DIAGNOSIS — I509 Heart failure, unspecified: Secondary | ICD-10-CM

## 2024-04-13 DIAGNOSIS — I10 Essential (primary) hypertension: Secondary | ICD-10-CM

## 2024-04-13 DIAGNOSIS — R635 Abnormal weight gain: Secondary | ICD-10-CM

## 2024-04-13 DIAGNOSIS — E039 Hypothyroidism, unspecified: Secondary | ICD-10-CM

## 2024-04-14 ENCOUNTER — Other Ambulatory Visit (HOSPITAL_COMMUNITY): Payer: Self-pay

## 2024-04-14 ENCOUNTER — Other Ambulatory Visit: Payer: Self-pay

## 2024-04-14 MED ORDER — LISINOPRIL 2.5 MG PO TABS
2.5000 mg | ORAL_TABLET | Freq: Every day | ORAL | 5 refills | Status: AC
  Filled 2024-07-08: qty 90, 90d supply, fill #0

## 2024-04-14 MED ORDER — DAPAGLIFLOZIN PROPANEDIOL 10 MG PO TABS
10.0000 mg | ORAL_TABLET | Freq: Every day | ORAL | 0 refills | Status: AC
  Filled 2024-04-14: qty 90, 90d supply, fill #0

## 2024-04-14 MED ORDER — MOUNJARO 12.5 MG/0.5ML ~~LOC~~ SOAJ
12.5000 mg | SUBCUTANEOUS | 2 refills | Status: AC
  Filled 2024-04-23: qty 2, 28d supply, fill #0

## 2024-04-14 MED ORDER — MOUNJARO 12.5 MG/0.5ML ~~LOC~~ SOAJ
12.5000 mg | SUBCUTANEOUS | 2 refills | Status: AC
  Filled 2024-04-14: qty 2, 28d supply, fill #0

## 2024-04-14 MED ORDER — OMEPRAZOLE 40 MG PO CPDR
40.0000 mg | DELAYED_RELEASE_CAPSULE | Freq: Every day | ORAL | 5 refills | Status: AC
  Filled 2024-07-08: qty 90, 90d supply, fill #0

## 2024-04-14 MED ORDER — ALBUTEROL SULFATE HFA 108 (90 BASE) MCG/ACT IN AERS: 2.0000 | INHALATION_SPRAY | RESPIRATORY_TRACT | 5 refills | Status: AC | PRN

## 2024-04-14 MED ORDER — MIRTAZAPINE 45 MG PO TABS
45.0000 mg | ORAL_TABLET | Freq: Every day | ORAL | 0 refills | Status: AC
  Filled 2024-04-14: qty 90, 90d supply, fill #0

## 2024-04-14 MED ORDER — METOPROLOL SUCCINATE ER 50 MG PO TB24
50.0000 mg | ORAL_TABLET | Freq: Every day | ORAL | 0 refills | Status: AC
  Filled 2024-04-14: qty 90, 90d supply, fill #0

## 2024-04-14 MED ORDER — LEVOTHYROXINE SODIUM 200 MCG PO TABS
200.0000 ug | ORAL_TABLET | Freq: Every morning | ORAL | 1 refills | Status: AC
  Filled 2024-04-14: qty 90, 90d supply, fill #0

## 2024-04-14 MED ORDER — MIRTAZAPINE 30 MG PO TABS: 60.0000 mg | ORAL_TABLET | Freq: Every day | ORAL | 0 refills | Status: DC

## 2024-04-14 MED ORDER — TRAZODONE HCL 150 MG PO TABS
250.0000 mg | ORAL_TABLET | Freq: Every evening | ORAL | 0 refills | Status: DC | PRN
  Filled 2024-04-14: qty 135, 90d supply, fill #0
  Filled 2024-04-14 (×2): qty 150, 90d supply, fill #0

## 2024-04-14 MED ORDER — SPIRONOLACTONE 25 MG PO TABS
25.0000 mg | ORAL_TABLET | Freq: Every day | ORAL | 0 refills | Status: AC
  Filled 2024-04-14: qty 90, 90d supply, fill #0

## 2024-04-14 MED ORDER — LEVOTHYROXINE SODIUM 200 MCG PO TABS
200.0000 ug | ORAL_TABLET | Freq: Every morning | ORAL | 1 refills | Status: AC
  Filled 2024-04-23: qty 90, 90d supply, fill #0

## 2024-04-15 ENCOUNTER — Encounter: Payer: Self-pay | Admitting: Internal Medicine

## 2024-04-15 DIAGNOSIS — R600 Localized edema: Secondary | ICD-10-CM

## 2024-04-15 DIAGNOSIS — R0602 Shortness of breath: Secondary | ICD-10-CM

## 2024-04-16 ENCOUNTER — Emergency Department (HOSPITAL_BASED_OUTPATIENT_CLINIC_OR_DEPARTMENT_OTHER)
Admission: EM | Admit: 2024-04-16 | Discharge: 2024-04-16 | Disposition: A | Discharge status: Home / Self Care | Attending: Emergency Medicine | Admitting: Emergency Medicine

## 2024-04-16 ENCOUNTER — Encounter (HOSPITAL_BASED_OUTPATIENT_CLINIC_OR_DEPARTMENT_OTHER): Payer: Self-pay

## 2024-04-16 ENCOUNTER — Other Ambulatory Visit: Payer: Self-pay

## 2024-04-16 ENCOUNTER — Telehealth

## 2024-04-16 ENCOUNTER — Telehealth: Admitting: Emergency Medicine

## 2024-04-16 DIAGNOSIS — J020 Streptococcal pharyngitis: Secondary | ICD-10-CM

## 2024-04-16 DIAGNOSIS — R112 Nausea with vomiting, unspecified: Secondary | ICD-10-CM | POA: Insufficient documentation

## 2024-04-16 DIAGNOSIS — J029 Acute pharyngitis, unspecified: Secondary | ICD-10-CM | POA: Diagnosis not present

## 2024-04-16 LAB — RESP PANEL BY RT-PCR (RSV, FLU A&B, COVID)  RVPGX2
Influenza A by PCR: NEGATIVE
Influenza B by PCR: NEGATIVE
Resp Syncytial Virus by PCR: NEGATIVE
SARS Coronavirus 2 by RT PCR: NEGATIVE

## 2024-04-16 LAB — CBC WITH DIFFERENTIAL/PLATELET
Abs Immature Granulocytes: 0.19 K/uL — ABNORMAL HIGH (ref 0.00–0.07)
Basophils Absolute: 0.1 K/uL (ref 0.0–0.1)
Basophils Relative: 0 %
Eosinophils Absolute: 0.4 K/uL (ref 0.0–0.5)
Eosinophils Relative: 3 %
HCT: 38.8 % (ref 36.0–46.0)
Hemoglobin: 13 g/dL (ref 12.0–15.0)
Immature Granulocytes: 2 %
Lymphocytes Relative: 21 %
Lymphs Abs: 2.8 K/uL (ref 0.7–4.0)
MCH: 28.3 pg (ref 26.0–34.0)
MCHC: 33.5 g/dL (ref 30.0–36.0)
MCV: 84.5 fL (ref 80.0–100.0)
Monocytes Absolute: 0.7 K/uL (ref 0.1–1.0)
Monocytes Relative: 5 %
Neutro Abs: 8.8 K/uL — ABNORMAL HIGH (ref 1.7–7.7)
Neutrophils Relative %: 69 %
Platelets: 377 K/uL (ref 150–400)
RBC: 4.59 MIL/uL (ref 3.87–5.11)
RDW: 13.2 % (ref 11.5–15.5)
WBC: 12.9 K/uL — ABNORMAL HIGH (ref 4.0–10.5)
nRBC: 0 % (ref 0.0–0.2)

## 2024-04-16 LAB — COMPREHENSIVE METABOLIC PANEL WITH GFR
ALT: 34 U/L (ref 0–44)
AST: 20 U/L (ref 15–41)
Albumin: 4 g/dL (ref 3.5–5.0)
Alkaline Phosphatase: 112 U/L (ref 38–126)
Anion gap: 10 (ref 5–15)
BUN: 18 mg/dL (ref 6–20)
CO2: 28 mmol/L (ref 22–32)
Calcium: 9.1 mg/dL (ref 8.9–10.3)
Chloride: 101 mmol/L (ref 98–111)
Creatinine, Ser: 0.59 mg/dL (ref 0.44–1.00)
GFR, Estimated: >60 mL/min (ref >60)
Glucose, Bld: 98 mg/dL (ref 70–99)
Potassium: 3.8 mmol/L (ref 3.5–5.1)
Sodium: 139 mmol/L (ref 135–145)
Total Bilirubin: 0.3 mg/dL (ref 0.0–1.2)
Total Protein: 7 g/dL (ref 6.5–8.1)

## 2024-04-16 LAB — LIPASE, BLOOD: Lipase: 16 U/L (ref 11–51)

## 2024-04-16 LAB — PREGNANCY, URINE: Preg Test, Ur: NEGATIVE

## 2024-04-16 LAB — GROUP A STREP BY PCR: Group A Strep by PCR: NOT DETECTED

## 2024-04-16 MED ORDER — ONDANSETRON HCL 4 MG PO TABS: 4.0000 mg | ORAL_TABLET | Freq: Four times a day (QID) | ORAL | 0 refills | Status: DC

## 2024-04-16 MED ORDER — SODIUM CHLORIDE 0.9 % IV BOLUS
1000.0000 mL | Freq: Once | INTRAVENOUS | Status: AC
  Administered 2024-04-16: 1000 mL via INTRAVENOUS

## 2024-04-16 MED ORDER — ONDANSETRON 4 MG PO TBDP
8.0000 mg | ORAL_TABLET | Freq: Once | ORAL | Status: AC
  Administered 2024-04-16: 8 mg via ORAL
  Filled 2024-04-16: qty 2

## 2024-04-16 MED ORDER — ONDANSETRON HCL 4 MG/2ML IJ SOLN
4.0000 mg | Freq: Once | INTRAMUSCULAR | Status: AC
  Administered 2024-04-16: 4 mg via INTRAVENOUS
  Filled 2024-04-16: qty 2

## 2024-04-16 MED ORDER — ONDANSETRON 4 MG PO TBDP
8.0000 mg | ORAL_TABLET | Freq: Once | ORAL | Status: AC
Start: 2024-04-16 — End: 2024-04-16
  Administered 2024-04-16: 8 mg via ORAL
  Filled 2024-04-16: qty 2

## 2024-04-16 NOTE — ED Notes (Signed)
 Reviewed AVS/discharge instruction with patient. Time allotted for and all questions answered. Patient is agreeable for d/c and escorted to ed exit by staff.

## 2024-04-16 NOTE — ED Notes (Signed)
 Patient reports throwing up water for PO challenge. Provider notified, reordered zofran . Plan to rePO challenger prior to D/C

## 2024-04-16 NOTE — ED Provider Notes (Addendum)
  EMERGENCY DEPARTMENT AT [facility] Provider Note   CSN: [id number] Arrival date & time: 04/16/24  9086     Patient presents with: Sore Throat  HPI Cynthia Townsend is a 43 y.o. female presenting for sore throat and vomiting.  She states that 8 days ago she was diagnosed with strep.  She was started on azithromycin .  She had a persistent fever and went back to urgent care and was started on clindamycin .  She states that every time she takes the clindamycin  she vomits.  Last vomited this morning.  Denies any abdominal pain.  Had a normal bowel movement yesterday.  Denies urinary or vaginal symptoms.  Reports subjective fever at home.  Was advised to come here after an e-visit today for her vomiting for concern for dehydration.  She also states that her throat still feels scratchy but pain is much improved since she was on antibiotics.    Sore Throat       Prior to Admission medications   Medication Sig Start Date End Date Taking? Authorizing Provider  clindamycin  (CLEOCIN ) 300 MG capsule Take 300 mg by mouth every 8 (eight) hours. 04/11/24  Yes [provider]  lidocaine  (XYLOCAINE ) 2 % solution Use as directed 10 mLs in the mouth or throat 3 (three) times daily as needed. 04/11/24  Yes [provider]  ondansetron  (ZOFRAN ) 4 MG tablet Take 1 tablet (4 mg total) by mouth every 6 (six) hours. 04/16/24  Yes Makensey Rego K, PA-C  albuterol  (VENTOLIN  HFA) 108 (90 Base) MCG/ACT inhaler Inhale 2 puffs into the lungs every 6 (six) hours as needed for wheezing 08/22/21     albuterol  (VENTOLIN  HFA) 108 (90 Base) MCG/ACT inhaler Inhale 2 puffs into the lungs every 4 (four) hours as needed for wheezing or shortness of breath. 06/22/23     ALPRAZolam  (XANAX ) 0.5 MG tablet Take 0.5 mg by mouth 3 (three) times daily.    [provider]  cetirizine (ZYRTEC) 10 MG tablet Take 10 mg by mouth at bedtime as needed for allergies.    [provider]   clotrimazole-betamethasone  (LOTRISONE) cream Apply 1 Application topically 2 (two) times daily.    [provider]  dapagliflozin  propanediol (FARXIGA ) 10 MG TABS tablet Take 1 tablet (10 mg total) by mouth daily before breakfast. 04/14/24   Loni Soyla LABOR, MD  furosemide  (LASIX ) 40 MG tablet Take 1 tablet (40 mg total) by mouth 2 (two) times daily. Follow with cardiology or PCP for refills 02/02/23 03/04/23  Perri LABOR Meliton Mickey., MD  ibuprofen  (ADVIL ) 600 MG tablet Take 1 tablet (600 mg total) by mouth every 8 (eight) hours as needed. 11/24/23   Butler, Michael C, MD  levonorgestrel (MIRENA) 20 MCG/DAY IUD by Intrauterine route. 12/04/20   [provider]  levothyroxine  (SYNTHROID ) 200 MCG tablet Take 1 tablet (200 mcg total) by mouth every morning. 04/14/24     levothyroxine  (SYNTHROID ) 200 MCG tablet Take 1 tablet (200 mcg total) by mouth in the morning. 01/23/24     lisinopril  (ZESTRIL ) 2.5 MG tablet Take 2.5 mg by mouth daily. 01/06/23 01/06/24  [provider]  lisinopril  (ZESTRIL ) 2.5 MG tablet Take 1 tablet (2.5 mg total) by mouth daily. 01/30/24     metoprolol  succinate (TOPROL -XL) 50 MG 24 hr tablet Take 1 tablet (50 mg total) by mouth daily. Take with or immediately following a meal. 04/14/24 07/13/24  Loni Soyla LABOR, MD  mirtazapine  (REMERON ) 45 MG tablet Take 1 tablet (45 mg  total) by mouth at bedtime. 04/14/24     MOUNJARO 12.5 MG/0.5ML Pen Inject 12.5 mg into the skin once a week. 04/14/24     omeprazole  (PRILOSEC) 40 MG capsule Take 40 mg by mouth daily. 12/07/22   [provider]  omeprazole  (PRILOSEC) 40 MG capsule Take 1 capsule (40 mg total) by mouth daily. 04/10/24     potassium chloride  SA (KLOR-CON  M) 20 MEQ tablet Take 1 tablet (20 mEq total) by mouth 2 (two) times daily. Twice a day with lasix .  If you take an extra dose of lasix , take an extra dose of potassium.  Follow with cardiology or your PCP for refills 02/01/23 03/03/23  Perri DELENA Meliton Mickey., MD  spironolactone  (ALDACTONE ) 25 MG tablet Take 1 tablet (25 mg total) by mouth daily. 04/14/24   Acharya, Gayatri A, MD  SYMBICORT  160-4.5 MCG/ACT inhaler Inhale 2 puffs into the lungs daily as needed (wheezing, shortness of breath). 10/09/22   [provider]  tirzepatide CLOYDE) 12.5 MG/0.5ML Pen Inject 12.5 mg into the skin once a week. 03/30/24     traZODone  (DESYREL ) 100 MG tablet Take 2 tablets (200 mg total) by mouth at bedtime. 02/02/23   Evelena Figures, MD    Allergies: Bee venom, Penicillins, and Sulfa antibiotics    Review of Systems See HPI  Updated Vital Signs BP 127/70 (BP Location: Right Arm)   Pulse 81   Temp 98.3 F (36.8 C) (Oral)   Resp 16   SpO2 100%   Physical Exam Vitals and nursing note reviewed.  HENT:     Head: Normocephalic and atraumatic.     Mouth/Throat:     Mouth: Mucous membranes are moist.     Pharynx: Oropharynx is clear. Uvula midline. No pharyngeal swelling.     Tonsils: No tonsillar exudate or tonsillar abscesses.     Comments: Mild  erythema in the posterior oropharynx Eyes:     General:        Right eye: No discharge.        Left eye: No discharge.     Conjunctiva/sclera: Conjunctivae normal.  Cardiovascular:     Rate and Rhythm: Normal rate and regular rhythm.     Pulses: Normal pulses.     Heart sounds: Normal heart sounds.  Pulmonary:     Effort: Pulmonary effort is normal.     Breath sounds: Normal breath sounds.  Abdominal:     General: Abdomen is flat.     Palpations: Abdomen is soft.  Musculoskeletal:     Cervical back: Full passive range of motion without pain, normal range of motion and neck supple. No edema or erythema.  Lymphadenopathy:     Cervical: No cervical adenopathy.  Skin:    General: Skin is warm and dry.     Comments: Normal skin turgor  Neurological:     General: No focal deficit present.  Psychiatric:        Mood and Affect: Mood normal.     (all labs ordered are listed, but only  abnormal results are displayed) Labs Reviewed  CBC WITH DIFFERENTIAL/PLATELET - Abnormal; Notable for the following components:      Result Value   WBC 12.9 (*)    Neutro Abs 8.8 (*)    Abs Immature Granulocytes 0.19 (*)    All other components within normal limits  GROUP A STREP BY PCR  RESP PANEL BY RT-PCR (RSV, FLU A&B, COVID)  [id number]  PREGNANCY, URINE  COMPREHENSIVE METABOLIC PANEL WITH  GFR  LIPASE, BLOOD  URINALYSIS, ROUTINE W REFLEX MICROSCOPIC    EKG: EKG Interpretation Date/Time:  Friday April 16 2024 12:37:45 EST Ventricular Rate:  77 PR Interval:  164 QRS Duration:  88 QT Interval:  404 QTC Calculation: 458 R Axis:   51  Text Interpretation: Age not entered, assumed to be  43 years old for purpose of ECG interpretation Sinus rhythm Normal ECG Confirmed by Doretha Folks ([id number]) on 04/16/2024 12:49:33 PM  Radiology: No results found.   Procedures   Medications Ordered in the ED  ondansetron  (ZOFRAN -ODT) disintegrating tablet 8 mg (8 mg Oral Given 04/16/24 1015)  ondansetron  (ZOFRAN -ODT) disintegrating tablet 8 mg (8 mg Oral Given 04/16/24 1144)  sodium chloride  0.9 % bolus 1,000 mL (0 mLs Intravenous Stopped 04/16/24 1338)  ondansetron  (ZOFRAN ) injection 4 mg (4 mg Intravenous Given 04/16/24 1248)                                    Medical Decision Making Amount and/or Complexity of Data Reviewed Labs: ordered.  Risk Prescription drug management.   43 year old well-appearing female presenting for persistent sore throat and vomiting.  Exam was unremarkable.  She looks well, nontoxic and clinically hydrated.  Her vitals are normal here. Respiratory PCR and strep PCR both negative.  On reassessment there had been no vomiting and attempted to fluid challenge her but she subsequently vomited after fluid challenge. This prompted further evaluation with labs and also gave her a bolus of fluid and IV Zofran .  Labs largely unremarkable, white count was slightly  elevated at 12.9.  Has not had abdominal pain during the encounter and abdominal exam remains benign.  After fluids and antiemetics able to fluid challenge and she tolerated well without complication. Feel that her symptoms could be an adverse reaction to clindamycin .  Advised her to discontinue the clindamycin  as there is no evidence that she actually has strep throat and was effectively treated with azithromycin .  Advised to follow-up PCP.  Discussed return precautions.  Sent Zofran  to her pharmacy.  Discharged good condition.     Final diagnoses:  Nausea and vomiting, unspecified vomiting type    ED Discharge Orders          Ordered    ondansetron  (ZOFRAN ) 4 MG tablet  Every 6 hours        04/16/24 1116              Raza Bayless K, PA-C 04/16/24 1411    Doretha Folks, MD 04/21/24 1636

## 2024-04-16 NOTE — Progress Notes (Signed)
 Based on what you shared with me, particularly inability to keep the antibiotic down in the setting of strep throat, you are at risk of dehydration and insufficient treatment, I feel your condition warrants further evaluation as soon as possible at an Emergency department. You may need fluids and/or an IV antibiotic.   NOTE: There will be NO CHARGE for this eVisit   If you are having a true medical emergency please call 911.      Emergency Department-[facility]  Get Driving Directions  [phone]  [address]  [place], [place] [address]  Open 24/7/365      [facility] Emergency Department at [facility]  Get Driving Directions  [address]  [place], [place] [address]  Open 24/7/365    Emergency Department- [facility]  Get Driving Directions  [phone]  [address]  [place], [place] [address]  Open 24/7/365      [facility]  Get Driving Directions  [phone]  [address]  [place], [place] [address]  Open 24/7/365    [facility]  Emergency Department- [facility]  Get Driving Directions  [phone]  [address]  [place], [place] [address]  Open 24/7/365    [place]  Emergency Department- [facility]  Get Driving Directions  [address]  [place], [place] [address]  Open 24/7/365    [facility]  Emergency Department- [facility]  Get Driving Directions  [phone]  [address]  [place], [place] [address]  Open 24/7/365

## 2024-04-16 NOTE — ED Triage Notes (Signed)
 Patient reports testing positive for strep. She was given a Zpack. She felt better for a little bit but then her fevers came back. She was then given clindamycin . She says she has thrown up each time and has not been able to keep it down. Reports feeling febrile again, has taken tylenol  and ibuprofen .

## 2024-04-16 NOTE — ED Notes (Signed)
 Patient tolerating fluids and expressed improved symptoms and readiness to be discharged

## 2024-04-16 NOTE — ED Notes (Signed)
 Fluid finished, nausea decreased. Patient given water for PO challenge. Provider notified

## 2024-04-16 NOTE — Discharge Instructions (Signed)
 Evaluation today was reassuring.  It appears that your strep throat has resolved.  You can discontinue clindamycin  since you have effectively treated with azithromycin .  I sent Zofran  to your pharmacy to help with nausea and vomiting but you clinically did not appear to be dehydrated at this time.  I would recommend however that you continue assertive hydration with water and Gatorade at home.  Please follow-up with your PCP.  If you develop abdominal pain, fever, worsening sore throat or trouble breathing or any other concerning symptom please return to the ED for further evaluation.

## 2024-04-21 NOTE — Telephone Encounter (Signed)
 Echo order entered. Sent message to pt regarding scheduling an appointment, once she has her Echo date.

## 2024-04-23 ENCOUNTER — Other Ambulatory Visit: Payer: Self-pay

## 2024-04-23 ENCOUNTER — Other Ambulatory Visit (HOSPITAL_BASED_OUTPATIENT_CLINIC_OR_DEPARTMENT_OTHER): Payer: Self-pay

## 2024-04-23 ENCOUNTER — Other Ambulatory Visit (HOSPITAL_COMMUNITY): Payer: Self-pay

## 2024-04-26 ENCOUNTER — Other Ambulatory Visit (HOSPITAL_COMMUNITY): Payer: Self-pay

## 2024-05-03 ENCOUNTER — Encounter

## 2024-05-03 ENCOUNTER — Telehealth: Admitting: Physician Assistant

## 2024-05-03 DIAGNOSIS — R202 Paresthesia of skin: Secondary | ICD-10-CM

## 2024-05-03 NOTE — Progress Notes (Signed)
 Virtual Visit Consent   Cynthia Townsend, you are scheduled for a virtual visit with a [facility] provider today. Just as with appointments in the office, your consent must be obtained to participate. Your consent will be active for this visit and any virtual visit you may have with one of our providers in the next 365 days. If you have a MyChart account, a copy of this consent can be sent to you electronically.  As this is a virtual visit, video technology does not allow for your provider to perform a traditional examination. This may limit your provider's ability to fully assess your condition. If your provider identifies any concerns that need to be evaluated in person or the need to arrange testing (such as labs, EKG, etc.), we will make arrangements to do so. Although advances in technology are sophisticated, we cannot ensure that it will always work on either your end or our end. If the connection with a video visit is poor, the visit may have to be switched to a telephone visit. With either a video or telephone visit, we are not always able to ensure that we have a secure connection.  By engaging in this virtual visit, you consent to the provision of healthcare and authorize for your insurance to be billed (if applicable) for the services provided during this visit. Depending on your insurance coverage, you may receive a charge related to this service.  I need to obtain your verbal consent now. Are you willing to proceed with your visit today? Cynthia Townsend has provided verbal consent on 05/03/2024 for a virtual visit (video or telephone). Cynthia CHRISTELLA Dickinson, PA-C  Date: 05/03/2024 5:03 PM   Virtual Visit via Video Note   I, Cynthia Townsend, connected with  Cynthia Townsend  ([id number], Oct 22, 1980) on 05/03/24 at  4:15 PM EST by a video-enabled telemedicine application and verified that I am speaking with the correct person using two identifiers.  Location: Patient: Virtual Visit  Location Patient: Home Provider: Virtual Visit Location Provider: Home Office   I discussed the limitations of evaluation and management by telemedicine and the availability of in person appointments. The patient expressed understanding and agreed to proceed.    History of Present Illness: Cynthia Townsend is a 43 y.o. who identifies as a female who was assigned female at birth, and is being seen today for bilateral tingling in the arms. Started in the right hand/fingers, then progressed into the right medial forearm. Then started to notice in the left and progressed the same. Denies any true numbness or weakness.   Does have known anxiety so thought this may be contributing. Took her alprazolam  that she has for this. No change in symptoms. Also, denies any panic symptoms that normally also accompany these symptoms.   Denies any recent vaccinations.    Problems:  Patient Active Problem List   Diagnosis Date Noted   Bipolar 1 disorder, mixed, moderate (HCC) 01/31/2023   MDD (major depressive disorder), recurrent severe, without psychosis (HCC) 01/31/2023   Leg swelling 01/31/2023   Acute on chronic diastolic CHF (congestive heart failure) (HCC) 01/29/2023   Suicidal ideations 01/28/2023   Gestational hypertension 07/16/2017   HTN (hypertension) 07/15/2017   Hypokalemia 06/26/2017   Hyperglycemia 06/26/2017   Fatigue 04/28/2017   Mild persistent asthma 04/01/2017   Chronic rhinitis 04/01/2017   Pregnant and not yet delivered, second trimester 04/01/2017   Cellulitis of finger, left 03/25/2017   Dog bite 03/25/2017   Allergy 02/25/2017   Nasal turbinate  hypertrophy 02/25/2017   Acute asthma flare 01/28/2017   Recurrent infections 01/22/2017   Acute pulmonary edema (HCC) 09/03/2016   Cough 04/03/2016   Sleep disturbance 02/29/2016   Classic migraine with aura 09/20/2015   Shortness of breath 09/04/2015   PIH (pregnancy induced hypertension) 08/31/2015   Tenosynovitis of thumb  04/06/2015   Irregular menstrual cycle 12/29/2014   ADD (attention deficit disorder) 12/29/2014   Vitamin D  deficiency 12/29/2014   Generalized anxiety disorder 12/29/2014   ADHD (attention deficit hyperactivity disorder) 11/20/2011   Anemia 11/20/2011   Hypothyroidism 07/31/2011   Depression 07/31/2011   Breast pain 07/16/2011    Allergies:  Allergies  Allergen Reactions   Bee Venom Anaphylaxis   Penicillins Shortness Of Breath, Swelling, Rash and Other (See Comments)   Sulfa Antibiotics Hives and Rash   Medications:  Current Outpatient Medications:    albuterol  (VENTOLIN  HFA) 108 (90 Base) MCG/ACT inhaler, Inhale 2 puffs into the lungs every 6 (six) hours as needed for wheezing, Disp: 6.7 g, Rfl: 1   albuterol  (VENTOLIN  HFA) 108 (90 Base) MCG/ACT inhaler, Inhale 2 puffs into the lungs every 4 (four) hours as needed for wheezing or shortness of breath., Disp: 18 g, Rfl: 5   ALPRAZolam  (XANAX ) 0.5 MG tablet, Take 0.5 mg by mouth 3 (three) times daily., Disp: , Rfl:    cetirizine (ZYRTEC) 10 MG tablet, Take 10 mg by mouth at bedtime as needed for allergies., Disp: , Rfl:    clindamycin  (CLEOCIN ) 300 MG capsule, Take 300 mg by mouth every 8 (eight) hours., Disp: , Rfl:    clotrimazole-betamethasone  (LOTRISONE) cream, Apply 1 Application topically 2 (two) times daily., Disp: , Rfl:    dapagliflozin  propanediol (FARXIGA ) 10 MG TABS tablet, Take 1 tablet (10 mg total) by mouth daily before breakfast., Disp: 90 tablet, Rfl: 0   furosemide  (LASIX ) 40 MG tablet, Take 1 tablet (40 mg total) by mouth 2 (two) times daily. Follow with cardiology or PCP for refills, Disp: 60 tablet, Rfl: 0   ibuprofen  (ADVIL ) 600 MG tablet, Take 1 tablet (600 mg total) by mouth every 8 (eight) hours as needed., Disp: 30 tablet, Rfl: 0   levonorgestrel (MIRENA) 20 MCG/DAY IUD, by Intrauterine route., Disp: , Rfl:    levothyroxine  (SYNTHROID ) 200 MCG tablet, Take 1 tablet (200 mcg total) by mouth every morning., Disp:  90 tablet, Rfl: 1   levothyroxine  (SYNTHROID ) 200 MCG tablet, Take 1 tablet (200 mcg total) by mouth in the morning., Disp: 90 tablet, Rfl: 1   lidocaine  (XYLOCAINE ) 2 % solution, Use as directed 10 mLs in the mouth or throat 3 (three) times daily as needed., Disp: , Rfl:    lisinopril  (ZESTRIL ) 2.5 MG tablet, Take 2.5 mg by mouth daily., Disp: , Rfl:    lisinopril  (ZESTRIL ) 2.5 MG tablet, Take 1 tablet (2.5 mg total) by mouth daily., Disp: 30 tablet, Rfl: 5   metoprolol  succinate (TOPROL -XL) 50 MG 24 hr tablet, Take 1 tablet (50 mg total) by mouth daily. Take with or immediately following a meal., Disp: 90 tablet, Rfl: 0   mirtazapine  (REMERON ) 45 MG tablet, Take 1 tablet (45 mg total) by mouth at bedtime., Disp: 90 tablet, Rfl: 0   MOUNJARO  12.5 MG/0.5ML Pen, Inject 12.5 mg into the skin once a week., Disp: 2 mL, Rfl: 2   omeprazole  (PRILOSEC) 40 MG capsule, Take 40 mg by mouth daily., Disp: , Rfl:    omeprazole  (PRILOSEC) 40 MG capsule, Take 1 capsule (40 mg total) by mouth  daily., Disp: 30 capsule, Rfl: 5   ondansetron  (ZOFRAN ) 4 MG tablet, Take 1 tablet (4 mg total) by mouth every 6 (six) hours., Disp: 12 tablet, Rfl: 0   potassium chloride  SA (KLOR-CON  M) 20 MEQ tablet, Take 1 tablet (20 mEq total) by mouth 2 (two) times daily. Twice a day with lasix .  If you take an extra dose of lasix , take an extra dose of potassium.  Follow with cardiology or your PCP for refills, Disp: 60 tablet, Rfl: 0   spironolactone  (ALDACTONE ) 25 MG tablet, Take 1 tablet (25 mg total) by mouth daily., Disp: 90 tablet, Rfl: 0   SYMBICORT  160-4.5 MCG/ACT inhaler, Inhale 2 puffs into the lungs daily as needed (wheezing, shortness of breath)., Disp: , Rfl:    tirzepatide  (MOUNJARO ) 12.5 MG/0.5ML Pen, Inject 12.5 mg into the skin once a week., Disp: 2 mL, Rfl: 2   traZODone  (DESYREL ) 100 MG tablet, Take 2 tablets (200 mg total) by mouth at bedtime., Disp: 60 tablet, Rfl: 0  Observations/Objective: Patient is  well-developed, well-nourished in no acute distress.  Resting comfortably  Head is normocephalic, atraumatic.  No labored breathing.  Speech is clear and coherent with logical content.  Patient is alert and oriented at baseline.  Negative Phalen's Test, Negative Finklestein test, Negative Tinel sign  Assessment and Plan: 1. Paresthesia of arm (Primary)  - Possibly from cubital syndrome or inflammation around the ulnar nerve bilaterally - Advised can trial Meloxicam 7.5mg  for a few days to see if this helps (use short term based on medical history, less than a week of consecutive use); does have on hand at home - If not improving advised to follow up in person for a more thorough exam and lab work as vitamin deficiencies and her known thyroid  disease (s/p thyroidectomy) can contribute  Follow Up Instructions: I discussed the assessment and treatment plan with the patient. The patient was provided an opportunity to ask questions and all were answered. The patient agreed with the plan and demonstrated an understanding of the instructions.  A copy of instructions were sent to the patient via MyChart unless otherwise noted below.    The patient was advised to call back or seek an in-person evaluation if the symptoms worsen or if the condition fails to improve as anticipated.    Cynthia CHRISTELLA Dickinson, PA-C

## 2024-05-03 NOTE — Patient Instructions (Signed)
 Cynthia Townsend, thank you for joining [name], PA-C for today's virtual visit.  While this provider is not your primary care provider (PCP), if your PCP is located in our provider database this encounter information will be shared with them immediately following your visit.   A [facility] MyChart account gives you access to today's visit and all your visits, tests, and labs performed at [facility]  click here if you don't have a [facility] MyChart account or go to [email or web address]  Consent: (Patient) Cynthia Townsend provided verbal consent for this virtual visit at the beginning of the encounter.  Current Medications:  Current Outpatient Medications:    albuterol  (VENTOLIN  HFA) 108 (90 Base) MCG/ACT inhaler, Inhale 2 puffs into the lungs every 6 (six) hours as needed for wheezing, Disp: 6.7 g, Rfl: 1   albuterol  (VENTOLIN  HFA) 108 (90 Base) MCG/ACT inhaler, Inhale 2 puffs into the lungs every 4 (four) hours as needed for wheezing or shortness of breath., Disp: 18 g, Rfl: 5   ALPRAZolam  (XANAX ) 0.5 MG tablet, Take 0.5 mg by mouth 3 (three) times daily., Disp: , Rfl:    cetirizine (ZYRTEC) 10 MG tablet, Take 10 mg by mouth at bedtime as needed for allergies., Disp: , Rfl:    clindamycin  (CLEOCIN ) 300 MG capsule, Take 300 mg by mouth every 8 (eight) hours., Disp: , Rfl:    clotrimazole-betamethasone  (LOTRISONE) cream, Apply 1 Application topically 2 (two) times daily., Disp: , Rfl:    dapagliflozin  propanediol (FARXIGA ) 10 MG TABS tablet, Take 1 tablet (10 mg total) by mouth daily before breakfast., Disp: 90 tablet, Rfl: 0   furosemide  (LASIX ) 40 MG tablet, Take 1 tablet (40 mg total) by mouth 2 (two) times daily. Follow with cardiology or PCP for refills, Disp: 60 tablet, Rfl: 0   ibuprofen  (ADVIL ) 600 MG tablet, Take 1 tablet (600 mg total) by mouth every 8 (eight) hours as needed., Disp: 30 tablet, Rfl: 0   levonorgestrel (MIRENA) 20 MCG/DAY IUD, by  Intrauterine route., Disp: , Rfl:    levothyroxine  (SYNTHROID ) 200 MCG tablet, Take 1 tablet (200 mcg total) by mouth every morning., Disp: 90 tablet, Rfl: 1   levothyroxine  (SYNTHROID ) 200 MCG tablet, Take 1 tablet (200 mcg total) by mouth in the morning., Disp: 90 tablet, Rfl: 1   lidocaine  (XYLOCAINE ) 2 % solution, Use as directed 10 mLs in the mouth or throat 3 (three) times daily as needed., Disp: , Rfl:    lisinopril  (ZESTRIL ) 2.5 MG tablet, Take 2.5 mg by mouth daily., Disp: , Rfl:    lisinopril  (ZESTRIL ) 2.5 MG tablet, Take 1 tablet (2.5 mg total) by mouth daily., Disp: 30 tablet, Rfl: 5   metoprolol  succinate (TOPROL -XL) 50 MG 24 hr tablet, Take 1 tablet (50 mg total) by mouth daily. Take with or immediately following a meal., Disp: 90 tablet, Rfl: 0   mirtazapine  (REMERON ) 45 MG tablet, Take 1 tablet (45 mg total) by mouth at bedtime., Disp: 90 tablet, Rfl: 0   MOUNJARO  12.5 MG/0.5ML Pen, Inject 12.5 mg into the skin once a week., Disp: 2 mL, Rfl: 2   omeprazole  (PRILOSEC) 40 MG capsule, Take 40 mg by mouth daily., Disp: , Rfl:    omeprazole  (PRILOSEC) 40 MG capsule, Take 1 capsule (40 mg total) by mouth daily., Disp: 30 capsule, Rfl: 5   ondansetron  (ZOFRAN ) 4 MG tablet, Take 1 tablet (4 mg total) by mouth every 6 (six) hours., Disp: 12 tablet, Rfl: 0   potassium chloride  SA (  KLOR-CON  M) 20 MEQ tablet, Take 1 tablet (20 mEq total) by mouth 2 (two) times daily. Twice a day with lasix .  If you take an extra dose of lasix , take an extra dose of potassium.  Follow with cardiology or your PCP for refills, Disp: 60 tablet, Rfl: 0   spironolactone  (ALDACTONE ) 25 MG tablet, Take 1 tablet (25 mg total) by mouth daily., Disp: 90 tablet, Rfl: 0   SYMBICORT  160-4.5 MCG/ACT inhaler, Inhale 2 puffs into the lungs daily as needed (wheezing, shortness of breath)., Disp: , Rfl:    tirzepatide  (MOUNJARO ) 12.5 MG/0.5ML Pen, Inject 12.5 mg into the skin once a week., Disp: 2 mL, Rfl: 2   traZODone  (DESYREL )  100 MG tablet, Take 2 tablets (200 mg total) by mouth at bedtime., Disp: 60 tablet, Rfl: 0   Medications ordered in this encounter:  No orders of the defined types were placed in this encounter.    *If you need refills on other medications prior to your next appointment, please contact your pharmacy*  Follow-Up: Call back or seek an in-person evaluation if the symptoms worsen or if the condition fails to improve as anticipated.  [facility] [phone]  Other Instructions Paresthesia Paresthesia is an abnormal burning or prickling sensation. It is usually felt in the hands, arms, legs, or feet. However, it may occur in any part of the body. Usually, paresthesia is not painful. It may feel like: Tingling or numbness. Buzzing. Itching. Paresthesia may occur without any clear cause, or it may be caused by: Breathing too quickly (hyperventilation). Pressure on a nerve. An underlying medical condition. Side effects of a medicine. Nutritional deficiencies. Exposure to toxic chemicals. Most people experience temporary (transient) paresthesia at some time in their lives. For some people, it may be long-lasting (chronic) because of an underlying medical condition. If you have paresthesia that lasts a long time, you need to be evaluated by your health care provider. Follow these instructions at home: Nutrition Eat a healthy diet. This includes: Eating foods that are high in fiber, such as beans, whole grains, and fresh fruits and vegetables. Limiting foods that are high in fat and processed sugars, such as fried or sweet foods.  Alcohol use  Avoid or limit alcohol. Too much alcohol can cause a vitamin B deficiency, and vitamin B is needed for healthy nerves. Do not drink alcohol if: Your health care provider tells you not to drink. You are pregnant, may be pregnant, or are planning to become pregnant. If you drink alcohol: Limit how much you have to: 0-1 drink a day for  women. 0-2 drinks a day for men. Know how much alcohol is in your drink. In the [place] one drink equals one 12 oz bottle of beer (355 mL), one 5 oz glass of wine (148 mL), or one 1 oz glass of hard liquor (44 mL). General instructions Take over-the-counter and prescription medicines only as told by your health care provider. Do not use any products that contain nicotine or tobacco. These products include cigarettes, chewing tobacco, and vaping devices, such as e-cigarettes. If you need help quitting, ask your health care provider. If you have diabetes, work closely with your health care provider to keep your blood sugar under control. If you have numbness in your feet: Check every day for signs of injury or infection. Watch for redness, warmth, and swelling. Wear padded socks and comfortable shoes. These help protect your feet. Keep all follow-up visits. This is important. Contact a  health care provider if you: Have paresthesia that gets worse or does not go away. Have numbness after an injury. Have a burning or prickling feeling that gets worse when you walk. Have pain, cramps, or dizziness, or you faint. Develop a rash. Get help right away if you: Feel muscle weakness. Develop new weakness in an arm or leg. Have trouble walking or moving. Have problems with speech, understanding, or vision. Feel confused. Cannot control your bladder or bowel movements. These symptoms may be an emergency. Get help right away. Call 911. Do not wait to see if the symptoms will go away. Do not drive yourself to the hospital. Summary Paresthesia is an abnormal burning or prickling sensation that is usually felt in the hands, arms, legs, or feet. It may also occur in other parts of the body. Paresthesia may occur without any clear cause, or it may be caused by breathing too quickly (hyperventilation), pressure on a nerve, an underlying medical condition, side effects of a medicine, nutritional deficiencies,  or exposure to toxic chemicals. If you have paresthesia that lasts a long time, you need to be evaluated by your health care provider. This information is not intended to replace advice given to you by your health care provider. Make sure you discuss any questions you have with your health care provider. Document Revised: 02/05/2021 Document Reviewed: 02/05/2021 Elsevier Patient Education  2024 Elsevier Inc.   If you have been instructed to have an in-person evaluation today at a local Urgent Care facility, please use the link below. It will take you to a list of all of our available [facility], including address, phone number and hours of operation. Please do not delay care.  [facility]  If you or a family member do not have a primary care provider, use the link below to schedule a visit and establish care. When you choose a [facility] primary care physician or advanced practice provider, you gain a long-term partner in health. Find a Primary Care Provider  Learn more about [facility] in-office and virtual care options: [facility] Get Care Now

## 2024-05-21 ENCOUNTER — Other Ambulatory Visit (INDEPENDENT_AMBULATORY_CARE_PROVIDER_SITE_OTHER)

## 2024-05-21 DIAGNOSIS — R600 Localized edema: Secondary | ICD-10-CM | POA: Diagnosis not present

## 2024-05-21 DIAGNOSIS — R0602 Shortness of breath: Secondary | ICD-10-CM

## 2024-05-21 LAB — ECHOCARDIOGRAM COMPLETE
AR max vel: 2.82 cm2
AV Area VTI: 2.91 cm2
AV Area mean vel: 2.4 cm2
AV Mean grad: 6 mmHg
AV Peak grad: 9.1 mmHg
Ao pk vel: 1.51 m/s
Area-P 1/2: 4.01 cm2
S' Lateral: 3.54 cm

## 2024-05-26 ENCOUNTER — Other Ambulatory Visit: Payer: Self-pay

## 2024-05-26 ENCOUNTER — Other Ambulatory Visit (HOSPITAL_COMMUNITY): Payer: Self-pay

## 2024-05-26 MED ORDER — MOUNJARO 12.5 MG/0.5ML ~~LOC~~ SOAJ
12.5000 mg | SUBCUTANEOUS | 1 refills | Status: AC
  Filled 2024-05-26: qty 6, 84d supply, fill #0

## 2024-06-01 ENCOUNTER — Telehealth: Admitting: Physician Assistant

## 2024-06-01 DIAGNOSIS — J069 Acute upper respiratory infection, unspecified: Secondary | ICD-10-CM

## 2024-06-01 MED ORDER — FLUTICASONE PROPIONATE 50 MCG/ACT NA SUSP: 2.0000 | Freq: Every day | NASAL | 0 refills | Status: AC

## 2024-06-01 MED ORDER — PROMETHAZINE-DM 6.25-15 MG/5ML PO SYRP: 5.0000 mL | ORAL_SOLUTION | Freq: Four times a day (QID) | ORAL | 0 refills | Status: AC | PRN

## 2024-06-01 MED ORDER — NAPROXEN 500 MG PO TABS: 500.0000 mg | ORAL_TABLET | Freq: Two times a day (BID) | ORAL | 0 refills | Status: AC

## 2024-06-01 NOTE — Progress Notes (Signed)
 E-Visit for COVID/Influenza Screening  Your current symptoms could be consistent with COVID-19 or Influenza. Please complete a COVID + Flu Test at home, or check with your local pharmacy to see if they provide testing.   If you have tested positive for either COVID or Influenza, it means that you were infected with that particular virus and could give the virus to others.  Most people with these infections have a mild illness and can recover at home without medical care. Do not leave your home, except to get medical care.  DO not visit public areas and do not go to places where you are unable to wear a mask. It is important for you to stay home to take care of yourself and to help protect other people in your home and community.   Isolation Instructions:  You are to isolate at home for now until you have taken your home COVID/Flu test and notified our team of your results, at which time further isolation instructions will be given.  If you must be around other household members who do not have symptoms, you need to make sure that both you and the family members are masking consistently with a high-quality mask, even while in the home.  If you note any worsening of symptoms despite treatment, please seek an IN-PERSON evaluation ASAP. If you note any significant shortness of breath or any chest pain, please seek immediate ER evaluation. Please do not delay care!  Go to the nearest hospital ED for assessment if fever/cough/breathlessness are severe or illness seems like a threat to life.    The following symptoms may appear 2-14 days after exposure: Fever Cough Shortness of breath or difficulty breathing Chills Repeated shaking with chills Muscle pain Headache Sore throat New loss of taste or smell Fatigue Congestion or runny nose Nausea or vomiting Diarrhea   For symptoms,  I have prescribed Phenergan DM 6.25 mg/15 mg. You make take one teaspoon / 5 ml every 4-6 hours as needed for  cough, I have prescribed an anti-inflammatory - Naprosyn 500 mg. Take twice daily as needed for fever or body aches for 2 weeks, and I have prescribed Fluticasone nasal spray 2 sprays in each nostril one time per dayasal spray 2 sprays in each nostril one time per day You may also take acetaminophen (Tylenol) as needed for fever.   Reduce your risk of any infection by using the same precautions used for avoiding the common cold or flu:  Wash your hands often with soap and warm water for at least 20 seconds.  If soap and water are not readily available, use an alcohol-based hand sanitizer with at least 60% alcohol.  If coughing or sneezing, cover your mouth and nose by coughing or sneezing into the elbow areas of your shirt or coat, into a tissue or into your sleeve (not your hands). Avoid shaking hands with others and consider head nods or verbal greetings only. Avoid touching your eyes, nose, or mouth with unwashed hands.  Avoid close contact with people who are sick. Avoid places or events with large numbers of people in one location, like concerts or sporting events. Carefully consider travel plans you have or are making. If you are planning any travel outside or inside the [place] , visit the CDC's Travelers' Health webpage for the latest health notices. If you have some symptoms but not all symptoms, continue to monitor at home and seek medical attention if your symptoms worsen. If you are having a medical emergency,  call 911.  HOME CARE Only take medications as instructed by your medical team. Drink plenty of fluids and get plenty of rest. A steam or ultrasonic humidifier can help if you have congestion.   GET HELP RIGHT AWAY IF YOU HAVE EMERGENCY WARNING SIGNS** FOR COVID-19. If you or someone is showing any of these signs seek emergency medical care immediately. Call 911 or proceed to your closest emergency facility if: You develop worsening high fever. Trouble breathing. Bluish lips or  face. Persistent pain or pressure in the chest. New confusion. Inability to wake or stay awake. You cough up blood. Your symptoms become more severe.  **This list is not all possible symptoms. Contact your medical provider for any symptoms that are sever or concerning to you.   MAKE SURE YOU  Understand these instructions. Will watch your condition. Will get help right away if you are not doing well or get worse.  Your e-visit answers were reviewed by a board certified advanced clinical practitioner to complete your personal care plan.  Depending on the condition, your plan could have included both over the counter or prescription medications.  If there is a problem, please reply once you have received a response from your provider.  Your safety is important to us .  If you have drug allergies check your prescription carefully.    You can use MyChart to ask questions about today's visit, request a non-urgent call back, or ask for a work or school excuse for 24 hours related to this e-Visit. If it has been greater than 24 hours you will need to follow up with your provider, or enter a new e-Visit to address those concerns. You will get an e-mail in the next two days asking about your experience.  I hope that your e-visit has been valuable and will speed your recovery. Thank you for using e-visits.   I have spent 5 minutes in review of e-visit questionnaire, review and updating patient chart, medical decision making and response to patient.   Cynthia CHRISTELLA Dickinson, PA-C

## 2024-06-02 ENCOUNTER — Telehealth: Admitting: Physician Assistant

## 2024-06-02 DIAGNOSIS — J101 Influenza due to other identified influenza virus with other respiratory manifestations: Secondary | ICD-10-CM

## 2024-06-02 MED ORDER — OSELTAMIVIR PHOSPHATE 75 MG PO CAPS: 75.0000 mg | ORAL_CAPSULE | Freq: Two times a day (BID) | ORAL | 0 refills | Status: DC

## 2024-06-02 MED ORDER — OSELTAMIVIR PHOSPHATE 75 MG PO CAPS: 75.0000 mg | ORAL_CAPSULE | Freq: Two times a day (BID) | ORAL | 0 refills | Status: AC

## 2024-06-02 NOTE — Addendum Note (Signed)
 Addended by: GLADIS ELSIE BROCKS on: 06/02/2024 06:17 PM   Modules accepted: Orders

## 2024-06-02 NOTE — Progress Notes (Signed)
 E visit for Flu like symptoms   We are sorry that you are not feeling well.  Here is how we plan to help! Based on what you have shared with me it looks like you may have a respiratory virus that may be influenza.  Influenza or the flu is  an infection caused by a respiratory virus. The flu virus is highly contagious and persons who did not receive their yearly flu vaccination may catch the flu from close contact.  We have anti-viral medications to treat the viruses that cause this infection. They are not a cure and only shorten the course of the infection. These prescriptions are most effective when they are given within the first 2 days of flu symptoms. Antiviral medications are indicated if you have a high risk of complications from the flu. You should  also consider an antiviral medication if you are in close contact with someone who is at risk. These medications can help patients avoid complications from the flu but have side effects that you should know.   Possible side effects from Tamiflu  or oseltamivir  include nausea, vomiting, diarrhea, dizziness, headaches, eye redness, sleep problems or other respiratory symptoms. You should not take Tamiflu  if you have an allergy to oseltamivir  or any to the ingredients in Tamiflu .  Based upon your symptoms and potential risk factors I have prescribed Oseltamivir  (Tamiflu ).  It has been sent to your designated pharmacy.  You will take one 75 mg capsule orally twice a day for the next 5 days.   For nasal congestion, you may use an oral decongestant such as Mucinex  D or if you have glaucoma or high blood pressure use plain Mucinex .  Saline nasal spray or nasal drops can help and can safely be used as often as needed for congestion.  If you have a sore or scratchy throat, use a saltwater gargle-  to  teaspoon of salt dissolved in a 4-ounce to 8-ounce glass of warm water.  Gargle the solution for approximately 15-30 seconds and then spit.  It is  important not to swallow the solution.  You can also use throat lozenges/cough drops and Chloraseptic spray to help with throat pain or discomfort.  Warm or cold liquids can also be helpful in relieving throat pain.  For headache, pain or general discomfort, you can use Ibuprofen  or Tylenol  as directed.   Some authorities believe that zinc sprays or the use of Echinacea may shorten the course of your symptoms.  Please take the previously prescribed medications as directed.  You are to isolate at home until you have been fever-free for at least 24 hours without a fever-reducing medication, and symptoms have been steadily improving for 24 hours.  If you must be around other household members who do not have symptoms, you need to make sure that both you and the family members are masking consistently with a high-quality mask.  If you note any worsening of symptoms despite treatment, please seek an in-person evaluation ASAP. If you note any significant shortness of breath or any chest pain, please seek ED evaluation. Please do not delay care!  For family -- I would recommend quarantine from them since they have no symptoms. I would encourage masking when you have to be around each other. Keep hands washed and common surfaces clean with Lysol.  We cannot treat family members via e-visit, so if they become symptomatic, I would recommend that you reach out to their Pediatrician (in case of kids), or for your husband,  to schedule a MyChart visit with our team.  ANYONE WHO HAS FLU SYMPTOMS SHOULD: Stay home. The flu is highly contagious and going out or to work exposes others! Be sure to drink plenty of fluids. Water is fine as well as fruit juices, sodas and electrolyte beverages. You may want to stay away from caffeine  or alcohol. If you are nauseated, try taking small sips of liquids. How do you know if you are getting enough fluid? Your urine should be a pale yellow or almost colorless. Get rest. Taking  a steamy shower or using a humidifier may help nasal congestion and ease sore throat pain. Using a saline nasal spray works much the same way. Cough drops, hard candies and sore throat lozenges may ease your cough. Line up a caregiver. Have someone check on you regularly.  GET HELP RIGHT AWAY IF: You cannot keep down liquids or your medications. You become short of breath Your fell like you are going to pass out or loose consciousness. Your symptoms persist after you have completed your treatment plan  MAKE SURE YOU  Understand these instructions. Will watch your condition. Will get help right away if you are not doing well or get worse.  Your e-visit answers were reviewed by a board certified advanced clinical practitioner to complete your personal care plan.  Depending on the condition, your plan could have included both over the counter or prescription medications.  If there is a problem please reply  once you have received a response from your provider.  Your safety is important to us .  If you have drug allergies check your prescription carefully.    You can use MyChart to ask questions about todays visit, request a non-urgent call back, or ask for a work or school excuse for 24 hours related to this e-Visit. If it has been greater than 24 hours you will need to follow up with your provider, or enter a new e-Visit to address those concerns.  You will get an e-mail in the next two days asking about your experience.  I hope that your e-visit has been valuable and will speed your recovery. Thank you for using e-visits.   I have spent 5 minutes in review of e-visit questionnaire, review and updating patient chart, medical decision making and response to patient.   Elsie Velma Lunger, PA-C

## 2024-06-04 ENCOUNTER — Emergency Department (HOSPITAL_BASED_OUTPATIENT_CLINIC_OR_DEPARTMENT_OTHER)
Admission: EM | Admit: 2024-06-04 | Discharge: 2024-06-04 | Disposition: A | Discharge status: Home / Self Care | Attending: Emergency Medicine | Admitting: Emergency Medicine

## 2024-06-04 ENCOUNTER — Other Ambulatory Visit: Payer: Self-pay

## 2024-06-04 ENCOUNTER — Encounter (HOSPITAL_BASED_OUTPATIENT_CLINIC_OR_DEPARTMENT_OTHER): Payer: Self-pay

## 2024-06-04 ENCOUNTER — Emergency Department (HOSPITAL_BASED_OUTPATIENT_CLINIC_OR_DEPARTMENT_OTHER): Admitting: Radiology

## 2024-06-04 DIAGNOSIS — E039 Hypothyroidism, unspecified: Secondary | ICD-10-CM | POA: Diagnosis not present

## 2024-06-04 DIAGNOSIS — J45909 Unspecified asthma, uncomplicated: Secondary | ICD-10-CM | POA: Diagnosis not present

## 2024-06-04 DIAGNOSIS — R062 Wheezing: Secondary | ICD-10-CM

## 2024-06-04 DIAGNOSIS — R051 Acute cough: Secondary | ICD-10-CM

## 2024-06-04 DIAGNOSIS — J101 Influenza due to other identified influenza virus with other respiratory manifestations: Secondary | ICD-10-CM | POA: Diagnosis not present

## 2024-06-04 DIAGNOSIS — I509 Heart failure, unspecified: Secondary | ICD-10-CM | POA: Diagnosis not present

## 2024-06-04 LAB — BASIC METABOLIC PANEL WITH GFR
Anion gap: 12 (ref 5–15)
BUN: 13 mg/dL (ref 6–20)
CO2: 24 mmol/L (ref 22–32)
Calcium: 8.9 mg/dL (ref 8.9–10.3)
Chloride: 104 mmol/L (ref 98–111)
Creatinine, Ser: 0.43 mg/dL — ABNORMAL LOW (ref 0.44–1.00)
GFR, Estimated: >60 mL/min
Glucose, Bld: 116 mg/dL — ABNORMAL HIGH (ref 70–99)
Potassium: 3.4 mmol/L — ABNORMAL LOW (ref 3.5–5.1)
Sodium: 139 mmol/L (ref 135–145)

## 2024-06-04 LAB — CBC
HCT: 39.7 % (ref 36.0–46.0)
Hemoglobin: 13.2 g/dL (ref 12.0–15.0)
MCH: 28.6 pg (ref 26.0–34.0)
MCHC: 33.2 g/dL (ref 30.0–36.0)
MCV: 85.9 fL (ref 80.0–100.0)
Platelets: 312 K/uL (ref 150–400)
RBC: 4.62 MIL/uL (ref 3.87–5.11)
RDW: 13.6 % (ref 11.5–15.5)
WBC: 5.6 K/uL (ref 4.0–10.5)
nRBC: 0 % (ref 0.0–0.2)

## 2024-06-04 LAB — RESP PANEL BY RT-PCR (RSV, FLU A&B, COVID)  RVPGX2
Influenza A by PCR: POSITIVE — AB
Influenza B by PCR: NEGATIVE
Resp Syncytial Virus by PCR: NEGATIVE
SARS Coronavirus 2 by RT PCR: NEGATIVE

## 2024-06-04 LAB — PREGNANCY, URINE: Preg Test, Ur: NEGATIVE

## 2024-06-04 MED ORDER — PREDNISONE 20 MG PO TABS: 40.0000 mg | ORAL_TABLET | Freq: Every day | ORAL | 0 refills | Status: AC

## 2024-06-04 NOTE — Discharge Instructions (Addendum)
 Your history, exam, and workup today are consistent with influenza A viral illness causing all of your wheezing and short of breath troubles.  Your x-ray did not show pneumonia as you have had in the past which is reassuring and your lungs only had some wheezing.  As the inhaler has not cleared up your wheezing, we agreed to give you a short burst of steroids to help clear this up.  Please rest and stay hydrated and follow-up with your primary doctor.  If any symptoms change or worsen acutely, please return to the nearest emergency department.

## 2024-06-04 NOTE — ED Provider Notes (Signed)
 " [place] EMERGENCY DEPARTMENT AT [facility] Provider Note   CSN: [id number] Arrival date & time: 06/04/24  9250     Patient presents with: Fever and Shortness of Breath   Cynthia Townsend is a 43 y.o. female.   The history is provided by the patient and medical records. No language interpreter was used.  Fever Temp source:  Subjective Severity:  Moderate Onset quality:  Gradual Duration:  4 days Timing:  Intermittent Progression:  Waxing and waning Chronicity:  New Relieved by:  Nothing Worsened by:  Nothing Ineffective treatments:  None tried Associated symptoms: chills, congestion and cough   Associated symptoms: no chest pain, no confusion, no diarrhea, no dysuria, no headaches, no nausea, no rash and no vomiting   Shortness of Breath Severity:  Mild Onset quality:  Gradual Timing:  Intermittent Progression:  Waxing and waning Context: URI   Relieved by:  Nothing Worsened by:  Nothing Associated symptoms: cough, fever, sputum production and wheezing   Associated symptoms: no abdominal pain, no chest pain, no headaches, no neck pain, no rash, no swollen glands and no vomiting   Risk factors: no hx of PE/DVT        Prior to Admission medications  Medication Sig Start Date End Date Taking? Authorizing Provider  albuterol  (VENTOLIN  HFA) 108 (90 Base) MCG/ACT inhaler Inhale 2 puffs into the lungs every 6 (six) hours as needed for wheezing 08/22/21     albuterol  (VENTOLIN  HFA) 108 (90 Base) MCG/ACT inhaler Inhale 2 puffs into the lungs every 4 (four) hours as needed for wheezing or shortness of breath. 06/22/23     ALPRAZolam  (XANAX ) 0.5 MG tablet Take 0.5 mg by mouth 3 (three) times daily.    [provider]  cetirizine (ZYRTEC) 10 MG tablet Take 10 mg by mouth at bedtime as needed for allergies.    [provider]  clotrimazole-betamethasone  (LOTRISONE) cream Apply 1 Application topically 2 (two) times daily.    [provider]   dapagliflozin  propanediol (FARXIGA ) 10 MG TABS tablet Take 1 tablet (10 mg total) by mouth daily before breakfast. 04/14/24   Loni Soyla LABOR, MD  fluticasone  (FLONASE ) 50 MCG/ACT nasal spray Place 2 sprays into both nostrils daily. 06/01/24   Vivienne Delon HERO, PA-C  furosemide  (LASIX ) 40 MG tablet Take 1 tablet (40 mg total) by mouth 2 (two) times daily. Follow with cardiology or PCP for refills 02/02/23 03/04/23  Perri LABOR Meliton Mickey., MD  ibuprofen  (ADVIL ) 600 MG tablet Take 1 tablet (600 mg total) by mouth every 8 (eight) hours as needed. 11/24/23   Butler, Michael C, MD  levonorgestrel (MIRENA) 20 MCG/DAY IUD by Intrauterine route. 12/04/20   [provider]  levothyroxine  (SYNTHROID ) 200 MCG tablet Take 1 tablet (200 mcg total) by mouth every morning. 04/14/24     levothyroxine  (SYNTHROID ) 200 MCG tablet Take 1 tablet (200 mcg total) by mouth in the morning. 01/23/24     lisinopril  (ZESTRIL ) 2.5 MG tablet Take 2.5 mg by mouth daily. 01/06/23 01/06/24  [provider]  lisinopril  (ZESTRIL ) 2.5 MG tablet Take 1 tablet (2.5 mg total) by mouth daily. 01/30/24     metoprolol  succinate (TOPROL -XL) 50 MG 24 hr tablet Take 1 tablet (50 mg total) by mouth daily. Take with or immediately following a meal. 04/14/24 07/13/24  Loni Soyla LABOR, MD  mirtazapine  (REMERON ) 45 MG tablet Take 1 tablet (45 mg total) by mouth at bedtime. 04/14/24     MOUNJARO  12.5 MG/0.5ML Pen Inject 12.5  mg into the skin once a week. 04/14/24     MOUNJARO  12.5 MG/0.5ML Pen Inject 12.5 mg into the skin once a week. 05/26/24     naproxen  (NAPROSYN ) 500 MG tablet Take 1 tablet (500 mg total) by mouth 2 (two) times daily with a meal. 06/01/24   Burnette, Delon HERO, PA-C  omeprazole  (PRILOSEC) 40 MG capsule Take 40 mg by mouth daily. 12/07/22   [provider]  omeprazole  (PRILOSEC) 40 MG capsule Take 1 capsule (40 mg total) by mouth daily. 04/10/24     oseltamivir  (TAMIFLU ) 75 MG capsule Take 1 capsule (75 mg  total) by mouth 2 (two) times daily for 5 days. 06/02/24 06/07/24  Gladis Elsie BROCKS, PA-C  potassium chloride  SA (KLOR-CON  M) 20 MEQ tablet Take 1 tablet (20 mEq total) by mouth 2 (two) times daily. Twice a day with lasix .  If you take an extra dose of lasix , take an extra dose of potassium.  Follow with cardiology or your PCP for refills 02/01/23 03/03/23  Perri DELENA Meliton Mickey., MD  promethazine -dextromethorphan (PROMETHAZINE -DM) 6.25-15 MG/5ML syrup Take 5 mLs by mouth 4 (four) times daily as needed. 06/01/24   Vivienne Delon HERO, PA-C  spironolactone  (ALDACTONE ) 25 MG tablet Take 1 tablet (25 mg total) by mouth daily. 04/14/24   Acharya, Gayatri A, MD  SYMBICORT  160-4.5 MCG/ACT inhaler Inhale 2 puffs into the lungs daily as needed (wheezing, shortness of breath). 10/09/22   [provider]  tirzepatide  (MOUNJARO ) 12.5 MG/0.5ML Pen Inject 12.5 mg into the skin once a week. 03/30/24     traZODone  (DESYREL ) 100 MG tablet Take 2 tablets (200 mg total) by mouth at bedtime. 02/02/23   Evelena Figures, MD    Allergies: Bee venom, Penicillins, Clindamycin /lincomycin, and Sulfa antibiotics    Review of Systems  Constitutional:  Positive for chills and fever.  HENT:  Positive for congestion.   Eyes:  Negative for visual disturbance.  Respiratory:  Positive for cough, sputum production, chest tightness, shortness of breath and wheezing.   Cardiovascular:  Negative for chest pain and palpitations.  Gastrointestinal:  Negative for abdominal pain, constipation, diarrhea, nausea and vomiting.  Genitourinary:  Negative for dysuria.  Musculoskeletal:  Negative for back pain, neck pain and neck stiffness.  Skin:  Negative for rash and wound.  Neurological:  Negative for light-headedness and headaches.  Psychiatric/Behavioral:  Negative for agitation and confusion.   All other systems reviewed and are negative.   Updated Vital Signs BP 128/71 (BP Location: Right Arm)   Pulse 74   Temp 98.4 F (36.9  C) (Oral)   Resp 18   Ht 5' 2 (1.575 m)   Wt 108.9 kg   SpO2 99%   BMI 43.90 kg/m   Physical Exam Vitals and nursing note reviewed.  Constitutional:      General: She is not in acute distress.    Appearance: She is well-developed. She is not ill-appearing, toxic-appearing or diaphoretic.  HENT:     Head: Normocephalic and atraumatic.  Eyes:     Conjunctiva/sclera: Conjunctivae normal.     Pupils: Pupils are equal, round, and reactive to light.  Cardiovascular:     Rate and Rhythm: Normal rate and regular rhythm.     Heart sounds: No murmur heard. Pulmonary:     Effort: Pulmonary effort is normal. No respiratory distress.     Breath sounds: Wheezing present. No rhonchi or rales.  Chest:     Chest wall: No tenderness.  Abdominal:  Palpations: Abdomen is soft.     Tenderness: There is no abdominal tenderness.  Musculoskeletal:        General: No swelling.     Cervical back: Neck supple.     Right lower leg: No edema.     Left lower leg: No edema.  Skin:    General: Skin is warm and dry.     Capillary Refill: Capillary refill takes less than 2 seconds.     Findings: No erythema.  Neurological:     Mental Status: She is alert.  Psychiatric:        Mood and Affect: Mood normal.     (all labs ordered are listed, but only abnormal results are displayed) Labs Reviewed  RESP PANEL BY RT-PCR (RSV, FLU A&B, COVID)  RVPGX2 - Abnormal; Notable for the following components:      Result Value   Influenza A by PCR POSITIVE (*)    All other components within normal limits  BASIC METABOLIC PANEL WITH GFR - Abnormal; Notable for the following components:   Potassium 3.4 (*)    Glucose, Bld 116 (*)    Creatinine, Ser 0.43 (*)    All other components within normal limits  CBC  PREGNANCY, URINE    EKG: EKG Interpretation Date/Time:  Friday June 04 2024 08:50:28 EST Ventricular Rate:  74 PR Interval:  131 QRS Duration:  79 QT Interval:  421 QTC Calculation: 468 R  Axis:   60  Text Interpretation: Sinus rhythm when compared top rior, similar appearance No STEMI Confirmed by Ginger Barefoot ([id number]) on 06/04/2024 9:27:54 AM  Radiology: DG Chest 2 View Result Date: 06/04/2024 CLINICAL DATA:  Shortness of breath EXAM: CHEST - 2 VIEW COMPARISON:  November 24, 2023.  April 29, 2023. FINDINGS: The heart size and mediastinal contours are within normal limits. Both lungs are clear. The visualized skeletal structures are unremarkable. IMPRESSION: No active cardiopulmonary disease. Electronically Signed   By: Lynwood Landy Raddle M.D.   On: 06/04/2024 08:58     Procedures   Medications Ordered in the ED - No data to display                                  Medical Decision Making Amount and/or Complexity of Data Reviewed Labs: ordered. Radiology: ordered.    Cynthia Townsend is a 43 y.o. female with a past medical history significant for anxiety, depression, asthma, CHF, hypothyroidism, and previous ectopic pregnancy who presents with wheezing and viral illness.  According to patient, she tested positive for flu 3 days ago but overnight started having some more wheezing and some chest tightness.  She denies any active chest pain and report only mild shortness of breath.  She reports she is having some pressure and cough and came here to make sure she does not have pneumonia.  She denies nausea, vomiting constipation, or diarrhea symptoms.  She had nausea 3 days ago that has resolved.  Otherwise she is feeling well and has no pain.  She is here to rule out pneumonia.  On exam, lungs had no rhonchi or rales.  She had some faint wheezing.  No murmur.  Chest and abdomen nontender.  Back nontender.  Patient otherwise resting.  Vital signs reassuring on my evaluation and she is not hypoxic.  EKG shows no STEMI.  Patient indeed has flu A as she suspected.  Her workup was negative for pneumonia and labs  otherwise were similar to prior.  We went through all the findings  and patient agrees with plan for discharge home.  As she is on day 4 we agreed to not send in Tamiflu  at this time but due to the wheezing that was not fully helping with her inhaler, she agrees with a short burst dose of steroids and she has inhaler at home.  She says she does not need any more albuterol .  She understands return precaution and follow-up instructions and had no other questions or concerns.  Patient discharged in good condition with reassuring vital signs.         Final diagnoses:  Influenza A  Acute cough  Wheezing    ED Discharge Orders          Ordered    predniSONE  (DELTASONE ) 20 MG tablet  Daily        06/04/24 1020           Clinical Impression: 1. Influenza A   2. Acute cough   3. Wheezing     Disposition: Discharge  Condition: Good  I have discussed the results, Dx and Tx plan with the pt(& family if present). He/she/they expressed understanding and agree(s) with the plan. Discharge instructions discussed at great length. Strict return precautions discussed and pt &/or family have verbalized understanding of the instructions. No further questions at time of discharge.    New Prescriptions   PREDNISONE  (DELTASONE ) 20 MG TABLET    Take 2 tablets (40 mg total) by mouth daily for 5 days.    Follow Up: Reita Locus, MD [address] [place] [address] [phone]     [facility] Emergency Department at [facility] [address] [place]  [address] [phone]        Cynthia Townsend, Lonni PARAS, MD 06/04/24 1027  "

## 2024-06-04 NOTE — ED Triage Notes (Signed)
 Pt reports testing positive for Flu A x3 days ago. Pt reports cough and sob. Pt concerned about pna.

## 2024-06-16 ENCOUNTER — Other Ambulatory Visit (HOSPITAL_COMMUNITY): Payer: Self-pay

## 2024-06-16 MED ORDER — MIRTAZAPINE 30 MG PO TABS
30.0000 mg | ORAL_TABLET | Freq: Every day | ORAL | 0 refills | Status: AC
  Filled 2024-06-16: qty 90, 90d supply, fill #0

## 2024-06-30 ENCOUNTER — Other Ambulatory Visit: Payer: Self-pay

## 2024-06-30 ENCOUNTER — Other Ambulatory Visit (HOSPITAL_BASED_OUTPATIENT_CLINIC_OR_DEPARTMENT_OTHER): Payer: Self-pay

## 2024-06-30 ENCOUNTER — Other Ambulatory Visit (HOSPITAL_COMMUNITY): Payer: Self-pay

## 2024-06-30 MED ORDER — LISINOPRIL 2.5 MG PO TABS
2.5000 mg | ORAL_TABLET | Freq: Every day | ORAL | 5 refills | Status: AC
  Filled 2024-06-30: qty 30, 30d supply, fill #0

## 2024-06-30 MED ORDER — CLOTRIMAZOLE-BETAMETHASONE 1-0.05 % EX CREA
1.0000 | TOPICAL_CREAM | Freq: Two times a day (BID) | CUTANEOUS | 1 refills | Status: AC
  Filled 2024-06-30: qty 45, 30d supply, fill #0

## 2024-06-30 MED ORDER — OMEPRAZOLE 40 MG PO CPDR
40.0000 mg | DELAYED_RELEASE_CAPSULE | Freq: Every day | ORAL | 5 refills | Status: AC
  Filled 2024-06-30: qty 30, 30d supply, fill #0

## 2024-07-01 ENCOUNTER — Other Ambulatory Visit: Payer: Self-pay

## 2024-07-08 ENCOUNTER — Other Ambulatory Visit (HOSPITAL_COMMUNITY): Payer: Self-pay

## 2024-07-08 ENCOUNTER — Other Ambulatory Visit: Payer: Self-pay

## 2024-07-19 ENCOUNTER — Ambulatory Visit: Admitting: Internal Medicine
